# Patient Record
Sex: Female | Born: 1940 | Race: White | Hispanic: No | Marital: Single | Smoking: Never smoker
Health system: Southern US, Community
[De-identification: ages and names within clinical notes are randomized; demographics above are authoritative.]

## PROBLEM LIST (undated history)

## (undated) DIAGNOSIS — D369 Benign neoplasm, unspecified site: Secondary | ICD-10-CM

## (undated) DIAGNOSIS — I499 Cardiac arrhythmia, unspecified: Secondary | ICD-10-CM

## (undated) DIAGNOSIS — IMO0001 Reserved for inherently not codable concepts without codable children: Secondary | ICD-10-CM

## (undated) DIAGNOSIS — Z972 Presence of dental prosthetic device (complete) (partial): Secondary | ICD-10-CM

## (undated) DIAGNOSIS — I10 Essential (primary) hypertension: Secondary | ICD-10-CM

## (undated) DIAGNOSIS — N189 Chronic kidney disease, unspecified: Secondary | ICD-10-CM

## (undated) DIAGNOSIS — F419 Anxiety disorder, unspecified: Secondary | ICD-10-CM

## (undated) DIAGNOSIS — K219 Gastro-esophageal reflux disease without esophagitis: Secondary | ICD-10-CM

## (undated) DIAGNOSIS — I509 Heart failure, unspecified: Secondary | ICD-10-CM

## (undated) DIAGNOSIS — F79 Unspecified intellectual disabilities: Secondary | ICD-10-CM

## (undated) DIAGNOSIS — I4891 Unspecified atrial fibrillation: Secondary | ICD-10-CM

## (undated) DIAGNOSIS — Z973 Presence of spectacles and contact lenses: Secondary | ICD-10-CM

## (undated) DIAGNOSIS — D649 Anemia, unspecified: Secondary | ICD-10-CM

## (undated) DIAGNOSIS — J189 Pneumonia, unspecified organism: Secondary | ICD-10-CM

## (undated) DIAGNOSIS — G2581 Restless legs syndrome: Secondary | ICD-10-CM

## (undated) DIAGNOSIS — E785 Hyperlipidemia, unspecified: Secondary | ICD-10-CM

## (undated) DIAGNOSIS — M1990 Unspecified osteoarthritis, unspecified site: Secondary | ICD-10-CM

## (undated) DIAGNOSIS — I493 Ventricular premature depolarization: Secondary | ICD-10-CM

## (undated) HISTORY — DX: Hyperlipidemia, unspecified: E78.5

## (undated) HISTORY — DX: Anemia, unspecified: D64.9

## (undated) HISTORY — PX: EYE SURGERY: SHX253

## (undated) HISTORY — DX: Unspecified osteoarthritis, unspecified site: M19.90

## (undated) HISTORY — DX: Ventricular premature depolarization: I49.3

## (undated) HISTORY — PX: MULTIPLE TOOTH EXTRACTIONS: SHX2053

## (undated) HISTORY — DX: Unspecified atrial fibrillation: I48.91

## (undated) HISTORY — PX: TONSILLECTOMY: SUR1361

## (undated) HISTORY — DX: Gastro-esophageal reflux disease without esophagitis: K21.9

## (undated) HISTORY — DX: Unspecified intellectual disabilities: F79

## (undated) HISTORY — DX: Benign neoplasm, unspecified site: D36.9

## (undated) HISTORY — PX: JOINT REPLACEMENT: SHX530

---

## 2001-01-17 ENCOUNTER — Inpatient Hospital Stay (HOSPITAL_COMMUNITY)
Admission: RE | Admit: 2001-01-17 | Discharge: 2001-01-21 | Payer: Self-pay | Admitting: Physical Medicine & Rehabilitation

## 2001-07-07 ENCOUNTER — Encounter: Payer: Self-pay | Admitting: Orthopedic Surgery

## 2001-07-07 ENCOUNTER — Inpatient Hospital Stay (HOSPITAL_COMMUNITY): Admission: AD | Admit: 2001-07-07 | Discharge: 2001-07-13 | Payer: Self-pay | Admitting: Orthopedic Surgery

## 2001-07-08 ENCOUNTER — Encounter: Payer: Self-pay | Admitting: Orthopedic Surgery

## 2001-07-12 ENCOUNTER — Encounter: Payer: Self-pay | Admitting: Orthopedic Surgery

## 2002-05-31 ENCOUNTER — Ambulatory Visit (HOSPITAL_COMMUNITY): Admission: RE | Admit: 2002-05-31 | Discharge: 2002-05-31 | Payer: Self-pay | Admitting: Internal Medicine

## 2002-11-08 ENCOUNTER — Encounter: Payer: Self-pay | Admitting: Family Medicine

## 2002-11-08 ENCOUNTER — Ambulatory Visit (HOSPITAL_COMMUNITY): Admission: RE | Admit: 2002-11-08 | Discharge: 2002-11-08 | Payer: Self-pay | Admitting: Family Medicine

## 2003-01-08 ENCOUNTER — Emergency Department (HOSPITAL_COMMUNITY): Admission: EM | Admit: 2003-01-08 | Discharge: 2003-01-09 | Payer: Self-pay | Admitting: *Deleted

## 2003-01-09 ENCOUNTER — Encounter: Payer: Self-pay | Admitting: *Deleted

## 2004-05-30 ENCOUNTER — Ambulatory Visit (HOSPITAL_COMMUNITY): Admission: RE | Admit: 2004-05-30 | Discharge: 2004-05-30 | Payer: Self-pay | Admitting: Internal Medicine

## 2004-11-02 HISTORY — PX: CATARACT EXTRACTION: SUR2

## 2004-11-02 HISTORY — PX: TOTAL HIP ARTHROPLASTY: SHX124

## 2005-05-27 ENCOUNTER — Ambulatory Visit (HOSPITAL_COMMUNITY): Admission: RE | Admit: 2005-05-27 | Discharge: 2005-05-27 | Payer: Self-pay | Admitting: Family Medicine

## 2005-06-11 ENCOUNTER — Ambulatory Visit: Payer: Self-pay | Admitting: Internal Medicine

## 2006-07-28 ENCOUNTER — Ambulatory Visit: Payer: Self-pay | Admitting: Internal Medicine

## 2006-11-24 ENCOUNTER — Ambulatory Visit (HOSPITAL_COMMUNITY): Admission: RE | Admit: 2006-11-24 | Discharge: 2006-11-24 | Payer: Self-pay | Admitting: Family Medicine

## 2008-08-16 ENCOUNTER — Ambulatory Visit (HOSPITAL_COMMUNITY): Admission: RE | Admit: 2008-08-16 | Discharge: 2008-08-16 | Payer: Self-pay | Admitting: Family Medicine

## 2009-06-21 ENCOUNTER — Encounter (INDEPENDENT_AMBULATORY_CARE_PROVIDER_SITE_OTHER): Payer: Self-pay | Admitting: *Deleted

## 2009-06-25 DIAGNOSIS — H409 Unspecified glaucoma: Secondary | ICD-10-CM

## 2009-07-27 ENCOUNTER — Encounter (INDEPENDENT_AMBULATORY_CARE_PROVIDER_SITE_OTHER): Payer: Self-pay | Admitting: *Deleted

## 2009-07-27 LAB — CONVERTED CEMR LAB
ALT: 8 units/L
Alkaline Phosphatase: 64 units/L
Cholesterol: 132 mg/dL
LDL Cholesterol: 75 mg/dL

## 2009-07-30 ENCOUNTER — Encounter (INDEPENDENT_AMBULATORY_CARE_PROVIDER_SITE_OTHER): Payer: Self-pay | Admitting: *Deleted

## 2009-07-30 ENCOUNTER — Ambulatory Visit: Payer: Self-pay | Admitting: Cardiology

## 2009-07-30 DIAGNOSIS — F79 Unspecified intellectual disabilities: Secondary | ICD-10-CM

## 2009-07-30 DIAGNOSIS — I4949 Other premature depolarization: Secondary | ICD-10-CM

## 2009-07-31 ENCOUNTER — Telehealth: Payer: Self-pay | Admitting: Cardiology

## 2009-08-02 ENCOUNTER — Ambulatory Visit (HOSPITAL_COMMUNITY): Admission: RE | Admit: 2009-08-02 | Discharge: 2009-08-02 | Payer: Self-pay | Admitting: Cardiology

## 2009-08-02 ENCOUNTER — Ambulatory Visit: Payer: Self-pay | Admitting: Cardiology

## 2009-08-02 ENCOUNTER — Encounter: Payer: Self-pay | Admitting: Cardiology

## 2009-08-06 LAB — CONVERTED CEMR LAB
Basophils Relative: 1 % (ref 0–1)
Eosinophils Absolute: 0.1 10*3/uL (ref 0.0–0.7)
Eosinophils Relative: 2 % (ref 0–5)
Ferritin: 6 ng/mL — ABNORMAL LOW (ref 10–291)
Folate: 6.4 ng/mL
Iron: 16 ug/dL — ABNORMAL LOW (ref 42–145)
Lymphocytes Relative: 21 % (ref 12–46)
Lymphs Abs: 1.2 10*3/uL (ref 0.7–4.0)
MCHC: 30.8 g/dL (ref 30.0–36.0)
MCV: 74.8 fL — ABNORMAL LOW (ref 78.0–100.0)
Monocytes Absolute: 0.4 10*3/uL (ref 0.1–1.0)
Neutro Abs: 3.9 10*3/uL (ref 1.7–7.7)
Platelets: 267 10*3/uL (ref 150–400)
RBC: 4 M/uL (ref 3.87–5.11)
Saturation Ratios: 4 % — ABNORMAL LOW (ref 20–55)
TIBC: 382 ug/dL (ref 250–470)
WBC: 5.7 10*3/uL (ref 4.0–10.5)

## 2009-08-07 ENCOUNTER — Encounter (INDEPENDENT_AMBULATORY_CARE_PROVIDER_SITE_OTHER): Payer: Self-pay | Admitting: *Deleted

## 2009-08-07 LAB — CONVERTED CEMR LAB: OCCULT 2: NEGATIVE

## 2009-08-09 ENCOUNTER — Ambulatory Visit: Payer: Self-pay | Admitting: Cardiovascular Disease

## 2009-10-04 ENCOUNTER — Encounter (INDEPENDENT_AMBULATORY_CARE_PROVIDER_SITE_OTHER): Payer: Self-pay | Admitting: *Deleted

## 2009-10-04 LAB — CONVERTED CEMR LAB
CO2: 28 meq/L
Calcium: 8.8 mg/dL
Creatinine, Ser: 1.31 mg/dL

## 2009-10-07 ENCOUNTER — Ambulatory Visit (HOSPITAL_COMMUNITY): Admission: RE | Admit: 2009-10-07 | Discharge: 2009-10-07 | Payer: Self-pay | Admitting: Family Medicine

## 2009-10-07 ENCOUNTER — Encounter (INDEPENDENT_AMBULATORY_CARE_PROVIDER_SITE_OTHER): Payer: Self-pay | Admitting: Family Medicine

## 2009-10-07 ENCOUNTER — Ambulatory Visit: Payer: Self-pay | Admitting: Cardiovascular Disease

## 2009-11-02 HISTORY — PX: COLONOSCOPY W/ POLYPECTOMY: SHX1380

## 2009-11-26 ENCOUNTER — Encounter (INDEPENDENT_AMBULATORY_CARE_PROVIDER_SITE_OTHER): Payer: Self-pay | Admitting: *Deleted

## 2009-11-29 ENCOUNTER — Ambulatory Visit (HOSPITAL_COMMUNITY): Admission: RE | Admit: 2009-11-29 | Discharge: 2009-11-29 | Payer: Self-pay | Admitting: Cardiology

## 2009-11-29 ENCOUNTER — Ambulatory Visit: Payer: Self-pay | Admitting: Cardiology

## 2010-01-03 ENCOUNTER — Ambulatory Visit: Payer: Self-pay | Admitting: Cardiology

## 2010-01-03 ENCOUNTER — Encounter (INDEPENDENT_AMBULATORY_CARE_PROVIDER_SITE_OTHER): Payer: Self-pay | Admitting: *Deleted

## 2010-01-06 ENCOUNTER — Encounter: Payer: Self-pay | Admitting: Cardiology

## 2010-02-14 ENCOUNTER — Encounter: Payer: Self-pay | Admitting: Cardiology

## 2010-02-14 ENCOUNTER — Encounter (INDEPENDENT_AMBULATORY_CARE_PROVIDER_SITE_OTHER): Payer: Self-pay | Admitting: *Deleted

## 2010-02-14 LAB — CONVERTED CEMR LAB
ALT: 12 units/L
AST: 17 units/L
Albumin: 4.4 g/dL
BUN: 15 mg/dL (ref 6–23)
Basophils Absolute: 0 10*3/uL
Basophils Relative: 1 %
Basophils Relative: 1 % (ref 0–1)
CO2: 22 meq/L (ref 19–32)
Chloride: 102 meq/L (ref 96–112)
Creatinine, Ser: 1.47 mg/dL — ABNORMAL HIGH (ref 0.40–1.20)
Eosinophils Absolute: 0.2 10*3/uL
Eosinophils Absolute: 0.2 10*3/uL (ref 0.0–0.7)
Eosinophils Relative: 3 %
Eosinophils Relative: 3 % (ref 0–5)
HCT: 35.3 % — ABNORMAL LOW (ref 36.0–46.0)
Hemoglobin: 11 g/dL — ABNORMAL LOW (ref 12.0–15.0)
Lymphs Abs: 1.3 10*3/uL (ref 0.7–4.0)
MCHC: 31.2 g/dL (ref 30.0–36.0)
MCV: 77.6 fL
MCV: 77.6 fL — ABNORMAL LOW (ref 78.0–100.0)
Monocytes Absolute: 0.6 10*3/uL
Monocytes Absolute: 0.6 10*3/uL (ref 0.1–1.0)
Monocytes Relative: 10 %
Monocytes Relative: 10 % (ref 3–12)
Neutrophils Relative %: 66 % (ref 43–77)
Potassium: 4.5 meq/L
RBC: 4.55 M/uL
RBC: 4.55 M/uL (ref 3.87–5.11)
RDW: 28.3 %
RDW: 28.3 % — ABNORMAL HIGH (ref 11.5–15.5)
Sodium: 139 meq/L (ref 135–145)
Total Protein: 7.3 g/dL
Total Protein: 7.3 g/dL (ref 6.0–8.3)
WBC: 6.2 10*3/uL

## 2010-07-17 ENCOUNTER — Encounter (INDEPENDENT_AMBULATORY_CARE_PROVIDER_SITE_OTHER): Payer: Self-pay | Admitting: *Deleted

## 2010-07-21 ENCOUNTER — Ambulatory Visit: Payer: Self-pay | Admitting: Cardiology

## 2010-07-21 DIAGNOSIS — D509 Iron deficiency anemia, unspecified: Secondary | ICD-10-CM

## 2010-08-19 ENCOUNTER — Ambulatory Visit: Payer: Self-pay | Admitting: Internal Medicine

## 2010-11-30 LAB — CONVERTED CEMR LAB
AST: 10 units/L (ref 0–37)
BUN: 16 mg/dL (ref 6–23)
Basophils Absolute: 0 10*3/uL (ref 0.0–0.1)
Calcium: 9 mg/dL (ref 8.4–10.5)
Chloride: 98 meq/L (ref 96–112)
Creatinine, Ser: 1.35 mg/dL — ABNORMAL HIGH (ref 0.40–1.20)
Eosinophils Absolute: 0.1 10*3/uL (ref 0.0–0.7)
Eosinophils Relative: 2 % (ref 0–5)
Eosinophils Relative: 3 % (ref 0–5)
HCT: 28.2 % — ABNORMAL LOW (ref 36.0–46.0)
HCT: 32.7 % — ABNORMAL LOW (ref 36.0–46.0)
Hemoglobin: 7.8 g/dL — ABNORMAL LOW (ref 12.0–15.0)
Lymphocytes Relative: 16 % (ref 12–46)
Lymphs Abs: 0.9 10*3/uL (ref 0.7–4.0)
Lymphs Abs: 1.2 10*3/uL (ref 0.7–4.0)
MCV: 77.5 fL — ABNORMAL LOW (ref 78.0–100.0)
Monocytes Relative: 8 % (ref 3–12)
Monocytes Relative: 8 % (ref 3–12)
Neutro Abs: 3.9 10*3/uL (ref 1.7–7.7)
Platelets: 303 10*3/uL (ref 150–400)
Platelets: 303 10*3/uL (ref 150–400)
RDW: 18.1 % — ABNORMAL HIGH (ref 11.5–15.5)
RDW: 18.3 % — ABNORMAL HIGH (ref 11.5–15.5)
Total Bilirubin: 0.7 mg/dL (ref 0.3–1.2)
Total Protein: 7.2 g/dL (ref 6.0–8.3)
WBC: 7.4 10*3/uL (ref 4.0–10.5)

## 2010-12-02 NOTE — Letter (Signed)
Summary: [facility], general practice at [facility]. [address], [place] [address]   Phone: [phone]  Fax: [phone]     January 03, 2010 MRN: [id number]   [address] [address] [place], [place]  [address]      YOUR LAB WORK IS DUE   ________________APRIL 15, 2011_________________________  Please go to [facility], located across the street from [facility] on the second floor.  Hours are Monday - Friday 7am until 7:30pm         Saturday 8am until 12noon    __  DO NOT EAT OR DRINK AFTER MIDNIGHT EVENING PRIOR TO LABWORK  _X_ YOUR LABWORK IS NOT FASTING --YOU MAY EAT PRIOR TO LABWORK

## 2010-12-02 NOTE — Assessment & Plan Note (Signed)
Summary: f71m   Visit Type:  Follow-up Referring Provider:  GI-Dr. Laural Golden Primary Provider:  Jodi Mourning Mcinnis   History of Present Illness: Return visit for this for a pleasant 70 year old woman with newly diagnosed atrial fibrillation.  Since her last visit, she has not done particularly well.  She notes fullness in her ear is that resolves after she consumes water.  She has decreased exercise tolerance and exertional dyspnea but no chest discomfort.  She has not had GI symptoms, but is followed by Dr.Rehman for severe gastroesophageal reflux disease.  She last had a colonoscopy approximately 4--5 years ago.  Recent laboratory studies showed a moderately severe anemia with iron, iron-binding and ferritin consistent with iron deficiency.  Stool for Hemoccult was negative on 3 occasions.  She has no history of clinical bleeding.  She notes no melena.  She has had chronic nausea with some emesis but no hematemesis.  Pedal edema is improved with the use of diuretics.  There is no orthopnea nor PND.  She has lost 15 pounds with caloric and salt restriction.   Current Medications (verified): 1)  Nexium 40 Mg Cpdr (Esomeprazole Magnesium) .... Take 1 Tab Two Times A Day 2)  Furosemide 20 Mg Tabs (Furosemide) .... Take 2 Two Times A Day 3)  Allopurinol 100 Mg Tabs (Allopurinol) .... Take 1 Tab Daily 4)  Warfarin Sodium 5 Mg Tabs (Warfarin Sodium) .... Take As Directed By [facility] 5)  Xalatan 0.005 % Soln (Latanoprost) .Marland Kitchen.. 1 Drop Each Eye At Bedtime 6)  Dorzolamide Hcl 2 % Soln (Dorzolamide Hcl) .Marland Kitchen.. 1 Drop Each Eye Two Times A Day 7)  Metoprolol Succinate 200 Mg Xr24h-Tab (Metoprolol Succinate) .... Take One Tablet By Mouth in The Morning and One Half Tablet By Mouth in The Evening 8)  K-Lor 20 Meq Pack (Potassium Chloride) .... Take 1 Tab Daily 9)  Promethazine Hcl 25 Mg/ml Soln (Promethazine Hcl) .... Take As Needed 10)  Tramadol Hcl 50 Mg Tabs (Tramadol Hcl) .... Take As Needed For  Pain 11)  Ferrous Sulfate 324 Mg Tabs (Ferrous Sulfate) .... Take 1 Tablet By Mouth Two Times A Day With Meals 12)  Lisinopril 20 Mg Tabs (Lisinopril) .... Take One Tablet By Mouth Daily  Allergies (verified): No Known Drug Allergies  Past History:  PMH, FH, and Social History reviewed and updated.  Family History: Father: deceased due to brain aneurysm Mother:died as a result of coronary artery disease Siblings: 2 with heart disease, one of them deceased; one with scoliosis; 2 brothers are alive and well. Additional positive family history in grandparents for neoplastic disease, chronic kidney disease, cirrhosis and myocardial infarction.  Review of Systems       The patient complains of weight loss, decreased hearing, dyspnea on exertion, and peripheral edema.  The patient denies anorexia, fever, vision loss, hoarseness, chest pain, syncope, prolonged cough, headaches, hemoptysis, abdominal pain, melena, hematochezia, and severe indigestion/heartburn.    Vital Signs:  Patient profile:   70 year old female Weight:      192 pounds Pulse rate:   130 / minute BP sitting:   124 / 73  (right arm)  Vitals Entered By: Doretha Sou, CNA (November 29, 2009 12:48 PM)  Physical Exam  General:   General-Well-developed; no acute distress: speech is easier to understand at this visit HEENT-[place]/AT; PERRL; EOM intact; conjunctiva and lids nl: right eye extorted Neck-No JVD; no carotid bruits; minimal if any HJR Lungs-No tachypnea, clear without rales, rhonchi or wheezes; decreased breath sounds  at the bases CV-normal PMI; normal S1 and S2: rapid and irregular rhythm;  Abdomen-BS normal; soft and non-tender without masses or organomegaly: MS-No deformities, cyanosis or clubbing: Neurologic-Nl cranial nerves; symmetric strength and tone: Skin- Warm, no sig. lesions: Extremities-Nl distal pulses;1/2 edema    Impression & Recommendations:  Problem # 1:  ANEMIA (ICD-285.9) She has a fairly  pronounced anemia with iron studies strongly suggestive of iron deficiency.  Dr. Laural Golden is her gastroenterologist.  She will likely require repeat upper and lower endoscopy.  I have added iron to her medical regime and will refer her back to him for further evaluation.  Problem # 2:  ATRIAL FIBRILLATION (ICD-427.31) Heart rate remains suboptimally controlled.  Metoprolol tartrate will be changed to metoprolol succinate at a total dose of 300 mg per day.  We will continue to monitor heart rate and blood pressure.  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  The followup study showed improvement with only mildly reduced ejection fraction.  This is consistent with a rate-related cardiomyopathy that has improved with better control of heart rate.  Of course, there could be an underlying heart muscle problem as well, either ischemic or nonischemic.  For now, we will continue to treat her medically, but she may ultimately require stress testing or cardiac catheterization.  An ACE inhibitor will be added to her medical regime.  Due to a substantial dose of diuretic, a chemistry profile will be checked.     A BNP level and chest x-ray will also be obtained  I will reassess this nice woman in one month.  Other Orders: T-CBC w/Diff [id number]) T-Comprehensive Metabolic Panel ([id number]) T-BNP  (B Natriuretic Peptide) [id number]) T-Chest x-ray, 2 views (71020) Misc. Referral (Misc. Ref) Gastroenterology Referral (GI)   Patient Instructions: 1)  Your physician recommends that you schedule a follow-up appointment in: 1 MONTH 2)  Your physician recommends that you return for lab work in: TODAY 3)  Your physician has recommended you make the following change in your medication:  IRON 1 TABLET BY MOUTH TWICE A DAY, CHANGE METOPROLOL TO METOPROL SUCCINATE 200MG  ONE TABLET BY MOUTH IN MORNING AND ONE HALF IN THE EVENING,  LISINOPRIL 20MG  DAILY 4)  You have been referred to DR Bath AND IRON DEFICIENCY ANEMIA Prescriptions: LISINOPRIL 20 MG TABS (LISINOPRIL) Take one tablet by mouth daily  #30 x 6   Entered by:   Tye Savoy RN   Authorized by:   Yehuda Savannah, MD, [facility]   Signed by:   Tye Savoy RN on 11/29/2009   Method used:   Electronically to        [facility] (retail)       [place], [place]  [address]       Ph: [phone]       Fax: [phone]   RxID:   [id number] METOPROLOL SUCCINATE 200 MG XR24H-TAB (METOPROLOL SUCCINATE) take one tablet by mouth in the morning and one half tablet by mouth in the evening  #45 x 6   Entered by:   Tye Savoy RN   Authorized by:   Yehuda Savannah, MD, [facility]   Signed by:   Tye Savoy RN on 11/29/2009   Method used:   Electronically to        [facility] (retail)  [place], [place]  [address]       Ph: [phone]       Fax: [phone]   RxID:   [id number] FERROUS SULFATE 324 MG TABS (FERROUS SULFATE) Take 1 tablet by mouth two times a day with meals  #60 x 6   Entered by:   Tye Savoy RN   Authorized by:   Yehuda Savannah, MD, [facility]   Signed by:   Tye Savoy RN on 11/29/2009   Method used:   Electronically to        [facility] (retail)       [address]       [place], [place]  [address]       Ph: [phone]       Fax: [phone]   RxID:   [id number]

## 2010-12-02 NOTE — Assessment & Plan Note (Signed)
Summary: 1 MTH F/U PER CHECKOUT ON 11/29/09/TG   Visit Type:  Follow-up Referring Provider:  GI-Dr. Laural Golden Primary Provider:  Jodi Mourning Mcinnis   History of Present Illness: Return visit is planned for this very nice woman with atrial fibrillation and iron deficiency anemia.  Since her last visit, she developed a bronchitis and an associated GI illness with nausea and diarrhea.  She has recovered from those symptoms and now feels better than she has for some time.  A repeat CBC by Dr. Emilee Hero apparently showed a substantial increase in hemoglobin to values above 9, whereas the prior value in my office was 7.8.  Ms. Chiaramonte currently says that she feels better than she has for quite some time.  She has been evaluated in the office by Dr. Laural Golden, who plans to do colonoscopy in the near future.  She will temporarily interrupt anticoagulation and supplemental iron for this procedure.  Patient continues to restrict calories in a voluntary attempt to lose weight, which has been astoundingly successful.  She is now down more than 20 pounds from her peak.  She denies anorexia or any GI symptoms at present.  EKG  Procedure date:  01/03/2010  Findings:      Rhythm Strip  Atrial fibrillation with controlled ventricular response Heart rate of 90 bpm   Current Medications (verified): 1)  Nexium 40 Mg Cpdr (Esomeprazole Magnesium) .... Take 1 Tab Two Times A Day 2)  Furosemide 20 Mg Tabs (Furosemide) .... Take 2 Two Times A Day 3)  Allopurinol 100 Mg Tabs (Allopurinol) .... Take 1 Tab Daily 4)  Warfarin Sodium 5 Mg Tabs (Warfarin Sodium) .... Take As Directed By [facility] 5)  Xalatan 0.005 % Soln (Latanoprost) .Marland Kitchen.. 1 Drop Each Eye At Bedtime 6)  Dorzolamide Hcl 2 % Soln (Dorzolamide Hcl) .Marland Kitchen.. 1 Drop Each Eye Two Times A Day 7)  Metoprolol Succinate 200 Mg Xr24h-Tab (Metoprolol Succinate) .... Take One Tablet By Mouth in The Morning and One Half Tablet By Mouth in The Evening 8)  K-Lor 20 Meq  Pack (Potassium Chloride) .... Take 1 Tab Daily 9)  Promethazine Hcl 25 Mg/ml Soln (Promethazine Hcl) .... Take As Needed 10)  Tramadol Hcl 50 Mg Tabs (Tramadol Hcl) .... Take As Needed For Pain 11)  Lisinopril 20 Mg Tabs (Lisinopril) .... Take One Tablet By Mouth Daily 12)  Ayr 0.65 % Soln (Saline) .... Use As Needed  Allergies (verified): No Known Drug Allergies  Past History:  PMH, FH, and Social History reviewed and updated.  Review of Systems       The patient complains of weight loss and decreased hearing.  The patient denies anorexia, hoarseness, chest pain, syncope, dyspnea on exertion, peripheral edema, prolonged cough, headaches, hemoptysis, abdominal pain, melena, and hematochezia.    Vital Signs:  Patient profile:   70 year old female Weight:      178 pounds Pulse rate:   73 / minute BP sitting:   112 / 72  (right arm)  Vitals Entered By: Doretha Sou, CNA (January 03, 2010 1:07 PM)  Physical Exam  General:   Mildly overweight well-developed; no acute distress: speech is easier to understand at this visit; the voice seems mildly hoarse, but daughters indicates that this is her usual phonation HEENT-[place]/AT; PERRL; EOM intact; conjunctiva and lids nl: right eye extorted Neck-No JVD; no carotid bruits; no thyromegaly Lungs-No tachypnea, clear without rales, rhonchi or wheezes; decreased breath sounds at the bases; mild kyphosis  CV-normal PMI; normal S1  and S2: irregular rhythm;  Abdomen-BS normal; soft and non-tender without masses or organomegaly: MS-No deformities, cyanosis or clubbing: Neurologic-Nl cranial nerves; symmetric strength and tone: Skin- Warm, no sig. lesions: Extremities-Nl distal pulses;1/2 edema    Impression & Recommendations:  Problem # 1:  ANEMIA, IRON DEFICIENCY (ICD-280.9) It appears that she has had a superb response to oral iron.  We will reassess a CBC in 6 weeks.  Dr. Laural Golden is investigating for GI blood loss.  Problem # 2:  ATRIAL  FIBRILLATION (ICD-427.31) Her rate control is improved.  Patient is asymptomatic from a cardiopulmonary standpoint.  We will continue current therapy including anticoagulation with monitoring on INR by her primary care physician.  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  Medications are appropriate for her asymptomatic cardiomyopathy and will be continued.  I will plan to see this woman again in 6 months.  Will monitor her laboratory in the interim.  Other Orders: Future Orders: T-Comprehensive Metabolic Panel ([id number]) ... 02/14/2010 T-CBC w/Diff [id number]) ... 02/14/2010  Patient Instructions: 1)  Your physician recommends that you schedule a follow-up appointment in: 6 months 2)  Your physician recommends that you return for lab work in: 6 weeks

## 2010-12-02 NOTE — Miscellaneous (Signed)
Summary: CBCD,CMP,02/14/2010  Clinical Lists Changes  Observations: Added new observation of CALCIUM: 9.1 mg/dL (02/14/2010 15:18) Added new observation of ALBUMIN: 4.4 g/dL (02/14/2010 15:18) Added new observation of PROTEIN, TOT: 7.3 g/dL (02/14/2010 15:18) Added new observation of SGPT (ALT): 12 units/L (02/14/2010 15:18) Added new observation of SGOT (AST): 17 units/L (02/14/2010 15:18) Added new observation of ALK PHOS: 107 units/L (02/14/2010 15:18) Added new observation of CREATININE: 1.47 mg/dL (02/14/2010 15:18) Added new observation of BUN: 15 mg/dL (02/14/2010 15:18) Added new observation of BG RANDOM: 52 mg/dL (02/14/2010 15:18) Added new observation of CO2 PLSM/SER: 22 meq/L (02/14/2010 15:18) Added new observation of CL SERUM: 102 meq/L (02/14/2010 15:18) Added new observation of K SERUM: 4.5 meq/L (02/14/2010 15:18) Added new observation of NA: 139 meq/L (02/14/2010 15:18) Added new observation of ABSOLUTE BAS: 0.0 K/uL (02/14/2010 15:18) Added new observation of BASOPHIL %: 1 % (02/14/2010 15:18) Added new observation of EOS ABSLT: 0.2 K/uL (02/14/2010 15:18) Added new observation of % EOS AUTO: 3 % (02/14/2010 15:18) Added new observation of ABSOLUTE MON: 0.6 K/uL (02/14/2010 15:18) Added new observation of MONOCYTE %: 10 % (02/14/2010 15:18) Added new observation of ABS LYMPHOCY: 1.3 K/uL (02/14/2010 15:18) Added new observation of LYMPHS %: 21 % (02/14/2010 15:18) Added new observation of PLATELETK/UL: 246 K/uL (02/14/2010 15:18) Added new observation of RDW: 28.3 % (02/14/2010 15:18) Added new observation of MCHC RBC: 31.2 g/dL (02/14/2010 15:18) Added new observation of MCV: 77.6 fL (02/14/2010 15:18) Added new observation of HCT: 35.3 % (02/14/2010 15:18) Added new observation of HGB: 11.0 g/dL (02/14/2010 15:18) Added new observation of RBC M/UL: 4.55 M/uL (02/14/2010 15:18) Added new observation of WBC COUNT: 6.2 10*3/microliter (02/14/2010 15:18)

## 2010-12-02 NOTE — Assessment & Plan Note (Signed)
Summary: 6 mth f/u per checkout on 01/03/10/tg   Visit Type:  Follow-up Referring [name]:  GI-Dr. Laural Orr Primary [name]:  [name]   History of Present Illness: [name] returns to the office as scheduled for continued assessment and treatment of atrial fibrillation and followup of iron deficiency anemia.  Since her last visit, she was evaluated by Dr. Laural Orr with upper and lower endoscopy, which revealed multiple polyps that were resected.  Hemoglobin has returned to near normal with oral iron therapy, and the patient feels great.  She denies dyspnea, chest pain, orthopnea, PND, lightheadedness or syncope.  She has had no abdominal discomfort, nausea or hematochezia.  Current Medications (verified): 1)  Nexium 40 Mg Cpdr (Esomeprazole Magnesium) .... Take 1 Tab Two Times A Day 2)  Furosemide 20 Mg Tabs (Furosemide) .... Take 2 Two Times A Day 3)  Allopurinol 100 Mg Tabs (Allopurinol) .... Take 1 Tab Daily 4)  Warfarin Sodium 5 Mg Tabs (Warfarin Sodium) .... Take As Directed By [facility] 5)  Xalatan 0.005 % Soln (Latanoprost) .Marland Kitchen.. 1 Drop Each Eye At Bedtime 6)  Dorzolamide Hcl 2 % Soln (Dorzolamide Hcl) .Marland Kitchen.. 1 Drop Each Eye Two Times A Day 7)  Metoprolol Succinate 200 Mg Xr24h-Tab (Metoprolol Succinate) .... Take One Tablet By Mouth in The Morning and One Half Tablet By Mouth in The Evening 8)  K-Lor 20 Meq Pack (Potassium Chloride) .... Take 1 Tab Daily 9)  Promethazine Hcl 25 Mg/ml Soln (Promethazine Hcl) .... Take As Needed 10)  Tramadol Hcl 50 Mg Tabs (Tramadol Hcl) .... Take As Needed For Pain 11)  Lisinopril 20 Mg Tabs (Lisinopril) .... Take One Tablet By Mouth Daily 12)  Ayr 0.65 % Soln (Saline) .... Use As Needed 13)  Ferrous Gluconate 324 (38 Fe) Mg Tabs (Ferrous Gluconate) .... Take 1 Tab Two Times A Day  Allergies (verified): No Known Drug Allergies  Past History:  PMH, FH, and Social History reviewed and updated.  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  Vital Signs:  Patient profile:   70 year old female Weight:      210 pounds BMI:     32.05 Pulse rate:   95 / minute BP sitting:   122 / 78  (right arm)  Vitals Entered By: Doretha Sou, CNA (July 21, 2010 2:36 PM)  Physical Exam  General:  Moderately overweight well-developed; no acute distress; alert and appropriately communicative Heart rate is 84 bpm taken apically HEENT: right eye extorted Neck-No JVD; no carotid bruits; no thyromegaly Lungs-No tachypnea, clear without rales, rhonchi or wheezes; decreased breath sounds at the bases; mild kyphosis; poor respiratory effort CV-distant S1 and S2: irregular rhythm; Abdomen-BS normal; soft and non-tender without masses or organomegaly: MS-No deformities, cyanosis or clubbing: Neurologic-Nl cranial nerves; symmetric strength and tone: Skin- Warm, no sig. lesions: Extremities-Nl distal pulses;1/2+ edema; superficial varicosities    Impression & Recommendations:  Problem # 1:  ANEMIA, IRON DEFICIENCY (ICD-280.9) Anemia is markedly improved with hemoglobin up to 11 and hematocrit to 35.3.  MCV remains slightly low.  Dr. Laural Orr recommends indefinite iron replacement therapy.  Hemoccult testing of stools will be repeated.  Problem # 2:  ATRIAL FIBRILLATION (ICD-427.31) Heart rate is controlled and anticoagulation well tolerated.  Current medications will be continued.  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  In any case, she is doing well with medical therapy of impaired LV systolic function, whether mild or moderate.  Problem # 4:  DYSLIPIDEMIA (ICD-272.4) Lipid profile was excellent in 07/2009 as noted below, despite her not taking lipid-lowering medication.  In the absence of known vascular disease, no further assessment or treatment is warranted.  CHOL: 132 (07/27/2009)   LDL: 75 (07/27/2009)   HDL: 43 (07/27/2009)   TG: 69 (07/27/2009)  I will plan to reassess this nice woman in the office in 8 months.  Echocardiogram  Procedure date:  10/07/2009  Findings:      Left ventricle-global hypokinesis; LV systolic function difficult to judge due to irregular rhythm; septum and anterolateral hypokinesis most impressive; EF of approximately 45%.  Mild LV enlargement; severe LVH.  Mild left atrial enlargement. Mild right ventricular dilatation Trivial paracardial effusion   Patient Instructions: 1)  Your physician recommends that you schedule a follow-up appointment in: 8 MONTHS 2)  Your physician has asked that you test your stool for blood. It is necessary to test 3 different stool specimens for accuracy. You will be given 3 hemoccult cards for specimen collection. For each stool specimen, place a small portion of stool sample (from 2 different areas of the stool) into the 2 squares on the card. Close card. Repeat with 2 more stool specimens. Bring the cards back to the office for testing. 3)  Your physician encouraged you to lose weight for better health.

## 2010-12-02 NOTE — Miscellaneous (Signed)
Summary: LABS PT,INR,BMP, 10/04/2009  Clinical Lists Changes  Observations: Added new observation of CALCIUM: 8.8 mg/dL (10/04/2009 9:08) Added new observation of CREATININE: 1.31 mg/dL (10/04/2009 9:08) Added new observation of BUN: 13 mg/dL (10/04/2009 9:08) Added new observation of BG RANDOM: 98 mg/dL (10/04/2009 9:08) Added new observation of CO2 PLSM/SER: 28 meq/L (10/04/2009 9:08) Added new observation of CL SERUM: 99 meq/L (10/04/2009 9:08) Added new observation of K SERUM: 3.8 meq/L (10/04/2009 9:08) Added new observation of NA: 136 meq/L (10/04/2009 9:08) Added new observation of INR: 2.11  (10/04/2009 9:08) Added new observation of PT PATIENT: 23.5 s (10/04/2009 9:08)

## 2011-03-20 NOTE — Op Note (Signed)
NAME:  [name], [name]                           ACCOUNT NO.:  [id number]   MEDICAL RECORD NO.:  [id number]                   PATIENT TYPE:  AMB   LOCATION:  DAY                                  FACILITY:  [facility]   PHYSICIAN:  Hildred Laser, M.D.                 DATE OF BIRTH:  October 17, 1941   DATE OF PROCEDURE:  05/30/2004  DATE OF DISCHARGE:                                 OPERATIVE REPORT   PROCEDURE:  Esophagogastroduodenoscopy.   INDICATIONS:  Alleyne is a 70 year old Caucasian female with chronic GERD,  complicated by Barrett's esophagus who is here for surveillance colonoscopy.  She is on anti-reflux measures and double dose PPI and doing fairly well.  She denies dysphagia.  Her last EGD biopsy was in July 2003.   Procedure risks were reviewed with the patient and her sister and informed  consent was obtained from her sister, Mrs. [name].   PREMEDICATION:  Cetacaine spray for pharyngeal topical anesthesia, Demerol  50 mg IV, Versed 6 mg IV.   FINDINGS:  The procedures were performed in the endoscopy suite.  The  patient's vital signs and O2 saturation were monitored during the procedure  and remained stable.   The patient was placed in the left lateral recumbent position and Olympus  videoscope was placed through the oropharynx without any difficulty into the  esophagus.   Esophagus:  Mucosa of the esophagus was normal.  Proximal margin of  Barrett's esophagus is at 33 cm from the incisors which was same as on  previous exam.  The Barrett's segment was very difficult to see because of  tortuosity of this segment.  There was no ulceration or mass.  The GE  junction was estimated to be at 37 cm.  Hiatal hernia was at 41-42 cm.  Moderate size hernia with dependent segment on the left side.  There was  some focal erythema.  No ring or stricture was noted.   Stomach:  It was empty and distended very well on insufflation.  Folds of  the proximal stomach were normal.   Examination of the mucosa and body,  antrum and pyloric channel as well as  angularis was normal.  Hernia was  easily seen on retroflexed view.   Duodenum:  Examination of the bulb and second part of the duodenum was  normal.  revealed normal mucosa.  Scope was passed to the second part of the  duodenum.  Mucosa and folds were normal.   The endoscope was pulled back in the esophagus and multiple biopsies were  taken from Barrett's esophagus.  The length was estimated to be 4 cm  although it was difficult to be precise because of tortuosity of this  segment.  On previous exam, it was felt to be 5 cm long.  As noted above the  proximal margin, however, was right at 33 cm.   The endoscope was withdrawn.  The patient tolerated the procedure well.   FINAL DIAGNOSIS:  1. A 4 cm length Barrett's esophagus without obvious mass or ulceration.  2. Moderate size sliding hiatal hernia.  3. Proximal margin of Barrett's was at 33 cm from the incisors.  Hiatus was     at 41-42 and GE junction was estimated to be 37.  4. Moderate size sliding hiatal hernia.  5. Normal exam of the stomach and first and second partum of the duodenum.   RECOMMENDATIONS:  She will continue anti-reflux measures and Nexium at 40 mg  b.i.d.  New prescription given for a month with 11 refills.  I will be  contacting the patient's sister with biopsy results.      ___________________________________________                                            Hildred Laser, M.D.   NR/MEDQ  D:  05/30/2004  T:  05/30/2004  Job:  [id number]   cc:   Estill Bamberg. Karie Kirks, M.D.  [address] [place], [place] [address]  Fax: [phone]

## 2011-03-20 NOTE — Discharge Summary (Signed)
[facility]  Patient:    [name], [name]                     MRN: [id number] Adm. Date:  [id number] Disc. Date: [id number] Attending:  Alger Simons T Dictator:   Junius Argyle, PA CC:         Marjean Donna, M.D.  Dr. Aline Brochure   Discharge Summary  DISCHARGE DIAGNOSES: 1. Status post right hip bipolar hemiarthroplasty. 2. Postoperative anemia. 3. Hypokalemia, supplemented for. 4. History of gastroesophageal reflux disease. 5. Glaucoma.  HISTORY OF PRESENT ILLNESS:  The patient is a 70 year old female with a history of GERD and glaucoma, who fell on March 11, sustaining a right femoral neck fracture, evaluated by Dr. Aline Brochure and Dr. Everette Rank at [facility] and underwent right hip bipolar, March 12 by Dr. Aline Brochure.  Postop is weightbearing as tolerated and on Lovenox with DVT prophylaxis.  She is able to follow simple commands with constant cueing for all activity.  Currently is min assist for transfers, min assist ambulating 40 feet, requires cueing for total hip precautions.  PAST MEDICAL HISTORY:  Significant for GERD, hital hernia, glaucoma, and MR.  ALLERGIES:  No known drug allergies.  SOCIAL HISTORY:  The patient lives with mother and was independent prior to admission.  She does not use any alcohol or tobacco.  Was active and family is very supportive.  HOSPITAL COURSE:  The patient was admitted to rehabilitation on January 17, 2001, for inpatient therapies to consist of PT/OT daily.  Past admission, a ______  then duplex were done showing no evidence of DVT.  She has been maintained on subcutaneous Lovenox for DVT prophylaxis.  The laboratory data on admission showed patient to be mildly hyperkalemic, good potassium at 3.4.  She was supplemented x 2 days.  Patient was noted to be anemic with H&H of 9.2, 27.7.  A recheck 48 hours later shows stability with hemoglobin 9.4, hematocrit 28.4.  The patients hip incision has  been healing well without any signs or symptoms of infection, no drainage, no erythema noted.  Staples remain intact. ________ therapy.  She is able to recall 3/3 total hip precautions.  She is currently modified independent for transfers, requires supervision for ambulating 20-60 feet.  She is at supervision for upper body care, requires min assist for lower body care without assistive device. Twenty-four hour supervision is recommended past discharge and family is able to provide this.  On January 21, 2001, the patient is discharged to home.  DISCHARGE MEDICATIONS: 1. Trinsicon 1 p.o. b.i.d. 2. Xalatan 0.005% one GTT OU q.h.s. 3. Timoptic 0.5% ophthalmic solution one GTT b.i.d. 4. Nexium 40 mg per day. 5. Domperidone 10 mg t.i.d. 6. Darvocet-N 100 1-2 p.o. q.4-6h. p.r.n. pain. 7. Coated aspirin 325 mg p.o. per day.  ACTIVITY:  Weightbearing as tolerated, use walker, follow total hip precautions.  DIET:  Regular.  WOUND CARE:  Keep area clean and dry.  SPECIAL INSTRUCTIONS:  No alcohol, no smoking, no driving.  Patient to receive continued home health PT by [facility].  FOLLOWUP:  The patient is to follow up with Dr. Aline Brochure in the next 3-5 days for staple removal.  Follow up with Dr. Naaman Plummer as needed. DD:  01/20/01 TD:  01/21/01 Job: [id number]

## 2011-03-20 NOTE — Op Note (Signed)
NAME:  [name], [name]                           ACCOUNT NO.:  [id number]   MEDICAL RECORD NO.:  [id number]                   PATIENT TYPE:  AMB   LOCATION:  DAY                                  FACILITY:  [facility]   PHYSICIAN:  Hildred Laser, M.D.                 DATE OF BIRTH:  06-Jun-1941   DATE OF PROCEDURE:  05/31/2002  DATE OF DISCHARGE:  05/31/2002                                 OPERATIVE REPORT   PROCEDURE:  Esophagogastroduodenoscopy with esophageal biopsy.   INDICATION:  The patient is a 70 year old Caucasian female with complicated  GERD.  She has 5 cm long Barrett's segment.  She underwent surveillance  esophagogastroduodenoscopy in March 2003,at [place].  Biopsies were felt to have  dysplasia.  Second opinion was sought from [facility], and it was felt because  of underlying inflammation it was difficult to be sure.  Since then, she has  been maintained on Nexium 40 mg b.i.d. and she is also on domperidone.  She  is undergoing surveillance exam.   While she does not have dysphagia, she states she has frequent heartburn.  Her sister tells me she drinks Pepsi all the time.  Since her hip surgery,  she has not been able to walk and has gained over 20 lbs.  Procedure and  risks were reviewed with patient's sister.  Informed consent was obtained  from her sister who was in the room with the patient during the procedure in  order to calm her down.  Please note, the patient also has mild mental  retardation.   PREOPERATIVE MEDICATIONS:  Cetacaine spray for pharyngeal topical  anesthesia, Demerol 50 mg IV, Versed 5 mg IV in divided dose.   INSTRUMENT:  Olympus video system.   SURGEON:  Hildred Laser, M.D.   FINDINGS:  Procedure performed in endoscopy suite.  The patient's vital  signs and O2 sat were monitored during the procedure and remained stable.  The patient was placed in left lateral position, and the scope was passed  through oropharynx without any difficulty into  esophagus.   Mucosa of the proximal and middle segment was normal.  Proximal margin of  Barrett's segment was noted at 33 cm.  There was incomplete ring formation  at this level.  This was wide open.  This was taken for the record and part  of her data base.  The distal margin of Barrett's segment was felt to be at  38 cm below which was a moderate to large sliding hiatal hernia.  The hernia  length was only 5 cm.  However, it had fairly large dependent segment on the  left side.  Diaphragmatic hiatus was at 43 cm.  The Barrett's mucosa did not  reveal any ulceration or a mass.  Multiple biopsies are taken at the end of  the procedure.  Biopsies were also taken from a slightly depressed area at 6  o'clock, which was the junction of squamous and Barrett's mucosa.   Stomach was empty and distended very well with insufflation.  Folds of the  proximal stomach were normal.  Examination of mucosa at body, antrum,  pyloric channel, as well as angularis of the fundus, was normal.   Duodenal exam showed bulb and second part of the duodenum were also normal.   As indicated above, multiple biopsies are taken from Barrett's mucosa.  Endoscope was withdrawn.   The patient tolerated the procedure well.   FINAL DIAGNOSES:  1. A 5 cm long tubular Barrett's segment.  No endoscopic evidence of     ulceration or mass.  Biopsies taken from two different sites as noted     above.  2. Moderate to large sliding hiatal hernia, which would explain difficulty     in controlling her symptoms.  3. Normal examination of stomach, first and second part of duodenum.   RECOMMENDATIONS:  Antireflux measures reinforced.  For now, she will  continue Nexium at 40 mg b.i.d. and domperidone 10 mg for each meal.  I will  contact sister with biopsy results and further recommendations.                                               Hildred Laser, M.D.    NR/MEDQ  D:  05/31/2002  T:  06/05/2002  Job:  [id number]   cc:    Hildred Laser, M.D.   Leslie Andrea

## 2011-03-23 DIAGNOSIS — D369 Benign neoplasm, unspecified site: Secondary | ICD-10-CM | POA: Insufficient documentation

## 2011-03-23 DIAGNOSIS — M1990 Unspecified osteoarthritis, unspecified site: Secondary | ICD-10-CM | POA: Insufficient documentation

## 2011-03-23 DIAGNOSIS — M109 Gout, unspecified: Secondary | ICD-10-CM | POA: Insufficient documentation

## 2011-03-23 DIAGNOSIS — K219 Gastro-esophageal reflux disease without esophagitis: Secondary | ICD-10-CM | POA: Insufficient documentation

## 2011-03-24 ENCOUNTER — Encounter: Payer: Self-pay | Admitting: Cardiology

## 2011-03-24 ENCOUNTER — Ambulatory Visit (INDEPENDENT_AMBULATORY_CARE_PROVIDER_SITE_OTHER): Payer: Medicare Other | Admitting: Cardiology

## 2011-03-24 DIAGNOSIS — E785 Hyperlipidemia, unspecified: Secondary | ICD-10-CM | POA: Insufficient documentation

## 2011-03-24 DIAGNOSIS — I4891 Unspecified atrial fibrillation: Secondary | ICD-10-CM

## 2011-03-24 NOTE — Assessment & Plan Note (Signed)
Atrial fibrillation persists with good control of heart rate.  Anticoagulation has been variable of late without any change in her medical therapy or diet.  I discussed the option of treatment with dabigatran with the patient's family.  For now, they elect to continue warfarin with dosing managed by Dr. Everette Rank.  She has had no apparent congestive heart failure despite mildly impaired left ventricular systolic function when last assessed.

## 2011-03-24 NOTE — Progress Notes (Signed)
HPI : Older woman returning as scheduled with the assistance  family members for continued assessment and treatment of chronic atrial fibrillation and iron deficiency anemia.  Since her last visit, she has done quite well.  Family reports disrupted sleep schedule and daytime somnolence.  Modest snoring has also been noted.  With iron therapy and GI care from Dr. Laural Golden anemia virtually resolved by the most recent assessment available to me in late 2011.  Patient currently denies chest discomfort, dyspnea, syncope, falls, abdominal pain or change in bowel habit.  She walks with a walker and lifestyle is quite sedentary.  Current Outpatient Prescriptions on File Prior to Visit  Medication Sig Dispense Refill  . allopurinol (ZYLOPRIM) 100 MG tablet Take 100 mg by mouth daily.        . dorzolamide (TRUSOPT) 2 % ophthalmic solution Place 1 drop into both eyes 2 (two) times daily.        Marland Kitchen esomeprazole (NEXIUM) 40 MG capsule Take 40 mg by mouth daily before breakfast.        . ferrous gluconate (FERGON) 324 MG tablet Take 324 mg by mouth daily with breakfast.        . furosemide (LASIX) 20 MG tablet Take 20 mg by mouth 2 (two) times daily.        Marland Kitchen latanoprost (XALATAN) 0.005 % ophthalmic solution Place 1 drop into both eyes at bedtime.        Marland Kitchen lisinopril (PRINIVIL,ZESTRIL) 20 MG tablet Take 20 mg by mouth daily.        . metoprolol (TOPROL-XL) 200 MG 24 hr tablet Take 200 mg by mouth 2 (two) times daily. 1 tab am 1/2 tab pm       . potassium chloride SA (K-DUR,KLOR-CON) 20 MEQ tablet Take 20 mEq by mouth daily.        . promethazine (PHENERGAN) 25 MG tablet Take 25 mg by mouth every 6 (six) hours as needed.        . sodium chloride (OCEAN) 0.65 % nasal spray 1 spray by Nasal route as needed.        . traMADol (ULTRAM) 50 MG tablet Take 50 mg by mouth every 6 (six) hours as needed.        . warfarin (COUMADIN) 5 MG tablet Take 5 mg by mouth daily.           No Known Allergies    Past medical history,  social history, and family history reviewed and updated.  ROS: See history of present illness.  PHYSICAL EXAM: BP 113/75  Pulse 144  Ht 5\' 9"  (1.753 m)  Wt 208 lb (94.348 kg)  BMI 30.72 kg/m2  SpO2 92%  General-Well developed; no acute distress; somnolent Body habitus-moderately overweight Neck-No JVD; no carotid bruits Lungs-clear lung fields; resonant to percussion Cardiovascular-irregular rhythm;normal PMI; distant S1 and S2 Abdomen-normal bowel sounds; soft and non-tender without masses or organomegaly Musculoskeletal-No deformities, no cyanosis or clubbing Neurologic-Normal cranial nerves; symmetric strength and tone Skin-Warm, no significant lesions Extremities-distal pulses intact; trace edema  ASSESSMENT AND PLAN:

## 2011-03-24 NOTE — Patient Instructions (Signed)
Your physician recommends that you schedule a follow-up appointment in: 9 months Stool cards- follow instructions in packet

## 2011-03-24 NOTE — Assessment & Plan Note (Signed)
Lipid profile a few months ago was excellent in the absence of lipid-lowering therapy; no further attention to this issue appears warranted.

## 2011-05-06 ENCOUNTER — Other Ambulatory Visit: Payer: Self-pay | Admitting: *Deleted

## 2011-05-06 MED ORDER — LISINOPRIL 20 MG PO TABS: 20.0000 mg | ORAL_TABLET | Freq: Every day | ORAL | Status: DC

## 2011-06-05 ENCOUNTER — Other Ambulatory Visit: Payer: Self-pay | Admitting: *Deleted

## 2011-06-05 MED ORDER — METOPROLOL SUCCINATE ER 200 MG PO TB24: ORAL_TABLET | ORAL | Status: DC

## 2011-11-23 ENCOUNTER — Other Ambulatory Visit: Payer: Self-pay

## 2011-11-23 ENCOUNTER — Inpatient Hospital Stay (HOSPITAL_COMMUNITY)
Admission: EM | Admit: 2011-11-23 | Discharge: 2011-11-27 | DRG: 682 | Disposition: A | Discharge status: Home / Self Care | Payer: Medicare Other | Attending: Family Medicine | Admitting: Family Medicine

## 2011-11-23 ENCOUNTER — Emergency Department (HOSPITAL_COMMUNITY): Payer: Medicare Other

## 2011-11-23 ENCOUNTER — Encounter (HOSPITAL_COMMUNITY): Payer: Self-pay | Admitting: *Deleted

## 2011-11-23 DIAGNOSIS — N179 Acute kidney failure, unspecified: Principal | ICD-10-CM | POA: Diagnosis present

## 2011-11-23 DIAGNOSIS — K219 Gastro-esophageal reflux disease without esophagitis: Secondary | ICD-10-CM | POA: Insufficient documentation

## 2011-11-23 DIAGNOSIS — I509 Heart failure, unspecified: Secondary | ICD-10-CM | POA: Diagnosis present

## 2011-11-23 DIAGNOSIS — R6250 Unspecified lack of expected normal physiological development in childhood: Secondary | ICD-10-CM | POA: Diagnosis present

## 2011-11-23 DIAGNOSIS — I4891 Unspecified atrial fibrillation: Secondary | ICD-10-CM | POA: Diagnosis present

## 2011-11-23 DIAGNOSIS — N189 Chronic kidney disease, unspecified: Secondary | ICD-10-CM | POA: Diagnosis present

## 2011-11-23 DIAGNOSIS — I129 Hypertensive chronic kidney disease with stage 1 through stage 4 chronic kidney disease, or unspecified chronic kidney disease: Secondary | ICD-10-CM | POA: Diagnosis present

## 2011-11-23 DIAGNOSIS — F79 Unspecified intellectual disabilities: Secondary | ICD-10-CM | POA: Insufficient documentation

## 2011-11-23 DIAGNOSIS — I5023 Acute on chronic systolic (congestive) heart failure: Secondary | ICD-10-CM | POA: Diagnosis present

## 2011-11-23 DIAGNOSIS — I5033 Acute on chronic diastolic (congestive) heart failure: Secondary | ICD-10-CM

## 2011-11-23 DIAGNOSIS — N289 Disorder of kidney and ureter, unspecified: Secondary | ICD-10-CM

## 2011-11-23 DIAGNOSIS — E875 Hyperkalemia: Secondary | ICD-10-CM

## 2011-11-23 DIAGNOSIS — R079 Chest pain, unspecified: Secondary | ICD-10-CM

## 2011-11-23 DIAGNOSIS — Z7901 Long term (current) use of anticoagulants: Secondary | ICD-10-CM

## 2011-11-23 HISTORY — DX: Essential (primary) hypertension: I10

## 2011-11-23 LAB — DIFFERENTIAL
Basophils Absolute: 0 10*3/uL (ref 0.0–0.1)
Basophils Relative: 0 % (ref 0–1)
Eosinophils Relative: 4 % (ref 0–5)
Monocytes Absolute: 0.4 10*3/uL (ref 0.1–1.0)
Neutro Abs: 5.2 10*3/uL (ref 1.7–7.7)

## 2011-11-23 LAB — COMPREHENSIVE METABOLIC PANEL
AST: 14 U/L (ref 0–37)
Albumin: 4.1 g/dL (ref 3.5–5.2)
Calcium: 9.7 mg/dL (ref 8.4–10.5)
Creatinine, Ser: 2.13 mg/dL — ABNORMAL HIGH (ref 0.50–1.10)

## 2011-11-23 LAB — CBC
Hemoglobin: 11.7 g/dL — ABNORMAL LOW (ref 12.0–15.0)
MCHC: 33.3 g/dL (ref 30.0–36.0)
MCV: 96.7 fL (ref 78.0–100.0)
RBC: 3.63 MIL/uL — ABNORMAL LOW (ref 3.87–5.11)
RDW: 15.7 % — ABNORMAL HIGH (ref 11.5–15.5)
WBC: 6.7 10*3/uL (ref 4.0–10.5)

## 2011-11-23 LAB — TROPONIN I: Troponin I: <0.3 ng/mL (ref <0.30)

## 2011-11-23 LAB — PROTIME-INR: Prothrombin Time: 26.7 seconds — ABNORMAL HIGH (ref 11.6–15.2)

## 2011-11-23 MED ORDER — SODIUM CHLORIDE 0.9 % IV BOLUS (SEPSIS)
500.0000 mL | Freq: Once | INTRAVENOUS | Status: AC
  Administered 2011-11-23: 500 mL via INTRAVENOUS

## 2011-11-23 MED ORDER — METOPROLOL TARTRATE 50 MG PO TABS
150.0000 mg | ORAL_TABLET | Freq: Two times a day (BID) | ORAL | Status: DC
  Administered 2011-11-24 – 2011-11-25 (×4): 150 mg via ORAL
  Filled 2011-11-23 (×2): qty 1
  Filled 2011-11-23 (×3): qty 3

## 2011-11-23 MED ORDER — METOPROLOL TARTRATE 25 MG PO TABS
25.0000 mg | ORAL_TABLET | Freq: Two times a day (BID) | ORAL | Status: DC
  Administered 2011-11-24 – 2011-11-25 (×2): 25 mg via ORAL
  Filled 2011-11-23 (×2): qty 1

## 2011-11-23 MED ORDER — LISINOPRIL 10 MG PO TABS
20.0000 mg | ORAL_TABLET | Freq: Every day | ORAL | Status: DC
  Administered 2011-11-24 – 2011-11-26 (×3): 20 mg via ORAL
  Filled 2011-11-23 (×2): qty 2
  Filled 2011-11-23 (×2): qty 1
  Filled 2011-11-23: qty 2

## 2011-11-23 MED ORDER — FUROSEMIDE 80 MG PO TABS
80.0000 mg | ORAL_TABLET | Freq: Two times a day (BID) | ORAL | Status: DC
  Administered 2011-11-24 – 2011-11-25 (×3): 80 mg via ORAL
  Filled 2011-11-23 (×3): qty 1

## 2011-11-23 MED ORDER — WARFARIN SODIUM 2 MG PO TABS
3.0000 mg | ORAL_TABLET | Freq: Once | ORAL | Status: DC
  Filled 2011-11-23: qty 1

## 2011-11-23 MED ORDER — POLYSACCHARIDE IRON 150 MG PO CAPS
150.0000 mg | ORAL_CAPSULE | Freq: Every day | ORAL | Status: DC
  Administered 2011-11-24 – 2011-11-26 (×3): 150 mg via ORAL
  Filled 2011-11-23 (×6): qty 1

## 2011-11-23 MED ORDER — PANTOPRAZOLE SODIUM 40 MG PO TBEC
40.0000 mg | DELAYED_RELEASE_TABLET | Freq: Every day | ORAL | Status: DC
  Administered 2011-11-23 – 2011-11-26 (×4): 40 mg via ORAL
  Filled 2011-11-23 (×4): qty 1

## 2011-11-23 MED ORDER — SODIUM CHLORIDE 0.9 % IV SOLN
INTRAVENOUS | Status: DC
  Administered 2011-11-23 – 2011-11-25 (×3): via INTRAVENOUS

## 2011-11-23 MED ORDER — ONDANSETRON HCL 4 MG/2ML IJ SOLN
4.0000 mg | Freq: Once | INTRAMUSCULAR | Status: AC
  Administered 2011-11-23: 4 mg via INTRAVENOUS
  Filled 2011-11-23: qty 2

## 2011-11-23 MED ORDER — TRAMADOL HCL 50 MG PO TABS: 50.0000 mg | ORAL_TABLET | Freq: Four times a day (QID) | ORAL | Status: DC | PRN

## 2011-11-23 MED ORDER — SODIUM CHLORIDE 0.9 % IV SOLN
INTRAVENOUS | Status: DC
  Administered 2011-11-24 – 2011-11-26 (×2): via INTRAVENOUS

## 2011-11-23 MED ORDER — ALLOPURINOL 300 MG PO TABS
300.0000 mg | ORAL_TABLET | Freq: Every day | ORAL | Status: DC
  Administered 2011-11-24 – 2011-11-26 (×3): 300 mg via ORAL
  Filled 2011-11-23 (×5): qty 1

## 2011-11-23 NOTE — ED Provider Notes (Signed)
History  This chart was scribed for [employer]. Alvino Chapel, MD by Jenne Campus. This patient was seen in room APA17/APA17 and the patient's care was started at 7:42PM.  CSN: [id number]  Arrival date & time 11/23/11  1859   First MD Initiated Contact with Patient 11/23/11 1931      Chief Complaint  Patient presents with  . Chest Pain    Pt is a Level 5 Caveat- altered mental status The history is provided by a relative. No language interpreter was used.    [name] is a 71 y.o. female with a h/o mental retardation and A. Fib who presents to the Emergency Department complaining of 2 hours of gradual onset chest pain described as a squeezing and tight feeling. Per family, pt was light-headed and nauseated earlier today. They measured her bp at 96/66. Family states that the chest pain with associated vomiting described as dry heaving began one hour after the pt's bp was taken. Family rechecked pt's bp at 128/103 30 minutes PTA. They did not give her any medications PTA. Pt states that she is not currently having chest pain or nausea, but family states that the pt will lie to go home. Per family, pt is not a smoker or alcohol user.    Past Medical History  Diagnosis Date  . Atrial fibrillation     EF of 45%  . Premature ventricular contractions   . Hyperlipidemia     Recent lipid profile is excellent without lipid-lowering therapy  . Degenerative joint disease     status post right total hip arthroplasty  . Mental retardation   . Glaucoma   . GERD (gastroesophageal reflux disease)     -Barrett's esophagus  . Gout   . Adenomatous polyps     -resected via colonoscopy in 2011  . Anemia   . Glaucoma     Past Surgical History  Procedure Date  . Total hip arthroplasty 2006    Right  . Cataract extraction 2006    left eye  . Colonoscopy w/ polypectomy 2011    History reviewed. No pertinent family history.  History  Substance Use Topics  . Smoking status: Never Smoker     . Smokeless tobacco: Never Used  . Alcohol Use: No    OB History    Grav Para Term Preterm Abortions TAB SAB Ect Mult Living                  Review of Systems  Unable to perform ROS: Other  Constitutional: Negative for fever.  HENT: Negative for congestion, sore throat and neck pain.   Respiratory: Negative for cough.   Cardiovascular: Positive for chest pain.  Gastrointestinal: Positive for nausea and vomiting. Negative for diarrhea.  Skin: Negative for rash.    Allergies  Review of patient's allergies indicates no known allergies.  Home Medications   Current Outpatient Rx  Name Route Sig Dispense Refill  . ALLOPURINOL 100 MG PO TABS Oral Take 100 mg by mouth daily.      . DORZOLAMIDE HCL 2 % OP SOLN Both Eyes Place 1 drop into both eyes 2 (two) times daily.      Marland Kitchen ESOMEPRAZOLE MAGNESIUM 40 MG PO CPDR Oral Take 40 mg by mouth daily before breakfast.      . FERROUS GLUCONATE 324 (38 FE) MG PO TABS Oral Take 324 mg by mouth daily with breakfast.      . FUROSEMIDE 20 MG PO TABS Oral Take 40 mg  by mouth 2 (two) times daily.     Marland Kitchen LATANOPROST 0.005 % OP SOLN Both Eyes Place 1 drop into both eyes at bedtime.      Marland Kitchen LISINOPRIL 20 MG PO TABS Oral Take 1 tablet (20 mg total) by mouth daily. 30 tablet 6  . METOPROLOL SUCCINATE ER 200 MG PO TB24  1 tab am 1/2 tab pm 45 tablet 6  . POTASSIUM CHLORIDE CRYS ER 20 MEQ PO TBCR Oral Take 20 mEq by mouth daily.     Marland Kitchen SALINE NASAL SPRAY 0.65 % NA SOLN Nasal Place 1 spray into the nose as needed. For nasal congestion    . TRAMADOL HCL 50 MG PO TABS Oral Take 50 mg by mouth every 4 (four) hours as needed. For pain    . WARFARIN SODIUM 3 MG PO TABS Oral Take 3 mg by mouth as directed. Take one tablet (3mg ) by mouth daily for 2 days, then take Coumadin 4mg  (on tablet), daily for 1 day, etc..(2,1,2,1)    . WARFARIN SODIUM 4 MG PO TABS Oral Take 4 mg by mouth as directed. Take Coumadin 3 mg (1 tablet) for 2 days, then take Coumadin 4 mg (1 tablet)  for 1 day, (2,1,2, 1)      Triage Vitals: BP 124/91  Pulse 129  Temp(Src) 97.5 F (36.4 C) (Oral)  Resp 20  Ht 5\' 8"  (1.727 m)  Wt 211 lb (95.709 kg)  BMI 32.08 kg/m2  SpO2 100%  Physical Exam  Nursing note and vitals reviewed. Constitutional: She is oriented to person, place, and time. She appears well-developed and well-nourished.  HENT:  Head: Normocephalic and atraumatic.  Eyes: Conjunctivae and EOM are normal.  Neck: Normal range of motion. Neck supple.  Cardiovascular: Regular rhythm.        Tachycardic  Pulmonary/Chest: Effort normal and breath sounds normal. No respiratory distress.  Abdominal: Soft. There is no tenderness.  Musculoskeletal: Normal range of motion. She exhibits edema (trace perpherial edema of bilateral legs) and tenderness (mild bilateral flank pain).  Neurological: She is alert and oriented to person, place, and time. No cranial nerve deficit.  Skin: Skin is warm and dry. No rash noted.  Psychiatric: She has a normal mood and affect. Her behavior is normal.    ED Course  Procedures (including critical care time)  DIAGNOSTIC STUDIES: Oxygen Saturation is 100% on room air, normal by my interpretation.    COORDINATION OF CARE: 7:50PM-Discussed treatment plan with family members at pt's bedside and family members agreed. 8:42PM-Discussed lab results with family and family acknowledged results. Discussed admission to the hospital and family agreed.  Labs Reviewed  CBC - Abnormal; Notable for the following:    RBC 3.63 (*)    Hemoglobin 11.7 (*)    HCT 35.1 (*)    RDW 15.7 (*)    All other components within normal limits  DIFFERENTIAL - Abnormal; Notable for the following:    Neutrophils Relative 78 (*)    All other components within normal limits  COMPREHENSIVE METABOLIC PANEL - Abnormal; Notable for the following:    Sodium 134 (*)    Potassium 5.8 (*)    Glucose, Bld 111 (*)    BUN 37 (*)    Creatinine, Ser 2.13 (*)    GFR calc non Af  Amer 23 (*)    GFR calc Af Amer 26 (*)    All other components within normal limits  PROTIME-INR - Abnormal; Notable for the following:  Prothrombin Time 26.7 (*)    INR 2.42 (*)    All other components within normal limits  LIPASE, BLOOD  TROPONIN I  MAGNESIUM  BASIC METABOLIC PANEL  PROTIME-INR  TSH  POCT B-TYPE NATRIURETIC PEPTIDE(BNP)   Dg Chest 2 View  11/23/2011  *RADIOLOGY REPORT*  Clinical Data: Chest pain.  CHEST - 2 VIEW  Comparison: 11/29/2009  Findings: Chronic cardiomegaly with slight pulmonary vascular prominence and slight haziness at both lung bases consistent with mild pulmonary edema.  Huge hiatal hernia, much larger than on the prior study.  IMPRESSION:  1.  Chronic cardiomegaly.  New mild vascular prominence. 2.  Probable mild bilateral pulmonary edema. 3.  Enlarged huge hiatal hernia.  Original Report Authenticated By: Larey Seat, M.D.     1. Renal insufficiency   2. Hyperkalemia   3. Atrial fibrillation   4. CHF (congestive heart failure)     Date: 11/23/2011  Rate: 107  Rhythm: atrial fibrillation  QRS Axis: right  Intervals: normal  ST/T Wave abnormalities: nonspecific ST/T changes  Conduction Disutrbances:afib  Narrative Interpretation:   Old EKG Reviewed: none available     MDM  Cough and SOB. Worsening renal insufficiency and mild hyperkalemia. Admitted    I personally performed the services described in this documentation, which was scribed in my presence. The recorded information has been reviewed and considered.       Jasper Riling. Alvino Chapel, MD 11/23/11 [phone]

## 2011-11-23 NOTE — H&P (Signed)
[name], [name] NO.:  [id number]  MEDICAL RECORD NO.:  [id number]  LOCATION:  [id number]                         FACILITY:  [facility]  PHYSICIAN:  Unk Lightning, MDDATE OF BIRTH:  12-04-1940  DATE OF ADMISSION:  11/23/2011 DATE OF DISCHARGE:  LH                             HISTORY & PHYSICAL   The patient is a 71 year old white female, patient of Dr. Everette Rank who has mental cognitive dysfunction since childhood.  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  She has chronic atrial fibrillation, anticoagulated on Coumadin, and the family relates that because the patient is a poor historian that she had an episode of nausea and retching tonight, and they took a blood pressure, it was 90 systolic, and they took a blood pressure 10 minutes later, it was 123456 systolic, and then the patient stated she had some crumpling chest discomfort and they brought her to the ER by family members for EMT. Cardiac enzymes were negative.  She was found to have borderline minimal CHF per chest x-ray, and was hemodynamically stable.  The problem is she has had worsening renal impairment with a creatinine of 2.13, previous one being 1.4, potassium of 5.8.  She is admitted to give aggressive IV fluid repletion.  Monitor serum potassium.  There are no EKG changes consistent with hyperkalemia, and monitor renal function as well as CHF.  PAST MEDICAL HISTORY:  Significant for gout, congestive heart failure, hypertension, hyperlipidemia, GERD, iron deficiency, chronic atrial fibrillation, and anticoagulation.  PAST SURGICAL HISTORY:  Remarkable for right and left hip prosthesis and appendectomy.  She has no known allergies.  CURRENT MEDICINES: 1. Allopurinol 100 mg p.o. daily. 2. Lasix 40 mg 2 tablets p.o. b.i.d. 3. Lisinopril 20 mg p.o. daily. 4. Metoprolol 150 mg p.o. b.i.d. 5. Protonix 40 mg p.o. daily. 6. Niferex 150 mg p.o. daily.  She does  not smoke, never smoked.  She is disabled mentally, and lives at home all of her life.  PHYSICAL EXAMINATION:  VITAL SIGNS:  Blood pressure is 124/91, temperature 97.5, respiratory rate is 20, and O2 sat is 100% on room air. EYES:  PERRLA.  Extraocular movements intact.  Sclerae clear. Conjunctivae pink. NECK:  No JVD.  No carotid bruits.  No thyromegaly.  No thyroid bruits. LUNGS:  Diminished breath sounds at the bases.  No rales, wheeze, or rhonchi appreciable. HEART:  Irregularly irregular.  No S3 gallop auscultated.  No heaves, thrills or rubs. ABDOMEN:  Soft, nontender.  Bowel sounds normoactive.  No guarding, rebound, or hepatosplenomegaly. EXTREMITIES:  Trace to 1+ pedal edema bilaterally.  Peripheral pulses 1+ and intact bilaterally.  Cranial nerves II through XII grossly intact. The patient moves all 4 extremities.  Plantars are downgoing.  IMPRESSION: 1. Chronic atrial fibrillation. 2. Anticoagulation with INR 2.42. 3. Worsening renal failure.  Creatinine 2.13, previous 1.4. 4. Hyperkalemia, potassium 5.8 without EKG changes. 5. Hypertension. 6. Iron deficiency history.  The plan right now is to continue all current medicines, give 0.9 normal saline 100 mL an hour.  Monitor BMET daily x3, monitor serum potassium. We will not give potassium depleting agents at present, and continue Coumadin  as per pharmacy consult.     Unk Lightning, MD     RMD/MEDQ  D:  11/23/2011  T:  11/23/2011  Job:  [id number]

## 2011-11-23 NOTE — ED Notes (Signed)
Onset 5 pm.

## 2011-11-23 NOTE — Progress Notes (Signed)
ANTICOAGULATION CONSULT NOTE - Initial Consult  Pharmacy Consult for Coumadin Indication: atrial fibrillation  No Known Allergies  Patient Measurements: Height: 5\' 8"  (172.7 cm) Weight: 211 lb (95.709 kg) IBW/kg (Calculated) : 63.9    Vital Signs: Temp: 97.5 F (36.4 C) (01/21 1902) Temp src: Oral (01/21 1902) BP: 124/91 mmHg (01/21 1902) Pulse Rate: 129  (01/21 1902)  Labs:  [name] 11/23/11 1954  HGB 11.7*  HCT 35.1*  PLT 228  APTT --  LABPROT 26.7*  INR 2.42*  HEPARINUNFRC --  CREATININE 2.13*  CKTOTAL --  CKMB --  TROPONINI <0.30   Estimated Creatinine Clearance: 30.1 ml/min (by C-G formula based on Cr of 2.13).  Medical History: Past Medical History  Diagnosis Date  . Atrial fibrillation     EF of 45%  . Premature ventricular contractions   . Hyperlipidemia     Recent lipid profile is excellent without lipid-lowering therapy  . Degenerative joint disease     status post right total hip arthroplasty  . Mental retardation   . Glaucoma   . GERD (gastroesophageal reflux disease)     -Barrett's esophagus  . Gout   . Adenomatous polyps     -resected via colonoscopy in 2011  . Anemia   . Glaucoma     Medications:  Scheduled:    . allopurinol  300 mg Oral Daily  . furosemide  80 mg Oral BID  . lisinopril  20 mg Oral Daily  . metoprolol tartrate  150 mg Oral BID  . metoprolol tartrate  25 mg Oral BID  . ondansetron  4 mg Intravenous Once  . pantoprazole  40 mg Oral Q1200  . polysaccharide iron  150 mg Oral Daily  . sodium chloride  500 mL Intravenous Once  . warfarin  3 mg Oral Once    Assessment: Ok for protocol INR 2.42 tonight  Goal of Therapy:  INR 2-3   Plan Coumadin 3 mg x1 dose tonight INR daily Monitor CBC   [name], [name] 11/23/2011,9:34 PM

## 2011-11-23 NOTE — H&P (Signed)
[id number] 

## 2011-11-24 ENCOUNTER — Encounter (HOSPITAL_COMMUNITY): Payer: Self-pay | Admitting: *Deleted

## 2011-11-24 LAB — BASIC METABOLIC PANEL
GFR calc Af Amer: 30 mL/min — ABNORMAL LOW (ref >90)
GFR calc non Af Amer: 26 mL/min — ABNORMAL LOW (ref >90)
Potassium: 4.7 mEq/L (ref 3.5–5.1)
Sodium: 137 mEq/L (ref 135–145)

## 2011-11-24 LAB — PROTIME-INR
INR: 2.78 — ABNORMAL HIGH (ref 0.00–1.49)
Prothrombin Time: 29.8 seconds — ABNORMAL HIGH (ref 11.6–15.2)

## 2011-11-24 MED ORDER — WARFARIN SODIUM 1 MG PO TABS
3.0000 mg | ORAL_TABLET | Freq: Once | ORAL | Status: AC
  Administered 2011-11-24: 3 mg via ORAL
  Filled 2011-11-24: qty 2

## 2011-11-24 NOTE — Progress Notes (Signed)
Pt chart reviewed. Family member present. Regular diet with 0% po noted however, pt has eaten take out bacon and eggs for breakfast and  [employer] Chicken tenders and french fries with can cola for lunch. Family reports that she is a very picky eater but appetite and po intake stable. Her wt 218.11# has significantly increased 7# overnight. Wt  03/24/11 was 208# per Cardiology note. No nutrition intervention rec at this time.    Dietitian [phone]

## 2011-11-24 NOTE — Progress Notes (Signed)
CARE MANAGEMENT NOTE 11/24/2011  Patient:  [name], [name]   Account Number:  [id number]  Date Initiated:  11/24/2011  Documentation initiated by:  Claretha Cooper  Subjective/Objective Assessment:   Pt admitted with chest pain. PTA pt lived at home with family. Davonna Belling provides 56 hrs a month assistance.     Action/Plan:   Pt plans to DC home with no additional HH needs.   Anticipated DC Date:  11/27/2011   Anticipated DC Plan:  Opp  CM consult      Choice offered to / List presented to:             Status of service:  In process, will continue to follow Medicare Important Message given?   (If response is "NO", the following Medicare IM given date fields will be blank) Date Medicare IM given:   Date Additional Medicare IM given:    Discharge Disposition:    Per UR Regulation:    Comments:  11/24/11 [place]

## 2011-11-24 NOTE — Progress Notes (Signed)
[name], [name]                 ACCOUNT NO.:  [id number]  MEDICAL RECORD NO.:  [id number]  LOCATION:                                 FACILITY:  PHYSICIAN:  Ozzie Knobel G. Everette Rank, MD   DATE OF BIRTH:  1941/07/08  DATE OF PROCEDURE:  11/24/2011 DATE OF DISCHARGE:                                PROGRESS NOTE   SUBJECTIVE:  This patient was admitted to the hospital following episode of nausea and retching and lower blood pressure in the 90 range.  She has known chronic atrial fibrillation, is on anticoagulation.  She was thought to have worsening renal failure.  Creatinine on admission was 2.13, however, with hydration it is now 1.91.  She did have hyperkalemia with potassium 5.8 without EKG change.  A repeat potassium was 4.7 and the patient was started on Lasix.  OBJECTIVE:  VITAL SIGNS:  Blood pressure 124/86, respirations 18, pulse 78, temperature 97.4. HEART:  Irregular rhythm. LUNGS:  Diminished breath sounds. ABDOMEN:  No palpable organs or masses. EXTREMITIES:  Trace of edema in extremities.  ASSESSMENT:  The patient does have chronic atrial fibrillation and is on anticoagulation and has a history of hypertension, iron deficiency anemia, elevated creatinine.  Chest x-ray showed mild bilateral pulmonary edema.  PLAN:  To continue monitor the patient.  We will continue Lasix.  We will obtain Cardiology consult.     Quatavious Rossa G. Everette Rank, MD     AGM/MEDQ  D:  11/24/2011  T:  11/24/2011  Job:  [id number]

## 2011-11-24 NOTE — Progress Notes (Addendum)
ANTICOAGULATION CONSULT NOTE -  Pharmacy Consult for Coumadin Indication: atrial fibrillation  No Known Allergies  Patient Measurements: Height: 5\' 8"  (172.7 cm) Weight: 218 lb 11.1 oz (99.2 kg) IBW/kg (Calculated) : 63.9    Vital Signs: Temp: 97.4 F (36.3 C) (01/22 0503) Temp src: Oral (01/22 0503) BP: 124/86 mmHg (01/22 0503) Pulse Rate: 78  (01/22 0503)  Labs:  Basename 11/24/11 0517 11/23/11 1954  HGB -- 11.7*  HCT -- 35.1*  PLT -- 228  APTT -- --  LABPROT 29.8* 26.7*  INR 2.78* 2.42*  HEPARINUNFRC -- --  CREATININE 1.91* 2.13*  CKTOTAL -- --  CKMB -- --  TROPONINI -- <0.30   Estimated Creatinine Clearance: 34.2 ml/min (by C-G formula based on Cr of 1.91).  Medical History: Past Medical History  Diagnosis Date  . Atrial fibrillation     EF of 45%  . Premature ventricular contractions   . Hyperlipidemia     Recent lipid profile is excellent without lipid-lowering therapy  . Degenerative joint disease     status post right total hip arthroplasty  . Mental retardation   . Glaucoma   . GERD (gastroesophageal reflux disease)     -Barrett's esophagus  . Gout   . Adenomatous polyps     -resected via colonoscopy in 2011  . Anemia   . Glaucoma   . Hypertension     Medications:  Scheduled:     . allopurinol  300 mg Oral Daily  . furosemide  80 mg Oral BID  . lisinopril  20 mg Oral Daily  . metoprolol tartrate  150 mg Oral BID  . metoprolol tartrate  25 mg Oral BID  . ondansetron  4 mg Intravenous Once  . pantoprazole  40 mg Oral Q1200  . polysaccharide iron  150 mg Oral Daily  . sodium chloride  500 mL Intravenous Once  . warfarin  3 mg Oral Once    Assessment:  Therapeutic INR. AFib- continuation of home therapy. Home dose is a three day cycle of 3mg , 3mg , 4 mg.   Goal of Therapy:   INR 2-3   Plan Coumadin 3 mg x1 dose tonight INR daily Monitor CBC   Jetta Lout J 11/24/2011,8:22 AM

## 2011-11-25 DIAGNOSIS — I319 Disease of pericardium, unspecified: Secondary | ICD-10-CM

## 2011-11-25 DIAGNOSIS — I5033 Acute on chronic diastolic (congestive) heart failure: Secondary | ICD-10-CM

## 2011-11-25 DIAGNOSIS — I5023 Acute on chronic systolic (congestive) heart failure: Secondary | ICD-10-CM

## 2011-11-25 DIAGNOSIS — N289 Disorder of kidney and ureter, unspecified: Secondary | ICD-10-CM

## 2011-11-25 DIAGNOSIS — R079 Chest pain, unspecified: Secondary | ICD-10-CM

## 2011-11-25 DIAGNOSIS — I4891 Unspecified atrial fibrillation: Secondary | ICD-10-CM

## 2011-11-25 LAB — BASIC METABOLIC PANEL
BUN: 30 mg/dL — ABNORMAL HIGH (ref 6–23)
GFR calc non Af Amer: 23 mL/min — ABNORMAL LOW (ref >90)
Glucose, Bld: 91 mg/dL (ref 70–99)
Potassium: 4.3 mEq/L (ref 3.5–5.1)

## 2011-11-25 MED ORDER — FUROSEMIDE 10 MG/ML IJ SOLN
40.0000 mg | Freq: Two times a day (BID) | INTRAMUSCULAR | Status: DC
  Administered 2011-11-25 – 2011-11-26 (×2): 40 mg via INTRAVENOUS
  Filled 2011-11-25 (×2): qty 4

## 2011-11-25 MED ORDER — WARFARIN SODIUM 2 MG PO TABS
4.0000 mg | ORAL_TABLET | Freq: Once | ORAL | Status: AC
  Administered 2011-11-25: 4 mg via ORAL
  Filled 2011-11-25: qty 2

## 2011-11-25 MED ORDER — METOPROLOL SUCCINATE ER 50 MG PO TB24
200.0000 mg | ORAL_TABLET | ORAL | Status: DC
  Filled 2011-11-25: qty 4

## 2011-11-25 MED ORDER — METOPROLOL SUCCINATE ER 50 MG PO TB24
100.0000 mg | ORAL_TABLET | Freq: Every evening | ORAL | Status: DC
  Administered 2011-11-25 – 2011-11-26 (×2): 100 mg via ORAL
  Filled 2011-11-25 (×2): qty 2

## 2011-11-25 NOTE — Progress Notes (Signed)
*  PRELIMINARY RESULTS* Echocardiogram 2D Echocardiogram has been performed.  Tera Partridge 11/25/2011, 3:16 PM

## 2011-11-25 NOTE — Progress Notes (Signed)
ANTICOAGULATION CONSULT NOTE -  Pharmacy Consult for Coumadin Indication: atrial fibrillation  No Known Allergies  Patient Measurements: Height: 5\' 8"  (172.7 cm) Weight: 218 lb 11.1 oz (99.2 kg) IBW/kg (Calculated) : 63.9   Vital Signs: Temp: 97 F (36.1 C) (01/23 0500) Temp src: Oral (01/23 0500) BP: 92/55 mmHg (01/23 0500) Pulse Rate: 80  (01/23 0500)  Labs:  Basename 11/25/11 0444 11/24/11 0517 11/23/11 1954  HGB -- -- 11.7*  HCT -- -- 35.1*  PLT -- -- 228  APTT -- -- --  LABPROT 25.7* 29.8* 26.7*  INR 2.30* 2.78* 2.42*  HEPARINUNFRC -- -- --  CREATININE 2.06* 1.91* 2.13*  CKTOTAL -- -- --  CKMB -- -- --  TROPONINI -- -- <0.30   Estimated Creatinine Clearance: 31.7 ml/min (by C-G formula based on Cr of 2.06).  Medical History: Past Medical History  Diagnosis Date  . Atrial fibrillation     EF of 45%  . Premature ventricular contractions   . Hyperlipidemia     Recent lipid profile is excellent without lipid-lowering therapy  . Degenerative joint disease     status post right total hip arthroplasty  . Mental retardation   . Glaucoma   . GERD (gastroesophageal reflux disease)     -Barrett's esophagus  . Gout   . Adenomatous polyps     -resected via colonoscopy in 2011  . Anemia   . Glaucoma   . Hypertension    Medications:  Scheduled:     . allopurinol  300 mg Oral Daily  . furosemide  80 mg Oral BID  . lisinopril  20 mg Oral Daily  . metoprolol tartrate  150 mg Oral BID  . metoprolol tartrate  25 mg Oral BID  . pantoprazole  40 mg Oral Q1200  . polysaccharide iron  150 mg Oral Daily  . warfarin  3 mg Oral ONCE-1800  . warfarin  4 mg Oral ONCE-1800  . DISCONTD: warfarin  3 mg Oral Once   Assessment:  Therapeutic INR. AFib- continuation of home therapy. Home dose is a three day cycle of 3mg , 3mg , 4 mg.   Goal of Therapy:   INR 2-3   Plan Coumadin 4mg  today INR daily Monitor CBC   Hart Robinsons A 11/25/2011,8:06 AM

## 2011-11-25 NOTE — Consult Note (Signed)
CARDIOLOGY CONSULT NOTE  Patient ID: [name] MRN: [id number] DOB/AGE: August 19, 1941 71 y.o.  Admit date: 11/23/2011 Referring Physician: Everette Rank Primary PhysicianMCINNIS,ANGUS Darnell Level, MD, MD Primary Cardiologist: Lattie Haw Reason for Consultation: CP, Pulmonary Edema Active Problems:  Atrial fibrillation  Systolic dysfunction  MENTAL RETARDATION  GERD (gastroesophageal reflux disease)  HPI: [name] is a 71 year old patient of Dr. Lattie Haw. We are following for ongoing assessment treatment of atrial fibrillation. She has a history of systolic dysfunction with most recent echocardiogram in 2010 revealing an EF of 45%. She is followed by Dr. Emilee Hero in the Coumadin clinic for his office for INR checks. She was last seen in the office by Dr. Lattie Haw in May 2012 and was doing well.Wt at that time was 208 lbs.  There was a discussion with the family at that time about changing the Coumadin to dabiigatran. However, family wished to continue Coumadin. She has a history of mental retardation, GERD, CHF, hypertension, hyperlipidemia, iron deficient anemia and polio as a child.      She is new to the emergency room after patient complaints of severe chest discomfort, with nausea and vomiting. Initially, per her family. Her blood pressure was hypotensive with systolic blood pressure in the 90s. On arrival patient's blood pressure was 11/25/1989 with heart rate 129 beats per minute. She was found to be hyperkalemic with a potassium of 5.8, creatinine of 2.13. She was mildly anemic with hemoglobin 11.7 with hematocrit of 35.1. It was noted that her INR was elevated to 2.42. Review chest x-ray revealed chronic cardiomegaly with mild vascular prominence. Probable mild bilateral pulmonary edema with a huge hiatal hernia.. It was noted that her proBNP was elevated at 2714 with a negative cardiac enzyme initially. She has subsequently been placed on by mouth Lasix 80 mg twice a day (home dose 40 mg BID), along with  continuation of metoprolol, Coumadin, and lisinopril. According to weights recorded. She has gained 7 pounds since admission, along with positive intake and output.   Of note, she was seen in Dr. Emilee Hero. His office the first week of January for routine checkup. The patient apparently had become very short of breath walking from her home to family members car having to stop couple times in order to catch her breath. She did have some lower extremity edema at that time. This was reported to Dr. Emilee Hero. No changes in her medication or additional testing was completed at that time, with the exception of adding an additional dose of Lasix with increased edema.. She apparently had resolution of this on its own. She currently denies any shortness of breath, dizziness, or lower extremity edema. Review of systems complete and found to be negative unless listed above   Past Medical History  Diagnosis Date  . Atrial fibrillation     EF of 45%  . Premature ventricular contractions   . Hyperlipidemia     Recent lipid profile is excellent without lipid-lowering therapy  . Degenerative joint disease     status post right total hip arthroplasty  . Mental retardation   . Glaucoma   . GERD (gastroesophageal reflux disease)     -Barrett's esophagus  . Gout   . Adenomatous polyps     -resected via colonoscopy in 2011  . Anemia   . Glaucoma   . Hypertension     History reviewed. No pertinent family history.  History   Social History  . Marital Status: Single    Spouse Name: N/A    Number of  Children: N/A  . Years of Education: N/A   Occupational History  . disabled    Social History Main Topics  . Smoking status: Never Smoker   . Smokeless tobacco: Never Used  . Alcohol Use: No  . Drug Use: No  . Sexually Active: Not on file   Other Topics Concern  . Not on file   Social History Narrative  . No narrative on file    Past Surgical History  Procedure Date  . Total hip arthroplasty 2006     Right  . Cataract extraction 2006    left eye  . Colonoscopy w/ polypectomy 2011     Prescriptions prior to admission  Medication Sig Dispense Refill  . allopurinol (ZYLOPRIM) 100 MG tablet Take 100 mg by mouth daily.        . dorzolamide (TRUSOPT) 2 % ophthalmic solution Place 1 drop into both eyes 2 (two) times daily.        Marland Kitchen esomeprazole (NEXIUM) 40 MG capsule Take 40 mg by mouth daily before breakfast.        . ferrous gluconate (FERGON) 324 MG tablet Take 324 mg by mouth daily with breakfast.        . furosemide (LASIX) 20 MG tablet Take 40 mg by mouth 2 (two) times daily.       Marland Kitchen latanoprost (XALATAN) 0.005 % ophthalmic solution Place 1 drop into both eyes at bedtime.        Marland Kitchen lisinopril (PRINIVIL,ZESTRIL) 20 MG tablet Take 1 tablet (20 mg total) by mouth daily.  30 tablet  6  . metoprolol (TOPROL-XL) 200 MG 24 hr tablet 1 tab am 1/2 tab pm  45 tablet  6  . potassium chloride SA (K-DUR,KLOR-CON) 20 MEQ tablet Take 20 mEq by mouth daily.       . sodium chloride (OCEAN) 0.65 % nasal spray Place 1 spray into the nose as needed. For nasal congestion      . traMADol (ULTRAM) 50 MG tablet Take 50 mg by mouth every 4 (four) hours as needed. For pain      . warfarin (COUMADIN) 3 MG tablet Take 3 mg by mouth as directed. Take one tablet (3mg ) by mouth daily for 2 days, then take Coumadin 4mg  (on tablet), daily for 1 day, etc..(2,1,2,1)      . warfarin (COUMADIN) 4 MG tablet Take 4 mg by mouth as directed. Take Coumadin 3 mg (1 tablet) for 2 days, then take Coumadin 4 mg (1 tablet) for 1 day, (2,1,2, 1)        Physical Exam: Blood pressure 92/55, pulse 80, temperature 97 F (36.1 C), temperature source Oral, resp. rate 18, height 5\' 8"  (1.727 m), weight 218 lb 11.1 oz (99.2 kg), SpO2 95.00%.  General: Well developed, well nourished, in no acute distress Head: Eyes PERRLA, No xanthomas.   Normal cephalic and atramatic  Lungs: Diminished breath sounds at the bases. No wheezes or rhonchi.  No cough. Heart: HRIR S1 S2, 1/6 systolic murmur.  Pulses are 2+ & equal.            No carotid bruit. No JVD.  No abdominal bruits. No femoral bruits. Abdomen: Bowel sounds are positive, abdomen soft and non-tender without masses or                  Hernia's noted. Msk:  Back normal, normal gait. Normal strength and tone for age. Extremities: No clubbing, cyanosis or edema.  DP +1 Neuro: Alert and  oriented X 3. Psych:  Good affect, responds appropriately   Labs:   Lab Results  Component Value Date   WBC 6.7 11/23/2011   HGB 11.7* 11/23/2011   HCT 35.1* 11/23/2011   MCV 96.7 11/23/2011   PLT 228 11/23/2011     Lab 11/25/11 0444 11/23/11 1954  NA 140 --  K 4.3 --  CL 103 --  CO2 29 --  BUN 30* --  CREATININE 2.06* --  CALCIUM 9.2 --  PROT -- 7.8  BILITOT -- 0.3  ALKPHOS -- 101  ALT -- 13  AST -- 14  GLUCOSE 91 --   Lab Results  Component Value Date   CKTOTAL 32 06/21/2009   TROPONINI <0.30 11/23/2011    Lab Results  Component Value Date   CHOL 132 07/27/2009   Lab Results  Component Value Date   HDL 43 07/27/2009   Lab Results  Component Value Date   LDLCALC 75 07/27/2009   Lab Results  Component Value Date   TRIG 69 07/27/2009   No results found for this basename: CHOLHDL    ECHO: 10/2009 1. Left ventricle: global hypokinesis EF diff to judge due to irregular rhythm but septum and anterolateral walls seem more hypokinetic The cavity size was mildly dilated. Wall thickness was increased in a pattern of severe LVH. Systolic function was mildly reduced. The estimated ejection fraction was in the range of 45% to 50%. 2. Left atrium: The atrium was mildly dilated. 3. Right ventricle: The cavity size was mildly dilated. 4. Right atrium: The atrium was mildly dilated. 5. Atrial septum: No defect or patent foramen ovale was identified. 6. Pericardium, extracardiac: A trivial pericardial effusion was identified posterior to the heart.     Radiology: Dg Chest 2  View  11/23/2011  *RADIOLOGY REPORT*  Clinical Data: Chest pain.  CHEST - 2 VIEW  Comparison: 11/29/2009  IMPRESSION:  1.  Chronic cardiomegaly.  New mild vascular prominence. 2.  Probable mild bilateral pulmonary edema. 3.  Enlarged huge hiatal hernia.  Original Report Authenticated By: Larey Seat, M.D.   EKG: Atrial fibrillation with inferior T-wave abnormalities, unchanged since EKG completed in January of 2011.  ASSESSMENT AND PLAN:   1. Pulmonary edema: She does have diminished breath sounds bibasilar but clear. No wheezes or rhonchi. I am not seeing evidence of fluid overload in the lower extremities. Initial BNP was elevated at 2714 with increased dose of Lasix by mouth from 40 mg twice a day to 80 mg twice a day on admission. Unfortunately, she has gained weight and continues to eat salty food to her family brings in from home, to include sausage biscuits and salted cheese snacks. Elevated creatinine may have been related to low output state with decreased LV systolic function. We will repeat echo for reevaluation of this. In the, interim, we will change Lasix to IV 40 mg twice a day, nutrition consult will be requested for low salt diet. I have discussed this with her family to stop bringing an outside food, that is heavy with salt. Her family states that they prepare her medications daily and also assist with grocery shopping.  2.Atrial Fibrillation: Heart rate initially was uncontrolled on admission. She was given an extra dose of by mouth metoprolol in the ER and has been resumed on her home medication dosing . 150 mg twice a day. She is also on ACE inhibitor and was found to be hyperkalemic on admission. However, this has resolved. Would not take her off  ACE inhibitor at this time. Coumadin management is being completed by pharmacy, and will need to be continued as outpatient. She denies any bleeding, melena, or hemoptysis.  3. Anemia: Review of hemoglobin and hematocrit for the last  year reveals stable. H&H. Lowest I saw was 1 year ago with a hemoglobin of 7.8. Currently hemoglobin is 11.7, with hematocrit of 35.1. Continue iron replacement.  4. Hiatal hernia: Chest x-ray revealed enlarged huge hiatal hernia. This may be the etiology. of her chest discomfort, which she described as something "balled up in the middle" of her chest. This may need to be addressed as an outpatient with surgical intervention. Should this become necessary. PPI is now on board per PCP.   Signed: Phill Myron. Purcell Nails NP [facility] 11/25/2011, 9:27 AM Co-Sign MD   Attending note:  Patient seen and examined. Reviewed database as recorded by [name]. [name] is a 71 year old woman with history of mental retardation, chronic atrial fibrillation, mild left ventricular dysfunction based on previous echocardiogram, hyperlipidemia, hypertension. She is admitted to the hospital with recent complaints including increased lower extremity edema, dyspnea on exertion, and recent episode of chest pain. No progressive palpitations. Did have some nausea and emesis with her chest pain. She has otherwise been stable in terms of heart rate response in atrial fibrillation by telemetry, although initially heart rate was rapid, and her systolic blood pressure was in the 90s. She does have evidence of renal insufficiency, cardiac enzymes have been normal, although BNP 2714. She was admitted for further evaluation including increase in diuretic regimen.  On examination she is comfortable, afebrile, most recent blood pressure 92/55, heart rate in the 80s in atrial fibrillation by telemetry. No obvious elevated JVP, lungs with diminished breath sounds particularly at the bases, cardiac with irregularly irregular rhythm and indistinct PMI, trace ankle edema.  Additional laboratory data shows potassium 4.3, BUN 30, creatinine 2.0, hemoglobin 11.7, platelets 228, INR 2.3. ECG reviewed showing atrial fibrillation with  diffuse nonspecific ST-T changes and rightward axis.  Etiology of chest pain not clear. She was noted to have a very large hiatal hernia by chest imaging, possibly the culprit with known reflux. Cardiac enzymes argue against an acute coronary syndrome, and otherwise atrial fibrillation has been relatively stable. Agree with diuresis at this time, and followup echocardiogram to reassess LV function. Otherwise continue beta blocker for heart rate control. Of note, she has been placed on short acting formulation and should be on Toprol-XL. We will follow with you.  Satira Sark, M.D., F.A.C.C.

## 2011-11-25 NOTE — Progress Notes (Signed)
CARE MANAGEMENT NOTE 11/25/2011  Patient:  [name], [name]   Account Number:  [id number]  Date Initiated:  11/24/2011  Documentation initiated by:  Claretha Cooper  Subjective/Objective Assessment:   Pt admitted with chest pain. PTA pt lived at home with family. [employer] Belling provides 56 hrs a month assistance.     Action/Plan:   Pt plans to DC home with no additional HH needs.   Anticipated DC Date:  11/27/2011   Anticipated DC Plan:  [place]  CM consult      Choice offered to / List presented to:             Status of service:  In process, will continue to follow Medicare Important Message given?   (If response is "NO", the following Medicare IM given date fields will be blank) Date Medicare IM given:   Date Additional Medicare IM given:    Discharge Disposition:    Per UR Regulation:    Comments:  11/25/11 [place] tomorrow. Family states they used [facility] in the past but do not anticipate HH needs. At family's request, CM called [facility] Belling to notify them of her admission and pending DC.  11/24/11 [place] BSN CM

## 2011-11-25 NOTE — Progress Notes (Signed)
NAMEJOVINA, MOMPREMIER                 ACCOUNT NO.:  [id number]  MEDICAL RECORD NO.:  [id number]  LOCATION:  [id number]                          FACILITY:  [facility]  PHYSICIAN:  Joclynn Lumb G. Everette Rank, MD   DATE OF BIRTH:  03/06/1941  DATE OF PROCEDURE: DATE OF DISCHARGE:                                PROGRESS NOTE   This patient is feeling some better.  She has known atrial fibrillation, is on anticoagulation.  She is thought to have worsening renal failure. She does have a creatinine of 2.06 with BUN of 30.  Also has elevated BNP 2714 with INR 2.30 and negative fluid balance of 638.  OBJECTIVE:  VITAL SIGNS:  Blood pressure 92/55, respirations 18, pulse 80, temp 97. HEART:  Irregular rhythm. LUNGS:  Diminished breath sounds. ABDOMEN:  No palpable organs or masses. EXTREMITIES:  Mild edema.  ASSESSMENT:  The patient does have chronic atrial fibrillation, is on anticoagulation for this.  Does have a history of hypertension, iron deficiency anemia, elevated creatinine.  Chest x-ray showed mild bilateral pulmonary edema.  Does have elevated BNP at 2714.  Plan to continue to diurese.  We will repeat labs, obtain cardiology consult.     Kasey Ewings G. Everette Rank, MD     AGM/MEDQ  D:  11/25/2011  T:  11/25/2011  Job:  [id number]

## 2011-11-26 ENCOUNTER — Inpatient Hospital Stay (HOSPITAL_COMMUNITY): Payer: Medicare Other

## 2011-11-26 LAB — PROTIME-INR: INR: 2.2 — ABNORMAL HIGH (ref 0.00–1.49)

## 2011-11-26 LAB — BASIC METABOLIC PANEL
CO2: 30 mEq/L (ref 19–32)
Glucose, Bld: 113 mg/dL — ABNORMAL HIGH (ref 70–99)
Potassium: 3.6 mEq/L (ref 3.5–5.1)
Sodium: 139 mEq/L (ref 135–145)

## 2011-11-26 LAB — CBC
Platelets: 202 10*3/uL (ref 150–400)
RBC: 3.31 MIL/uL — ABNORMAL LOW (ref 3.87–5.11)
WBC: 5.2 10*3/uL (ref 4.0–10.5)

## 2011-11-26 LAB — SURGICAL PCR SCREEN
MRSA, PCR: NEGATIVE
Staphylococcus aureus: NEGATIVE

## 2011-11-26 LAB — IRON AND TIBC
Iron: 52 ug/dL (ref 42–135)
Saturation Ratios: 18 % — ABNORMAL LOW (ref 20–55)
TIBC: 293 ug/dL (ref 250–470)
UIBC: 241 ug/dL (ref 125–400)

## 2011-11-26 MED ORDER — FUROSEMIDE 40 MG PO TABS
40.0000 mg | ORAL_TABLET | Freq: Every day | ORAL | Status: DC
  Administered 2011-11-26: 40 mg via ORAL
  Filled 2011-11-26: qty 1

## 2011-11-26 MED ORDER — WARFARIN SODIUM 2 MG PO TABS
4.0000 mg | ORAL_TABLET | Freq: Once | ORAL | Status: AC
  Administered 2011-11-26: 4 mg via ORAL
  Filled 2011-11-26: qty 2

## 2011-11-26 NOTE — Progress Notes (Addendum)
SUBJECTIVE:Wants to go home. Breathing better.  She denies chest discomfort  Active Problems:  Atrial fibrillation  Systolic dysfunction  MENTAL RETARDATION  GERD (gastroesophageal reflux disease)  Acute on chronic systolic heart failure  Chest pain Acute on chronic renal dysfunction   LABS: Basic Metabolic Panel:  Basename 11/26/11 0533 11/25/11 0444 11/23/11 2125  NA 139 140 --  K 3.6 4.3 --  CL 101 103 --  CO2 30 29 --  GLUCOSE 113* 91 --  BUN 25* 30* --  CREATININE 1.83* 2.06* --  CALCIUM 9.4 9.2 --  MG -- -- 2.3  PHOS -- -- --   Liver Function Tests:  [facility] 11/23/11 1954  AST 14  ALT 13  ALKPHOS 101  BILITOT 0.3  PROT 7.8  ALBUMIN 4.1    Basename 11/23/11 1954  LIPASE 56  AMYLASE --   CBC:  Basename 11/23/11 1954  WBC 6.7  NEUTROABS 5.2  HGB 11.7*  HCT 35.1*  MCV 96.7  PLT 228   Cardiac Enzymes:  Basename 11/23/11 1954  CKTOTAL --  CKMB --  CKMBINDEX --  TROPONINI <0.30   Thyroid Function Tests:  Basename 11/25/11 1010  TSH 3.852  T4TOTAL --  T3FREE --  THYROIDAB --   Echo: 11/25/2011 Left ventricle: Mild LVH with EF in the range of 45% to 50%. Diffuse hypokinesis.  Moderate left atrial enlargement RADIOLOGY:Chest X-Ray Findings: Chronic cardiomegaly with slight pulmonary vascular prominence and slight haziness at both lung bases consistent with mild pulmonary edema. Huge hiatal hernia, much larger than on the prior study.  IMPRESSION: 1. Chronic cardiomegaly. New mild vascular prominence. 2. Probable mild bilateral pulmonary edema. 3. Enlarged huge hiatal hernia.  PHYSICAL EXAM BP 95/61  Pulse 60  Temp(Src) 98.1 F (36.7 C) (Oral)  Resp 20  Ht 5\' 8"  (1.727 m)  Wt 211 lb (95.709 kg)  BMI 32.08 kg/m2  SpO2 100% General: Well developed, well nourished, in no acute distress Head: Eyes PERRLA, No xanthomas.   Normal cephalic and atramatic  Lungs: Clear bilaterally to auscultation and percussion; decreased breath sounds  at the bases; poor inspiratory effort Heart: HRRR distant S1 & S2, No MRG .  Pulses are 2+ & equal.            No carotid bruit. No JVD.  No abdominal bruits. No femoral bruits. Abdomen: Bowel sounds are positive, abdomen soft and non-tender without masses or                  Hernia's noted. Msk:  Back normal, normal gait. Normal strength and tone for age. Extremities: No clubbing, cyanosis, pedal edema or pre-sacral edema.  DP +1 Neuro: Alert and oriented X 3. Psych:  Good affect, responds appropriately  TELEMETRY: Reviewed telemetry pt in: Atrial fib rates in the 60-70's.  ASSESSMENT AND PLAN: 1. Pulmonary edema: . Breathing status is improved. There is no peripheral edema. We will repeat chest x-ray to evaluate current lung condition. Unfortunately, BNP has risen from 2714 on admission to 2949 this a.m. Mrs. not indicative of overall status, however. Creatinine is stable and improving. Borderline hypokalemia. Will replete. We will change to oral Lasix, 40 mg daily, after this a.m. IV dose. I have again counseled her and her family concerning a low-sodium diet. Nutrition to see today. Will DC IV fluids as they are still on board.  2. Atrial fibrillation: Heart rate is well-controlled currently, without rapid rhythm during hospitalization. She continues on Coumadin per pharmacy direction with a stable and  therapeutic INR.  3. Chest pain: Do not believe this to be cardiac in etiology. Chest x-ray revealed a large hiatal hernia, which may have been the cause of her discomfort as she described it as a "ball tightening" in the middle of her chest. So far, she has not had any further complaints at this. This can be addressed as an outpatient. should this be a continuing issue.  4. Acute on chronic kidney disease:  Creatinine has decreased and is closer to her baseline value of 1.47 measured in 01/2010. Hopefully come renal function will continue to improve with current therapy.  Phill Myron. Purcell Nails  NP [facility] 11/26/2011, 9:21 AM   Cardiology Attending Patient interviewed and examined. Discussed with Jory Sims, NP.  Above note annotated and modified based upon my findings.  Family has some question as to what she was taking her furosemide as described, which was at a dose of 40 mg twice a day. Nonetheless, weight was not significantly above baseline when she was admitted, BNP level is not striking, and she has improved without much of a net diuresis.  She appears compensated at the present time and likely can be discharged in the morning.  Jacqulyn Ducking, MD 11/26/2011, 4:49 PM

## 2011-11-26 NOTE — Progress Notes (Signed)
Did not administer 0700 metoprolol due to low BP of 95/61. Will notify next shift RN. Marry Guan

## 2011-11-26 NOTE — Plan of Care (Signed)
Problem: Food- and Nutrition-Related Knowledge Deficit (NB-1.1) Goal: Nutrition education Formal process to instruct or train a patient/client in a skill or to impart knowledge to help patients/clients voluntarily manage or modify food choices and eating behavior to maintain or improve health.  Outcome: Adequate for Discharge Pt and caregivers present. Pt consuming diet high in sodium and fat prior to and during this admission. Rationale for diet provided. Label reading demonstrated in addition to importance of portion control and identifying foods high and low in sodium. Handouts: Low Sodium Nutrition Therapy provided as well. Pt doesn't like Ms Deliah Boston therefore we discussed alternatives. Understanding demonstrated by identifying 2 high sodium foods. Rec pt and caregivers follow-up with Outpatient Nutritionist for reinforcement of principles.

## 2011-11-26 NOTE — Progress Notes (Signed)
ANTICOAGULATION CONSULT NOTE -  Pharmacy Consult for Coumadin Indication: atrial fibrillation  No Known Allergies  Patient Measurements: Height: 5\' 8"  (172.7 cm) Weight: 218 lb 11.1 oz (99.2 kg) IBW/kg (Calculated) : 63.9   Vital Signs: Temp: 98.1 F (36.7 C) (01/24 0644) Temp src: Oral (01/24 0644) BP: 95/61 mmHg (01/24 0644) Pulse Rate: 60  (01/24 0644)  Labs:  Basename 11/26/11 0533 11/25/11 0444 11/24/11 0517 11/23/11 1954  HGB -- -- -- 11.7*  HCT -- -- -- 35.1*  PLT -- -- -- 228  APTT -- -- -- --  LABPROT 24.8* 25.7* 29.8* --  INR 2.20* 2.30* 2.78* --  HEPARINUNFRC -- -- -- --  CREATININE 1.83* 2.06* 1.91* --  CKTOTAL -- -- -- --  CKMB -- -- -- --  TROPONINI -- -- -- <0.30   Estimated Creatinine Clearance: 35.7 ml/min (by C-G formula based on Cr of 1.83).  Medical History: Past Medical History  Diagnosis Date  . Atrial fibrillation     EF of 45%  . Premature ventricular contractions   . Hyperlipidemia     Recent lipid profile is excellent without lipid-lowering therapy  . Degenerative joint disease     status post right total hip arthroplasty  . Mental retardation   . Glaucoma   . GERD (gastroesophageal reflux disease)     -Barrett's esophagus  . Gout   . Adenomatous polyps     -resected via colonoscopy in 2011  . Anemia   . Glaucoma   . Hypertension    Medications:  Scheduled:     . allopurinol  300 mg Oral Daily  . furosemide  40 mg Intravenous Q12H  . lisinopril  20 mg Oral Daily  . metoprolol succinate  100 mg Oral QPM  . metoprolol succinate  200 mg Oral Q0700  . pantoprazole  40 mg Oral Q1200  . polysaccharide iron  150 mg Oral Daily  . warfarin  4 mg Oral ONCE-1800  . warfarin  4 mg Oral ONCE-1800  . DISCONTD: furosemide  80 mg Oral BID  . DISCONTD: metoprolol tartrate  150 mg Oral BID  . DISCONTD: metoprolol tartrate  25 mg Oral BID   Assessment:  Therapeutic INR but trending down  AFib- continuation of home therapy. Home dose  is a three day cycle of 3mg , 3mg , 4 mg.   Goal of Therapy:   INR 2-3   Plan Coumadin 4mg  today INR daily Monitor CBC   Bhavana Kady A 11/26/2011,7:53 AM

## 2011-11-26 NOTE — Progress Notes (Signed)
[name], [name]                 ACCOUNT NO.:  [id number]  MEDICAL RECORD NO.:  [id number]  LOCATION:  [id number]                          FACILITY:  [facility]  PHYSICIAN:  Ayane Delancey G. Everette Rank, MD   DATE OF BIRTH:  1940/12/07  DATE OF PROCEDURE: DATE OF DISCHARGE:                                PROGRESS NOTE   This patient is feeling some better apparently, desiring to go home. She does have chronic atrial fibrillation and is on anticoagulation with a history of hypertension, iron-deficiency anemia, with elevated creatinine.  She did have elevated BNP and bilateral pulmonary edema. She was seen in consultation by Cardiology.  She has a known hiatal hernia, has left ventricular dysfunction based on previous echocardiogram.  They agree with diuresis and followup echocardiogram to assess left ventricular function and continue beta-blocker for rate control.  OBJECTIVE:  VITAL SIGNS:  Blood pressure 106/69, respirations 20, pulse 73, temp 97.4. LUNGS:  Clear to P and A. HEART:  Irregular rhythm. ABDOMEN:  No palpable organs or masses.  ASSESSMENT:  The patient does have atrial fibrillation with controlled rate.  She is on metoprolol twice a day and ACE inhibitor.  Does have a hiatal hernia.  Did have mild pulmonary edema.  Continues to diurese and is feeling better.  PLAN:  To continue current regimen.     Carollynn Pennywell G. Everette Rank, MD     AGM/MEDQ  D:  11/26/2011  T:  11/26/2011  Job:  [id number]

## 2011-11-27 MED ORDER — METOPROLOL SUCCINATE ER 200 MG PO TB24: 200.0000 mg | ORAL_TABLET | ORAL | Status: DC

## 2011-11-27 MED ORDER — POLYSACCHARIDE IRON 150 MG PO CAPS: 150.0000 mg | ORAL_CAPSULE | Freq: Every day | ORAL | Status: DC

## 2011-11-27 MED ORDER — PANTOPRAZOLE SODIUM 40 MG PO TBEC: 40.0000 mg | DELAYED_RELEASE_TABLET | Freq: Every day | ORAL | Status: DC

## 2011-11-27 NOTE — Progress Notes (Signed)
CARE MANAGEMENT NOTE 11/27/2011  Patient:  [name], [name]   Account Number:  [id number]  Date Initiated:  11/24/2011  Documentation initiated by:  [name]  Subjective/Objective Assessment:   Pt admitted with chest pain. PTA pt lived at home with family. [name] provides 56 hrs a month assistance.     Action/Plan:   Pt plans to DC home with no additional HH needs.   Anticipated DC Date:  11/27/2011   Anticipated DC Plan:  [place]  CM consult      Choice offered to / List presented to:             Status of service:  Completed, signed off Medicare Important Message given?   (If response is "NO", the following Medicare IM given date fields will be blank) Date Medicare IM given:   Date Additional Medicare IM given:    Discharge Disposition:    Per UR Regulation:    Comments:  11/27/11 [place] BSN CM Pt DC'd to home without [place] needs identified. Spoke with family about needs, again nothing identified  11/25/11 1000 Laura [place] Dellia Nims RN BSN CM Anticipate DC tomorrow. Family states they used [facility] in the past but do not anticipate HH needs. At family's request, CM called [name] to notify them of her admission and pending DC.  11/24/11 [place] BSN CM

## 2011-11-27 NOTE — Progress Notes (Signed)
Patient received discharge papers along with follow up appointments and prescriptions. Patients' daughter verbalized understanding of all instructions. Patient was escorted by staff via wheelchair to vehicle. Patient discharged to home in stable condition.

## 2011-11-27 NOTE — Discharge Summary (Signed)
[name], [name]                 ACCOUNT NO.:  [id number]  MEDICAL RECORD NO.:  [id number]  LOCATION:  [id number]                          FACILITY:  [facility]  PHYSICIAN:  Shia Delaine G. Everette Rank, MD   DATE OF BIRTH:  1941/06/21  DATE OF ADMISSION:  11/23/2011 DATE OF DISCHARGE:  01/25/2013LH                              DISCHARGE SUMMARY   DIAGNOSES: 1. Atrial fibrillation. 2. Congestive heart failure, systolic, acute on chronic. 3. Pulmonary edema. 4. Azotemia. 5. Hyperkalemia. 6. Hypertension. 7. Hyperlipidemia.  CONDITION:  Stable and improved at the time of discharge.  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  She had an episode of nausea and retching during the night.  Her pressure was down to systolic of 90.  This was a brief episode.  The patient did have some chest discomfort and was brought to emergency room by EMT.  Cardiac enzymes were negative.  She was found to have borderline minimal congestive heart failure per x-ray, but was hemodynamically stable, otherwise.  The patient does have worsening renal impairment with creatinine 2.13.  EXAMINATION ON ADMISSION:  VITAL SIGNS:  Blood pressure 124/91, temp 97.5, respiratory rate 20, O2 saturation 100% on room air. HEENT:  Eyes PERRLA TM negative.  Oropharynx benign. NECK:  Supple.  No JVD or thyroid abnormalities. LUNGS:  Diminished breath sounds bilaterally. HEART:  Irregular rhythm. ABDOMEN:  No palpable organs or masses. EXTREMITIES:  Trace edema 1+ bilaterally.  LABORATORY DATA:  CBC on admission, WBC 6700 with hemoglobin 11.7, hematocrit 35.1.  A BNP initially was 2714, subsequently 2949. Chemistries on admission:  Sodium 134, potassium 5.8, chloride 100, BUN 27, creatinine 2.13, calcium 9.7.  GFR 23.  Subsequent chemistries more normal with the latest chemistry:  Sodium 139, potassium 3.6, chloride 101, CO2 30, BUN 25, creatinine 1.83.  GFR  27.  X-rays:  Chest x-ray on admission showed chronic cardiomegaly, probable mild bilateral pulmonary edema and large hiatal hernia.  Subsequent chest x-ray showed slightly improved pulmonary edema, moderate-sized hiatal hernia.  HOSPITAL COURSE:  The patient on admission was started on IV fluids, as placed on nasal O2.  She was continued on the following medications: Zyloprim 300 mg daily, Prinivil 20 mg daily, metoprolol 200 mg daily and 100 mg at bedtime and Protonix 40 mg daily, Niferex capsules 150 mg daily p.r.n., Ultram tablets 50 mg.  The patient was placed on a low- sodium diet.  Her condition gradually improved during her hospital stay. The elevated creatinine initially, which was 2.06, returned to 1.83. The patient started diuresing.  She did have elevated BNP initially of 2714 and subsequently 2949.  She was seen in consultation by Cardiology. Their feeling was that she had controlled atrial fibrillation and mild pulmonary edema.  She began to diurese and symptomatically improved.  It was noted that she did have left ventricular dysfunction based on previous echocardiogram.  Cardiology agreed with diuresis, and followup echocardiogram to assess left ventricular dysfunction.  The patient showed continued improvement with less edema.  It was felt she could be discharged after 4 days hospitalization, being discharged on the following medications: 1. Zyloprim 100 mg daily. 2.  Nexium 40 mg daily. 3. Ferrous gluconate 324 mg daily. 4. Lasix 40 mg twice a day. 5. Prinivil (Zestril) 20 mg daily. 6. Metoprolol 200 mg 1 in a.m. and half tablet each p.m. 7. Protonix 40 mg daily. 8. Potassium chloride 20 mEq daily. 9. Warfarin 3 mg daily.  The patient's condition stable at the time of discharge.     [name], MD     AGM/MEDQ  D:  11/27/2011  T:  11/27/2011  Job:  [id number]

## 2012-01-06 ENCOUNTER — Other Ambulatory Visit: Payer: Self-pay | Admitting: *Deleted

## 2012-01-06 MED ORDER — LISINOPRIL 20 MG PO TABS: 20.0000 mg | ORAL_TABLET | Freq: Every day | ORAL | Status: DC

## 2012-02-02 ENCOUNTER — Other Ambulatory Visit: Payer: Self-pay | Admitting: Cardiology

## 2012-03-04 ENCOUNTER — Other Ambulatory Visit: Payer: Self-pay | Admitting: *Deleted

## 2012-06-03 ENCOUNTER — Other Ambulatory Visit: Payer: Self-pay | Admitting: Cardiology

## 2012-10-04 ENCOUNTER — Other Ambulatory Visit: Payer: Self-pay | Admitting: Cardiology

## 2012-10-04 MED ORDER — LISINOPRIL 20 MG PO TABS: 20.0000 mg | ORAL_TABLET | Freq: Every day | ORAL | Status: DC

## 2012-11-04 ENCOUNTER — Other Ambulatory Visit: Payer: Self-pay | Admitting: *Deleted

## 2012-11-04 MED ORDER — LISINOPRIL 20 MG PO TABS: 20.0000 mg | ORAL_TABLET | Freq: Every day | ORAL | Status: DC

## 2012-11-04 NOTE — Telephone Encounter (Signed)
Patient is past due for follow up with Dr Lattie Haw.  1 refill given and patient called with an appointment for 11/30/2012.

## 2012-11-30 ENCOUNTER — Ambulatory Visit: Payer: Medicare Other | Admitting: Cardiology

## 2012-12-20 ENCOUNTER — Ambulatory Visit (HOSPITAL_COMMUNITY)
Admission: RE | Admit: 2012-12-20 | Discharge: 2012-12-20 | Disposition: A | Discharge status: Home / Self Care | Payer: Medicare Other | Source: Ambulatory Visit | Attending: Cardiology | Admitting: Cardiology

## 2012-12-20 ENCOUNTER — Encounter: Payer: Self-pay | Admitting: Cardiology

## 2012-12-20 ENCOUNTER — Ambulatory Visit (INDEPENDENT_AMBULATORY_CARE_PROVIDER_SITE_OTHER): Payer: Medicaid Other | Admitting: Cardiology

## 2012-12-20 VITALS — BP 108/70 | HR 95 | Ht 68.0 in | Wt 210.0 lb

## 2012-12-20 DIAGNOSIS — F79 Unspecified intellectual disabilities: Secondary | ICD-10-CM

## 2012-12-20 DIAGNOSIS — I5023 Acute on chronic systolic (congestive) heart failure: Secondary | ICD-10-CM

## 2012-12-20 DIAGNOSIS — Z9189 Other specified personal risk factors, not elsewhere classified: Secondary | ICD-10-CM

## 2012-12-20 DIAGNOSIS — R0602 Shortness of breath: Secondary | ICD-10-CM | POA: Insufficient documentation

## 2012-12-20 DIAGNOSIS — I509 Heart failure, unspecified: Secondary | ICD-10-CM

## 2012-12-20 DIAGNOSIS — Z9289 Personal history of other medical treatment: Secondary | ICD-10-CM | POA: Insufficient documentation

## 2012-12-20 DIAGNOSIS — Z7901 Long term (current) use of anticoagulants: Secondary | ICD-10-CM

## 2012-12-20 DIAGNOSIS — D649 Anemia, unspecified: Secondary | ICD-10-CM | POA: Insufficient documentation

## 2012-12-20 DIAGNOSIS — H409 Unspecified glaucoma: Secondary | ICD-10-CM

## 2012-12-20 DIAGNOSIS — I10 Essential (primary) hypertension: Secondary | ICD-10-CM

## 2012-12-20 DIAGNOSIS — I4891 Unspecified atrial fibrillation: Secondary | ICD-10-CM

## 2012-12-20 NOTE — Progress Notes (Signed)
Patient ID: [name], female   DOB: Feb 10, 1941, 72 y.o.   MRN: [id number]  HPI: Schedule return visit for nice woman with a history of diastolic congestive heart failure and moderately severe chronic kidney disease.  Patient has a lifelong history of mental retardation, but is responsive to questions and contributes some information. Bulk of the history is provided by family. She has done well since hospitalization one year ago with no dyspnea nor other evidence for cardiac decompensation. She has developed no new medical problems.  Current Outpatient Prescriptions  Medication Sig Dispense Refill  . allopurinol (ZYLOPRIM) 100 MG tablet Take 100 mg by mouth daily.        . dorzolamide (TRUSOPT) 2 % ophthalmic solution Place 1 drop into both eyes 2 (two) times daily.        Marland Kitchen esomeprazole (NEXIUM) 40 MG capsule Take 40 mg by mouth daily before breakfast.        . ferrous gluconate (FERGON) 324 MG tablet TAKE ONE TABLET BY MOUTH TWICE DAILY WITH MEALS  60 tablet  12  . furosemide (LASIX) 20 MG tablet Take 40 mg by mouth 2 (two) times daily.       Marland Kitchen latanoprost (XALATAN) 0.005 % ophthalmic solution Place 1 drop into both eyes at bedtime.        Marland Kitchen lisinopril (PRINIVIL,ZESTRIL) 20 MG tablet Take 1 tablet (20 mg total) by mouth daily.  30 tablet  1  . loratadine (CLARITIN) 10 MG tablet Take 10 mg by mouth as needed for allergies.      . metoprolol (TOPROL-XL) 200 MG 24 hr tablet 1 tab am 1/2 tab pm  45 tablet  6  . potassium chloride SA (K-DUR,KLOR-CON) 20 MEQ tablet Take 20 mEq by mouth daily.       . sodium chloride (OCEAN) 0.65 % nasal spray Place 1 spray into the nose as needed. For nasal congestion      . traMADol (ULTRAM) 50 MG tablet Take 50 mg by mouth every 4 (four) hours as needed. For pain      . warfarin (COUMADIN) 3 MG tablet Take 3 mg by mouth as directed. Take one tablet (3mg ) by mouth daily for 2 days, then take Coumadin 4mg  (on tablet), daily for 1 day, etc..(2,1,2,1)      . warfarin  (COUMADIN) 4 MG tablet Take 4 mg by mouth as directed. Take Coumadin 3 mg (1 tablet) for 2 days, then take Coumadin 4 mg (1 tablet) for 1 day, (2,1,2, 1)       No current facility-administered medications for this visit.   No Known Allergies   Past medical history, social history, and family history reviewed and updated.  ROS: denies weight gain, orthopnea, PND, palpitations, lightheadedness or syncope. She walks with a walker and cover short distances without dyspnea or claudication.  PHYSICAL EXAM: BP 108/70  Pulse 95  Ht 5\' 8"  (1.727 m)  Wt 95.255 kg (210 lb)  BMI 31.94 kg/m2;  Body mass index is 31.94 kg/(m^2). General-Well developed; no acute distress Body habitus-mildly to moderately overweight Neck-No JVD; no carotid bruits Lungs-clear lung fields with markedly decreased breath sounds at the bases; resonant to percussion; mild kyphosis Cardiovascular-normal PMI; normal S1 and S2 Abdomen-normal bowel sounds; soft and non-tender without masses or organomegaly Musculoskeletal-No deformities, no cyanosis or clubbing Neurologic-Normal cranial nerves; symmetric strength and tone Skin-Warm, no significant lesions Extremities-distal pulses 1+ with minimal chronic stasis changes in the skin; Doppler detects normal sounding bi-/triphasic arterial signals; trace  edema  EKG:  Atrial fibrillation with a controlled ventricular rate of 95 bpm; low-voltage; right axis deviation; nonspecific T wave abnormality.   Jacqulyn Ducking, MD 12/20/2012  4:47 PM  ASSESSMENT AND PLAN

## 2012-12-20 NOTE — Progress Notes (Deleted)
Name: [name]    DOB: [birth year]  Age: 72 y.o.  MR#: [id number]       PCP:  Lanette Hampshire, MD      Insurance: Payor: MEDICARE  Plan: MEDICARE PART A AND B  Product Type: *No Product type*    CC:   BOTTLES LAST OV 03-24-11 RR, WAS ADVISED TO F/U IN 9 MONTHS STOOL CARDS NOT DONE ECHO DONE 11-25-11 PER ED VISIT 11-23-11 [facility] FOR RENAL INSUF.  VS Filed Vitals:   12/20/12 1355  BP: 108/70  Pulse: 95  Height: 5\' 8"  (1.727 m)  Weight: 210 lb (95.255 kg)    Weights Current Weight  12/20/12 210 lb (95.255 kg)  11/26/11 211 lb (95.709 kg)  03/24/11 208 lb (94.348 kg)    Blood Pressure  BP Readings from Last 3 Encounters:  12/20/12 108/70  11/27/11 98/64  03/24/11 113/75     Admit date:  (Not on file) Last encounter with RMR:  11/30/2012   Allergy Review of patient's allergies indicates no known allergies.  Current Outpatient Prescriptions  Medication Sig Dispense Refill  . allopurinol (ZYLOPRIM) 100 MG tablet Take 100 mg by mouth daily.        . dorzolamide (TRUSOPT) 2 % ophthalmic solution Place 1 drop into both eyes 2 (two) times daily.        Marland Kitchen esomeprazole (NEXIUM) 40 MG capsule Take 40 mg by mouth daily before breakfast.        . ferrous gluconate (FERGON) 324 MG tablet TAKE ONE TABLET BY MOUTH TWICE DAILY WITH MEALS  60 tablet  12  . furosemide (LASIX) 20 MG tablet Take 40 mg by mouth 2 (two) times daily.       Marland Kitchen latanoprost (XALATAN) 0.005 % ophthalmic solution Place 1 drop into both eyes at bedtime.        Marland Kitchen lisinopril (PRINIVIL,ZESTRIL) 20 MG tablet Take 1 tablet (20 mg total) by mouth daily.  30 tablet  1  . loratadine (CLARITIN) 10 MG tablet Take 10 mg by mouth as needed for allergies.      . metoprolol (TOPROL-XL) 200 MG 24 hr tablet 1 tab am 1/2 tab pm  45 tablet  6  . potassium chloride SA (K-DUR,KLOR-CON) 20 MEQ tablet Take 20 mEq by mouth daily.       . sodium chloride (OCEAN) 0.65 % nasal spray Place 1 spray into the nose as needed. For nasal congestion       . traMADol (ULTRAM) 50 MG tablet Take 50 mg by mouth every 4 (four) hours as needed. For pain      . warfarin (COUMADIN) 3 MG tablet Take 3 mg by mouth as directed. Take one tablet (3mg ) by mouth daily for 2 days, then take Coumadin 4mg  (on tablet), daily for 1 day, etc..(2,1,2,1)      . warfarin (COUMADIN) 4 MG tablet Take 4 mg by mouth as directed. Take Coumadin 3 mg (1 tablet) for 2 days, then take Coumadin 4 mg (1 tablet) for 1 day, (2,1,2, 1)       No current facility-administered medications for this visit.    Discontinued Meds:    Medications Discontinued During This Encounter  Medication Reason  . metoprolol (TOPROL-XL) 200 MG 24 hr tablet Error  . metoprolol succinate (TOPROL-XL) 200 MG 24 hr tablet Error  . polysaccharide iron (NIFEREX) 150 MG CAPS capsule Error  . pantoprazole (PROTONIX) 40 MG tablet Error    Patient Active Problem List  Diagnosis  .  MENTAL RETARDATION  . GLAUCOMA  . Degenerative joint disease  . GERD (gastroesophageal reflux disease)  . Gout  . Adenomatous polyps  . Atrial fibrillation  . Systolic dysfunction  . Acute on chronic systolic heart failure  . Chest pain    LABS @BMET3 @  @CMPRESULT3 @ @CBC3 @  Lipid Panel     Component Value Date/Time   CHOL 132 07/27/2009   TRIG 69 07/27/2009   HDL 43 07/27/2009   LDLCALC 75 07/27/2009    ABG No results found for this basename: phart, pco2, pco2art, po2, po2art, hco3, tco2, acidbasedef, o2sat     BNP (last 3 results) No results found for this basename: PROBNP,  in the last 8760 hours Cardiac Panel (last 3 results) No results found for this basename: CKTOTAL, CKMB, TROPONINI, RELINDX,  in the last 72 hours  Iron/TIBC/Ferritin    Component Value Date/Time   IRON 52 11/26/2011 0533   TIBC 293 11/26/2011 0533   FERRITIN 49 11/26/2011 0533     EKG Orders placed in visit on 12/20/12  . EKG 12-LEAD     Prior Assessment and Plan Problem List as of 12/20/2012     [id number]   Systolic  dysfunction   MENTAL RETARDATION   GLAUCOMA   Degenerative joint disease   GERD (gastroesophageal reflux disease)   Gout   Adenomatous polyps   Atrial fibrillation   Last Assessment & Plan   03/24/2011 Office Visit Written 03/24/2011 12:27 PM by Yehuda Savannah, MD     Atrial fibrillation persists with good control of heart rate.  Anticoagulation has been variable of late without any change in her medical therapy or diet.  I discussed the option of treatment with dabigatran with the patient's family.  For now, they elect to continue warfarin with dosing managed by Dr. Everette Rank.  She has had no apparent congestive heart failure despite mildly impaired left ventricular systolic function when last assessed.    Acute on chronic systolic heart failure   Chest pain       Imaging: No results found.

## 2012-12-20 NOTE — Assessment & Plan Note (Signed)
Chronic kidney disease has been fairly stable over the past few years. Electrolytes and renal function will be reassessed.

## 2012-12-20 NOTE — Patient Instructions (Addendum)
Your physician recommends that you schedule a follow-up appointment in: 6 months  Your physician recommends that you return for lab work in: [place] ([place]) BNP  A chest x-ray takes a picture of the organs and structures inside the chest, including the heart, lungs, and blood vessels. This test can show several things, including, whether the heart is enlarges; whether fluid is building up in the lungs; and whether pacemaker / defibrillator leads are still in place.  Your physician recommends that you weigh, daily, at the same time every day, and in the same amount of clothing. Please record your daily weights on the handout provided and bring it to your next appointment. CALL OFFICE WITH 4LB WEIGHT GAIN, DYSPNEA (SHORTNESS OF BREATH) OR INCREASE IN EDEMA   YOUR PHYSICIAN RECOMMENDS THAT YOU COMPLETE HEMOCCULT CARDS GIVEN TODAY AND RETURN TO OUR OFFICE WHEN COMPLETED

## 2012-12-20 NOTE — Assessment & Plan Note (Addendum)
No recurrence of decompensation of CHF since hospital admission one year ago. Status will be reassessed with a chest x-ray and BNP level.  Current therapy will be maintained.

## 2012-12-20 NOTE — Assessment & Plan Note (Signed)
Patient is experiencing no apparent symptoms related to her arrhythmia. We will continue strategy of long-term anticoagulation and control of ventricular rate.

## 2012-12-20 NOTE — Assessment & Plan Note (Signed)
No apparent complications related to anticoagulation as managed by Dr. Everette Rank.

## 2012-12-20 NOTE — Assessment & Plan Note (Addendum)
Although testing has not been diagnostic for iron deficiency, and no source for occult blood loss has been identified, anemia has apparently responded to iron replacement therapy in the past. CBC will be rechecked.

## 2012-12-21 ENCOUNTER — Encounter: Payer: Self-pay | Admitting: Cardiology

## 2012-12-21 LAB — BRAIN NATRIURETIC PEPTIDE: Brain Natriuretic Peptide: 90.6 pg/mL (ref 0.0–100.0)

## 2012-12-23 ENCOUNTER — Encounter: Payer: Self-pay | Admitting: *Deleted

## 2012-12-28 ENCOUNTER — Encounter: Payer: Self-pay | Admitting: Cardiology

## 2012-12-30 ENCOUNTER — Other Ambulatory Visit: Payer: Self-pay | Admitting: Cardiology

## 2013-02-03 ENCOUNTER — Other Ambulatory Visit: Payer: Self-pay | Admitting: Cardiology

## 2013-02-07 ENCOUNTER — Other Ambulatory Visit: Payer: Self-pay | Admitting: *Deleted

## 2013-02-07 MED ORDER — METOPROLOL SUCCINATE ER 200 MG PO TB24: 200.0000 mg | ORAL_TABLET | Freq: Every day | ORAL | Status: DC

## 2013-03-03 ENCOUNTER — Other Ambulatory Visit (INDEPENDENT_AMBULATORY_CARE_PROVIDER_SITE_OTHER): Payer: Self-pay | Admitting: Internal Medicine

## 2013-06-27 ENCOUNTER — Encounter: Payer: Self-pay | Admitting: Cardiovascular Disease

## 2013-06-27 ENCOUNTER — Ambulatory Visit (INDEPENDENT_AMBULATORY_CARE_PROVIDER_SITE_OTHER): Payer: Medicare Other | Admitting: Cardiovascular Disease

## 2013-06-27 VITALS — BP 96/64 | HR 87 | Ht 68.5 in | Wt 209.5 lb

## 2013-06-27 DIAGNOSIS — F79 Unspecified intellectual disabilities: Secondary | ICD-10-CM

## 2013-06-27 DIAGNOSIS — I5022 Chronic systolic (congestive) heart failure: Secondary | ICD-10-CM

## 2013-06-27 DIAGNOSIS — I10 Essential (primary) hypertension: Secondary | ICD-10-CM

## 2013-06-27 DIAGNOSIS — I4891 Unspecified atrial fibrillation: Secondary | ICD-10-CM

## 2013-06-27 NOTE — Patient Instructions (Addendum)
Your physician recommends that you schedule a follow-up appointment in: [place] physician recommends that you continue on your current medications as directed. Please refer to the Current Medication list given to you today.

## 2013-06-27 NOTE — Progress Notes (Signed)
Patient ID: OPIE LIAO, female   DOB: January 28, 1941, 72 y.o.   MRN: [id number]    SUBJECTIVE: [name] is a very nice woman with a history of systolic congestive heart failure, atrial fibrillation, large hiatal hernia, anemia, and moderately severe chronic kidney disease. She has a lifelong history of mental retardation, but is responsive to questions and contributes some information. Bulk of the history is provided by her sister. [name] lives in a trailer in her sister's backyard.  She gets dyspnea with walking but this has been stable and hasn't progressed. She occasionally has "hot spells" where she'll vomit and then feel better, and this happens maybe twice a month. Her sister hasn't noticed that the patient has been swollen in her legs or abdomen. She denies dizziness and headaches.  She's not had problems with anticoagulation/bleeding, and this monitored by Dr. Emilee Hero.  BP 96/64  Pulse 87    PHYSICAL EXAM General: NAD Neck: No JVD, no thyromegaly or thyroid nodule.  Lungs: Clear to auscultation bilaterally with normal respiratory effort. CV: Nondisplaced PMI.  Heart irregular rhythm, but rate controlled, normal S1/S2, no S3/S4, no murmur.  No peripheral edema.  No carotid bruit.  Normal pedal pulses.  Abdomen: Soft, nontender, no hepatosplenomegaly, no distention.  Neurologic: Alert and oriented x 3.  Psych: Normal affect. Extremities: No clubbing or cyanosis.     LABS: Basic Metabolic Panel: No results found for this basename: NA, K, CL, CO2, GLUCOSE, BUN, CREATININE, CALCIUM, MG, PHOS,  in the last 72 hours Liver Function Tests: No results found for this basename: AST, ALT, ALKPHOS, BILITOT, PROT, ALBUMIN,  in the last 72 hours No results found for this basename: LIPASE, AMYLASE,  in the last 72 hours CBC: No results found for this basename: WBC, NEUTROABS, HGB, HCT, MCV, PLT,  in the last 72 hours Cardiac Enzymes: No results found for this basename: CKTOTAL, CKMB,  CKMBINDEX, TROPONINI,  in the last 72 hours BNP: No components found with this basename: POCBNP,  D-Dimer: No results found for this basename: DDIMER,  in the last 72 hours Hemoglobin A1C: No results found for this basename: HGBA1C,  in the last 72 hours Fasting Lipid Panel: No results found for this basename: CHOL, HDL, LDLCALC, TRIG, CHOLHDL, LDLDIRECT,  in the last 72 hours Thyroid Function Tests: No results found for this basename: TSH, T4TOTAL, FREET3, T3FREE, THYROIDAB,  in the last 72 hours Anemia Panel: No results found for this basename: VITAMINB12, FOLATE, FERRITIN, TIBC, IRON, RETICCTPCT,  in the last 72 hours  Echo (January 2013): - Left ventricle: The cavity size was normal. Wall thickness was increased in a pattern of mild LVH. Systolic function was mildly reduced. The estimated ejection fraction was in the range of 45% to 50%. Diffuse hypokinesis. The study is not technically sufficient to allow evaluation of LV diastolic function - atrial fibrillation present. - Mitral valve: Mild regurgitation. - Left atrium: The atrium was moderately dilated. - Right atrium: The atrium was mildly dilated. - Tricuspid valve: Mild regurgitation. - Pericardium, extracardiac: Prominent epicardial fat pad versus small circumferential effusion with organization.    ASSESSMENT AND PLAN: 1. Atrial fibrillation: rate is controlled on current therapy. No changes in Metoprolol. Warfarin managed by Dr. Emilee Hero. 2. Chronic systolic heart failure: euvolemic and compensated. No changes in medications at this time, which include Metoprolol, Lisinopril, and Lasix.   Kate Sable, M.D., F.A.C.C.

## 2013-08-03 ENCOUNTER — Other Ambulatory Visit: Payer: Self-pay | Admitting: Cardiology

## 2013-08-03 ENCOUNTER — Other Ambulatory Visit (INDEPENDENT_AMBULATORY_CARE_PROVIDER_SITE_OTHER): Payer: Self-pay | Admitting: Internal Medicine

## 2013-11-01 ENCOUNTER — Telehealth: Payer: Self-pay | Admitting: Cardiology

## 2013-11-01 NOTE — Telephone Encounter (Signed)
Received fax refill request  Rx # [id number] Medication:  Ferrous Gluc 324 mg  Qty 60 Sig:  Take one tablet by mouth twice daily with meals Physician:  Domenic Polite

## 2013-11-07 DIAGNOSIS — I4891 Unspecified atrial fibrillation: Secondary | ICD-10-CM | POA: Diagnosis not present

## 2013-11-27 DIAGNOSIS — H524 Presbyopia: Secondary | ICD-10-CM | POA: Diagnosis not present

## 2013-11-27 DIAGNOSIS — H4011X0 Primary open-angle glaucoma, stage unspecified: Secondary | ICD-10-CM | POA: Diagnosis not present

## 2013-11-27 DIAGNOSIS — H47239 Glaucomatous optic atrophy, unspecified eye: Secondary | ICD-10-CM | POA: Diagnosis not present

## 2013-11-27 DIAGNOSIS — H02139 Senile ectropion of unspecified eye, unspecified eyelid: Secondary | ICD-10-CM | POA: Diagnosis not present

## 2013-11-27 DIAGNOSIS — H26499 Other secondary cataract, unspecified eye: Secondary | ICD-10-CM | POA: Diagnosis not present

## 2013-11-27 DIAGNOSIS — H409 Unspecified glaucoma: Secondary | ICD-10-CM | POA: Diagnosis not present

## 2013-11-30 ENCOUNTER — Other Ambulatory Visit: Payer: Self-pay | Admitting: Cardiology

## 2013-11-30 MED ORDER — FERROUS GLUCONATE 324 (38 FE) MG PO TABS: ORAL_TABLET | ORAL | Status: DC

## 2013-12-01 ENCOUNTER — Other Ambulatory Visit: Payer: Self-pay

## 2013-12-01 ENCOUNTER — Telehealth: Payer: Self-pay | Admitting: Cardiology

## 2013-12-01 MED ORDER — METOPROLOL SUCCINATE ER 200 MG PO TB24: 200.0000 mg | ORAL_TABLET | Freq: Every day | ORAL | Status: DC

## 2013-12-01 NOTE — Telephone Encounter (Signed)
Toprol-XL 200 mg daily should be sufficient.

## 2013-12-01 NOTE — Progress Notes (Signed)
Niece notified of metoprolol dose 200 mg daily

## 2013-12-01 NOTE — Telephone Encounter (Signed)
Received fax refill request  Rx #  Medication:  Metoprolol 200 mg ER Qty Take one tablet by mouth every morning and 1/2 tablet every evening Sig:  45 Physician:  Lattie Haw

## 2013-12-01 NOTE — Telephone Encounter (Signed)
rx refilled.

## 2013-12-01 NOTE — Telephone Encounter (Signed)
Please clarify which Metoprolol dose pt should take   thanks

## 2013-12-01 NOTE — Addendum Note (Signed)
Addended by: Barbarann Ehlers A on: 12/01/2013 01:16 PM   Modules accepted: Orders

## 2013-12-05 DIAGNOSIS — I4891 Unspecified atrial fibrillation: Secondary | ICD-10-CM | POA: Diagnosis not present

## 2014-01-02 DIAGNOSIS — I4891 Unspecified atrial fibrillation: Secondary | ICD-10-CM | POA: Diagnosis not present

## 2014-01-08 ENCOUNTER — Telehealth (INDEPENDENT_AMBULATORY_CARE_PROVIDER_SITE_OTHER): Payer: Self-pay | Admitting: *Deleted

## 2014-01-08 NOTE — Telephone Encounter (Signed)
PA completed over the phone for Nexium BID. [place]/ Medicare. A letter of approval is being sent. This has been approved through 121/31/15. [facility]/ [place] made aware.

## 2014-02-06 DIAGNOSIS — I4891 Unspecified atrial fibrillation: Secondary | ICD-10-CM | POA: Diagnosis not present

## 2014-02-06 DIAGNOSIS — Z7901 Long term (current) use of anticoagulants: Secondary | ICD-10-CM | POA: Diagnosis not present

## 2014-02-06 DIAGNOSIS — I509 Heart failure, unspecified: Secondary | ICD-10-CM | POA: Diagnosis not present

## 2014-02-27 DIAGNOSIS — I4891 Unspecified atrial fibrillation: Secondary | ICD-10-CM | POA: Diagnosis not present

## 2014-03-03 DIAGNOSIS — H4720 Unspecified optic atrophy: Secondary | ICD-10-CM | POA: Diagnosis not present

## 2014-03-03 DIAGNOSIS — H409 Unspecified glaucoma: Secondary | ICD-10-CM | POA: Diagnosis not present

## 2014-03-03 DIAGNOSIS — H04129 Dry eye syndrome of unspecified lacrimal gland: Secondary | ICD-10-CM | POA: Diagnosis not present

## 2014-03-03 DIAGNOSIS — H4011X0 Primary open-angle glaucoma, stage unspecified: Secondary | ICD-10-CM | POA: Diagnosis not present

## 2014-03-13 DIAGNOSIS — Z79899 Other long term (current) drug therapy: Secondary | ICD-10-CM | POA: Diagnosis not present

## 2014-03-13 DIAGNOSIS — Z7901 Long term (current) use of anticoagulants: Secondary | ICD-10-CM | POA: Diagnosis not present

## 2014-03-13 DIAGNOSIS — I4891 Unspecified atrial fibrillation: Secondary | ICD-10-CM | POA: Diagnosis not present

## 2014-03-13 DIAGNOSIS — D649 Anemia, unspecified: Secondary | ICD-10-CM | POA: Diagnosis not present

## 2014-03-13 DIAGNOSIS — N19 Unspecified kidney failure: Secondary | ICD-10-CM | POA: Diagnosis not present

## 2014-03-27 ENCOUNTER — Other Ambulatory Visit: Payer: Self-pay | Admitting: Cardiovascular Disease

## 2014-03-27 ENCOUNTER — Telehealth: Payer: Self-pay | Admitting: *Deleted

## 2014-03-27 ENCOUNTER — Other Ambulatory Visit (INDEPENDENT_AMBULATORY_CARE_PROVIDER_SITE_OTHER): Payer: Self-pay | Admitting: Internal Medicine

## 2014-03-27 MED ORDER — METOPROLOL SUCCINATE ER 200 MG PO TB24: 200.0000 mg | ORAL_TABLET | Freq: Every day | ORAL | Status: DC

## 2014-03-27 NOTE — Telephone Encounter (Signed)
METOPROLOL 200MG  ER

## 2014-03-27 NOTE — Telephone Encounter (Signed)
Medication sent via escribe.  

## 2014-03-28 ENCOUNTER — Other Ambulatory Visit: Payer: Self-pay | Admitting: Cardiovascular Disease

## 2014-03-30 DIAGNOSIS — I4891 Unspecified atrial fibrillation: Secondary | ICD-10-CM | POA: Diagnosis not present

## 2014-03-30 DIAGNOSIS — K2970 Gastritis, unspecified, without bleeding: Secondary | ICD-10-CM | POA: Diagnosis not present

## 2014-03-30 DIAGNOSIS — K2990 Gastroduodenitis, unspecified, without bleeding: Secondary | ICD-10-CM | POA: Diagnosis not present

## 2014-04-24 DIAGNOSIS — I4891 Unspecified atrial fibrillation: Secondary | ICD-10-CM | POA: Diagnosis not present

## 2014-05-22 DIAGNOSIS — I4891 Unspecified atrial fibrillation: Secondary | ICD-10-CM | POA: Diagnosis not present

## 2014-06-02 DIAGNOSIS — H409 Unspecified glaucoma: Secondary | ICD-10-CM | POA: Diagnosis not present

## 2014-06-02 DIAGNOSIS — H04129 Dry eye syndrome of unspecified lacrimal gland: Secondary | ICD-10-CM | POA: Diagnosis not present

## 2014-06-02 DIAGNOSIS — Z961 Presence of intraocular lens: Secondary | ICD-10-CM | POA: Diagnosis not present

## 2014-06-02 DIAGNOSIS — H4011X0 Primary open-angle glaucoma, stage unspecified: Secondary | ICD-10-CM | POA: Diagnosis not present

## 2014-06-19 DIAGNOSIS — I4891 Unspecified atrial fibrillation: Secondary | ICD-10-CM | POA: Diagnosis not present

## 2014-07-17 DIAGNOSIS — I4891 Unspecified atrial fibrillation: Secondary | ICD-10-CM | POA: Diagnosis not present

## 2014-07-21 DIAGNOSIS — H4011X0 Primary open-angle glaucoma, stage unspecified: Secondary | ICD-10-CM | POA: Diagnosis not present

## 2014-07-21 DIAGNOSIS — H409 Unspecified glaucoma: Secondary | ICD-10-CM | POA: Diagnosis not present

## 2014-07-23 ENCOUNTER — Other Ambulatory Visit: Payer: Self-pay | Admitting: Cardiovascular Disease

## 2014-08-14 DIAGNOSIS — Z23 Encounter for immunization: Secondary | ICD-10-CM | POA: Diagnosis not present

## 2014-08-14 DIAGNOSIS — I4891 Unspecified atrial fibrillation: Secondary | ICD-10-CM | POA: Diagnosis not present

## 2014-08-20 ENCOUNTER — Other Ambulatory Visit: Payer: Self-pay | Admitting: Cardiovascular Disease

## 2014-09-12 DIAGNOSIS — I482 Chronic atrial fibrillation: Secondary | ICD-10-CM | POA: Diagnosis not present

## 2014-09-19 ENCOUNTER — Other Ambulatory Visit (INDEPENDENT_AMBULATORY_CARE_PROVIDER_SITE_OTHER): Payer: Self-pay | Admitting: Internal Medicine

## 2014-09-19 ENCOUNTER — Other Ambulatory Visit: Payer: Self-pay | Admitting: Cardiovascular Disease

## 2014-10-09 DIAGNOSIS — I482 Chronic atrial fibrillation: Secondary | ICD-10-CM | POA: Diagnosis not present

## 2014-10-13 DIAGNOSIS — Z961 Presence of intraocular lens: Secondary | ICD-10-CM | POA: Diagnosis not present

## 2014-10-13 DIAGNOSIS — H409 Unspecified glaucoma: Secondary | ICD-10-CM | POA: Diagnosis not present

## 2014-10-13 DIAGNOSIS — H1859 Other hereditary corneal dystrophies: Secondary | ICD-10-CM | POA: Diagnosis not present

## 2014-10-13 DIAGNOSIS — H4011X3 Primary open-angle glaucoma, severe stage: Secondary | ICD-10-CM | POA: Diagnosis not present

## 2014-10-20 ENCOUNTER — Other Ambulatory Visit: Payer: Self-pay | Admitting: Cardiovascular Disease

## 2014-11-06 ENCOUNTER — Ambulatory Visit (INDEPENDENT_AMBULATORY_CARE_PROVIDER_SITE_OTHER): Payer: Medicare Other | Admitting: Cardiovascular Disease

## 2014-11-06 ENCOUNTER — Encounter: Payer: Self-pay | Admitting: Cardiovascular Disease

## 2014-11-06 VITALS — BP 106/50 | HR 73 | Ht 68.0 in | Wt 213.0 lb

## 2014-11-06 DIAGNOSIS — R42 Dizziness and giddiness: Secondary | ICD-10-CM | POA: Diagnosis not present

## 2014-11-06 DIAGNOSIS — I4891 Unspecified atrial fibrillation: Secondary | ICD-10-CM

## 2014-11-06 DIAGNOSIS — I5022 Chronic systolic (congestive) heart failure: Secondary | ICD-10-CM

## 2014-11-06 MED ORDER — LISINOPRIL 10 MG PO TABS: 10.0000 mg | ORAL_TABLET | Freq: Every day | ORAL | Status: DC

## 2014-11-06 NOTE — Patient Instructions (Signed)
Your physician wants you to follow-up in: 1 year with Dr.Koneswaran You will receive a reminder letter in the mail two months in advance. If you don't receive a letter, please call our office to schedule the follow-up appointment.     DECREASE Lisinopril to 10 mg daily      Thank you for choosing [facility] !

## 2014-11-06 NOTE — Progress Notes (Signed)
Patient ID: [name], female   DOB: [birth year], 74 y.o.   MRN: [id number]      SUBJECTIVE: [name] is a very nice woman with a history of chronic systolic congestive heart failure, atrial fibrillation, large hiatal hernia, anemia, and moderately severe chronic kidney disease. She has a lifelong history of mental retardation, but is responsive to questions and contributes some information. Bulk of the history is provided by her niece. [name] lives in a trailer in her sister's backyard.  The patient denies chest pain, leg swelling, palpitations, and shortness of breath. Her niece says she has done quite well over the past year, with the exception of dizzy spells which occur once or twice per month. Her BP is low during these times.  BP today 106/50, on lisinopril 20 mg daily. ECG today demonstrates atrial fibrillation with a controlled ventricular response.   Review of Systems: As per "subjective", otherwise negative.  No Known Allergies  Current Outpatient Prescriptions  Medication Sig Dispense Refill  . allopurinol (ZYLOPRIM) 100 MG tablet Take 100 mg by mouth daily.      . Brinzolamide-Brimonidine (SIMBRINZA) 1-0.2 % SUSP Apply 1 drop to eye 2 (two) times daily.    Marland Kitchen esomeprazole (NEXIUM) 40 MG capsule TAKE 1 CAPSULE BY MOUTH TWICE DAILY 60 capsule 5  . ferrous gluconate (FERGON) 324 MG tablet TAKE 1 TABLET BY MOUTH TWICE DAILY WITH MEALS 60 tablet 0  . furosemide (LASIX) 20 MG tablet Take 40 mg by mouth 2 (two) times daily.     Marland Kitchen latanoprost (XALATAN) 0.005 % ophthalmic solution Place 1 drop into both eyes at bedtime.      Marland Kitchen lisinopril (PRINIVIL,ZESTRIL) 20 MG tablet TAKE 1 TABLET BY MOUTH EVERY DAY - NEEDS APPOINTMENT FOR FURTHER REFILLS 30 tablet 1  . loratadine (CLARITIN) 10 MG tablet Take 10 mg by mouth as needed for allergies.    . metoprolol (TOPROL XL) 200 MG 24 hr tablet Take 1 tablet (200 mg total) by mouth daily. 90 tablet 3  . potassium chloride SA  (K-DUR,KLOR-CON) 20 MEQ tablet Take 20 mEq by mouth daily.     . traMADol (ULTRAM) 50 MG tablet Take 50 mg by mouth every 4 (four) hours as needed. For pain    . warfarin (COUMADIN) 2 MG tablet Take 2 mg by mouth daily. AS DIRECTED    . warfarin (COUMADIN) 3 MG tablet Take 3 mg by mouth as directed. Take one tablet (3mg ) by mouth daily for 2 days, then take Coumadin 4mg  (on tablet), daily for 1 day, etc..(2,1,2,1)     No current facility-administered medications for this visit.    Past Medical History  Diagnosis Date  . Atrial fibrillation     EF of 45%  . Premature ventricular contractions   . Hyperlipidemia     Recent lipid profile is excellent without lipid-lowering therapy  . Degenerative joint disease     status post right total hip arthroplasty  . Mental retardation   . Glaucoma   . GERD (gastroesophageal reflux disease)     -Barrett's esophagus  . Gout   . Adenomatous polyps     -resected via colonoscopy in 2011  . Anemia   . Glaucoma   . Hypertension     Past Surgical History  Procedure Laterality Date  . Total hip arthroplasty  2006    Right  . Cataract extraction  2006    left eye  . Colonoscopy w/ polypectomy  2011    History  Social History  . Marital Status: Single    Spouse Name: N/A    Number of Children: N/A  . Years of Education: N/A   Occupational History  . disabled    Social History Main Topics  . Smoking status: Never Smoker   . Smokeless tobacco: Never Used  . Alcohol Use: No  . Drug Use: No  . Sexual Activity: Not on file   Other Topics Concern  . Not on file   Social History Narrative     Filed Vitals:   11/06/14 1600  BP: 106/50  Pulse: 73  Height: 5\' 8"  (1.727 m)  Weight: 213 lb (96.616 kg)  SpO2: 95%    PHYSICAL EXAM General: NAD HEENT: Normal. Neck: No JVD, no thyromegaly. Lungs: Clear to auscultation bilaterally with normal respiratory effort. CV: Nondisplaced PMI.  Irregular rhythm, normal S1/S2, no S3, no  murmur. Trivial pretibial and periankle edema.  No carotid bruit.  Normal pedal pulses. Stasis dermatitis b/l.  Abdomen: Soft, nontender, no hepatosplenomegaly, no distention.  Neurologic: Alert and oriented x 3.  Psych: Normal affect. Skin: Stasis dermatitis b/l lower extremities. Musculoskeletal: No gross deformities. Extremities: No clubbing or cyanosis.   ECG: Most recent ECG reviewed.   ASSESSMENT AND PLAN: 1. Atrial fibrillation: Rate is controlled on current therapy. No changes to metoprolol dose. Warfarin managed by Dr. Emilee Hero. 2. Chronic systolic heart failure: Euvolemic and compensated. Given episodes of dizziness, will reduce lisinopril to 10 mg daily. 3. Dizzy spells: Possibly due to low normal BP. Will reduce lisinopril to 10 mg daily.  Dispo: f/u 1 year.   Kate Sable, M.D., F.A.C.C.

## 2014-11-07 DIAGNOSIS — I482 Chronic atrial fibrillation: Secondary | ICD-10-CM | POA: Diagnosis not present

## 2014-12-04 DIAGNOSIS — I482 Chronic atrial fibrillation: Secondary | ICD-10-CM | POA: Diagnosis not present

## 2014-12-26 ENCOUNTER — Telehealth (INDEPENDENT_AMBULATORY_CARE_PROVIDER_SITE_OTHER): Payer: Self-pay | Admitting: *Deleted

## 2014-12-26 NOTE — Telephone Encounter (Signed)
PA was completed for the Nexium 40 BID with [employer]. Called [phone] - Confirmation # [id number]. Dates of approval are January 2016 - December 2106. [facility]/Katherine made aware.

## 2015-01-01 DIAGNOSIS — I482 Chronic atrial fibrillation: Secondary | ICD-10-CM | POA: Diagnosis not present

## 2015-02-12 DIAGNOSIS — I482 Chronic atrial fibrillation: Secondary | ICD-10-CM | POA: Diagnosis not present

## 2015-03-12 DIAGNOSIS — I482 Chronic atrial fibrillation: Secondary | ICD-10-CM | POA: Diagnosis not present

## 2015-03-21 ENCOUNTER — Other Ambulatory Visit (INDEPENDENT_AMBULATORY_CARE_PROVIDER_SITE_OTHER): Payer: Self-pay | Admitting: Internal Medicine

## 2015-03-22 ENCOUNTER — Telehealth (INDEPENDENT_AMBULATORY_CARE_PROVIDER_SITE_OTHER): Payer: Self-pay | Admitting: Internal Medicine

## 2015-03-22 MED ORDER — ESOMEPRAZOLE MAGNESIUM 40 MG PO CPDR: DELAYED_RELEASE_CAPSULE | ORAL | Status: DC

## 2015-03-22 NOTE — Telephone Encounter (Signed)
Rx for Nexium filled

## 2015-04-09 DIAGNOSIS — I482 Chronic atrial fibrillation: Secondary | ICD-10-CM | POA: Diagnosis not present

## 2015-04-17 ENCOUNTER — Other Ambulatory Visit: Payer: Self-pay | Admitting: Cardiovascular Disease

## 2015-05-07 DIAGNOSIS — I482 Chronic atrial fibrillation: Secondary | ICD-10-CM | POA: Diagnosis not present

## 2015-06-04 DIAGNOSIS — I482 Chronic atrial fibrillation: Secondary | ICD-10-CM | POA: Diagnosis not present

## 2015-07-02 DIAGNOSIS — I482 Chronic atrial fibrillation: Secondary | ICD-10-CM | POA: Diagnosis not present

## 2015-07-15 ENCOUNTER — Other Ambulatory Visit (HOSPITAL_COMMUNITY): Payer: Self-pay | Admitting: Family Medicine

## 2015-07-15 ENCOUNTER — Ambulatory Visit (HOSPITAL_COMMUNITY)
Admission: RE | Admit: 2015-07-15 | Discharge: 2015-07-15 | Disposition: A | Discharge status: Home / Self Care | Payer: Medicare Other | Source: Ambulatory Visit | Attending: Family Medicine | Admitting: Family Medicine

## 2015-07-15 DIAGNOSIS — M25561 Pain in right knee: Secondary | ICD-10-CM

## 2015-07-30 ENCOUNTER — Other Ambulatory Visit (HOSPITAL_COMMUNITY): Payer: Self-pay | Admitting: Family Medicine

## 2015-07-30 DIAGNOSIS — I482 Chronic atrial fibrillation: Secondary | ICD-10-CM | POA: Diagnosis not present

## 2015-07-30 DIAGNOSIS — M25561 Pain in right knee: Secondary | ICD-10-CM

## 2015-08-12 ENCOUNTER — Ambulatory Visit (HOSPITAL_COMMUNITY)
Admission: RE | Admit: 2015-08-12 | Discharge: 2015-08-12 | Disposition: A | Discharge status: Home / Self Care | Payer: Medicare Other | Source: Ambulatory Visit | Attending: Family Medicine | Admitting: Family Medicine

## 2015-08-12 ENCOUNTER — Other Ambulatory Visit (HOSPITAL_COMMUNITY): Payer: Self-pay | Admitting: Family Medicine

## 2015-08-12 ENCOUNTER — Ambulatory Visit (HOSPITAL_COMMUNITY): Admission: RE | Admit: 2015-08-12 | Payer: Medicare Other | Source: Ambulatory Visit

## 2015-08-12 ENCOUNTER — Ambulatory Visit (HOSPITAL_COMMUNITY): Payer: Medicare Other

## 2015-08-12 DIAGNOSIS — M79604 Pain in right leg: Secondary | ICD-10-CM | POA: Diagnosis not present

## 2015-08-12 DIAGNOSIS — F4489 Other dissociative and conversion disorders: Secondary | ICD-10-CM | POA: Diagnosis not present

## 2015-08-12 DIAGNOSIS — R52 Pain, unspecified: Secondary | ICD-10-CM

## 2015-08-12 DIAGNOSIS — M25561 Pain in right knee: Secondary | ICD-10-CM

## 2015-08-12 DIAGNOSIS — Z743 Need for continuous supervision: Secondary | ICD-10-CM | POA: Diagnosis not present

## 2015-08-19 ENCOUNTER — Ambulatory Visit (INDEPENDENT_AMBULATORY_CARE_PROVIDER_SITE_OTHER): Payer: Medicare Other | Admitting: Orthopedic Surgery

## 2015-08-19 ENCOUNTER — Telehealth: Payer: Self-pay | Admitting: *Deleted

## 2015-08-19 VITALS — BP 103/70 | Ht 68.0 in | Wt 213.0 lb

## 2015-08-19 DIAGNOSIS — S8001XA Contusion of right knee, initial encounter: Secondary | ICD-10-CM

## 2015-08-19 NOTE — Addendum Note (Signed)
Addended by: Baldomero Lamy B on: 08/19/2015 04:38 PM   Modules accepted: Orders

## 2015-08-19 NOTE — Progress Notes (Signed)
Patient ID: [name], female   DOB: 1941-08-05, 74 y.o.   MRN: [id number]  Chief Complaint  Patient presents with  . Knee Pain    Right knee pain, no injury. Referred by Dr. Everette Rank.    HPI [name] is a 74 y.o. female. Bumped her right knee against the dashboard getting into a car about 4-6 weeks ago and then stop walking. This developed progressively over time. Complains of right knee pain and lack of extension. Complains of instability when walking. Complains of lateral proximal tibial pain. Mild severity dull ache.  Review of Systems Review of Systems   Pertinent review of systems we do not note or she does not no fever chills to suggest infection and there is no numbness or tingling  Past Medical History  Diagnosis Date  . Atrial fibrillation     EF of 45%  . Premature ventricular contractions   . Hyperlipidemia     Recent lipid profile is excellent without lipid-lowering therapy  . Degenerative joint disease     status post right total hip arthroplasty  . Mental retardation   . Glaucoma   . GERD (gastroesophageal reflux disease)     -Barrett's esophagus  . Gout   . Adenomatous polyps     -resected via colonoscopy in 2011  . Anemia   . Glaucoma   . Hypertension     Past Surgical History  Procedure Laterality Date  . Total hip arthroplasty  2006    Right  . Cataract extraction  2006    left eye  . Colonoscopy w/ polypectomy  2011    No family history on file.  Social History Social History  Substance Use Topics  . Smoking status: Never Smoker   . Smokeless tobacco: Never Used  . Alcohol Use: No    No Known Allergies  Current Outpatient Prescriptions  Medication Sig Dispense Refill  . allopurinol (ZYLOPRIM) 100 MG tablet Take 100 mg by mouth daily.      . Brinzolamide-Brimonidine (SIMBRINZA) 1-0.2 % SUSP Apply 1 drop to eye 2 (two) times daily.    Marland Kitchen esomeprazole (NEXIUM) 40 MG capsule TAKE ONE CAPSULE BY MOUTH TWO TIMES DAILY 60 capsule 5  .  ferrous gluconate (FERGON) 324 MG tablet TAKE 1 TABLET BY MOUTH TWICE DAILY WITH MEALS 60 tablet 0  . furosemide (LASIX) 20 MG tablet Take 40 mg by mouth 2 (two) times daily.     Marland Kitchen latanoprost (XALATAN) 0.005 % ophthalmic solution Place 1 drop into both eyes at bedtime.      Marland Kitchen lisinopril (PRINIVIL,ZESTRIL) 10 MG tablet Take 1 tablet (10 mg total) by mouth daily. 90 tablet 3  . loratadine (CLARITIN) 10 MG tablet Take 10 mg by mouth as needed for allergies.    . metoprolol (TOPROL-XL) 200 MG 24 hr tablet TAKE 1 TABLET BY MOUTH DAILY 90 tablet 3  . potassium chloride SA (K-DUR,KLOR-CON) 20 MEQ tablet Take 20 mEq by mouth daily.     . traMADol (ULTRAM) 50 MG tablet Take 50 mg by mouth every 4 (four) hours as needed. For pain    . warfarin (COUMADIN) 2 MG tablet Take 2 mg by mouth daily. AS DIRECTED    . warfarin (COUMADIN) 3 MG tablet Take 3 mg by mouth as directed. Take one tablet (3mg ) by mouth daily for 2 days, then take Coumadin 4mg  (on tablet), daily for 1 day, etc..(2,1,2,1)     No current facility-administered medications for this visit.  Physical Exam Physical Exam Blood pressure 103/70, height 5\' 8"  (1.727 m), weight 213 lb (96.616 kg). Appearance, there are no abnormalities in terms of appearance the patient was well-developed and well-nourished. The grooming and hygiene were normal.  Mental status orientation, there was normal alertness and orientation Mood pleasant Ambulatory status abnormal requires maximum assist and walker refuses to bear weight  Examination of the right knee Inspection proximal lateral tibial plateau tenderness no effusion Range of motion knee bends to 115 extends to 25 with passive motion active motion extension only to 40 Tests for stability were normal Motor strength  hip flexion strength was normal. I would not assess knee extension power at this point because of pain in inability to cooperate with verbal cues to extend the knee no extensor lag  is present however. Once I extend the knee she can hold it. Skin warm dry and intact without laceration or ulceration or erythema Neurologic examination normal sensation Vascular examination normal pulses with warm extremity and normal capillary refill  The opposite knee reveals no swelling or effusion, full range of motion, normal muscle tone, anterior and posterior drawer test stable neurovascular exam intact  Data Reviewed MRI was attempted unsuccessful patient to fidgety  X-rays show osteopenia valgus arthritis  Assessment  Injury mechanism that we know of is mild so I think she has a knee contusion and then developed stiffness after lack of movement secondary to pain   Plan  Economy hinged knee brace  Weight-bear as tolerated  On physical therapy for strengthening and gait training  Return 4 weeks

## 2015-08-19 NOTE — Telephone Encounter (Signed)
REFERRAL FAXED TO [facility]

## 2015-08-19 NOTE — Patient Instructions (Signed)
Home PT

## 2015-08-22 DIAGNOSIS — Z96641 Presence of right artificial hip joint: Secondary | ICD-10-CM | POA: Diagnosis not present

## 2015-08-22 DIAGNOSIS — I10 Essential (primary) hypertension: Secondary | ICD-10-CM | POA: Diagnosis not present

## 2015-08-22 DIAGNOSIS — Z7901 Long term (current) use of anticoagulants: Secondary | ICD-10-CM | POA: Diagnosis not present

## 2015-08-22 DIAGNOSIS — I4891 Unspecified atrial fibrillation: Secondary | ICD-10-CM | POA: Diagnosis not present

## 2015-08-22 DIAGNOSIS — R2689 Other abnormalities of gait and mobility: Secondary | ICD-10-CM | POA: Diagnosis not present

## 2015-08-22 DIAGNOSIS — F79 Unspecified intellectual disabilities: Secondary | ICD-10-CM | POA: Diagnosis not present

## 2015-08-22 DIAGNOSIS — Z993 Dependence on wheelchair: Secondary | ICD-10-CM | POA: Diagnosis not present

## 2015-08-22 DIAGNOSIS — S8001XD Contusion of right knee, subsequent encounter: Secondary | ICD-10-CM | POA: Diagnosis not present

## 2015-08-26 DIAGNOSIS — S8001XD Contusion of right knee, subsequent encounter: Secondary | ICD-10-CM | POA: Diagnosis not present

## 2015-08-26 DIAGNOSIS — R2689 Other abnormalities of gait and mobility: Secondary | ICD-10-CM | POA: Diagnosis not present

## 2015-08-26 DIAGNOSIS — I10 Essential (primary) hypertension: Secondary | ICD-10-CM | POA: Diagnosis not present

## 2015-08-26 DIAGNOSIS — I4891 Unspecified atrial fibrillation: Secondary | ICD-10-CM | POA: Diagnosis not present

## 2015-08-26 DIAGNOSIS — Z96641 Presence of right artificial hip joint: Secondary | ICD-10-CM | POA: Diagnosis not present

## 2015-08-26 DIAGNOSIS — F79 Unspecified intellectual disabilities: Secondary | ICD-10-CM | POA: Diagnosis not present

## 2015-08-28 DIAGNOSIS — R2689 Other abnormalities of gait and mobility: Secondary | ICD-10-CM | POA: Diagnosis not present

## 2015-08-28 DIAGNOSIS — F79 Unspecified intellectual disabilities: Secondary | ICD-10-CM | POA: Diagnosis not present

## 2015-08-28 DIAGNOSIS — Z96641 Presence of right artificial hip joint: Secondary | ICD-10-CM | POA: Diagnosis not present

## 2015-08-28 DIAGNOSIS — I10 Essential (primary) hypertension: Secondary | ICD-10-CM | POA: Diagnosis not present

## 2015-08-28 DIAGNOSIS — S8001XD Contusion of right knee, subsequent encounter: Secondary | ICD-10-CM | POA: Diagnosis not present

## 2015-08-28 DIAGNOSIS — I4891 Unspecified atrial fibrillation: Secondary | ICD-10-CM | POA: Diagnosis not present

## 2015-08-29 DIAGNOSIS — I482 Chronic atrial fibrillation: Secondary | ICD-10-CM | POA: Diagnosis not present

## 2015-09-02 DIAGNOSIS — F79 Unspecified intellectual disabilities: Secondary | ICD-10-CM | POA: Diagnosis not present

## 2015-09-02 DIAGNOSIS — S8001XD Contusion of right knee, subsequent encounter: Secondary | ICD-10-CM | POA: Diagnosis not present

## 2015-09-02 DIAGNOSIS — I4891 Unspecified atrial fibrillation: Secondary | ICD-10-CM | POA: Diagnosis not present

## 2015-09-02 DIAGNOSIS — I10 Essential (primary) hypertension: Secondary | ICD-10-CM | POA: Diagnosis not present

## 2015-09-02 DIAGNOSIS — R2689 Other abnormalities of gait and mobility: Secondary | ICD-10-CM | POA: Diagnosis not present

## 2015-09-02 DIAGNOSIS — Z96641 Presence of right artificial hip joint: Secondary | ICD-10-CM | POA: Diagnosis not present

## 2015-09-03 DIAGNOSIS — H401113 Primary open-angle glaucoma, right eye, severe stage: Secondary | ICD-10-CM | POA: Diagnosis not present

## 2015-09-03 DIAGNOSIS — H401123 Primary open-angle glaucoma, left eye, severe stage: Secondary | ICD-10-CM | POA: Diagnosis not present

## 2015-09-04 DIAGNOSIS — S8001XD Contusion of right knee, subsequent encounter: Secondary | ICD-10-CM | POA: Diagnosis not present

## 2015-09-04 DIAGNOSIS — I10 Essential (primary) hypertension: Secondary | ICD-10-CM | POA: Diagnosis not present

## 2015-09-04 DIAGNOSIS — R2689 Other abnormalities of gait and mobility: Secondary | ICD-10-CM | POA: Diagnosis not present

## 2015-09-04 DIAGNOSIS — Z96641 Presence of right artificial hip joint: Secondary | ICD-10-CM | POA: Diagnosis not present

## 2015-09-04 DIAGNOSIS — F79 Unspecified intellectual disabilities: Secondary | ICD-10-CM | POA: Diagnosis not present

## 2015-09-04 DIAGNOSIS — I4891 Unspecified atrial fibrillation: Secondary | ICD-10-CM | POA: Diagnosis not present

## 2015-09-05 DIAGNOSIS — Z23 Encounter for immunization: Secondary | ICD-10-CM | POA: Diagnosis not present

## 2015-09-05 DIAGNOSIS — I482 Chronic atrial fibrillation: Secondary | ICD-10-CM | POA: Diagnosis not present

## 2015-09-09 DIAGNOSIS — F79 Unspecified intellectual disabilities: Secondary | ICD-10-CM | POA: Diagnosis not present

## 2015-09-09 DIAGNOSIS — R2689 Other abnormalities of gait and mobility: Secondary | ICD-10-CM | POA: Diagnosis not present

## 2015-09-09 DIAGNOSIS — S8001XD Contusion of right knee, subsequent encounter: Secondary | ICD-10-CM | POA: Diagnosis not present

## 2015-09-09 DIAGNOSIS — Z96641 Presence of right artificial hip joint: Secondary | ICD-10-CM | POA: Diagnosis not present

## 2015-09-09 DIAGNOSIS — I4891 Unspecified atrial fibrillation: Secondary | ICD-10-CM | POA: Diagnosis not present

## 2015-09-09 DIAGNOSIS — I10 Essential (primary) hypertension: Secondary | ICD-10-CM | POA: Diagnosis not present

## 2015-09-11 DIAGNOSIS — I4891 Unspecified atrial fibrillation: Secondary | ICD-10-CM | POA: Diagnosis not present

## 2015-09-11 DIAGNOSIS — I10 Essential (primary) hypertension: Secondary | ICD-10-CM | POA: Diagnosis not present

## 2015-09-11 DIAGNOSIS — F79 Unspecified intellectual disabilities: Secondary | ICD-10-CM | POA: Diagnosis not present

## 2015-09-11 DIAGNOSIS — Z96641 Presence of right artificial hip joint: Secondary | ICD-10-CM | POA: Diagnosis not present

## 2015-09-11 DIAGNOSIS — S8001XD Contusion of right knee, subsequent encounter: Secondary | ICD-10-CM | POA: Diagnosis not present

## 2015-09-11 DIAGNOSIS — R2689 Other abnormalities of gait and mobility: Secondary | ICD-10-CM | POA: Diagnosis not present

## 2015-09-16 ENCOUNTER — Ambulatory Visit: Payer: Medicare Other | Admitting: Orthopedic Surgery

## 2015-09-16 DIAGNOSIS — Z96641 Presence of right artificial hip joint: Secondary | ICD-10-CM | POA: Diagnosis not present

## 2015-09-16 DIAGNOSIS — I4891 Unspecified atrial fibrillation: Secondary | ICD-10-CM | POA: Diagnosis not present

## 2015-09-16 DIAGNOSIS — S8001XD Contusion of right knee, subsequent encounter: Secondary | ICD-10-CM | POA: Diagnosis not present

## 2015-09-16 DIAGNOSIS — R2689 Other abnormalities of gait and mobility: Secondary | ICD-10-CM | POA: Diagnosis not present

## 2015-09-16 DIAGNOSIS — I10 Essential (primary) hypertension: Secondary | ICD-10-CM | POA: Diagnosis not present

## 2015-09-16 DIAGNOSIS — F79 Unspecified intellectual disabilities: Secondary | ICD-10-CM | POA: Diagnosis not present

## 2015-09-20 DIAGNOSIS — S8001XD Contusion of right knee, subsequent encounter: Secondary | ICD-10-CM | POA: Diagnosis not present

## 2015-09-20 DIAGNOSIS — I4891 Unspecified atrial fibrillation: Secondary | ICD-10-CM | POA: Diagnosis not present

## 2015-09-20 DIAGNOSIS — Z96641 Presence of right artificial hip joint: Secondary | ICD-10-CM | POA: Diagnosis not present

## 2015-09-20 DIAGNOSIS — F79 Unspecified intellectual disabilities: Secondary | ICD-10-CM | POA: Diagnosis not present

## 2015-09-20 DIAGNOSIS — I10 Essential (primary) hypertension: Secondary | ICD-10-CM | POA: Diagnosis not present

## 2015-09-20 DIAGNOSIS — R2689 Other abnormalities of gait and mobility: Secondary | ICD-10-CM | POA: Diagnosis not present

## 2015-09-23 DIAGNOSIS — I4891 Unspecified atrial fibrillation: Secondary | ICD-10-CM | POA: Diagnosis not present

## 2015-09-23 DIAGNOSIS — S8001XD Contusion of right knee, subsequent encounter: Secondary | ICD-10-CM | POA: Diagnosis not present

## 2015-09-23 DIAGNOSIS — I10 Essential (primary) hypertension: Secondary | ICD-10-CM | POA: Diagnosis not present

## 2015-09-23 DIAGNOSIS — Z96641 Presence of right artificial hip joint: Secondary | ICD-10-CM | POA: Diagnosis not present

## 2015-09-23 DIAGNOSIS — F79 Unspecified intellectual disabilities: Secondary | ICD-10-CM | POA: Diagnosis not present

## 2015-09-23 DIAGNOSIS — R2689 Other abnormalities of gait and mobility: Secondary | ICD-10-CM | POA: Diagnosis not present

## 2015-09-24 ENCOUNTER — Ambulatory Visit: Payer: Medicare Other | Admitting: Orthopedic Surgery

## 2015-09-25 DIAGNOSIS — Z96641 Presence of right artificial hip joint: Secondary | ICD-10-CM | POA: Diagnosis not present

## 2015-09-25 DIAGNOSIS — I4891 Unspecified atrial fibrillation: Secondary | ICD-10-CM | POA: Diagnosis not present

## 2015-09-25 DIAGNOSIS — S8001XD Contusion of right knee, subsequent encounter: Secondary | ICD-10-CM | POA: Diagnosis not present

## 2015-09-25 DIAGNOSIS — R2689 Other abnormalities of gait and mobility: Secondary | ICD-10-CM | POA: Diagnosis not present

## 2015-09-25 DIAGNOSIS — F79 Unspecified intellectual disabilities: Secondary | ICD-10-CM | POA: Diagnosis not present

## 2015-09-25 DIAGNOSIS — I10 Essential (primary) hypertension: Secondary | ICD-10-CM | POA: Diagnosis not present

## 2015-10-03 ENCOUNTER — Ambulatory Visit (INDEPENDENT_AMBULATORY_CARE_PROVIDER_SITE_OTHER): Payer: Medicare Other | Admitting: Orthopedic Surgery

## 2015-10-03 ENCOUNTER — Ambulatory Visit: Payer: Medicare Other | Admitting: Orthopedic Surgery

## 2015-10-03 VITALS — BP 101/64 | Ht 68.0 in | Wt 213.0 lb

## 2015-10-03 DIAGNOSIS — S8001XD Contusion of right knee, subsequent encounter: Secondary | ICD-10-CM | POA: Diagnosis not present

## 2015-10-03 DIAGNOSIS — I482 Chronic atrial fibrillation: Secondary | ICD-10-CM | POA: Diagnosis not present

## 2015-10-03 NOTE — Patient Instructions (Signed)
We will re order home health therapy

## 2015-10-03 NOTE — Progress Notes (Signed)
Follow up   Chief Complaint  Patient presents with  . Follow-up    4 week follow up right knee contusion, DOI 08/12/15   [name] is a 74 y.o. female. Bumped her right knee against the dashboard getting into a car about 4-6 weeks ago and then stop walking. This developed progressively over time. Complains of right knee pain and lack of extension. Complains of instability when walking. Complains of lateral proximal tibial pain. Mild severity dull ache.  We treated her with economy hinged wrap on knee brace and 4 weeks of therapy she did well and then fell again comes in complaining of lateral knee pain tenderness and inability to extend the knee  Review of systems no fever or chills no neurologic symptoms  Exam shows no joint effusion but tenderness in the lateral compartment range of motion stops at 50 flexes to 95 no atrophy knee feels stable neurovascular exam is intact BP 101/64 mmHg  Ht 5\' 8"  (1.727 m)  Wt 213 lb (96.616 kg)  BMI 32.39 kg/m2  She can have MRI because she won't sit still she would have to go to [place] to get sedated and she will do that so we will try home physical therapy again with [facility] follow-up 4 weeks continue bracing weight-bear as tolerated

## 2015-10-07 ENCOUNTER — Telehealth: Payer: Self-pay | Admitting: Orthopedic Surgery

## 2015-10-07 NOTE — Telephone Encounter (Signed)
Patient's niece(caregiver and designated contact) asking to now go ahead with ordering MRI, as per last note, in [place] and with sedation.  [phone]

## 2015-10-08 DIAGNOSIS — Z7901 Long term (current) use of anticoagulants: Secondary | ICD-10-CM | POA: Diagnosis not present

## 2015-10-08 DIAGNOSIS — I4891 Unspecified atrial fibrillation: Secondary | ICD-10-CM | POA: Diagnosis not present

## 2015-10-08 DIAGNOSIS — M6281 Muscle weakness (generalized): Secondary | ICD-10-CM | POA: Diagnosis not present

## 2015-10-08 DIAGNOSIS — Z96641 Presence of right artificial hip joint: Secondary | ICD-10-CM | POA: Diagnosis not present

## 2015-10-08 DIAGNOSIS — R2689 Other abnormalities of gait and mobility: Secondary | ICD-10-CM | POA: Diagnosis not present

## 2015-10-08 DIAGNOSIS — S8001XD Contusion of right knee, subsequent encounter: Secondary | ICD-10-CM | POA: Diagnosis not present

## 2015-10-08 DIAGNOSIS — I10 Essential (primary) hypertension: Secondary | ICD-10-CM | POA: Diagnosis not present

## 2015-10-08 DIAGNOSIS — F79 Unspecified intellectual disabilities: Secondary | ICD-10-CM | POA: Diagnosis not present

## 2015-10-08 NOTE — Telephone Encounter (Signed)
This has to be done in [place] with her doctor providing advice on sedation (Dr Everette Rank)  So I cant order it

## 2015-10-08 NOTE — Telephone Encounter (Signed)
Do you want to proceed with this?

## 2015-10-10 DIAGNOSIS — F79 Unspecified intellectual disabilities: Secondary | ICD-10-CM | POA: Diagnosis not present

## 2015-10-10 DIAGNOSIS — I4891 Unspecified atrial fibrillation: Secondary | ICD-10-CM | POA: Diagnosis not present

## 2015-10-10 DIAGNOSIS — S8001XD Contusion of right knee, subsequent encounter: Secondary | ICD-10-CM | POA: Diagnosis not present

## 2015-10-10 DIAGNOSIS — R2689 Other abnormalities of gait and mobility: Secondary | ICD-10-CM | POA: Diagnosis not present

## 2015-10-10 DIAGNOSIS — M6281 Muscle weakness (generalized): Secondary | ICD-10-CM | POA: Diagnosis not present

## 2015-10-10 DIAGNOSIS — I10 Essential (primary) hypertension: Secondary | ICD-10-CM | POA: Diagnosis not present

## 2015-10-10 NOTE — Telephone Encounter (Signed)
Please call [name] about patient's MRI, [name] is requesting a call back by 1:00. Please advise

## 2015-10-10 NOTE — Telephone Encounter (Signed)
Niece aware 

## 2015-10-11 DIAGNOSIS — I4891 Unspecified atrial fibrillation: Secondary | ICD-10-CM | POA: Diagnosis not present

## 2015-10-11 DIAGNOSIS — F79 Unspecified intellectual disabilities: Secondary | ICD-10-CM | POA: Diagnosis not present

## 2015-10-11 DIAGNOSIS — M6281 Muscle weakness (generalized): Secondary | ICD-10-CM | POA: Diagnosis not present

## 2015-10-11 DIAGNOSIS — R2689 Other abnormalities of gait and mobility: Secondary | ICD-10-CM | POA: Diagnosis not present

## 2015-10-11 DIAGNOSIS — I10 Essential (primary) hypertension: Secondary | ICD-10-CM | POA: Diagnosis not present

## 2015-10-11 DIAGNOSIS — S8001XD Contusion of right knee, subsequent encounter: Secondary | ICD-10-CM | POA: Diagnosis not present

## 2015-10-15 DIAGNOSIS — F79 Unspecified intellectual disabilities: Secondary | ICD-10-CM | POA: Diagnosis not present

## 2015-10-15 DIAGNOSIS — R2689 Other abnormalities of gait and mobility: Secondary | ICD-10-CM | POA: Diagnosis not present

## 2015-10-15 DIAGNOSIS — M6281 Muscle weakness (generalized): Secondary | ICD-10-CM | POA: Diagnosis not present

## 2015-10-15 DIAGNOSIS — S8001XD Contusion of right knee, subsequent encounter: Secondary | ICD-10-CM | POA: Diagnosis not present

## 2015-10-15 DIAGNOSIS — I10 Essential (primary) hypertension: Secondary | ICD-10-CM | POA: Diagnosis not present

## 2015-10-15 DIAGNOSIS — I4891 Unspecified atrial fibrillation: Secondary | ICD-10-CM | POA: Diagnosis not present

## 2015-10-16 DIAGNOSIS — S8001XD Contusion of right knee, subsequent encounter: Secondary | ICD-10-CM | POA: Diagnosis not present

## 2015-10-16 DIAGNOSIS — I10 Essential (primary) hypertension: Secondary | ICD-10-CM | POA: Diagnosis not present

## 2015-10-16 DIAGNOSIS — R2689 Other abnormalities of gait and mobility: Secondary | ICD-10-CM | POA: Diagnosis not present

## 2015-10-16 DIAGNOSIS — M6281 Muscle weakness (generalized): Secondary | ICD-10-CM | POA: Diagnosis not present

## 2015-10-16 DIAGNOSIS — I4891 Unspecified atrial fibrillation: Secondary | ICD-10-CM | POA: Diagnosis not present

## 2015-10-16 DIAGNOSIS — F79 Unspecified intellectual disabilities: Secondary | ICD-10-CM | POA: Diagnosis not present

## 2015-10-17 NOTE — Telephone Encounter (Signed)
Patient's niece [name], designated contact, has called back to relay that they have contacted PCP office, Dr Everette Rank, and was advised that Dr Everette Rank will not be returning to his practice.  Patients will be further advised regarding transferring their care.  Patient and niece "do not know what to do now about the MRI" (per Dr Ruthe Mannan previous advice that he cannot provide advice on sedation for MRI).  Patient's next scheduled appointment is 10/31/15.  Wait until this appointment to further discuss, or any recommendations or other option at this time?  Patient's Home Ph# [phone]

## 2015-10-17 NOTE — Telephone Encounter (Signed)
Routing to Dr. Harrison to advise 

## 2015-10-18 DIAGNOSIS — F79 Unspecified intellectual disabilities: Secondary | ICD-10-CM | POA: Diagnosis not present

## 2015-10-18 DIAGNOSIS — I4891 Unspecified atrial fibrillation: Secondary | ICD-10-CM | POA: Diagnosis not present

## 2015-10-18 DIAGNOSIS — I10 Essential (primary) hypertension: Secondary | ICD-10-CM | POA: Diagnosis not present

## 2015-10-18 DIAGNOSIS — S8001XD Contusion of right knee, subsequent encounter: Secondary | ICD-10-CM | POA: Diagnosis not present

## 2015-10-18 DIAGNOSIS — R2689 Other abnormalities of gait and mobility: Secondary | ICD-10-CM | POA: Diagnosis not present

## 2015-10-18 DIAGNOSIS — M6281 Muscle weakness (generalized): Secondary | ICD-10-CM | POA: Diagnosis not present

## 2015-10-21 NOTE — Telephone Encounter (Signed)
Call [facility] for assistance in ordering the sedation

## 2015-10-22 DIAGNOSIS — M6281 Muscle weakness (generalized): Secondary | ICD-10-CM | POA: Diagnosis not present

## 2015-10-22 DIAGNOSIS — F79 Unspecified intellectual disabilities: Secondary | ICD-10-CM | POA: Diagnosis not present

## 2015-10-22 DIAGNOSIS — R2689 Other abnormalities of gait and mobility: Secondary | ICD-10-CM | POA: Diagnosis not present

## 2015-10-22 DIAGNOSIS — I10 Essential (primary) hypertension: Secondary | ICD-10-CM | POA: Diagnosis not present

## 2015-10-22 DIAGNOSIS — I4891 Unspecified atrial fibrillation: Secondary | ICD-10-CM | POA: Diagnosis not present

## 2015-10-22 DIAGNOSIS — S8001XD Contusion of right knee, subsequent encounter: Secondary | ICD-10-CM | POA: Diagnosis not present

## 2015-10-23 ENCOUNTER — Other Ambulatory Visit: Payer: Self-pay | Admitting: *Deleted

## 2015-10-23 DIAGNOSIS — S83206D Unspecified tear of unspecified meniscus, current injury, right knee, subsequent encounter: Secondary | ICD-10-CM

## 2015-10-23 NOTE — Telephone Encounter (Signed)
MRI ordered, Renee to precert and schedule

## 2015-10-23 NOTE — Telephone Encounter (Signed)
Yes Let me know if i need to do anything

## 2015-10-23 NOTE — Telephone Encounter (Signed)
 offers general anesthesia for MRI, may i order MRI for there and they handle that there?

## 2015-10-24 DIAGNOSIS — S8001XD Contusion of right knee, subsequent encounter: Secondary | ICD-10-CM | POA: Diagnosis not present

## 2015-10-24 DIAGNOSIS — R2689 Other abnormalities of gait and mobility: Secondary | ICD-10-CM | POA: Diagnosis not present

## 2015-10-24 DIAGNOSIS — I10 Essential (primary) hypertension: Secondary | ICD-10-CM | POA: Diagnosis not present

## 2015-10-24 DIAGNOSIS — F79 Unspecified intellectual disabilities: Secondary | ICD-10-CM | POA: Diagnosis not present

## 2015-10-24 DIAGNOSIS — M6281 Muscle weakness (generalized): Secondary | ICD-10-CM | POA: Diagnosis not present

## 2015-10-24 DIAGNOSIS — I4891 Unspecified atrial fibrillation: Secondary | ICD-10-CM | POA: Diagnosis not present

## 2015-10-29 DIAGNOSIS — Z5181 Encounter for therapeutic drug level monitoring: Secondary | ICD-10-CM | POA: Diagnosis not present

## 2015-10-29 DIAGNOSIS — F79 Unspecified intellectual disabilities: Secondary | ICD-10-CM | POA: Diagnosis not present

## 2015-10-29 DIAGNOSIS — R2689 Other abnormalities of gait and mobility: Secondary | ICD-10-CM | POA: Diagnosis not present

## 2015-10-29 DIAGNOSIS — I10 Essential (primary) hypertension: Secondary | ICD-10-CM | POA: Diagnosis not present

## 2015-10-29 DIAGNOSIS — F71 Moderate intellectual disabilities: Secondary | ICD-10-CM | POA: Diagnosis not present

## 2015-10-29 DIAGNOSIS — S8001XD Contusion of right knee, subsequent encounter: Secondary | ICD-10-CM | POA: Diagnosis not present

## 2015-10-29 DIAGNOSIS — E559 Vitamin D deficiency, unspecified: Secondary | ICD-10-CM | POA: Diagnosis not present

## 2015-10-29 DIAGNOSIS — M6281 Muscle weakness (generalized): Secondary | ICD-10-CM | POA: Diagnosis not present

## 2015-10-29 DIAGNOSIS — N182 Chronic kidney disease, stage 2 (mild): Secondary | ICD-10-CM | POA: Diagnosis not present

## 2015-10-29 DIAGNOSIS — I4891 Unspecified atrial fibrillation: Secondary | ICD-10-CM | POA: Diagnosis not present

## 2015-10-29 DIAGNOSIS — E785 Hyperlipidemia, unspecified: Secondary | ICD-10-CM | POA: Diagnosis not present

## 2015-10-30 DIAGNOSIS — I10 Essential (primary) hypertension: Secondary | ICD-10-CM | POA: Diagnosis not present

## 2015-10-30 DIAGNOSIS — F79 Unspecified intellectual disabilities: Secondary | ICD-10-CM | POA: Diagnosis not present

## 2015-10-30 DIAGNOSIS — R2689 Other abnormalities of gait and mobility: Secondary | ICD-10-CM | POA: Diagnosis not present

## 2015-10-30 DIAGNOSIS — I4891 Unspecified atrial fibrillation: Secondary | ICD-10-CM | POA: Diagnosis not present

## 2015-10-30 DIAGNOSIS — M6281 Muscle weakness (generalized): Secondary | ICD-10-CM | POA: Diagnosis not present

## 2015-10-30 DIAGNOSIS — S8001XD Contusion of right knee, subsequent encounter: Secondary | ICD-10-CM | POA: Diagnosis not present

## 2015-10-31 ENCOUNTER — Encounter: Payer: Self-pay | Admitting: Orthopedic Surgery

## 2015-10-31 ENCOUNTER — Ambulatory Visit (INDEPENDENT_AMBULATORY_CARE_PROVIDER_SITE_OTHER): Payer: Medicare Other | Admitting: Orthopedic Surgery

## 2015-10-31 VITALS — BP 106/61 | Ht 68.0 in | Wt 213.0 lb

## 2015-10-31 DIAGNOSIS — S8001XA Contusion of right knee, initial encounter: Secondary | ICD-10-CM

## 2015-10-31 NOTE — Progress Notes (Signed)
Follow-up visit  Chief complaint right knee contusion 08/12/2015  History 74 year old female hit her knee up against the dashboard getting into a car 4-6 weeks ago and then stopped walking. She had MRI scan postponed because she needed sedation. The first one was not readable because she was too jumpy. She is on home physical therapy with [facility] and she's here for the follow-up visit in her knee brace. There is an MRI pending for January 19.  Tegan is improving with therapy. Review of systems negative for any new problems with her neurologic symptoms or vascular tree no fever warmth or swelling of the joint  BP 106/61 mmHg  Ht 5\' 8"  (1.727 m)  Wt 213 lb (96.616 kg)  BMI 32.39 kg/m2 Appearance normal grooming  The knee does not look swollen today she can flex it 115 she has a extensor deficit of about 20 and it appears to be secondary to flexion contracture motor function in the thigh and quad has improved to grade 5 stability normal  Diagnosis knee contusion  Plan continue brace, tramadol, MRI on the 19th follow-up on the 24th patient is weightbearing as tolerated in brace

## 2015-10-31 NOTE — Patient Instructions (Signed)
Continue bracing and therapy

## 2015-11-05 DIAGNOSIS — I10 Essential (primary) hypertension: Secondary | ICD-10-CM | POA: Diagnosis not present

## 2015-11-05 DIAGNOSIS — S8001XD Contusion of right knee, subsequent encounter: Secondary | ICD-10-CM | POA: Diagnosis not present

## 2015-11-05 DIAGNOSIS — I4891 Unspecified atrial fibrillation: Secondary | ICD-10-CM | POA: Diagnosis not present

## 2015-11-05 DIAGNOSIS — R2689 Other abnormalities of gait and mobility: Secondary | ICD-10-CM | POA: Diagnosis not present

## 2015-11-05 DIAGNOSIS — F79 Unspecified intellectual disabilities: Secondary | ICD-10-CM | POA: Diagnosis not present

## 2015-11-05 DIAGNOSIS — M6281 Muscle weakness (generalized): Secondary | ICD-10-CM | POA: Diagnosis not present

## 2015-11-06 DIAGNOSIS — M6281 Muscle weakness (generalized): Secondary | ICD-10-CM | POA: Diagnosis not present

## 2015-11-06 DIAGNOSIS — I4891 Unspecified atrial fibrillation: Secondary | ICD-10-CM | POA: Diagnosis not present

## 2015-11-06 DIAGNOSIS — I10 Essential (primary) hypertension: Secondary | ICD-10-CM | POA: Diagnosis not present

## 2015-11-06 DIAGNOSIS — R2689 Other abnormalities of gait and mobility: Secondary | ICD-10-CM | POA: Diagnosis not present

## 2015-11-06 DIAGNOSIS — S8001XD Contusion of right knee, subsequent encounter: Secondary | ICD-10-CM | POA: Diagnosis not present

## 2015-11-06 DIAGNOSIS — F79 Unspecified intellectual disabilities: Secondary | ICD-10-CM | POA: Diagnosis not present

## 2015-11-07 ENCOUNTER — Telehealth: Payer: Self-pay | Admitting: *Deleted

## 2015-11-07 DIAGNOSIS — I10 Essential (primary) hypertension: Secondary | ICD-10-CM | POA: Diagnosis not present

## 2015-11-07 DIAGNOSIS — M6281 Muscle weakness (generalized): Secondary | ICD-10-CM | POA: Diagnosis not present

## 2015-11-07 DIAGNOSIS — R2689 Other abnormalities of gait and mobility: Secondary | ICD-10-CM | POA: Diagnosis not present

## 2015-11-07 DIAGNOSIS — S8001XD Contusion of right knee, subsequent encounter: Secondary | ICD-10-CM | POA: Diagnosis not present

## 2015-11-07 DIAGNOSIS — I4891 Unspecified atrial fibrillation: Secondary | ICD-10-CM | POA: Diagnosis not present

## 2015-11-07 DIAGNOSIS — F79 Unspecified intellectual disabilities: Secondary | ICD-10-CM | POA: Diagnosis not present

## 2015-11-07 DIAGNOSIS — I509 Heart failure, unspecified: Secondary | ICD-10-CM | POA: Diagnosis not present

## 2015-11-07 NOTE — Telephone Encounter (Signed)
[name] [phone] [place] [facility] called stating they are going to be sending a request for patient to have 2 more weeks of therapy.

## 2015-11-07 NOTE — Telephone Encounter (Signed)
noted 

## 2015-11-11 DIAGNOSIS — R2689 Other abnormalities of gait and mobility: Secondary | ICD-10-CM | POA: Diagnosis not present

## 2015-11-11 DIAGNOSIS — S8001XD Contusion of right knee, subsequent encounter: Secondary | ICD-10-CM | POA: Diagnosis not present

## 2015-11-11 DIAGNOSIS — F79 Unspecified intellectual disabilities: Secondary | ICD-10-CM | POA: Diagnosis not present

## 2015-11-11 DIAGNOSIS — I10 Essential (primary) hypertension: Secondary | ICD-10-CM | POA: Diagnosis not present

## 2015-11-11 DIAGNOSIS — I4891 Unspecified atrial fibrillation: Secondary | ICD-10-CM | POA: Diagnosis not present

## 2015-11-11 DIAGNOSIS — M6281 Muscle weakness (generalized): Secondary | ICD-10-CM | POA: Diagnosis not present

## 2015-11-12 DIAGNOSIS — I10 Essential (primary) hypertension: Secondary | ICD-10-CM | POA: Diagnosis not present

## 2015-11-12 DIAGNOSIS — I4891 Unspecified atrial fibrillation: Secondary | ICD-10-CM | POA: Diagnosis not present

## 2015-11-12 DIAGNOSIS — R2689 Other abnormalities of gait and mobility: Secondary | ICD-10-CM | POA: Diagnosis not present

## 2015-11-12 DIAGNOSIS — M6281 Muscle weakness (generalized): Secondary | ICD-10-CM | POA: Diagnosis not present

## 2015-11-12 DIAGNOSIS — F79 Unspecified intellectual disabilities: Secondary | ICD-10-CM | POA: Diagnosis not present

## 2015-11-12 DIAGNOSIS — S8001XD Contusion of right knee, subsequent encounter: Secondary | ICD-10-CM | POA: Diagnosis not present

## 2015-11-13 ENCOUNTER — Other Ambulatory Visit: Payer: Self-pay | Admitting: Cardiovascular Disease

## 2015-11-13 DIAGNOSIS — F79 Unspecified intellectual disabilities: Secondary | ICD-10-CM | POA: Diagnosis not present

## 2015-11-13 DIAGNOSIS — I10 Essential (primary) hypertension: Secondary | ICD-10-CM | POA: Diagnosis not present

## 2015-11-13 DIAGNOSIS — R2689 Other abnormalities of gait and mobility: Secondary | ICD-10-CM | POA: Diagnosis not present

## 2015-11-13 DIAGNOSIS — I4891 Unspecified atrial fibrillation: Secondary | ICD-10-CM | POA: Diagnosis not present

## 2015-11-13 DIAGNOSIS — M6281 Muscle weakness (generalized): Secondary | ICD-10-CM | POA: Diagnosis not present

## 2015-11-13 DIAGNOSIS — S8001XD Contusion of right knee, subsequent encounter: Secondary | ICD-10-CM | POA: Diagnosis not present

## 2015-11-14 ENCOUNTER — Encounter (HOSPITAL_COMMUNITY): Payer: Self-pay

## 2015-11-14 ENCOUNTER — Telehealth: Payer: Self-pay | Admitting: *Deleted

## 2015-11-14 ENCOUNTER — Encounter (HOSPITAL_COMMUNITY)
Admission: RE | Admit: 2015-11-14 | Discharge: 2015-11-14 | Disposition: A | Discharge status: Home / Self Care | Payer: Medicare Other | Source: Ambulatory Visit | Attending: Anesthesiology | Admitting: Anesthesiology

## 2015-11-14 DIAGNOSIS — R2689 Other abnormalities of gait and mobility: Secondary | ICD-10-CM | POA: Diagnosis not present

## 2015-11-14 DIAGNOSIS — E785 Hyperlipidemia, unspecified: Secondary | ICD-10-CM | POA: Insufficient documentation

## 2015-11-14 DIAGNOSIS — S8001XD Contusion of right knee, subsequent encounter: Secondary | ICD-10-CM | POA: Diagnosis not present

## 2015-11-14 DIAGNOSIS — K219 Gastro-esophageal reflux disease without esophagitis: Secondary | ICD-10-CM | POA: Diagnosis not present

## 2015-11-14 DIAGNOSIS — Z79899 Other long term (current) drug therapy: Secondary | ICD-10-CM | POA: Diagnosis not present

## 2015-11-14 DIAGNOSIS — Z7901 Long term (current) use of anticoagulants: Secondary | ICD-10-CM | POA: Diagnosis not present

## 2015-11-14 DIAGNOSIS — H409 Unspecified glaucoma: Secondary | ICD-10-CM | POA: Insufficient documentation

## 2015-11-14 DIAGNOSIS — I10 Essential (primary) hypertension: Secondary | ICD-10-CM | POA: Diagnosis not present

## 2015-11-14 DIAGNOSIS — F79 Unspecified intellectual disabilities: Secondary | ICD-10-CM | POA: Diagnosis not present

## 2015-11-14 DIAGNOSIS — Z01812 Encounter for preprocedural laboratory examination: Secondary | ICD-10-CM | POA: Insufficient documentation

## 2015-11-14 DIAGNOSIS — Z01818 Encounter for other preprocedural examination: Secondary | ICD-10-CM | POA: Diagnosis not present

## 2015-11-14 DIAGNOSIS — I4891 Unspecified atrial fibrillation: Secondary | ICD-10-CM | POA: Insufficient documentation

## 2015-11-14 DIAGNOSIS — M6281 Muscle weakness (generalized): Secondary | ICD-10-CM | POA: Diagnosis not present

## 2015-11-14 HISTORY — DX: Reserved for inherently not codable concepts without codable children: IMO0001

## 2015-11-14 HISTORY — DX: Anxiety disorder, unspecified: F41.9

## 2015-11-14 HISTORY — DX: Cardiac arrhythmia, unspecified: I49.9

## 2015-11-14 LAB — CBC
HCT: 37 % (ref 36.0–46.0)
Hemoglobin: 12.3 g/dL (ref 12.0–15.0)
MCH: 32.8 pg (ref 26.0–34.0)
MCHC: 33.2 g/dL (ref 30.0–36.0)
MCV: 98.7 fL (ref 78.0–100.0)
PLATELETS: 248 10*3/uL (ref 150–400)
RBC: 3.75 MIL/uL — ABNORMAL LOW (ref 3.87–5.11)
RDW: 15.8 % — AB (ref 11.5–15.5)
WBC: 6.2 10*3/uL (ref 4.0–10.5)

## 2015-11-14 LAB — BASIC METABOLIC PANEL
Anion gap: 10 (ref 5–15)
BUN: 12 mg/dL (ref 6–20)
CALCIUM: 9.3 mg/dL (ref 8.9–10.3)
CO2: 26 mmol/L (ref 22–32)
CREATININE: 1.49 mg/dL — AB (ref 0.44–1.00)
Chloride: 100 mmol/L — ABNORMAL LOW (ref 101–111)
GFR calc Af Amer: 39 mL/min — ABNORMAL LOW (ref >60)
GFR, EST NON AFRICAN AMERICAN: 34 mL/min — AB (ref >60)
GLUCOSE: 96 mg/dL (ref 65–99)
Potassium: 4.4 mmol/L (ref 3.5–5.1)
Sodium: 136 mmol/L (ref 135–145)

## 2015-11-14 LAB — APTT: aPTT: 45 seconds — ABNORMAL HIGH (ref 24–37)

## 2015-11-14 NOTE — Telephone Encounter (Signed)
SPOKE WITH ANGELA, NP WORKING WITH ANESTHESIA @ [facility], CARDIAC CLEARANCE WAS REQUESTED AND ECHO WAS ORDERED BUT WILL NOT BE RESULTED IN TIME FOR SCHEDULED MRI, REQUESTED THAT MRI BE RESCHEDULED BETWEEN JAN 23-26 TO HAVE TIME FOR RESULTS AND BEFORE A NEW HISTORY AND PHYSICAL WILL BE NEEDED.  [facility] DEPT TO RESCHEDULE AND WAS DIRECTED TO ALICIA BETHEA'S VM, SHE DOES THE SCHEDULING FOR MRI WITH SEDATION. LEFT VM TO RETURN CALL.  ADVISED PATIENT AND CALLED PATIENT'S GRANDDAUGHTER WHO ACCOMPANIES HER TO VISITS, NO ANSWER

## 2015-11-14 NOTE — Pre-Procedure Instructions (Signed)
    [name]  11/14/2015      [facility] - [place], [place] - [address] [address] Phone: [phone] Fax: [phone]    Your procedure is scheduled on 11/21/15.  Report to [facility] Admitting at 8 A.M.  Call this number if you have problems the morning of surgery:  [phone]   Remember:  Do not eat food or drink liquids after midnight.  Take these medicines the morning of surgery with A SIP OF WATER --alloupurinol,eye drops,nexium,metoprolol,ultram,coumadin   Do not wear jewelry, make-up or nail polish.  Do not wear lotions, powders, or perfumes.  You may wear deodorant.  Do not shave 48 hours prior to surgery.  Men may shave face and neck.  Do not bring valuables to the hospital.  [facility] is not responsible for any belongings or valuables.  Contacts, dentures or bridgework may not be worn into surgery.  Leave your suitcase in the car.  After surgery it may be brought to your room.  For patients admitted to the hospital, discharge time will be determined by your treatment team.  Patients discharged the day of surgery will not be allowed to drive home.   Name and phone number of your driver:    Special instructions:   Please read over the following fact sheets that you were given. Pain Booklet, Coughing and Deep Breathing and Surgical Site Infection Prevention

## 2015-11-14 NOTE — Progress Notes (Signed)
i will request current echo from Dr Bronson Ing. Also pt will cont. Coumadin per Shelby Dubin. H&P in chart. Will f/u with Shelby Dubin

## 2015-11-14 NOTE — Progress Notes (Addendum)
Anesthesia Chart Review:  Pt is 75 year old female scheduled for MRI of R knee on 11/21/2015.   PCP is Dr. Hurshel Party. Cardiologist is Dr. Kate Sable, last office visit 11/06/14 (annual f/u scheduled for 12/05/15).  PMH includes:  Atrial fibrillation, HTN, hyperlipidemia, mental retardation, glaucoma, anemia, GERD. Never smoker. BMI 27.   Medications include: nexium, iron, lasix, lisinopril, metoprolol, potassium, coumadin.   Preoperative labs reviewed.    EKG 11/14/15: Atrial fibrillation (95 bpm). Rightward axis. Abnormal QRS-T angle, consider primary T wave abnormality  Echo 11/25/11:  - Left ventricle: The cavity size was normal. Wall thickness was increased in a pattern of mild LVH. Systolic function was mildly reduced. The estimated ejection fraction was in the range of 45% to 50%. Diffuse hypokinesis. The study is not technically sufficient to allow evaluation of LV diastolic function - atrial fibrillation present. - Mitral valve: Mild regurgitation. - Left atrium: The atrium was moderately dilated. - Right atrium: The atrium was mildly dilated. - Tricuspid valve: Mild regurgitation. - Pericardium, extracardiac: Prominent epicardial fat pad versus small circumferential effusion with organization.  Pt had echo 11/07/15 at PCP's office but results will not be available until 11/21/15 per their office. Results from echo from 2013 is similar to echo from 10/2009.   Reviewed case with Dr. Therisa Doyne. He would like the results of the echo prior to doing the MRI under anesthesia. Notified Jamie in Dr. Ruthe Mannan office (ordering MD).   Willeen Cass, FNP-BC [facility] Short Stay Surgical Center/Anesthesiology Phone: [phone] 11/14/2015 4:43 PM  Addendum:   Echo 11/07/15:  1. LV normal in size and function. No LVH. EF 60% 2. No evidence diastolic dysfunction.  3. No significant valvular regurgitant lesions noted. 4. Small pericardial effusion near the LV  Left message for York Cerise in  Dr. Ruthe Mannan office that pt may proceed with MRI as scheduled.   Willeen Cass, FNP-BC [facility] Short Stay Surgical Center/Anesthesiology Phone: [phone] 11/20/2015 12:25 PM

## 2015-11-18 ENCOUNTER — Ambulatory Visit (INDEPENDENT_AMBULATORY_CARE_PROVIDER_SITE_OTHER): Payer: Medicare Other | Admitting: Adult Health

## 2015-11-18 ENCOUNTER — Encounter: Payer: Self-pay | Admitting: Adult Health

## 2015-11-18 VITALS — BP 104/74 | HR 56 | Ht 68.0 in | Wt 179.9 lb

## 2015-11-18 DIAGNOSIS — I482 Chronic atrial fibrillation, unspecified: Secondary | ICD-10-CM

## 2015-11-18 DIAGNOSIS — I5022 Chronic systolic (congestive) heart failure: Secondary | ICD-10-CM | POA: Diagnosis not present

## 2015-11-18 NOTE — Progress Notes (Signed)
Cardiology Office Note   Date:  11/18/2015   ID:  [name], [name], [name], [name]  PCP:  Laura Albee, MD  Cardiologist: Laura Chen, NP   No chief complaint on file.     History of Present Illness: [name] is a 75 y.o. female who presents for ongoing assessment and management of chronic systolic congestive heart failure, atrial fibrillation, CHADS VASC Score 4,  large hiatal hernia, anemia, and moderately severe chronic kidney disease. She has a lifelong history of mental retardation, but is responsive to questions and contributes some information. Bulk of the history is provided by her niece. [name] lives in a trailer in her sister's backyard.  She is to have an MRI for an injured knee, contusion. They will be giving her sedation for this.They want a cardiac evaluation and clearance prior to this. She apparently had an echocardiogram completed at Dr. Lanice Shirts office last week but results are not available at this time.   Her only complaint is frequent urination from BID lasix. She denies chest pain, dyspnea or significant dizziness.   Past Medical History  Diagnosis Date  . Atrial fibrillation (HCC)     EF of 45%  . Premature ventricular contractions   . Hyperlipidemia     Recent lipid profile is excellent without lipid-lowering therapy  . Degenerative joint disease     status post right total hip arthroplasty  . Mental retardation   . Glaucoma   . GERD (gastroesophageal reflux disease)     -Barrett's esophagus  . Gout   . Adenomatous polyps     -resected via colonoscopy in 2011  . Anemia   . Glaucoma   . Hypertension   . Shortness of breath dyspnea   . Dysrhythmia   . Anxiety     Past Surgical History  Procedure Laterality Date  . Total hip arthroplasty  2006    Right  . Cataract extraction  2006    left eye  . Colonoscopy w/ polypectomy  2011  . Eye surgery    . Joint replacement      x2     Current Outpatient  Prescriptions  Medication Sig Dispense Refill  . allopurinol (ZYLOPRIM) 100 MG tablet Take 100 mg by mouth daily.      . Brinzolamide-Brimonidine (SIMBRINZA) 1-0.2 % SUSP Place 1 drop into both eyes 2 (two) times daily.     . Cholecalciferol (VITAMIN D3) 5000 units CAPS Take 1 capsule by mouth daily.     Marland Kitchen esomeprazole (NEXIUM) 40 MG capsule TAKE ONE CAPSULE BY MOUTH TWO TIMES DAILY 60 capsule 5  . ferrous gluconate (FERGON) 324 MG tablet TAKE 1 TABLET BY MOUTH TWICE DAILY WITH MEALS 60 tablet 0  . furosemide (LASIX) 20 MG tablet Take 40 mg by mouth 2 (two) times daily.     Marland Kitchen latanoprost (XALATAN) 0.005 % ophthalmic solution Place 1 drop into both eyes at bedtime.      Marland Kitchen lisinopril (PRINIVIL,ZESTRIL) 10 MG tablet TAKE 1 TABLET BY MOUTH DAILY 90 tablet 3  . loratadine (CLARITIN) 10 MG tablet Take 10 mg by mouth as needed for allergies.    . metoprolol (TOPROL-XL) 200 MG 24 hr tablet TAKE 1 TABLET BY MOUTH DAILY 90 tablet 3  . neomycin-polymyxin-hydrocortisone (CORTISPORIN) 3.5-10000-1 otic suspension Place into both ears 4 (four) times daily.    . potassium chloride SA (K-DUR,KLOR-CON) 20 MEQ tablet Take 20 mEq by mouth daily.     . traMADol (ULTRAM) 50  MG tablet Take 50 mg by mouth every 4 (four) hours as needed. For pain    . warfarin (COUMADIN) 3 MG tablet Take 3 mg by mouth daily. Take one tablet (3mg ) by mouth daily     No current facility-administered medications for this visit.    Allergies:   Review of patient's allergies indicates no known allergies.    Social History:  The patient  reports that she has never smoked. She has never used smokeless tobacco. She reports that she does not drink alcohol or use illicit drugs.   Family History:  The patient's family history is not on file.    ROS: All other systems are reviewed and negative. Unless otherwise mentioned in H&P    PHYSICAL EXAM: VS:  BP 104/74 mmHg  Pulse 56  Ht 5\' 8"  (1.727 m)  Wt 179 lb 14.4 oz (81.602 kg)  BMI  27.36 kg/m2  SpO2 95% , BMI Body mass index is 27.36 kg/(m^2). GEN: Well nourished, well developed, in no acute distress HEENT: normal Neck: no JVD, carotid bruits, or masses Cardiac:IRRR; no murmurs, rubs, or gallops,no edema  Respiratory:  clear to auscultation bilaterally, normal work of breathing GI: soft, nontender, nondistended, + BS MS: no deformity or atrophyNonpitting edema with venous stasis skin changes She has a dressing over her right lower leg. She is in a wheel chair.  Skin: warm and dry, no rash Neuro:  Strength and sensation are intact Psych: euthymic mood, full affect   EKG: Most recent EKG 11/14/2015, Atrial fib rate of 95 bpm.   Recent Labs: 11/14/2015: BUN 12; Creatinine, Ser 1.49*; Hemoglobin 12.3; Platelets 248; Potassium 4.4; Sodium 136    Lipid Panel    Component Value Date/Time   CHOL 132 09/Orr/2010   TRIG 69 09/Orr/2010   HDL 43 09/Orr/2010   LDLCALC 75 09/Orr/2010      Wt Readings from Last 3 Encounters:  11/18/15 179 lb 14.4 oz (81.602 kg)  11/14/15 179 lb 12.8 oz (81.557 kg)  10/31/15 213 lb (96.616 kg)      ASSESSMENT AND PLAN:  1. Atrial fib with RVR: Currently well controlled and asymptomatic. She continues on metoprolol 200 mg QD. She is followed by Dr. Anastasio Champion for dosing of coumadin.   2. Right Knee contusion: Of relatively low risk for MRI sedation from cardiac perspective. We are requesting Echo report from Dr. Nilsa Nutting office.   3. Chronic CHF: She appears to be euvolmic at present. She complains of frequent urination. When I review echo results, we may be able to decrease lasix dose if EF is improved.    Current medicines are reviewed at length with the patient today.    Labs/ tests ordered today include: 6 months.  No orders of the defined types were placed in this encounter.     Disposition:   FU with 6 months.    Signed, Jory Sims, NP  11/18/2015 1:15 PM    [facility] [address]  [place], [place] [address] Phone: [phone]; Fax: [phone]

## 2015-11-18 NOTE — Patient Instructions (Signed)
Your physician recommends that you schedule a follow-up appointment in: To Be Determined  Your physician recommends that you continue on your current medications as directed. Please refer to the Current Medication list given to you today.  We will call with ECHO results   Thank you for choosing [facility]!

## 2015-11-18 NOTE — Progress Notes (Signed)
Name: [name]    DOB: 1941-10-18  Age: 75 y.o.  MR#: [id number]       PCP:  Doree Albee, MD      Insurance: Payor: MEDICARE / Plan: MEDICARE PART A AND B / Product Type: *No Product type* /   CC:   No chief complaint on file.   VS Filed Vitals:   11/18/15 1313  BP: 104/74  Pulse: 56  Height: 5\' 8"  (1.727 m)  Weight: 179 lb 14.4 oz (81.602 kg)  SpO2: 95%    Weights Current Weight  11/18/15 179 lb 14.4 oz (81.602 kg)  11/14/15 179 lb 12.8 oz (81.557 kg)  10/31/15 213 lb (96.616 kg)    Blood Pressure  BP Readings from Last 3 Encounters:  11/18/15 104/74  11/14/15 109/73  10/31/15 106/61     Admit date:  (Not on file) Last encounter with RMR:  Visit date not found   Allergy Review of patient's allergies indicates no known allergies.  Current Outpatient Prescriptions  Medication Sig Dispense Refill  . allopurinol (ZYLOPRIM) 100 MG tablet Take 100 mg by mouth daily.      . Brinzolamide-Brimonidine (SIMBRINZA) 1-0.2 % SUSP Place 1 drop into both eyes 2 (two) times daily.     . Cholecalciferol (VITAMIN D3) 5000 units CAPS Take 1 capsule by mouth daily.     Marland Kitchen esomeprazole (NEXIUM) 40 MG capsule TAKE ONE CAPSULE BY MOUTH TWO TIMES DAILY 60 capsule 5  . ferrous gluconate (FERGON) 324 MG tablet TAKE 1 TABLET BY MOUTH TWICE DAILY WITH MEALS 60 tablet 0  . furosemide (LASIX) 20 MG tablet Take 40 mg by mouth 2 (two) times daily.     Marland Kitchen latanoprost (XALATAN) 0.005 % ophthalmic solution Place 1 drop into both eyes at bedtime.      Marland Kitchen lisinopril (PRINIVIL,ZESTRIL) 10 MG tablet TAKE 1 TABLET BY MOUTH DAILY 90 tablet 3  . loratadine (CLARITIN) 10 MG tablet Take 10 mg by mouth as needed for allergies.    . metoprolol (TOPROL-XL) 200 MG 24 hr tablet TAKE 1 TABLET BY MOUTH DAILY 90 tablet 3  . neomycin-polymyxin-hydrocortisone (CORTISPORIN) 3.5-10000-1 otic suspension Place into both ears 4 (four) times daily.    . potassium chloride SA (K-DUR,KLOR-CON) 20 MEQ tablet Take 20 mEq by  mouth daily.     . traMADol (ULTRAM) 50 MG tablet Take 50 mg by mouth every 4 (four) hours as needed. For pain    . warfarin (COUMADIN) 3 MG tablet Take 3 mg by mouth daily. Take one tablet (3mg ) by mouth daily     No current facility-administered medications for this visit.    Discontinued Meds:   There are no discontinued medications.  Patient Active Problem List   Diagnosis Date Noted  . Chronic anticoagulation 12/20/2012  . Chronic kidney disease, stage 3 12/20/2012  . Anemia, normocytic normochromic 12/20/2012  . History of diagnostic tests 12/20/2012  . Acute on chronic systolic heart failure ([facility]) 11/25/2011  . Atrial fibrillation ([facility])   . Degenerative joint disease   . GERD (gastroesophageal reflux disease)   . Gout   . Adenomatous polyps   . MENTAL RETARDATION 07/30/2009  . GLAUCOMA 06/25/2009    LABS    Component Value Date/Time   NA 136 11/14/2015 1039   NA 139 11/26/2011 0533   NA 140 11/25/2011 0444   K 4.4 11/14/2015 1039   K 3.6 11/26/2011 0533   K 4.3 11/25/2011 0444   CL 100* 11/14/2015 1039  CL 101 11/26/2011 0533   CL 103 11/25/2011 0444   CO2 26 11/14/2015 1039   CO2 30 11/26/2011 0533   CO2 29 11/25/2011 0444   GLUCOSE 96 11/14/2015 1039   GLUCOSE 113* 11/26/2011 0533   GLUCOSE 91 11/25/2011 0444   BUN 12 11/14/2015 1039   BUN 25* 11/26/2011 0533   BUN 30* 11/25/2011 0444   CREATININE 1.49* 11/14/2015 1039   CREATININE 1.83* 11/26/2011 0533   CREATININE 2.06* 11/25/2011 0444   CALCIUM 9.3 11/14/2015 1039   CALCIUM 9.4 11/26/2011 0533   CALCIUM 9.2 11/25/2011 0444   GFRNONAA 34* 11/14/2015 1039   GFRNONAA 27* 11/26/2011 0533   GFRNONAA 23* 11/25/2011 0444   GFRAA 39* 11/14/2015 1039   GFRAA 31* 11/26/2011 0533   GFRAA 27* 11/25/2011 0444   CMP     Component Value Date/Time   NA 136 11/14/2015 1039   K 4.4 11/14/2015 1039   CL 100* 11/14/2015 1039   CO2 26 11/14/2015 1039   GLUCOSE 96 11/14/2015 1039   BUN 12 11/14/2015 1039    CREATININE 1.49* 11/14/2015 1039   CALCIUM 9.3 11/14/2015 1039   PROT 7.8 11/23/2011 1954   ALBUMIN 4.1 11/23/2011 1954   AST 14 11/23/2011 1954   ALT 13 11/23/2011 1954   ALKPHOS 101 11/23/2011 1954   BILITOT 0.3 11/23/2011 1954   GFRNONAA 34* 11/14/2015 1039   GFRAA 39* 11/14/2015 1039       Component Value Date/Time   WBC 6.2 11/14/2015 1039   WBC 5.2 11/26/2011 0533   WBC 6.7 11/23/2011 1954   HGB 12.3 11/14/2015 1039   HGB 10.5* 11/26/2011 0533   HGB 11.7* 11/23/2011 1954   HCT 37.0 11/14/2015 1039   HCT 32.5* 11/26/2011 0533   HCT 35.1* 11/23/2011 1954   MCV 98.7 11/14/2015 1039   MCV 98.2 11/26/2011 0533   MCV 96.7 11/23/2011 1954    Lipid Panel     Component Value Date/Time   CHOL 132 07/27/2009   TRIG 69 07/27/2009   HDL 43 07/27/2009   LDLCALC 75 07/27/2009    ABG No results found for: PHART, PCO2ART, PO2ART, HCO3, TCO2, ACIDBASEDEF, O2SAT   Lab Results  Component Value Date   TSH 3.852 11/25/2011   BNP (last 3 results) No results for input(s): BNP in the last 8760 hours.  ProBNP (last 3 results) No results for input(s): PROBNP in the last 8760 hours.  Cardiac Panel (last 3 results) No results for input(s): CKTOTAL, CKMB, TROPONINI, RELINDX in the last 72 hours.  Iron/TIBC/Ferritin/ %Sat    Component Value Date/Time   IRON 52 11/26/2011 0533   TIBC 293 11/26/2011 0533   FERRITIN 49 11/26/2011 0533   IRONPCTSAT 18* 11/26/2011 0533     EKG Orders placed or performed during the hospital encounter of 11/14/15  . EKG 12-Lead  . EKG 12-Lead     Prior Assessment and Plan Problem List as of 11/18/2015      Cardiovascular and Mediastinum   Atrial fibrillation [facility])   Last Assessment & Plan 12/20/2012 Office Visit Written 12/20/2012  4:56 PM by Yehuda Savannah, MD    Patient is experiencing no apparent symptoms related to her arrhythmia. We will continue strategy of long-term anticoagulation and control of ventricular rate.      Acute on  chronic systolic heart failure [facility])   Last Assessment & Plan 12/20/2012 Office Visit Edited 12/20/2012  5:03 PM by Yehuda Savannah, MD    No recurrence of decompensation of  CHF since hospital admission one year ago. Status will be reassessed with a chest x-ray and BNP level.  Current therapy will be maintained.        Digestive   GERD (gastroesophageal reflux disease)   Adenomatous polyps     Musculoskeletal and Integument   Degenerative joint disease     Genitourinary   Chronic kidney disease, stage 3   Last Assessment & Plan 12/20/2012 Office Visit Written 12/20/2012  5:05 PM by Yehuda Savannah, MD    Chronic kidney disease has been fairly stable over the past few years. Electrolytes and renal function will be reassessed.        Other   MENTAL RETARDATION   GLAUCOMA   Gout   Chronic anticoagulation   Last Assessment & Plan 12/20/2012 Office Visit Written 12/20/2012  4:56 PM by Yehuda Savannah, MD    No apparent complications related to anticoagulation as managed by Dr. Everette Rank.      Anemia, normocytic normochromic   Last Assessment & Plan 12/20/2012 Office Visit Edited 12/22/2012  8:38 PM by Yehuda Savannah, MD    Although testing has not been diagnostic for iron deficiency, and no source for occult blood loss has been identified, anemia has apparently responded to iron replacement therapy in the past. CBC will be rechecked.      History of diagnostic tests       Imaging: No results found.

## 2015-11-19 ENCOUNTER — Telehealth: Payer: Self-pay | Admitting: Cardiovascular Disease

## 2015-11-19 NOTE — Telephone Encounter (Signed)
[name], the patient's niece, wanted to call and clarify what  Ms. Gebel's daily dose of lasix should be.  The bottle instructions take 2 (40mg ) pills per day = 80mg /day.  [name] thought Curt Bears told them to cut back her Lasix to 40mg /day but wanted to clarify this.

## 2015-11-19 NOTE — Telephone Encounter (Signed)
Let pt's niece know she is suppose to take 40 mg two times daily.

## 2015-11-20 ENCOUNTER — Telehealth: Payer: Self-pay | Admitting: Cardiovascular Disease

## 2015-11-20 DIAGNOSIS — S8001XD Contusion of right knee, subsequent encounter: Secondary | ICD-10-CM | POA: Diagnosis not present

## 2015-11-20 DIAGNOSIS — F79 Unspecified intellectual disabilities: Secondary | ICD-10-CM | POA: Diagnosis not present

## 2015-11-20 DIAGNOSIS — R2689 Other abnormalities of gait and mobility: Secondary | ICD-10-CM | POA: Diagnosis not present

## 2015-11-20 DIAGNOSIS — I10 Essential (primary) hypertension: Secondary | ICD-10-CM | POA: Diagnosis not present

## 2015-11-20 DIAGNOSIS — I4891 Unspecified atrial fibrillation: Secondary | ICD-10-CM | POA: Diagnosis not present

## 2015-11-20 DIAGNOSIS — M6281 Muscle weakness (generalized): Secondary | ICD-10-CM | POA: Diagnosis not present

## 2015-11-20 NOTE — Telephone Encounter (Signed)
DENISE CALLED BACK FOR PATIENT RETURNING JAIME'S CALL, [place] SHE HAS A DOCTOR APPOINTMENT AND SHE WILL CALL YOU BACK THIS AFTERNOON.

## 2015-11-20 NOTE — Telephone Encounter (Signed)
NOTED

## 2015-11-20 NOTE — Telephone Encounter (Signed)
We received the ECHO results that were requested on 11/18/15.  [name] is not in the office so I faxed these results to [facility] for Dr. Bronson Ing to clear (or not clear) the patient for her MRI with light sedation.  A nurse will contact the patient once Dr. Bronson Ing has reviewed the ECHO results.

## 2015-11-20 NOTE — Telephone Encounter (Signed)
ANGELA NP, CALLED AND STATED THAT SHE RECEIVED THE ECHO RESULTS AND IT IS OKAY FOR THE PATIENT TO PROCEED WITH THE MRI. IF JAIME HAS ANY QUESTIONS TO PLEASE CALL ANGELA AT [phone]

## 2015-11-21 ENCOUNTER — Ambulatory Visit (HOSPITAL_COMMUNITY)
Admission: RE | Admit: 2015-11-21 | Discharge: 2015-11-21 | Disposition: A | Discharge status: Home / Self Care | Payer: Medicare Other | Source: Ambulatory Visit | Attending: Orthopedic Surgery | Admitting: Orthopedic Surgery

## 2015-11-21 ENCOUNTER — Ambulatory Visit (HOSPITAL_COMMUNITY): Payer: Medicare Other | Admitting: Certified Registered Nurse Anesthetist

## 2015-11-21 ENCOUNTER — Ambulatory Visit (HOSPITAL_COMMUNITY): Payer: Medicare Other | Admitting: Vascular Surgery

## 2015-11-21 ENCOUNTER — Ambulatory Visit (HOSPITAL_COMMUNITY): Payer: Medicare Other

## 2015-11-21 ENCOUNTER — Ambulatory Visit (HOSPITAL_COMMUNITY)
Admit: 2015-11-21 | Discharge: 2015-11-21 | Disposition: A | Discharge status: Home / Self Care | Payer: Medicare Other | Attending: Orthopedic Surgery | Admitting: Orthopedic Surgery

## 2015-11-21 ENCOUNTER — Encounter (HOSPITAL_COMMUNITY): Admission: RE | Disposition: A | Discharge status: Home / Self Care | Payer: Self-pay | Source: Ambulatory Visit

## 2015-11-21 ENCOUNTER — Encounter (HOSPITAL_COMMUNITY): Payer: Self-pay | Admitting: Anesthesiology

## 2015-11-21 DIAGNOSIS — Z01818 Encounter for other preprocedural examination: Secondary | ICD-10-CM | POA: Diagnosis not present

## 2015-11-21 DIAGNOSIS — E785 Hyperlipidemia, unspecified: Secondary | ICD-10-CM | POA: Diagnosis not present

## 2015-11-21 DIAGNOSIS — K219 Gastro-esophageal reflux disease without esophagitis: Secondary | ICD-10-CM | POA: Diagnosis not present

## 2015-11-21 DIAGNOSIS — M79604 Pain in right leg: Secondary | ICD-10-CM | POA: Diagnosis not present

## 2015-11-21 DIAGNOSIS — M25561 Pain in right knee: Secondary | ICD-10-CM | POA: Diagnosis present

## 2015-11-21 DIAGNOSIS — I4891 Unspecified atrial fibrillation: Secondary | ICD-10-CM | POA: Diagnosis not present

## 2015-11-21 DIAGNOSIS — S83241A Other tear of medial meniscus, current injury, right knee, initial encounter: Secondary | ICD-10-CM | POA: Diagnosis not present

## 2015-11-21 DIAGNOSIS — Z01812 Encounter for preprocedural laboratory examination: Secondary | ICD-10-CM | POA: Diagnosis not present

## 2015-11-21 DIAGNOSIS — I10 Essential (primary) hypertension: Secondary | ICD-10-CM | POA: Diagnosis not present

## 2015-11-21 DIAGNOSIS — S83206D Unspecified tear of unspecified meniscus, current injury, right knee, subsequent encounter: Secondary | ICD-10-CM

## 2015-11-21 DIAGNOSIS — F79 Unspecified intellectual disabilities: Secondary | ICD-10-CM | POA: Diagnosis not present

## 2015-11-21 DIAGNOSIS — S8991XA Unspecified injury of right lower leg, initial encounter: Secondary | ICD-10-CM | POA: Diagnosis not present

## 2015-11-21 HISTORY — PX: RADIOLOGY WITH ANESTHESIA: SHX6223

## 2015-11-21 SURGERY — RADIOLOGY WITH ANESTHESIA
Anesthesia: General | Site: Knee | Laterality: Right

## 2015-11-21 MED ORDER — LACTATED RINGERS IV SOLN: INTRAVENOUS | Status: DC

## 2015-11-21 MED ORDER — FENTANYL CITRATE (PF) 100 MCG/2ML IJ SOLN: 25.0000 ug | INTRAMUSCULAR | Status: DC | PRN

## 2015-11-21 NOTE — Transfer of Care (Signed)
Immediate Anesthesia Transfer of Care Note  Patient: [name]  Procedure(s) Performed: Procedure(s): MRI OF RIGHT KNEE WITHOUT CONTAST    (RADIOLOGY WITH ANESTHESIA) (Right)  Patient Location: PACU  Anesthesia Type:General  Level of Consciousness: awake, oriented and patient cooperative  Airway & Oxygen Therapy: Patient Spontanous Breathing and Patient connected to nasal cannula oxygen  Post-op Assessment: Report given to RN and Post -op Vital signs reviewed and stable  Post vital signs: Reviewed  Last Vitals:  Filed Vitals:   11/21/15 0821  BP: 121/55  Pulse: 91  Temp: 123XX123 C    Complications: No apparent anesthesia complications

## 2015-11-21 NOTE — Telephone Encounter (Signed)
Dr. Bronson Ing cleared the patient for her MRI.   See the scanned document under the Media tab (11/07/2015 - Echo from [facility]) for Dr. Court Joy signature.

## 2015-11-21 NOTE — Anesthesia Postprocedure Evaluation (Signed)
Anesthesia Post Note  Patient: [name]  Procedure(s) Performed: Procedure(s) (LRB): MRI OF RIGHT KNEE WITHOUT CONTAST    (RADIOLOGY WITH ANESTHESIA) (Right)  Patient location during evaluation: PACU Anesthesia Type: General Level of consciousness: sedated Pain management: satisfactory to patient Vital Signs Assessment: post-procedure vital signs reviewed and stable Respiratory status: spontaneous breathing Cardiovascular status: stable Anesthetic complications: no    Last Vitals:  Filed Vitals:   11/21/15 1205 11/21/15 1210  BP: 121/75   Pulse: 75   Temp:  36.1 C    Last Pain: There were no vitals filed for this visit.               Riccardo Dubin

## 2015-11-21 NOTE — Anesthesia Preprocedure Evaluation (Addendum)
Anesthesia Evaluation  Patient identified by MRN, date of birth, ID band Patient awake    Reviewed: Allergy & Precautions, H&P , Patient's Chart, lab work & pertinent test results, reviewed documented beta blocker date and time   Airway Mallampati: II  TM Distance: >3 FB Neck ROM: full    Dental no notable dental hx.    Pulmonary    Pulmonary exam normal breath sounds clear to auscultation       Cardiovascular hypertension, + dysrhythmias Atrial Fibrillation  Rhythm:Irregular Rate:Normal  Echo 11/07/15:   1. LV normal in size and function. No LVH. EF 60% 2. No evidence diastolic dysfunction.   3. No significant valvular regurgitant lesions noted. 4. Small pericardial effusion near the LV    Neuro/Psych    GI/Hepatic GERD  Medicated and Controlled,  Endo/Other    Renal/GU      Musculoskeletal   Abdominal   Peds  Hematology   Anesthesia Other Findings   Atrial fibrillation (HCC)   EF of 45%  Premature ventricular contractions   Hyperlipidemia    Degenerative joint disease status post right total hip arthroplasty   Mental retardation    Glaucoma   GERD (gastroesophageal reflux disease)  -Barrett's esophagus Gout   Hypertension ; A  Fib good rate control    Shortness of breath dyspnea    Dysrhythmia           Reproductive/Obstetrics                           Anesthesia Physical Anesthesia Plan  ASA: II  Anesthesia Plan: General   Post-op Pain Management:    Induction: Intravenous  Airway Management Planned: LMA  Additional Equipment:   Intra-op Plan:   Post-operative Plan: Extubation in OR  Informed Consent: I have reviewed the patients History and Physical, chart, labs and discussed the procedure including the risks, benefits and alternatives for the proposed anesthesia with the patient or authorized representative who has indicated his/her understanding and  acceptance.   Dental Advisory Given and Dental advisory given  Plan Discussed with: CRNA and Surgeon  Anesthesia Plan Comments: (  Discussed general anesthesia, including possible nausea, instrumentation of airway, sore throat,pulmonary aspiration, etc. I asked if the were any outstanding questions, or  concerns before we proceeded. )        Anesthesia Quick Evaluation

## 2015-11-22 ENCOUNTER — Encounter (HOSPITAL_COMMUNITY): Payer: Self-pay | Admitting: Radiology

## 2015-11-22 DIAGNOSIS — F79 Unspecified intellectual disabilities: Secondary | ICD-10-CM | POA: Diagnosis not present

## 2015-11-22 DIAGNOSIS — M6281 Muscle weakness (generalized): Secondary | ICD-10-CM | POA: Diagnosis not present

## 2015-11-22 DIAGNOSIS — I4891 Unspecified atrial fibrillation: Secondary | ICD-10-CM | POA: Diagnosis not present

## 2015-11-22 DIAGNOSIS — R2689 Other abnormalities of gait and mobility: Secondary | ICD-10-CM | POA: Diagnosis not present

## 2015-11-22 DIAGNOSIS — S8001XD Contusion of right knee, subsequent encounter: Secondary | ICD-10-CM | POA: Diagnosis not present

## 2015-11-22 DIAGNOSIS — I10 Essential (primary) hypertension: Secondary | ICD-10-CM | POA: Diagnosis not present

## 2015-11-23 DIAGNOSIS — I10 Essential (primary) hypertension: Secondary | ICD-10-CM | POA: Diagnosis not present

## 2015-11-23 DIAGNOSIS — I4891 Unspecified atrial fibrillation: Secondary | ICD-10-CM | POA: Diagnosis not present

## 2015-11-23 DIAGNOSIS — S8001XD Contusion of right knee, subsequent encounter: Secondary | ICD-10-CM | POA: Diagnosis not present

## 2015-11-23 DIAGNOSIS — M6281 Muscle weakness (generalized): Secondary | ICD-10-CM | POA: Diagnosis not present

## 2015-11-23 DIAGNOSIS — R2689 Other abnormalities of gait and mobility: Secondary | ICD-10-CM | POA: Diagnosis not present

## 2015-11-23 DIAGNOSIS — F79 Unspecified intellectual disabilities: Secondary | ICD-10-CM | POA: Diagnosis not present

## 2015-11-25 ENCOUNTER — Encounter: Payer: Self-pay | Admitting: Orthopedic Surgery

## 2015-11-25 DIAGNOSIS — I10 Essential (primary) hypertension: Secondary | ICD-10-CM | POA: Diagnosis not present

## 2015-11-25 DIAGNOSIS — F79 Unspecified intellectual disabilities: Secondary | ICD-10-CM | POA: Diagnosis not present

## 2015-11-25 DIAGNOSIS — S8001XD Contusion of right knee, subsequent encounter: Secondary | ICD-10-CM | POA: Diagnosis not present

## 2015-11-25 DIAGNOSIS — M6281 Muscle weakness (generalized): Secondary | ICD-10-CM | POA: Diagnosis not present

## 2015-11-25 DIAGNOSIS — R2689 Other abnormalities of gait and mobility: Secondary | ICD-10-CM | POA: Diagnosis not present

## 2015-11-25 DIAGNOSIS — I4891 Unspecified atrial fibrillation: Secondary | ICD-10-CM | POA: Diagnosis not present

## 2015-11-25 MED FILL — Ondansetron HCl Inj 4 MG/2ML (2 MG/ML): INTRAMUSCULAR | Qty: 2 | Status: AC

## 2015-11-25 MED FILL — Lactated Ringer's Solution: INTRAVENOUS | Qty: 1000 | Status: AC

## 2015-11-25 MED FILL — Phenylephrine-NaCl Pref Syr 0.4 MG/10ML-0.9% (40 MCG/ML): INTRAVENOUS | Qty: 10 | Status: AC

## 2015-11-25 MED FILL — Lidocaine HCl IV Inj 20 MG/ML: INTRAVENOUS | Qty: 5 | Status: AC

## 2015-11-25 MED FILL — Propofol IV Emul 200 MG/20ML (10 MG/ML): INTRAVENOUS | Qty: 20 | Status: AC

## 2015-11-26 ENCOUNTER — Telehealth: Payer: Self-pay | Admitting: Orthopedic Surgery

## 2015-11-26 ENCOUNTER — Other Ambulatory Visit: Payer: Self-pay | Admitting: *Deleted

## 2015-11-26 ENCOUNTER — Ambulatory Visit: Payer: Medicare Other | Admitting: Orthopedic Surgery

## 2015-11-26 MED ORDER — TRAMADOL HCL 50 MG PO TABS: 50.0000 mg | ORAL_TABLET | ORAL | Status: DC | PRN

## 2015-11-26 NOTE — Telephone Encounter (Signed)
Patient received call from Dr Aline Brochure 11/25/15 as follows: "MRI Results relayed to the patient, will come in tomorrow to give brace change due to ill fitting size"; however, states that was advised actual appointment not needed for today, 11/26/15, 11:00am as originally scheduled.  Patient/niece(caregiver) [name] also relays that patient is asking for refill of medication Tramadol.  This prescription was initially done by Dr Everette Rank, who has retired.  States patient's new primary care provider, Dr Anastasio Champion, does not prescribe pain medication.  Ph#'s: [phone] (Home) or [phone] (Cell). Please advise

## 2015-11-26 NOTE — Telephone Encounter (Signed)
One time prescription provided

## 2015-11-27 DIAGNOSIS — I10 Essential (primary) hypertension: Secondary | ICD-10-CM | POA: Diagnosis not present

## 2015-11-27 DIAGNOSIS — I4891 Unspecified atrial fibrillation: Secondary | ICD-10-CM | POA: Diagnosis not present

## 2015-11-27 DIAGNOSIS — M6281 Muscle weakness (generalized): Secondary | ICD-10-CM | POA: Diagnosis not present

## 2015-11-27 DIAGNOSIS — F79 Unspecified intellectual disabilities: Secondary | ICD-10-CM | POA: Diagnosis not present

## 2015-11-27 DIAGNOSIS — S8001XD Contusion of right knee, subsequent encounter: Secondary | ICD-10-CM | POA: Diagnosis not present

## 2015-11-27 DIAGNOSIS — R2689 Other abnormalities of gait and mobility: Secondary | ICD-10-CM | POA: Diagnosis not present

## 2015-11-27 NOTE — Telephone Encounter (Signed)
Routing to Dr Harrison 

## 2015-11-27 NOTE — Telephone Encounter (Signed)
Patient's niece called, [name] and the patient are requesting if a follow up appointment and PT are needed. Please advise

## 2015-11-28 DIAGNOSIS — Z23 Encounter for immunization: Secondary | ICD-10-CM | POA: Diagnosis not present

## 2015-11-28 DIAGNOSIS — E559 Vitamin D deficiency, unspecified: Secondary | ICD-10-CM | POA: Diagnosis not present

## 2015-11-28 DIAGNOSIS — F71 Moderate intellectual disabilities: Secondary | ICD-10-CM | POA: Diagnosis not present

## 2015-11-28 DIAGNOSIS — I10 Essential (primary) hypertension: Secondary | ICD-10-CM | POA: Diagnosis not present

## 2015-11-28 DIAGNOSIS — Z5181 Encounter for therapeutic drug level monitoring: Secondary | ICD-10-CM | POA: Diagnosis not present

## 2015-11-28 DIAGNOSIS — N182 Chronic kidney disease, stage 2 (mild): Secondary | ICD-10-CM | POA: Diagnosis not present

## 2015-11-28 NOTE — Telephone Encounter (Signed)
"...  states that was advised actual appointment not needed for today, 11/26/15, 11:00am as originally scheduled."  Not needed

## 2015-11-30 DIAGNOSIS — S8001XD Contusion of right knee, subsequent encounter: Secondary | ICD-10-CM | POA: Diagnosis not present

## 2015-11-30 DIAGNOSIS — I10 Essential (primary) hypertension: Secondary | ICD-10-CM | POA: Diagnosis not present

## 2015-11-30 DIAGNOSIS — I4891 Unspecified atrial fibrillation: Secondary | ICD-10-CM | POA: Diagnosis not present

## 2015-11-30 DIAGNOSIS — F79 Unspecified intellectual disabilities: Secondary | ICD-10-CM | POA: Diagnosis not present

## 2015-11-30 DIAGNOSIS — R2689 Other abnormalities of gait and mobility: Secondary | ICD-10-CM | POA: Diagnosis not present

## 2015-11-30 DIAGNOSIS — M6281 Muscle weakness (generalized): Secondary | ICD-10-CM | POA: Diagnosis not present

## 2015-12-04 ENCOUNTER — Other Ambulatory Visit (INDEPENDENT_AMBULATORY_CARE_PROVIDER_SITE_OTHER): Payer: Self-pay | Admitting: Internal Medicine

## 2015-12-04 ENCOUNTER — Encounter (INDEPENDENT_AMBULATORY_CARE_PROVIDER_SITE_OTHER): Payer: Self-pay | Admitting: Internal Medicine

## 2015-12-04 ENCOUNTER — Ambulatory Visit (INDEPENDENT_AMBULATORY_CARE_PROVIDER_SITE_OTHER): Payer: Medicare Other | Admitting: Internal Medicine

## 2015-12-04 VITALS — BP 102/64 | HR 60 | Temp 97.4°F | Ht 68.0 in | Wt 179.9 lb

## 2015-12-04 DIAGNOSIS — D509 Iron deficiency anemia, unspecified: Secondary | ICD-10-CM

## 2015-12-04 DIAGNOSIS — K2270 Barrett's esophagus without dysplasia: Secondary | ICD-10-CM | POA: Diagnosis not present

## 2015-12-04 DIAGNOSIS — Z86010 Personal history of colonic polyps: Secondary | ICD-10-CM | POA: Diagnosis not present

## 2015-12-04 NOTE — Telephone Encounter (Signed)
No follow up needed

## 2015-12-04 NOTE — Patient Instructions (Signed)
OV in 1 year.  

## 2015-12-04 NOTE — Progress Notes (Signed)
Subjective:    Patient ID: [name], female    DOB: 04/25/1941, 75 y.o.   MRN: [id number]  [phone]  HPI Here today for f/u. She has not been seen for about 5 yrs at our office. Hx of Barrett's esophagus. Her last colonoscopy was in 2011. Two small polyps. Small cecal ablated via cold biopsy. Biopsy: Hyperplastic polyp She is doing okay.  She denies any dysphagia. She takes her pills one at a time. She has fx rt knee. She hit her knee on the side of a van. Splint in place.  Her weight was stated by granddaughter. Patient is unable to sleep. She denies acid reflux which is controlled with the Nexium.  She occasionally has breakthru.  She usually has a BM  Hx anemia. CBC normal.      Hx of atrial fib and maintained on Coumadin.  CBC    Component Value Date/Time   WBC 6.2 11/14/2015 1039   RBC 3.75* 11/14/2015 1039   HGB 12.3 11/14/2015 1039   HCT 37.0 11/14/2015 1039   PLT 248 11/14/2015 1039   MCV 98.7 11/14/2015 1039   MCH 32.8 11/14/2015 1039   MCHC 33.2 11/14/2015 1039   RDW 15.8* 11/14/2015 1039   LYMPHSABS 0.9 11/23/2011 1954   MONOABS 0.4 11/23/2011 1954   EOSABS 0.3 11/23/2011 1954   BASOSABS 0.0 11/23/2011 1954      07/16/2008 EGD: 4 cm Barrett's esophagus without ulceration or mass. The proximal margin was at 31 cm. The GE junction was at 35 cm and the hiatus at 41cm. Moderate to large sliding hiatal hernia.  Biopsy: No dysplasia or neoplasm identified. Incomplete intestinal metaplasia. Barrett's esophagus.  05/30/2004:  Esophagogastroduodenoscopy.    INDICATIONS:  [name] is a 75 year old Caucasian female with chronic GERD,  complicated by Barrett's esophagus who is here for surveillance colonoscopy.  She is on anti-reflux measures and double dose PPI and doing fairly well.  She denies dysphagia.  Her last EGD biopsy was in July 2003.  FINAL DIAGNOSIS:  1. A 4 cm length Barrett's esophagus without obvious mass or ulceration.  2. Moderate size sliding  hiatal hernia.  3. Proximal margin of Barrett's was at 33 cm from the incisors.  Hiatus was     at 41-42 and GE junction was estimated to be 37.  4. Moderate size sliding hiatal hernia.  5. Normal exam of the stomach and first and second partum of the duodenum  Review of Systems Past Medical History  Diagnosis Date  . Atrial fibrillation (HCC)     EF of 45%  . Premature ventricular contractions   . Hyperlipidemia     Recent lipid profile is excellent without lipid-lowering therapy  . Degenerative joint disease     status post right total hip arthroplasty  . Mental retardation   . Glaucoma   . GERD (gastroesophageal reflux disease)     -Barrett's esophagus  . Gout   . Adenomatous polyps     -resected via colonoscopy in 2011  . Anemia   . Glaucoma   . Hypertension   . Shortness of breath dyspnea   . Dysrhythmia   . Anxiety     MR    Past Surgical History  Procedure Laterality Date  . Total hip arthroplasty  2006    Right  . Cataract extraction  2006    left eye  . Colonoscopy w/ polypectomy  2011  . Eye surgery    . Joint replacement  x2  . Radiology with anesthesia Right 11/21/2015    Procedure: MRI OF RIGHT KNEE WITHOUT CONTAST    (RADIOLOGY WITH ANESTHESIA);  Surgeon: Medication Radiologist, MD;  Location: [facility];  Service: Radiology;  Laterality: Right;    No Known Allergies  Current Outpatient Prescriptions on File Prior to Visit  Medication Sig Dispense Refill  . allopurinol (ZYLOPRIM) 100 MG tablet Take 100 mg by mouth daily.      . Brinzolamide-Brimonidine (SIMBRINZA) 1-0.2 % SUSP Place 1 drop into both eyes 2 (two) times daily.     . Cholecalciferol (VITAMIN D3) 5000 units CAPS Take 1 capsule by mouth daily.     Marland Kitchen esomeprazole (NEXIUM) 40 MG capsule TAKE ONE CAPSULE BY MOUTH TWO TIMES DAILY 60 capsule 5  . ferrous gluconate (FERGON) 324 MG tablet TAKE 1 TABLET BY MOUTH TWICE DAILY WITH MEALS 60 tablet 0  . furosemide (LASIX) 20 MG tablet Take 40 mg by  mouth 2 (two) times daily.     Marland Kitchen latanoprost (XALATAN) 0.005 % ophthalmic solution Place 1 drop into both eyes at bedtime.      Marland Kitchen lisinopril (PRINIVIL,ZESTRIL) 10 MG tablet TAKE 1 TABLET BY MOUTH DAILY 90 tablet 3  . loratadine (CLARITIN) 10 MG tablet Take 10 mg by mouth as needed for allergies.    . metoprolol (TOPROL-XL) 200 MG 24 hr tablet TAKE 1 TABLET BY MOUTH DAILY 90 tablet 3  . neomycin-polymyxin-hydrocortisone (CORTISPORIN) 3.5-10000-1 otic suspension Place into both ears 4 (four) times daily.    . traMADol (ULTRAM) 50 MG tablet Take 1 tablet (50 mg total) by mouth every 4 (four) hours as needed. For pain 90 tablet 0  . warfarin (COUMADIN) 3 MG tablet Take 3 mg by mouth daily. Take one tablet (3mg ) by mouth daily    . potassium chloride SA (K-DUR,KLOR-CON) 20 MEQ tablet Take 20 mEq by mouth daily. Reported on 12/04/2015     No current facility-administered medications on file prior to visit.        Objective:   Physical Exam Blood pressure 102/64, pulse 60, temperature 97.4 F (36.3 C), height 5\' 8"  (1.727 m), weight 179 lb 14.4 oz (81.602 kg). Alert and oriented. Skin warm and dry. Oral mucosa is moist.   . Sclera anicteric, conjunctivae is pink. Thyroid not enlarged. No cervical lymphadenopathy. Lungs clear. Heart regular rate and rhythm.  Abdomen is soft. Bowel sounds are positive. No hepatomegaly. No abdominal masses felt. No tenderness.  No edema to lower extremities.   Patient examined from w/c. (Patient has a fx rt knee).         Assessment & Plan:  Barrett's esophagus. Needs surveillance. Will get colonoscopy biopsy report to see what type of polyp she has. Made need surveillance colonoscopy.  Biopysy showed Hyperplastic polyp. She need surveillance for Barrett's esophagus.

## 2015-12-05 ENCOUNTER — Telehealth (INDEPENDENT_AMBULATORY_CARE_PROVIDER_SITE_OTHER): Payer: Self-pay | Admitting: *Deleted

## 2015-12-05 ENCOUNTER — Ambulatory Visit (INDEPENDENT_AMBULATORY_CARE_PROVIDER_SITE_OTHER): Payer: Medicare Other | Admitting: Cardiovascular Disease

## 2015-12-05 ENCOUNTER — Encounter: Payer: Medicare Other | Admitting: Cardiovascular Disease

## 2015-12-05 ENCOUNTER — Encounter (INDEPENDENT_AMBULATORY_CARE_PROVIDER_SITE_OTHER): Payer: Self-pay | Admitting: *Deleted

## 2015-12-05 ENCOUNTER — Encounter: Payer: Self-pay | Admitting: Cardiovascular Disease

## 2015-12-05 DIAGNOSIS — I482 Chronic atrial fibrillation, unspecified: Secondary | ICD-10-CM

## 2015-12-05 DIAGNOSIS — I10 Essential (primary) hypertension: Secondary | ICD-10-CM

## 2015-12-05 DIAGNOSIS — I5032 Chronic diastolic (congestive) heart failure: Secondary | ICD-10-CM | POA: Diagnosis not present

## 2015-12-05 MED ORDER — FUROSEMIDE 40 MG PO TABS: ORAL_TABLET | ORAL | Status: DC

## 2015-12-05 NOTE — Progress Notes (Signed)
Patient ID: [name], female   DOB: May 05, 1941, 75 y.o.   MRN: [id number]      SUBJECTIVE: [name] is a very nice woman with a history of chronic diastolic congestive heart failure, atrial fibrillation, large hiatal hernia, anemia, and moderately severe chronic kidney disease. She has a lifelong history of mental retardation, but is responsive to questions and contributes some information. Bulk of the history is provided by her niece. [name] lives in a trailer in her sister's backyard.  The patient denies chest pain, leg swelling, palpitations, and shortness of breath.  Echocardiogram performed at an outside facility on 11/07/15 reportedly demonstrated normal left ventricular size and function a small pericardial effusion. There were no significant valvular abnormalities reported.   Review of Systems: As per "subjective", otherwise negative.  No Known Allergies  Current Outpatient Prescriptions  Medication Sig Dispense Refill  . allopurinol (ZYLOPRIM) 100 MG tablet Take 100 mg by mouth daily.      . Brinzolamide-Brimonidine (SIMBRINZA) 1-0.2 % SUSP Place 1 drop into both eyes 2 (two) times daily.     . Cholecalciferol (VITAMIN D3) 5000 units CAPS Take 1 capsule by mouth daily.     Marland Kitchen esomeprazole (NEXIUM) 40 MG capsule TAKE ONE CAPSULE BY MOUTH TWO TIMES DAILY 60 capsule 5  . ferrous gluconate (FERGON) 324 MG tablet TAKE 1 TABLET BY MOUTH TWICE DAILY WITH MEALS 60 tablet 0  . furosemide (LASIX) 20 MG tablet Take 40 mg by mouth 2 (two) times daily.     Marland Kitchen latanoprost (XALATAN) 0.005 % ophthalmic solution Place 1 drop into both eyes at bedtime.      Marland Kitchen lisinopril (PRINIVIL,ZESTRIL) 10 MG tablet TAKE 1 TABLET BY MOUTH DAILY 90 tablet 3  . loratadine (CLARITIN) 10 MG tablet Take 10 mg by mouth as needed for allergies.    . metoprolol (TOPROL-XL) 200 MG 24 hr tablet TAKE 1 TABLET BY MOUTH DAILY 90 tablet 3  . neomycin-polymyxin-hydrocortisone (CORTISPORIN) 3.5-10000-1 otic suspension  Place into both ears 4 (four) times daily.    . traMADol (ULTRAM) 50 MG tablet Take 1 tablet (50 mg total) by mouth every 4 (four) hours as needed. For pain 90 tablet 0  . warfarin (COUMADIN) 3 MG tablet Take 3 mg by mouth daily. Take one tablet (3mg ) by mouth daily     No current facility-administered medications for this visit.    Past Medical History  Diagnosis Date  . Atrial fibrillation (HCC)     EF of 45%  . Premature ventricular contractions   . Hyperlipidemia     Recent lipid profile is excellent without lipid-lowering therapy  . Degenerative joint disease     status post right total hip arthroplasty  . Mental retardation   . Glaucoma   . GERD (gastroesophageal reflux disease)     -Barrett's esophagus  . Gout   . Adenomatous polyps     -resected via colonoscopy in 2011  . Anemia   . Glaucoma   . Hypertension   . Shortness of breath dyspnea   . Dysrhythmia   . Anxiety     MR    Past Surgical History  Procedure Laterality Date  . Total hip arthroplasty  2006    Right  . Cataract extraction  2006    left eye  . Colonoscopy w/ polypectomy  2011  . Eye surgery    . Joint replacement      x2  . Radiology with anesthesia Right 11/21/2015    Procedure:  MRI OF RIGHT KNEE WITHOUT CONTAST    (RADIOLOGY WITH ANESTHESIA);  Surgeon: Medication Radiologist, MD;  Location: [facility];  Service: Radiology;  Laterality: Right;    Social History   Social History  . Marital Status: Single    Spouse Name: N/A  . Number of Children: N/A  . Years of Education: N/A   Occupational History  . disabled    Social History Main Topics  . Smoking status: Never Smoker   . Smokeless tobacco: Never Used  . Alcohol Use: No  . Drug Use: No  . Sexual Activity: Not on file   Other Topics Concern  . Not on file   Social History Narrative     Filed Vitals:   12/05/15 1033  BP: 124/76  Pulse: 86  Height: 5\' 8"  (1.727 m)  Weight: 190 lb (86.183 kg)  SpO2: 93%    PHYSICAL  EXAM General: NAD HEENT: Normal. Neck: No JVD, no thyromegaly. Lungs: Clear to auscultation bilaterally with normal respiratory effort. CV: Nondisplaced PMI. Irregular rhythm, normal S1/S2, no S3, no murmur. Trivial pretibial and periankle edema. Stasis dermatitis b/l.  Abdomen: Soft, nontender, obese.  Neurologic: Alert and oriented.  Psych: Normal affect. Skin: Stasis dermatitis b/l lower extremities. Musculoskeletal: No gross deformities. Extremities: No clubbing or cyanosis.   ECG: Most recent ECG reviewed.      ASSESSMENT AND PLAN: 1. Atrial fibrillation: Rate is controlled on current therapy. No changes to metoprolol dose. Warfarin managed by Dr. Anastasio Champion.  2. Chronic diastolic heart failure: Euvolemic and compensated. Will reduce Lasix to 40 mg daily.  3. Essential HTN: Controlled on lisinopril 10 mg. No changes.  Dispo: f/u 1 year.   Kate Sable, M.D., F.A.C.C.

## 2015-12-05 NOTE — Patient Instructions (Signed)
Your physician wants you to follow-up in: 1 year with Dr Virgina Jock will receive a reminder letter in the mail two months in advance. If you don't receive a letter, please call our office to schedule the follow-up appointment.    Take lasix 40 mg only as needed for leg swelling    If you need a refill on your cardiac medications before your next appointment, please call your pharmacy.     Thank you for choosing [facility] !

## 2015-12-05 NOTE — Progress Notes (Signed)
Patient ID: [name], female   DOB: [birth year], 75 y.o.   MRN: [id number]      SUBJECTIVE: The patient presents for follow-up of chronic diastolic heart failure, atrial fibrillation, and hypertension. An echocardiogram performed at an outside facility on 11/07/15 reportedly demonstrated normal left ventricular function and a small pericardial effusion.  She has a lifelong history of mental retardation, but is responsive to questions and contributes some information. Bulk of the history is provided by her niece [name]). [name] lives in a trailer in her sister's backyard.  The patient denies chest pain, leg swelling, palpitations, and shortness of breath. Her niece mentions that she has been urinating frequently and has lost a lot of weight. She was recently diagnosed with a knee fracture.    Review of Systems: As per "subjective", otherwise negative.  No Known Allergies  Current Outpatient Prescriptions  Medication Sig Dispense Refill  . allopurinol (ZYLOPRIM) 100 MG tablet Take 100 mg by mouth daily.      . Brinzolamide-Brimonidine (SIMBRINZA) 1-0.2 % SUSP Place 1 drop into both eyes 2 (two) times daily.     . Cholecalciferol (VITAMIN D3) 5000 units CAPS Take 1 capsule by mouth daily.     Marland Kitchen esomeprazole (NEXIUM) 40 MG capsule TAKE ONE CAPSULE BY MOUTH TWO TIMES DAILY 60 capsule 5  . ferrous gluconate (FERGON) 324 MG tablet TAKE 1 TABLET BY MOUTH TWICE DAILY WITH MEALS 60 tablet 0  . furosemide (LASIX) 20 MG tablet Take 40 mg by mouth 2 (two) times daily.     Marland Kitchen latanoprost (XALATAN) 0.005 % ophthalmic solution Place 1 drop into both eyes at bedtime.      Marland Kitchen lisinopril (PRINIVIL,ZESTRIL) 10 MG tablet TAKE 1 TABLET BY MOUTH DAILY 90 tablet 3  . loratadine (CLARITIN) 10 MG tablet Take 10 mg by mouth as needed for allergies.    . metoprolol (TOPROL-XL) 200 MG 24 hr tablet TAKE 1 TABLET BY MOUTH DAILY 90 tablet 3  . neomycin-polymyxin-hydrocortisone (CORTISPORIN) 3.5-10000-1 otic  suspension Place into both ears 4 (four) times daily.    . traMADol (ULTRAM) 50 MG tablet Take 1 tablet (50 mg total) by mouth every 4 (four) hours as needed. For pain 90 tablet 0  . warfarin (COUMADIN) 3 MG tablet Take 3 mg by mouth daily. Take one tablet (3mg ) by mouth daily     No current facility-administered medications for this visit.    Past Medical History  Diagnosis Date  . Atrial fibrillation (HCC)     EF of 45%  . Premature ventricular contractions   . Hyperlipidemia     Recent lipid profile is excellent without lipid-lowering therapy  . Degenerative joint disease     status post right total hip arthroplasty  . Mental retardation   . Glaucoma   . GERD (gastroesophageal reflux disease)     -Barrett's esophagus  . Gout   . Adenomatous polyps     -resected via colonoscopy in 2011  . Anemia   . Glaucoma   . Hypertension   . Shortness of breath dyspnea   . Dysrhythmia   . Anxiety     MR    Past Surgical History  Procedure Laterality Date  . Total hip arthroplasty  2006    Right  . Cataract extraction  2006    left eye  . Colonoscopy w/ polypectomy  2011  . Eye surgery    . Joint replacement      x2  . Radiology with anesthesia Right  11/21/2015    Procedure: MRI OF RIGHT KNEE WITHOUT CONTAST    (RADIOLOGY WITH ANESTHESIA);  Surgeon: Medication Radiologist, MD;  Location: [facility];  Service: Radiology;  Laterality: Right;    Social History   Social History  . Marital Status: Single    Spouse Name: N/A  . Number of Children: N/A  . Years of Education: N/A   Occupational History  . disabled    Social History Main Topics  . Smoking status: Never Smoker   . Smokeless tobacco: Never Used  . Alcohol Use: No  . Drug Use: No  . Sexual Activity: Not on file   Other Topics Concern  . Not on file   Social History Narrative    Vitals: BP: 124/76 HR: 86 Wt: 190 lbs Sats: 93%  PHYSICAL EXAM General: NAD HEENT: Normal. Neck: No JVD, no  thyromegaly. Lungs: Clear to auscultation bilaterally with normal respiratory effort. CV: Nondisplaced PMI.  Normal HR, irregular rhythm, normal S1/S2, no S3, no murmur. No pretibial or periankle edema.    Abdomen: Soft, nontender, no distention.  Neurologic: Alert.  Skin: Normal.  ECG: Most recent ECG reviewed.      ASSESSMENT AND PLAN: 1. Atrial fibrillation: Rate is controlled on current therapy. No changes to metoprolol dose. Warfarin managed by Dr. Anastasio Champion.  2. Chronic diastolic heart failure: Euvolemic and compensated. Will stop scheduled Lasix and change to 40 mg prn leg swelling.  3. Essential HTN: Controlled. No changes.  Dispo: f/u 1 year.   Kate Sable, M.D., F.A.C.C.

## 2015-12-05 NOTE — Telephone Encounter (Signed)
Per Dr Anastasio Champion, it is ok for [name] to stop Coumadin 5 days prior to EGD

## 2015-12-11 ENCOUNTER — Telehealth: Payer: Self-pay | Admitting: Cardiovascular Disease

## 2015-12-11 MED ORDER — LISINOPRIL 20 MG PO TABS: 20.0000 mg | ORAL_TABLET | Freq: Every day | ORAL | Status: DC

## 2015-12-11 NOTE — Addendum Note (Signed)
Addended by: Levonne Hubert on: 12/11/2015 05:25 PM   Modules accepted: Orders

## 2015-12-11 NOTE — Telephone Encounter (Signed)
Spoke with niece who states that pt's BP is 155/101,  149/102. Pt denies CP, SOB, and increase swelling at this time. Niece states that pt was unable to sleep well last night and has been napping all day today. Encouraged niece to have pt seen. Will forward to Dr. Bronson Ing.

## 2015-12-11 NOTE — Telephone Encounter (Signed)
Pt's BP is high and she has been having dizzy spells all day

## 2015-12-11 NOTE — Telephone Encounter (Signed)
Patient notified

## 2015-12-11 NOTE — Telephone Encounter (Signed)
Increase lisinopril to 20 mg daily

## 2015-12-16 ENCOUNTER — Emergency Department (HOSPITAL_COMMUNITY)
Admission: EM | Admit: 2015-12-16 | Discharge: 2015-12-17 | Disposition: A | Discharge status: Home / Self Care | Payer: Medicare Other | Attending: Emergency Medicine | Admitting: Emergency Medicine

## 2015-12-16 ENCOUNTER — Encounter (HOSPITAL_COMMUNITY): Payer: Self-pay | Admitting: *Deleted

## 2015-12-16 ENCOUNTER — Telehealth: Payer: Self-pay | Admitting: Cardiovascular Disease

## 2015-12-16 DIAGNOSIS — F79 Unspecified intellectual disabilities: Secondary | ICD-10-CM | POA: Diagnosis not present

## 2015-12-16 DIAGNOSIS — I10 Essential (primary) hypertension: Secondary | ICD-10-CM | POA: Insufficient documentation

## 2015-12-16 LAB — BASIC METABOLIC PANEL
ANION GAP: 9 (ref 5–15)
BUN: 8 mg/dL (ref 6–20)
CALCIUM: 9.5 mg/dL (ref 8.9–10.3)
CHLORIDE: 103 mmol/L (ref 101–111)
CO2: 27 mmol/L (ref 22–32)
Creatinine, Ser: 1.3 mg/dL — ABNORMAL HIGH (ref 0.44–1.00)
GFR calc non Af Amer: 40 mL/min — ABNORMAL LOW (ref >60)
GFR, EST AFRICAN AMERICAN: 46 mL/min — AB (ref >60)
GLUCOSE: 96 mg/dL (ref 65–99)
POTASSIUM: 4.3 mmol/L (ref 3.5–5.1)
Sodium: 139 mmol/L (ref 135–145)

## 2015-12-16 LAB — CBC
HEMATOCRIT: 37 % (ref 36.0–46.0)
HEMOGLOBIN: 12.2 g/dL (ref 12.0–15.0)
MCH: 32.3 pg (ref 26.0–34.0)
MCHC: 33 g/dL (ref 30.0–36.0)
MCV: 97.9 fL (ref 78.0–100.0)
Platelets: 241 10*3/uL (ref 150–400)
RBC: 3.78 MIL/uL — AB (ref 3.87–5.11)
RDW: 16 % — ABNORMAL HIGH (ref 11.5–15.5)
WBC: 6.4 10*3/uL (ref 4.0–10.5)

## 2015-12-16 LAB — TROPONIN I

## 2015-12-16 NOTE — Telephone Encounter (Signed)
Patient's granddaughter states that pt is still having "hot spells" and BP is running "high" @ 141/93. / tg

## 2015-12-16 NOTE — Telephone Encounter (Signed)
See 12/11/15 phone note  Will forward to Dr Bronson Ing for advice

## 2015-12-16 NOTE — ED Notes (Signed)
Pt was sent here by PCP for HTN that has persisted over the past 4 days. Before coming to hospital pt had episode of dizziness that got better when she got here. Pt denies any weakness in one side or the other. Caregiver is at bedside.

## 2015-12-16 NOTE — Telephone Encounter (Signed)
Lisinopril just increased and BP of 141/93 should not be causing those symptoms. Would not make lisinopril adjustment at this time. Would also seek PCP care.

## 2015-12-16 NOTE — ED Notes (Signed)
Pt requesting to leave at this time. Pt BP and vs retaken. Pt verbalizes "I can die at home just as easy as I can die here." Pt made aware her VS were stable and that she would soon be going to a room. Pt requesting to leave.

## 2015-12-17 NOTE — Telephone Encounter (Signed)
Spoke with niece who with contact PCP for an appt.

## 2015-12-23 DIAGNOSIS — Z5181 Encounter for therapeutic drug level monitoring: Secondary | ICD-10-CM | POA: Diagnosis not present

## 2015-12-23 DIAGNOSIS — E559 Vitamin D deficiency, unspecified: Secondary | ICD-10-CM | POA: Diagnosis not present

## 2015-12-23 DIAGNOSIS — R42 Dizziness and giddiness: Secondary | ICD-10-CM | POA: Diagnosis not present

## 2015-12-23 DIAGNOSIS — N182 Chronic kidney disease, stage 2 (mild): Secondary | ICD-10-CM | POA: Diagnosis not present

## 2015-12-26 ENCOUNTER — Emergency Department (HOSPITAL_COMMUNITY): Payer: Medicare Other

## 2015-12-26 ENCOUNTER — Observation Stay (HOSPITAL_COMMUNITY)
Admission: EM | Admit: 2015-12-26 | Discharge: 2015-12-27 | Disposition: A | Discharge status: Home / Self Care | Payer: Medicare Other | Attending: Internal Medicine | Admitting: Internal Medicine

## 2015-12-26 ENCOUNTER — Encounter (HOSPITAL_COMMUNITY): Payer: Self-pay | Admitting: Emergency Medicine

## 2015-12-26 DIAGNOSIS — E785 Hyperlipidemia, unspecified: Secondary | ICD-10-CM | POA: Diagnosis not present

## 2015-12-26 DIAGNOSIS — Z86018 Personal history of other benign neoplasm: Secondary | ICD-10-CM | POA: Insufficient documentation

## 2015-12-26 DIAGNOSIS — N183 Chronic kidney disease, stage 3 unspecified: Secondary | ICD-10-CM | POA: Diagnosis present

## 2015-12-26 DIAGNOSIS — H409 Unspecified glaucoma: Secondary | ICD-10-CM | POA: Insufficient documentation

## 2015-12-26 DIAGNOSIS — I499 Cardiac arrhythmia, unspecified: Secondary | ICD-10-CM | POA: Diagnosis not present

## 2015-12-26 DIAGNOSIS — R0600 Dyspnea, unspecified: Secondary | ICD-10-CM | POA: Diagnosis not present

## 2015-12-26 DIAGNOSIS — R404 Transient alteration of awareness: Secondary | ICD-10-CM | POA: Diagnosis not present

## 2015-12-26 DIAGNOSIS — F419 Anxiety disorder, unspecified: Secondary | ICD-10-CM | POA: Insufficient documentation

## 2015-12-26 DIAGNOSIS — Z7901 Long term (current) use of anticoagulants: Secondary | ICD-10-CM | POA: Insufficient documentation

## 2015-12-26 DIAGNOSIS — D649 Anemia, unspecified: Secondary | ICD-10-CM | POA: Diagnosis not present

## 2015-12-26 DIAGNOSIS — I10 Essential (primary) hypertension: Secondary | ICD-10-CM | POA: Insufficient documentation

## 2015-12-26 DIAGNOSIS — I4891 Unspecified atrial fibrillation: Secondary | ICD-10-CM | POA: Diagnosis not present

## 2015-12-26 DIAGNOSIS — R55 Syncope and collapse: Principal | ICD-10-CM | POA: Insufficient documentation

## 2015-12-26 DIAGNOSIS — K219 Gastro-esophageal reflux disease without esophagitis: Secondary | ICD-10-CM | POA: Diagnosis not present

## 2015-12-26 DIAGNOSIS — R0789 Other chest pain: Secondary | ICD-10-CM | POA: Diagnosis not present

## 2015-12-26 DIAGNOSIS — F79 Unspecified intellectual disabilities: Secondary | ICD-10-CM

## 2015-12-26 DIAGNOSIS — R05 Cough: Secondary | ICD-10-CM | POA: Diagnosis not present

## 2015-12-26 DIAGNOSIS — Z79899 Other long term (current) drug therapy: Secondary | ICD-10-CM | POA: Diagnosis not present

## 2015-12-26 DIAGNOSIS — I493 Ventricular premature depolarization: Secondary | ICD-10-CM | POA: Insufficient documentation

## 2015-12-26 DIAGNOSIS — I482 Chronic atrial fibrillation: Secondary | ICD-10-CM | POA: Diagnosis not present

## 2015-12-26 DIAGNOSIS — M1990 Unspecified osteoarthritis, unspecified site: Secondary | ICD-10-CM | POA: Insufficient documentation

## 2015-12-26 LAB — CBC WITH DIFFERENTIAL/PLATELET
BASOS ABS: 0 10*3/uL (ref 0.0–0.1)
BASOS PCT: 0 %
EOS PCT: 3 %
Eosinophils Absolute: 0.2 10*3/uL (ref 0.0–0.7)
HEMATOCRIT: 35.3 % — AB (ref 36.0–46.0)
HEMOGLOBIN: 11.5 g/dL — AB (ref 12.0–15.0)
LYMPHS PCT: 15 %
Lymphs Abs: 1 10*3/uL (ref 0.7–4.0)
MCH: 31.7 pg (ref 26.0–34.0)
MCHC: 32.6 g/dL (ref 30.0–36.0)
MCV: 97.2 fL (ref 78.0–100.0)
Monocytes Absolute: 0.5 10*3/uL (ref 0.1–1.0)
Monocytes Relative: 7 %
Neutro Abs: 4.9 10*3/uL (ref 1.7–7.7)
Neutrophils Relative %: 75 %
Platelets: 248 10*3/uL (ref 150–400)
RBC: 3.63 MIL/uL — AB (ref 3.87–5.11)
RDW: 16.2 % — AB (ref 11.5–15.5)
WBC: 6.6 10*3/uL (ref 4.0–10.5)

## 2015-12-26 LAB — PROTIME-INR
INR: 3.12 — AB (ref 0.00–1.49)
PROTHROMBIN TIME: 31.5 s — AB (ref 11.6–15.2)

## 2015-12-26 LAB — INFLUENZA PANEL BY PCR (TYPE A & B)
H1N1 flu by pcr: NOT DETECTED
Influenza A By PCR: NEGATIVE
Influenza B By PCR: NEGATIVE

## 2015-12-26 LAB — TROPONIN I: Troponin I: <0.03 ng/mL (ref <0.031)

## 2015-12-26 LAB — BASIC METABOLIC PANEL
ANION GAP: 8 (ref 5–15)
BUN: 10 mg/dL (ref 6–20)
CALCIUM: 8.6 mg/dL — AB (ref 8.9–10.3)
CHLORIDE: 103 mmol/L (ref 101–111)
CO2: 27 mmol/L (ref 22–32)
Creatinine, Ser: 1.27 mg/dL — ABNORMAL HIGH (ref 0.44–1.00)
GFR calc non Af Amer: 41 mL/min — ABNORMAL LOW (ref >60)
GFR, EST AFRICAN AMERICAN: 47 mL/min — AB (ref >60)
Glucose, Bld: 91 mg/dL (ref 65–99)
POTASSIUM: 3.6 mmol/L (ref 3.5–5.1)
Sodium: 138 mmol/L (ref 135–145)

## 2015-12-26 LAB — TSH: TSH: 1.525 u[IU]/mL (ref 0.350–4.500)

## 2015-12-26 LAB — C DIFFICILE QUICK SCREEN W PCR REFLEX
C DIFFICILE (CDIFF) INTERP: NEGATIVE
C DIFFICLE (CDIFF) ANTIGEN: NEGATIVE
C Diff toxin: NEGATIVE

## 2015-12-26 MED ORDER — PANTOPRAZOLE SODIUM 40 MG PO TBEC
80.0000 mg | DELAYED_RELEASE_TABLET | Freq: Every day | ORAL | Status: DC
  Administered 2015-12-27: 80 mg via ORAL
  Filled 2015-12-26: qty 2

## 2015-12-26 MED ORDER — SODIUM CHLORIDE 0.9% FLUSH
3.0000 mL | Freq: Two times a day (BID) | INTRAVENOUS | Status: DC
  Administered 2015-12-26 – 2015-12-27 (×2): 3 mL via INTRAVENOUS

## 2015-12-26 MED ORDER — SODIUM CHLORIDE 0.9% FLUSH: 3.0000 mL | INTRAVENOUS | Status: DC | PRN

## 2015-12-26 MED ORDER — ACETAMINOPHEN 325 MG PO TABS: 650.0000 mg | ORAL_TABLET | Freq: Four times a day (QID) | ORAL | Status: DC | PRN

## 2015-12-26 MED ORDER — ONDANSETRON HCL 4 MG PO TABS: 4.0000 mg | ORAL_TABLET | Freq: Four times a day (QID) | ORAL | Status: DC | PRN

## 2015-12-26 MED ORDER — ONDANSETRON HCL 4 MG/2ML IJ SOLN: 4.0000 mg | Freq: Four times a day (QID) | INTRAMUSCULAR | Status: DC | PRN

## 2015-12-26 MED ORDER — WARFARIN - PHARMACIST DOSING INPATIENT
Status: DC
Start: 2015-12-27 — End: 2015-12-27

## 2015-12-26 MED ORDER — LATANOPROST 0.005 % OP SOLN
1.0000 [drp] | Freq: Every day | OPHTHALMIC | Status: DC
  Administered 2015-12-26: 1 [drp] via OPHTHALMIC
  Filled 2015-12-26: qty 2.5

## 2015-12-26 MED ORDER — SENNOSIDES-DOCUSATE SODIUM 8.6-50 MG PO TABS: 1.0000 | ORAL_TABLET | Freq: Every evening | ORAL | Status: DC | PRN

## 2015-12-26 MED ORDER — ALLOPURINOL 100 MG PO TABS
100.0000 mg | ORAL_TABLET | Freq: Every day | ORAL | Status: DC
  Administered 2015-12-27: 100 mg via ORAL
  Filled 2015-12-26: qty 1

## 2015-12-26 MED ORDER — LORATADINE 10 MG PO TABS: 10.0000 mg | ORAL_TABLET | Freq: Every day | ORAL | Status: DC | PRN

## 2015-12-26 MED ORDER — METOPROLOL SUCCINATE ER 50 MG PO TB24
200.0000 mg | ORAL_TABLET | Freq: Every day | ORAL | Status: DC
  Administered 2015-12-27: 200 mg via ORAL
  Filled 2015-12-26: qty 4

## 2015-12-26 MED ORDER — SODIUM CHLORIDE 0.9 % IV SOLN: 250.0000 mL | INTRAVENOUS | Status: DC | PRN

## 2015-12-26 MED ORDER — FERROUS GLUCONATE 324 (38 FE) MG PO TABS
324.0000 mg | ORAL_TABLET | Freq: Two times a day (BID) | ORAL | Status: DC
  Administered 2015-12-27: 324 mg via ORAL
  Filled 2015-12-26 (×7): qty 1

## 2015-12-26 MED ORDER — ACETAMINOPHEN 650 MG RE SUPP: 650.0000 mg | Freq: Four times a day (QID) | RECTAL | Status: DC | PRN

## 2015-12-26 MED ORDER — TRAMADOL HCL 50 MG PO TABS: 50.0000 mg | ORAL_TABLET | Freq: Four times a day (QID) | ORAL | Status: DC | PRN

## 2015-12-26 MED ORDER — LISINOPRIL 10 MG PO TABS
20.0000 mg | ORAL_TABLET | Freq: Two times a day (BID) | ORAL | Status: DC
  Administered 2015-12-26 – 2015-12-27 (×2): 20 mg via ORAL
  Filled 2015-12-26 (×2): qty 2

## 2015-12-26 MED ORDER — SODIUM CHLORIDE 0.9% FLUSH
3.0000 mL | Freq: Two times a day (BID) | INTRAVENOUS | Status: DC
  Administered 2015-12-26: 3 mL via INTRAVENOUS

## 2015-12-26 NOTE — H&P (Signed)
[facility]          History and Physical    PCP:   Doree Albee, MD   EDP: Orlie Dakin, MD  Chief Complaint:  Syncope  HPI: Patient is a 75 year old woman with multiple medical comorbidities including atrial fibrillation maintained on chronic anticoagulation with Coumadin, hypertension, mental retardation, hyperlipidemia among other things who presents to the hospital today with a syncopal event. History is given by patient's nurse and caregiver [name]. She tells me that patient was lying on the couch and they were talking when all of a sudden she simply became unresponsive. Her eyes closed her head went to the side. This episode lasted about 20 seconds. There was no twitching movement of her extremities, no biting of the tongue, no loss of bowel or bladder continence. After 20 seconds she open her eyes and was back to her normal although she does not have any recollection of this event. EMS was called and they brought her into the hospital for evaluation. In the ED she was found to be in atrial fibrillation at a controlled rate, lab work is essentially unremarkable. No evidence of ongoing infection, she is afebrile, no leukocytosis. We have been asked to admit her for observation and further management.  Allergies:  No Known Allergies    Past Medical History  Diagnosis Date  . Atrial fibrillation (HCC)     EF of 45%  . Premature ventricular contractions   . Hyperlipidemia     Recent lipid profile is excellent without lipid-lowering therapy  . Degenerative joint disease     status post right total hip arthroplasty  . Mental retardation   . Glaucoma   . GERD (gastroesophageal reflux disease)     -Barrett's esophagus  . Gout   . Adenomatous polyps     -resected via colonoscopy in 2011  . Anemia   . Glaucoma   . Hypertension   . Shortness of breath dyspnea   . Dysrhythmia   . Anxiety     MR    Past Surgical History  Procedure Laterality Date   . Total hip arthroplasty  2006    Right  . Cataract extraction  2006    left eye  . Colonoscopy w/ polypectomy  2011  . Eye surgery    . Joint replacement      x2  . Radiology with anesthesia Right 11/21/2015    Procedure: MRI OF RIGHT KNEE WITHOUT CONTAST    (RADIOLOGY WITH ANESTHESIA);  Surgeon: Medication Radiologist, MD;  Location: [place];  Service: Radiology;  Laterality: Right;    Prior to Admission medications   Medication Sig Start Date End Date Taking? Authorizing Provider  allopurinol (ZYLOPRIM) 100 MG tablet Take 100 mg by mouth daily.     Yes Historical Provider, MD  Brinzolamide-Brimonidine [facility]) 1-0.2 % SUSP Place 1 drop into both eyes 2 (two) times daily.    Yes Historical Provider, MD  Cholecalciferol (VITAMIN D3) 5000 units CAPS Take 1 capsule by mouth daily.    Yes Historical Provider, MD  esomeprazole (NEXIUM) 40 MG capsule TAKE ONE CAPSULE BY MOUTH TWO TIMES DAILY 03/22/15  Yes Butch Penny, NP  ferrous gluconate (FERGON) 324 MG tablet TAKE 1 TABLET BY MOUTH TWICE DAILY WITH MEALS 08/20/14  Yes Herminio Commons, MD  furosemide (LASIX) 40 MG tablet Take daily as needed for leg swelling Patient taking differently: Take 20 mg by mouth  daily as needed for fluid. Take daily as needed for leg swelling 12/05/15  Yes Herminio Commons, MD  latanoprost (XALATAN) 0.005 % ophthalmic solution Place 1 drop into both eyes at bedtime.     Yes Historical Provider, MD  lisinopril (PRINIVIL,ZESTRIL) 20 MG tablet Take 1 tablet (20 mg total) by mouth daily. Patient taking differently: Take 20 mg by mouth 2 (two) times daily.  12/11/15  Yes Herminio Commons, MD  loratadine (CLARITIN) 10 MG tablet Take 10 mg by mouth as needed for allergies.   Yes Historical Provider, MD  meclizine (ANTIVERT) 25 MG tablet Take 0.5 tablets by mouth 2 (two) times daily as needed. 12/23/15  Yes Historical Provider, MD  metoprolol (TOPROL-XL) 200 MG 24 hr tablet TAKE 1 TABLET BY MOUTH DAILY 04/17/15  Yes  Herminio Commons, MD  neomycin-polymyxin-hydrocortisone (CORTISPORIN) 3.5-10000-1 otic suspension Place 2 drops into both ears 4 (four) times daily.    Yes Historical Provider, MD  traMADol (ULTRAM) 50 MG tablet Take 1 tablet (50 mg total) by mouth every 4 (four) hours as needed. For pain 11/26/15  Yes Carole Civil, MD  warfarin (COUMADIN) 3 MG tablet Take 3 mg by mouth daily. Take one tablet (47m) by mouth daily   Yes Historical Provider, MD    Social History:  reports that she has never smoked. She has never used smokeless tobacco. She reports that she does not drink alcohol or use illicit drugs.   Only significant for hypertension in multiple family members  Review of Systems:  She denies fever, chills, chest discomfort, abdominal pain.  Physical Exam: Blood pressure 124/94, pulse 83, temperature 97.4 F (36.3 C), temperature source Oral, resp. rate 20, SpO2 99 %. General: Alert, awake, can tell me her name HEENT: Normocephalic, atraumatic, pupils equal round and reactive to light, extraocular movements intact, moist mucous membranes Neck: Supple, no JVD, no lymphadenopathy, no bruits, no goiter Cardiovascular: Irregular rhythm, regular rate, no murmurs rubs or gallops Lungs: Clear to auscultation bilaterally  Abdomen: Soft, nontender, nondistended, positive bowel sounds Extremities: 1+ pitting edema bilaterally, positive pulses Neurologic: Moves all 4 spontaneously  Labs on Admission:  Results for orders placed or performed during the hospital encounter of 12/26/15 (from the past 48 hour(s))  CBC with Differential/Platelet     Status: Abnormal   Collection Time: 12/26/15  1:46 PM  Result Value Ref Range   WBC 6.6 4.0 - 10.5 K/uL   RBC 3.63 (L) 3.87 - 5.11 MIL/uL   Hemoglobin 11.5 (L) 12.0 - 15.0 g/dL   HCT 35.3 (L) 36.0 - 46.0 %   MCV 97.2 78.0 - 100.0 fL   MCH 31.7 26.0 - 34.0 pg   MCHC 32.6 30.0 - 36.0 g/dL   RDW 16.2 (H) 11.5 - 15.5 %   Platelets 248 150 - 400  K/uL   Neutrophils Relative % 75 %   Neutro Abs 4.9 1.7 - 7.7 K/uL   Lymphocytes Relative 15 %   Lymphs Abs 1.0 0.7 - 4.0 K/uL   Monocytes Relative 7 %   Monocytes Absolute 0.5 0.1 - 1.0 K/uL   Eosinophils Relative 3 %   Eosinophils Absolute 0.2 0.0 - 0.7 K/uL   Basophils Relative 0 %   Basophils Absolute 0.0 0.0 - 0.1 K/uL  Basic metabolic panel     Status: Abnormal   Collection Time: 12/26/15  1:46 PM  Result Value Ref Range   Sodium 138 135 - 145 mmol/L   Potassium 3.6 3.5 - 5.1  mmol/L   Chloride 103 101 - 111 mmol/L   CO2 27 22 - 32 mmol/L   Glucose, Bld 91 65 - 99 mg/dL   BUN 10 6 - 20 mg/dL   Creatinine, Ser 1.27 (H) 0.44 - 1.00 mg/dL   Calcium 8.6 (L) 8.9 - 10.3 mg/dL   GFR calc non Af Amer 41 (L) >60 mL/min   GFR calc Af Amer 47 (L) >60 mL/min    Comment: (NOTE) The eGFR has been calculated using the CKD EPI equation. This calculation has not been validated in all clinical situations. eGFR's persistently <60 mL/min signify possible Chronic Kidney Disease.    Anion gap 8 5 - 15  Troponin I     Status: None   Collection Time: 12/26/15  1:46 PM  Result Value Ref Range   Troponin I <0.03 <0.031 ng/mL    Comment:        NO INDICATION OF MYOCARDIAL INJURY.   Protime-INR     Status: Abnormal   Collection Time: 12/26/15  1:46 PM  Result Value Ref Range   Prothrombin Time 31.5 (H) 11.6 - 15.2 seconds   INR 3.12 (H) 0.00 - 1.49    Radiological Exams on Admission: Dg Chest Port 1 View  12/26/2015  CLINICAL DATA:  Near syncope, cough EXAM: PORTABLE CHEST 1 VIEW COMPARISON:  12/20/2012 FINDINGS: Cardiomegaly again noted. Large hiatal hernia again noted. The hazy bilateral basilar atelectasis or infiltrate. No pulmonary edema. IMPRESSION: Cardiomegaly. Large hiatal hernia. Hazy bilateral basilar atelectasis or infiltrate. No convincing pulmonary edema. Electronically Signed   By: Lahoma Crocker M.D.   On: 12/26/2015 13:55    Assessment/Plan Principal Problem:    Syncope Active Problems:   MENTAL RETARDATION   GERD (gastroesophageal reflux disease)   Atrial fibrillation (HCC)   Chronic anticoagulation   Chronic kidney disease, stage 3    Syncopal event -Etiology remains unclear at this time although given her history this most likely represents some type of arrhythmia. -We'll admit as observation to a telemetry unit, we'll cycle troponins. Do not believe CT scan of the head is needed as patient was lying on couch when event happened and did not fall or hit her head. She does not have any focal abnormalities that make her suspicious for CVA. -We'll check orthostatic vital signs. -We'll check 2-D echo to assess not only systolic and diastolic function but also to assess for any valvular abnormalities, particularly aortic stenosis, which could account for sudden syncope.  Atrial fibrillation -Rate controlled, currently supratherapeutic. -We will ask pharmacy to assist with Coumadin dosing.  Chronic kidney disease stage III -Creatinine is at baseline  GERD -Continue PPI daily.  DVT prophylaxis -On full dose anticoagulation  CODE STATUS -Full code   Time Spent on Admission: 75 minutes  HERNANDEZ ACOSTA,ESTELA [facility] Pager: [phone] 12/26/2015, 6:19 PM

## 2015-12-26 NOTE — Progress Notes (Signed)
ANTICOAGULATION CONSULT NOTE - Initial Consult  Pharmacy Consult for Warfarin Indication: atrial fibrillation  No Known Allergies  Patient Measurements:    Vital Signs: Temp: 97.4 F (36.3 C) (02/23 1249) Temp Source: Oral (02/23 1249) BP: 124/94 mmHg (02/23 1800) Pulse Rate: 83 (02/23 1800)  Labs:  Recent Labs  12/26/15 1346  HGB 11.5*  HCT 35.3*  PLT 248  LABPROT 31.5*  INR 3.12*  CREATININE 1.27*  TROPONINI <0.03    Estimated Creatinine Clearance: 45.3 mL/min (by C-G formula based on Cr of 1.27).   Medical History: Past Medical History  Diagnosis Date  . Atrial fibrillation (HCC)     EF of 45%  . Premature ventricular contractions   . Hyperlipidemia     Recent lipid profile is excellent without lipid-lowering therapy  . Degenerative joint disease     status post right total hip arthroplasty  . Mental retardation   . Glaucoma   . GERD (gastroesophageal reflux disease)     -Barrett's esophagus  . Gout   . Adenomatous polyps     -resected via colonoscopy in 2011  . Anemia   . Glaucoma   . Hypertension   . Shortness of breath dyspnea   . Dysrhythmia   . Anxiety     MR    Medications:  Prescriptions prior to admission  Medication Sig Dispense Refill Last Dose  . allopurinol (ZYLOPRIM) 100 MG tablet Take 100 mg by mouth daily.     12/25/2015 at Unknown time  . Brinzolamide-Brimonidine (SIMBRINZA) 1-0.2 % SUSP Place 1 drop into both eyes 2 (two) times daily.    12/26/2015 at Unknown time  . Cholecalciferol (VITAMIN D3) 5000 units CAPS Take 1 capsule by mouth daily.    12/26/2015 at Unknown time  . esomeprazole (NEXIUM) 40 MG capsule TAKE ONE CAPSULE BY MOUTH TWO TIMES DAILY 60 capsule 5 12/26/2015 at Unknown time  . ferrous gluconate (FERGON) 324 MG tablet TAKE 1 TABLET BY MOUTH TWICE DAILY WITH MEALS 60 tablet 0 12/26/2015 at Unknown time  . furosemide (LASIX) 40 MG tablet Take daily as needed for leg swelling (Patient taking differently: Take 20 mg by  mouth daily as needed for fluid. Take daily as needed for leg swelling) 90 tablet 3 12/26/2015 at Unknown time  . latanoprost (XALATAN) 0.005 % ophthalmic solution Place 1 drop into both eyes at bedtime.     12/25/2015 at Unknown time  . lisinopril (PRINIVIL,ZESTRIL) 20 MG tablet Take 1 tablet (20 mg total) by mouth daily. (Patient taking differently: Take 20 mg by mouth 2 (two) times daily. ) 30 tablet 6 12/26/2015 at Unknown time  . loratadine (CLARITIN) 10 MG tablet Take 10 mg by mouth as needed for allergies.   unknown  . meclizine (ANTIVERT) 25 MG tablet Take 0.5 tablets by mouth 2 (two) times daily as needed.  0 12/26/2015 at Unknown time  . metoprolol (TOPROL-XL) 200 MG 24 hr tablet TAKE 1 TABLET BY MOUTH DAILY 90 tablet 3 12/26/2015 at 0800  . neomycin-polymyxin-hydrocortisone (CORTISPORIN) 3.5-10000-1 otic suspension Place 2 drops into both ears 4 (four) times daily.    12/25/2015 at Unknown time  . traMADol (ULTRAM) 50 MG tablet Take 1 tablet (50 mg total) by mouth every 4 (four) hours as needed. For pain 90 tablet 0 unknown  . warfarin (COUMADIN) 3 MG tablet Take 3 mg by mouth daily. Take one tablet (3mg ) by mouth daily   12/26/2015 at 0800    Assessment: Okay for Protocol, INR above goal.  Home dose already received today per med list.  Goal of Therapy:  INR 2-3   Plan:  No additional warfarin today. Daily PT/INR. Monitor for signs and symptoms of bleeding.   Pricilla Larsson 12/26/2015,6:55 PM

## 2015-12-26 NOTE — ED Notes (Signed)
Patient with near syncopal episode today at home. CBG 163. Alert/oriented at baseline. Denies pain at present.

## 2015-12-26 NOTE — ED Provider Notes (Signed)
CSN: [id number]     Arrival date & time 12/26/15  1241 History   First MD Initiated Contact with Patient 12/26/15 1307     Chief Complaint  Patient presents with  . Near Syncope     (Consider location/radiation/quality/duration/timing/severity/associated sxs/prior Treatment) HPI Level5 caveat patient mentally retarded. History is obtained from patient's niece who accompanies her. Patient's niece reports that approximately 11 AM today patient reported a vague discomfort in her anterior chest. She merely subsequently suffered a syncopal event lasting 5 or 6 seconds, resolved spontaneously. She is presently asymptomatic. No treatment prior to coming here. Patient denies any shortness of breath. No pain presently. Past Medical History  Diagnosis Date  . Atrial fibrillation (HCC)     EF of 45%  . Premature ventricular contractions   . Hyperlipidemia     Recent lipid profile is excellent without lipid-lowering therapy  . Degenerative joint disease     status post right total hip arthroplasty  . Mental retardation   . Glaucoma   . GERD (gastroesophageal reflux disease)     -Barrett's esophagus  . Gout   . Adenomatous polyps     -resected via colonoscopy in 2011  . Anemia   . Glaucoma   . Hypertension   . Shortness of breath dyspnea   . Dysrhythmia   . Anxiety     MR   Past Surgical History  Procedure Laterality Date  . Total hip arthroplasty  2006    Right  . Cataract extraction  2006    left eye  . Colonoscopy w/ polypectomy  2011  . Eye surgery    . Joint replacement      x2  . Radiology with anesthesia Right 11/21/2015    Procedure: MRI OF RIGHT KNEE WITHOUT CONTAST    (RADIOLOGY WITH ANESTHESIA);  Surgeon: Medication Radiologist, MD;  Location: [place];  Service: Radiology;  Laterality: Right;   No family history on file. Social History  Substance Use Topics  . Smoking status: Never Smoker   . Smokeless tobacco: Never Used  . Alcohol Use: No   OB History    No data  available    Ubable to perform complete ROS, pt mentally retarded Review of Systems  Unable to perform ROS: Other  Cardiovascular: Positive for chest pain.      Allergies  Review of patient's allergies indicates no known allergies.  Home Medications   Prior to Admission medications   Medication Sig Start Date End Date Taking? Authorizing Provider  allopurinol (ZYLOPRIM) 100 MG tablet Take 100 mg by mouth daily.      Historical Provider, MD  Brinzolamide-Brimonidine [facility]) 1-0.2 % SUSP Place 1 drop into both eyes 2 (two) times daily.     Historical Provider, MD  Cholecalciferol (VITAMIN D3) 5000 units CAPS Take 1 capsule by mouth daily.     Historical Provider, MD  esomeprazole (NEXIUM) 40 MG capsule TAKE ONE CAPSULE BY MOUTH TWO TIMES DAILY 03/22/15   Butch Penny, NP  ferrous gluconate (FERGON) 324 MG tablet TAKE 1 TABLET BY MOUTH TWICE DAILY WITH MEALS 08/20/14   Herminio Commons, MD  furosemide (LASIX) 40 MG tablet Take daily as needed for leg swelling 12/05/15   Herminio Commons, MD  latanoprost (XALATAN) 0.005 % ophthalmic solution Place 1 drop into both eyes at bedtime.      Historical Provider, MD  lisinopril (PRINIVIL,ZESTRIL) 20 MG tablet Take 1 tablet (20 mg total) by mouth daily. 12/11/15   Herminio Commons,  MD  loratadine (CLARITIN) 10 MG tablet Take 10 mg by mouth as needed for allergies.    Historical Provider, MD  metoprolol (TOPROL-XL) 200 MG 24 hr tablet TAKE 1 TABLET BY MOUTH DAILY 04/17/15   Herminio Commons, MD  neomycin-polymyxin-hydrocortisone (CORTISPORIN) 3.5-10000-1 otic suspension Place into both ears 4 (four) times daily.    Historical Provider, MD  traMADol (ULTRAM) 50 MG tablet Take 1 tablet (50 mg total) by mouth every 4 (four) hours as needed. For pain 11/26/15   Carole Civil, MD  warfarin (COUMADIN) 3 MG tablet Take 3 mg by mouth daily. Take one tablet (3mg ) by mouth daily    Historical Provider, MD   BP 151/82 mmHg  Pulse 94   Temp(Src) 97.4 F (36.3 C) (Oral)  Resp 17  SpO2 100% Physical Exam  Constitutional: She appears well-developed and well-nourished. No distress.  HENT:  Head: Normocephalic and atraumatic.  Eyes: Conjunctivae are normal. Pupils are equal, round, and reactive to light.  Neck: Neck supple. No tracheal deviation present. No thyromegaly present.  Cardiovascular: Normal rate.   No murmur heard. Irregularly irregular  Pulmonary/Chest: Effort normal and breath sounds normal.  Abdominal: Soft. Bowel sounds are normal. She exhibits no distension. There is no tenderness.  Musculoskeletal: Normal range of motion. She exhibits no edema or tenderness.  Neurological: She is alert. Coordination normal.  Skin: Skin is warm and dry. No rash noted.  Psychiatric: She has a normal mood and affect.  Nursing note and vitals reviewed.   ED Course  Procedures (including critical care time) Labs Review Labs Reviewed - No data to display  Imaging Review No results found. I have personally reviewed and evaluated these images and lab results as part of my medical decision-making.   EKG Interpretation   Date/Time:  Thursday December 26 2015 12:53:30 EST Ventricular Rate:  92 PR Interval:    QRS Duration: 100 QT Interval:  368 QTC Calculation: 455 R Axis:   122 Text Interpretation:  Atrial fibrillation Right axis deviation Low  voltage, precordial leads Abnormal lateral Q waves Borderline T  abnormalities, inferior leads No significant change since last tracing  Confirmed by Winfred Leeds  MD, Leman Martinek [phone]) on 12/26/2015 1:32:17 PM     Chest x-ray viewed by me Results for orders placed or performed during the hospital encounter of 12/26/15  CBC with Differential/Platelet  Result Value Ref Range   WBC 6.6 4.0 - 10.5 K/uL   RBC 3.63 (L) 3.87 - 5.11 MIL/uL   Hemoglobin 11.5 (L) 12.0 - 15.0 g/dL   HCT 35.3 (L) 36.0 - 46.0 %   MCV 97.2 78.0 - 100.0 fL   MCH 31.7 26.0 - 34.0 pg   MCHC 32.6 30.0 - 36.0  g/dL   RDW 16.2 (H) 11.5 - 15.5 %   Platelets 248 150 - 400 K/uL   Neutrophils Relative % 75 %   Neutro Abs 4.9 1.7 - 7.7 K/uL   Lymphocytes Relative 15 %   Lymphs Abs 1.0 0.7 - 4.0 K/uL   Monocytes Relative 7 %   Monocytes Absolute 0.5 0.1 - 1.0 K/uL   Eosinophils Relative 3 %   Eosinophils Absolute 0.2 0.0 - 0.7 K/uL   Basophils Relative 0 %   Basophils Absolute 0.0 0.0 - 0.1 K/uL  Basic metabolic panel  Result Value Ref Range   Sodium 138 135 - 145 mmol/L   Potassium 3.6 3.5 - 5.1 mmol/L   Chloride 103 101 - 111 mmol/L   CO2 27  22 - 32 mmol/L   Glucose, Bld 91 65 - 99 mg/dL   BUN 10 6 - 20 mg/dL   Creatinine, Ser 1.27 (H) 0.44 - 1.00 mg/dL   Calcium 8.6 (L) 8.9 - 10.3 mg/dL   GFR calc non Af Amer 41 (L) >60 mL/min   GFR calc Af Amer 47 (L) >60 mL/min   Anion gap 8 5 - 15  Troponin I  Result Value Ref Range   Troponin I <0.03 <0.031 ng/mL  Protime-INR  Result Value Ref Range   Prothrombin Time 31.5 (H) 11.6 - 15.2 seconds   INR 3.12 (H) 0.00 - 1.49   Dg Chest Port 1 View  12/26/2015  CLINICAL DATA:  Near syncope, cough EXAM: PORTABLE CHEST 1 VIEW COMPARISON:  12/20/2012 FINDINGS: Cardiomegaly again noted. Large hiatal hernia again noted. The hazy bilateral basilar atelectasis or infiltrate. No pulmonary edema. IMPRESSION: Cardiomegaly. Large hiatal hernia. Hazy bilateral basilar atelectasis or infiltrate. No convincing pulmonary edema. Electronically Signed   By: Lahoma Crocker M.D.   On: 12/26/2015 13:55    MDM  Concern for dysrhythmia as cause of syncope. Final diagnoses:  None   Pt with mild anemia,esbntially unchanged from 10 days ago Dr Jerilee Hoh  Consulted . willl see pt in hospital Plan  23 hour observation to telemtry Dx #1 syncope #2 anemia    Orlie Dakin, MD 12/26/15 [phone]

## 2015-12-26 NOTE — ED Notes (Signed)
Patient with diarrhea. Per Caregiver, new onset today. 3 loose stools in ED. Will collect sample for c-diff protocol when specimen available.

## 2015-12-27 ENCOUNTER — Observation Stay (HOSPITAL_COMMUNITY): Payer: Medicare Other

## 2015-12-27 DIAGNOSIS — R55 Syncope and collapse: Secondary | ICD-10-CM | POA: Diagnosis not present

## 2015-12-27 LAB — BASIC METABOLIC PANEL
ANION GAP: 9 (ref 5–15)
BUN: 8 mg/dL (ref 6–20)
CALCIUM: 8.8 mg/dL — AB (ref 8.9–10.3)
CO2: 28 mmol/L (ref 22–32)
CREATININE: 1.21 mg/dL — AB (ref 0.44–1.00)
Chloride: 104 mmol/L (ref 101–111)
GFR calc non Af Amer: 43 mL/min — ABNORMAL LOW (ref >60)
GFR, EST AFRICAN AMERICAN: 50 mL/min — AB (ref >60)
GLUCOSE: 103 mg/dL — AB (ref 65–99)
Potassium: 3.7 mmol/L (ref 3.5–5.1)
Sodium: 141 mmol/L (ref 135–145)

## 2015-12-27 LAB — PROTIME-INR
INR: 2.91 — ABNORMAL HIGH (ref 0.00–1.49)
PROTHROMBIN TIME: 29.9 s — AB (ref 11.6–15.2)

## 2015-12-27 LAB — CBC
HCT: 34.4 % — ABNORMAL LOW (ref 36.0–46.0)
Hemoglobin: 11.3 g/dL — ABNORMAL LOW (ref 12.0–15.0)
MCH: 31.9 pg (ref 26.0–34.0)
MCHC: 32.8 g/dL (ref 30.0–36.0)
MCV: 97.2 fL (ref 78.0–100.0)
PLATELETS: 248 10*3/uL (ref 150–400)
RBC: 3.54 MIL/uL — AB (ref 3.87–5.11)
RDW: 16.2 % — ABNORMAL HIGH (ref 11.5–15.5)
WBC: 6.5 10*3/uL (ref 4.0–10.5)

## 2015-12-27 LAB — TROPONIN I

## 2015-12-27 MED ORDER — WARFARIN SODIUM 2 MG PO TABS: 2.0000 mg | ORAL_TABLET | Freq: Once | ORAL | Status: DC

## 2015-12-27 NOTE — Progress Notes (Signed)
ANTICOAGULATION CONSULT NOTE - follow up  Pharmacy Consult for Warfarin Indication: atrial fibrillation  No Known Allergies  Patient Measurements:    Vital Signs: Temp: 97.5 F (36.4 C) (02/24 0448) Temp Source: Oral (02/24 0448) BP: 128/84 mmHg (02/24 0448) Pulse Rate: 76 (02/24 0448)  Labs:  Recent Labs  12/26/15 1346 12/26/15 1926 12/27/15 0033 12/27/15 0636  HGB 11.5*  --   --  11.3*  HCT 35.3*  --   --  34.4*  PLT 248  --   --  248  LABPROT 31.5*  --   --  29.9*  INR 3.12*  --   --  2.91*  CREATININE 1.27*  --   --  1.21*  TROPONINI <0.03 <0.03 <0.03 <0.03   Medical History: Past Medical History  Diagnosis Date  . Atrial fibrillation (HCC)     EF of 45%  . Premature ventricular contractions   . Hyperlipidemia     Recent lipid profile is excellent without lipid-lowering therapy  . Degenerative joint disease     status post right total hip arthroplasty  . Mental retardation   . Glaucoma   . GERD (gastroesophageal reflux disease)     -Barrett's esophagus  . Gout   . Adenomatous polyps     -resected via colonoscopy in 2011  . Anemia   . Glaucoma   . Hypertension   . Shortness of breath dyspnea   . Dysrhythmia   . Anxiety     MR   Medications:  Prescriptions prior to admission  Medication Sig Dispense Refill Last Dose  . allopurinol (ZYLOPRIM) 100 MG tablet Take 100 mg by mouth daily.     12/25/2015 at Unknown time  . Brinzolamide-Brimonidine (SIMBRINZA) 1-0.2 % SUSP Place 1 drop into both eyes 2 (two) times daily.    12/26/2015 at Unknown time  . Cholecalciferol (VITAMIN D3) 5000 units CAPS Take 1 capsule by mouth daily.    12/26/2015 at Unknown time  . esomeprazole (NEXIUM) 40 MG capsule TAKE ONE CAPSULE BY MOUTH TWO TIMES DAILY 60 capsule 5 12/26/2015 at Unknown time  . ferrous gluconate (FERGON) 324 MG tablet TAKE 1 TABLET BY MOUTH TWICE DAILY WITH MEALS 60 tablet 0 12/26/2015 at Unknown time  . furosemide (LASIX) 40 MG tablet Take daily as needed for  leg swelling (Patient taking differently: Take 20 mg by mouth daily as needed for fluid. Take daily as needed for leg swelling) 90 tablet 3 12/26/2015 at Unknown time  . latanoprost (XALATAN) 0.005 % ophthalmic solution Place 1 drop into both eyes at bedtime.     12/25/2015 at Unknown time  . lisinopril (PRINIVIL,ZESTRIL) 20 MG tablet Take 1 tablet (20 mg total) by mouth daily. (Patient taking differently: Take 20 mg by mouth 2 (two) times daily. ) 30 tablet 6 12/26/2015 at Unknown time  . loratadine (CLARITIN) 10 MG tablet Take 10 mg by mouth as needed for allergies.   unknown  . meclizine (ANTIVERT) 25 MG tablet Take 0.5 tablets by mouth 2 (two) times daily as needed.  0 12/26/2015 at Unknown time  . metoprolol (TOPROL-XL) 200 MG 24 hr tablet TAKE 1 TABLET BY MOUTH DAILY 90 tablet 3 12/26/2015 at 0800  . neomycin-polymyxin-hydrocortisone (CORTISPORIN) 3.5-10000-1 otic suspension Place 2 drops into both ears 4 (four) times daily.    12/25/2015 at Unknown time  . traMADol (ULTRAM) 50 MG tablet Take 1 tablet (50 mg total) by mouth every 4 (four) hours as needed. For pain 90 tablet 0 unknown  .  warfarin (COUMADIN) 3 MG tablet Take 3 mg by mouth daily. Take one tablet (3mg ) by mouth daily   12/26/2015 at 0800   Assessment: Okay for Protocol, INR above goal on admission but now therapeutic on high end of range.   Goal of Therapy:  INR 2-3   Plan:  Coumadin 2mg  today (dose reduction to discourage overshoot / INR rise) Daily PT/INR. Monitor for signs and symptoms of bleeding.   Hart Robinsons A 12/27/2015,12:35 PM

## 2015-12-27 NOTE — Discharge Summary (Signed)
Physician Discharge Summary  [name] [id number] DOB: 18-Oct-1941 DOA: 12/26/2015  PCP: Doree Albee, MD  Admit date: 12/26/2015 Discharge date: 12/27/2015  Time spent: 45 minutes  Recommendations for Outpatient Follow-up:  -We'll be discharged home today. -Advised to follow-up with primary care provider in 2 weeks. -Patient needs a 30 day event monitor for continued workup of her syncope. I did discuss with cardiology office and they will mail the monitor to her house.   Discharge Diagnoses:  Principal Problem:   Syncope Active Problems:   MENTAL RETARDATION   GERD (gastroesophageal reflux disease)   Atrial fibrillation (HCC)   Chronic anticoagulation   Chronic kidney disease, stage 3   Discharge Condition: Stable and improved  There were no vitals filed for this visit.  History of present illness:  Patient is a 75 year old woman with multiple medical comorbidities including atrial fibrillation maintained on chronic anticoagulation with Coumadin, hypertension, mental retardation, hyperlipidemia among other things who presents to the hospital today with a syncopal event. History is given by patient's nurse and caregiver [name]. She tells me that patient was lying on the couch and they were talking when all of a sudden she simply became unresponsive. Her eyes closed her head went to the side. This episode lasted about 20 seconds. There was no twitching movement of her extremities, no biting of the tongue, no loss of bowel or bladder continence. After 20 seconds she open her eyes and was back to her normal although she does not have any recollection of this event. EMS was called and they brought her into the hospital for evaluation. In the ED she was found to be in atrial fibrillation at a controlled rate, lab work is essentially unremarkable. No evidence of ongoing infection, she is afebrile, no leukocytosis. We have been asked to admit her for observation and further  management.   Hospital Course:   Syncopal event -Etiology remains unclear at time of discharge. -Echo pending at time of discharge. -Event monitor will be arranged to further evaluate for potential arrhythmias.  Rest of chronic medical conditions have been stable and home medications have not been altered.  Procedures:  None   Consultations:  None  Discharge Instructions  Discharge Instructions    Diet - low sodium heart healthy    Complete by:  As directed      Increase activity slowly    Complete by:  As directed             Medication List    STOP taking these medications        furosemide 40 MG tablet  Commonly known as:  LASIX      TAKE these medications        allopurinol 100 MG tablet  Commonly known as:  ZYLOPRIM  Take 100 mg by mouth daily.     esomeprazole 40 MG capsule  Commonly known as:  NEXIUM  TAKE ONE CAPSULE BY MOUTH TWO TIMES DAILY     ferrous gluconate 324 MG tablet  Commonly known as:  FERGON  TAKE 1 TABLET BY MOUTH TWICE DAILY WITH MEALS     lisinopril 20 MG tablet  Commonly known as:  PRINIVIL,ZESTRIL  Take 1 tablet (20 mg total) by mouth daily.     loratadine 10 MG tablet  Commonly known as:  CLARITIN  Take 10 mg by mouth as needed for allergies.     meclizine 25 MG tablet  Commonly known as:  ANTIVERT  Take 0.5 tablets  by mouth 2 (two) times daily as needed.     metoprolol 200 MG 24 hr tablet  Commonly known as:  TOPROL-XL  TAKE 1 TABLET BY MOUTH DAILY     neomycin-polymyxin-hydrocortisone 3.5-10000-1 otic suspension  Commonly known as:  CORTISPORIN  Place 2 drops into both ears 4 (four) times daily.     SIMBRINZA 1-0.2 % Susp  Generic drug:  Brinzolamide-Brimonidine  Place 1 drop into both eyes 2 (two) times daily.     traMADol 50 MG tablet  Commonly known as:  ULTRAM  Take 1 tablet (50 mg total) by mouth every 4 (four) hours as needed. For pain     Vitamin D3 5000 units Caps  Take 1 capsule by mouth daily.       warfarin 3 MG tablet  Commonly known as:  COUMADIN  Take 3 mg by mouth daily. Take one tablet (3mg ) by mouth daily     XALATAN 0.005 % ophthalmic solution  Generic drug:  latanoprost  Place 1 drop into both eyes at bedtime.       No Known Allergies     Follow-up Information    Follow up with Doree Albee, MD. Schedule an appointment as soon as possible for a visit in 2 weeks.   Specialty:  Internal Medicine   Contact information:   [place] [phone]        The results of significant diagnostics from this hospitalization (including imaging, microbiology, ancillary and laboratory) are listed below for reference.    Significant Diagnostic Studies: Dg Chest Port 1 View  12/26/2015  CLINICAL DATA:  Near syncope, cough EXAM: PORTABLE CHEST 1 VIEW COMPARISON:  12/20/2012 FINDINGS: Cardiomegaly again noted. Large hiatal hernia again noted. The hazy bilateral basilar atelectasis or infiltrate. No pulmonary edema. IMPRESSION: Cardiomegaly. Large hiatal hernia. Hazy bilateral basilar atelectasis or infiltrate. No convincing pulmonary edema. Electronically Signed   By: Lahoma Crocker M.D.   On: 12/26/2015 13:55    Microbiology: Recent Results (from the past 240 hour(s))  C difficile quick scan w PCR reflex     Status: None   Collection Time: 12/26/15  5:17 PM  Result Value Ref Range Status   C Diff antigen NEGATIVE NEGATIVE Final   C Diff toxin NEGATIVE NEGATIVE Final   C Diff interpretation Negative for toxigenic C. difficile  Final     Labs: Basic Metabolic Panel:  Recent Labs Lab 12/26/15 1346 12/27/15 0636  NA 138 141  K 3.6 3.7  CL 103 104  CO2 27 28  GLUCOSE 91 103*  BUN 10 8  CREATININE 1.27* 1.21*  CALCIUM 8.6* 8.8*   Liver Function Tests: No results for input(s): AST, ALT, ALKPHOS, BILITOT, PROT, ALBUMIN in the last 168 hours. No results for input(s): LIPASE, AMYLASE in the last 168 hours. No results for input(s): AMMONIA in  the last 168 hours. CBC:  Recent Labs Lab 12/26/15 1346 12/27/15 0636  WBC 6.6 6.5  NEUTROABS 4.9  --   HGB 11.5* 11.3*  HCT 35.3* 34.4*  MCV 97.2 97.2  PLT 248 248   Cardiac Enzymes:  Recent Labs Lab 12/26/15 1346 12/26/15 1926 12/27/15 0033 12/27/15 0636  TROPONINI <0.03 <0.03 <0.03 <0.03   BNP: BNP (last 3 results) No results for input(s): BNP in the last 8760 hours.  ProBNP (last 3 results) No results for input(s): PROBNP in the last 8760 hours.  CBG: No results for input(s): GLUCAP in the last 168 hours.  SignedLelon Frohlich  [facility] Pager: [phone] 12/27/2015, 3:00 PM

## 2015-12-27 NOTE — Progress Notes (Signed)
Patient discharged with instructions.  Verbalized understanding via teach back.  IV was removed and the site was WNL. Patient voiced no further complaints or concerns at the time of discharge.  Appointments scheduled per instructions.  Patient left the floor via w/c with staff and family in stable condition.

## 2015-12-27 NOTE — Care Management Obs Status (Signed)
[place] NOTIFICATION   Patient Details  Name: [name] MRN: [id number] Date of Birth: 02-21-1941   Medicare Observation Status Notification Given:  Yes    Alvie Heidelberg, RN 12/27/2015, 8:49 AM

## 2015-12-27 NOTE — Care Management Note (Signed)
Case Management Note  Patient Details  Name: [name] MRN: [id number] Date of Birth: 09/07/1941  Subjective/Objective:       Spoke with patient and niece [name] who is the caregiver ans also reports herself to be a nurses aid, Patient stated that she wants to go home. Niece answers questions. [name] stated that the patient lives independent next to her in a trailer that has handicapped accessible amenities. Shower chair, walker.  Denies andy needs ans stated that she is a member of CAP program.   No CM needs identified.             Action/Plan: Home with care of family, CAP program.   Expected Discharge Date:  12/28/15               Expected Discharge Plan:  Home/Self Care  In-House Referral:     Discharge planning Services  CM Consult  Post Acute Care Choice:    Choice offered to:     DME Arranged:    DME Agency:     HH Arranged:    [place] Agency:     Status of Service:  Completed, signed off  Medicare Important Message Given:    Date Medicare IM Given:    Medicare IM give by:    Date Additional Medicare IM Given:    Additional Medicare Important Message give by:     If discussed at [place] of Stay Meetings, dates discussed:    Additional Comments:  Alvie Heidelberg, RN 12/27/2015, 8:43 AM

## 2016-01-01 ENCOUNTER — Ambulatory Visit (HOSPITAL_COMMUNITY)
Admission: RE | Admit: 2016-01-01 | Discharge: 2016-01-01 | Disposition: A | Discharge status: Home / Self Care | Payer: Medicare Other | Source: Ambulatory Visit | Attending: Internal Medicine | Admitting: Internal Medicine

## 2016-01-01 ENCOUNTER — Encounter (HOSPITAL_COMMUNITY)
Admission: RE | Disposition: A | Discharge status: Home / Self Care | Payer: Self-pay | Source: Ambulatory Visit | Attending: Internal Medicine

## 2016-01-01 ENCOUNTER — Encounter (HOSPITAL_COMMUNITY): Payer: Self-pay | Admitting: *Deleted

## 2016-01-01 DIAGNOSIS — K219 Gastro-esophageal reflux disease without esophagitis: Secondary | ICD-10-CM | POA: Diagnosis not present

## 2016-01-01 DIAGNOSIS — E785 Hyperlipidemia, unspecified: Secondary | ICD-10-CM | POA: Insufficient documentation

## 2016-01-01 DIAGNOSIS — I4891 Unspecified atrial fibrillation: Secondary | ICD-10-CM | POA: Insufficient documentation

## 2016-01-01 DIAGNOSIS — K2270 Barrett's esophagus without dysplasia: Secondary | ICD-10-CM | POA: Diagnosis not present

## 2016-01-01 DIAGNOSIS — I10 Essential (primary) hypertension: Secondary | ICD-10-CM | POA: Diagnosis not present

## 2016-01-01 DIAGNOSIS — F419 Anxiety disorder, unspecified: Secondary | ICD-10-CM | POA: Insufficient documentation

## 2016-01-01 DIAGNOSIS — F79 Unspecified intellectual disabilities: Secondary | ICD-10-CM | POA: Diagnosis not present

## 2016-01-01 DIAGNOSIS — K449 Diaphragmatic hernia without obstruction or gangrene: Secondary | ICD-10-CM | POA: Diagnosis not present

## 2016-01-01 DIAGNOSIS — Z96641 Presence of right artificial hip joint: Secondary | ICD-10-CM | POA: Diagnosis not present

## 2016-01-01 DIAGNOSIS — Z96651 Presence of right artificial knee joint: Secondary | ICD-10-CM | POA: Insufficient documentation

## 2016-01-01 DIAGNOSIS — Z7901 Long term (current) use of anticoagulants: Secondary | ICD-10-CM | POA: Diagnosis not present

## 2016-01-01 DIAGNOSIS — Z79899 Other long term (current) drug therapy: Secondary | ICD-10-CM | POA: Insufficient documentation

## 2016-01-01 HISTORY — PX: ESOPHAGOGASTRODUODENOSCOPY: SHX5428

## 2016-01-01 SURGERY — EGD (ESOPHAGOGASTRODUODENOSCOPY)
Anesthesia: Moderate Sedation

## 2016-01-01 MED ORDER — MIDAZOLAM HCL 5 MG/5ML IJ SOLN
INTRAMUSCULAR | Status: AC
  Filled 2016-01-01: qty 10

## 2016-01-01 MED ORDER — SODIUM CHLORIDE 0.9 % IV SOLN
INTRAVENOUS | Status: DC
  Administered 2016-01-01: 13:00:00 via INTRAVENOUS

## 2016-01-01 MED ORDER — BUTAMBEN-TETRACAINE-BENZOCAINE 2-2-14 % EX AERO
INHALATION_SPRAY | CUTANEOUS | Status: DC | PRN
  Administered 2016-01-01: 2 via TOPICAL

## 2016-01-01 MED ORDER — STERILE WATER FOR IRRIGATION IR SOLN
Status: DC | PRN
  Administered 2016-01-01: 15:00:00

## 2016-01-01 MED ORDER — MEPERIDINE HCL 50 MG/ML IJ SOLN
INTRAMUSCULAR | Status: DC | PRN
  Administered 2016-01-01 (×2): 25 mg via INTRAVENOUS

## 2016-01-01 MED ORDER — MIDAZOLAM HCL 5 MG/5ML IJ SOLN
INTRAMUSCULAR | Status: DC | PRN
  Administered 2016-01-01: 2 mg via INTRAVENOUS
  Administered 2016-01-01: 1 mg via INTRAVENOUS
  Administered 2016-01-01: 2 mg via INTRAVENOUS

## 2016-01-01 MED ORDER — MEPERIDINE HCL 50 MG/ML IJ SOLN
INTRAMUSCULAR | Status: AC
  Filled 2016-01-01: qty 1

## 2016-01-01 NOTE — H&P (Addendum)
[name] is an 75 y.o. female.   Chief Complaint: Patient is here for EGD. HPI: should a 75 year old Caucasian female was chronic GERD complicated by long segment Barrett's esophagus who is here for surveillance EGD. Last exam was about 5 years ago. She says heartburns well controlled with therapy. She denies dysphagia nausea vomiting abdominal pain melena or rectal bleeding. Last dose of warfarin was 4 days ago.  Past Medical History  Diagnosis Date  . Atrial fibrillation (HCC)     EF of 45%  . Premature ventricular contractions   . Hyperlipidemia     Recent lipid profile is excellent without lipid-lowering therapy  . Degenerative joint disease     status post right total hip arthroplasty  . Mental retardation   . Glaucoma   . GERD (gastroesophageal reflux disease)     -Barrett's esophagus  . Gout   . Adenomatous polyps     -resected via colonoscopy in 2011  . Anemia   . Glaucoma   . Hypertension   . Shortness of breath dyspnea   . Dysrhythmia   . Anxiety     MR    Past Surgical History  Procedure Laterality Date  . Total hip arthroplasty  2006    Right  . Cataract extraction  2006    left eye  . Colonoscopy w/ polypectomy  2011  . Eye surgery    . Joint replacement      x2  . Radiology with anesthesia Right 11/21/2015    Procedure: MRI OF RIGHT KNEE WITHOUT CONTAST    (RADIOLOGY WITH ANESTHESIA);  Surgeon: Medication Radiologist, MD;  Location: [facility];  Service: Radiology;  Laterality: Right;    History reviewed. No pertinent family history. Social History:  reports that she has never smoked. She has never used smokeless tobacco. She reports that she does not drink alcohol or use illicit drugs.  Allergies: No Known Allergies  Medications Prior to Admission  Medication Sig Dispense Refill  . allopurinol (ZYLOPRIM) 100 MG tablet Take 100 mg by mouth daily.      . Brinzolamide-Brimonidine (SIMBRINZA) 1-0.2 % SUSP Place 1 drop into both eyes 2 (two) times daily.      . Cholecalciferol (VITAMIN D3) 5000 units CAPS Take 1 capsule by mouth daily.     Marland Kitchen esomeprazole (NEXIUM) 40 MG capsule TAKE ONE CAPSULE BY MOUTH TWO TIMES DAILY 60 capsule 5  . ferrous gluconate (FERGON) 324 MG tablet TAKE 1 TABLET BY MOUTH TWICE DAILY WITH MEALS 60 tablet 0  . latanoprost (XALATAN) 0.005 % ophthalmic solution Place 1 drop into both eyes at bedtime.      Marland Kitchen lisinopril (PRINIVIL,ZESTRIL) 20 MG tablet Take 1 tablet (20 mg total) by mouth daily. (Patient taking differently: Take 20 mg by mouth 2 (two) times daily. ) 30 tablet 6  . loratadine (CLARITIN) 10 MG tablet Take 10 mg by mouth as needed for allergies.    Marland Kitchen meclizine (ANTIVERT) 25 MG tablet Take 0.5 tablets by mouth 2 (two) times daily as needed.  0  . metoprolol (TOPROL-XL) 200 MG 24 hr tablet TAKE 1 TABLET BY MOUTH DAILY 90 tablet 3  . neomycin-polymyxin-hydrocortisone (CORTISPORIN) 3.5-10000-1 otic suspension Place 2 drops into both ears 4 (four) times daily.     . traMADol (ULTRAM) 50 MG tablet Take 1 tablet (50 mg total) by mouth every 4 (four) hours as needed. For pain 90 tablet 0  . warfarin (COUMADIN) 3 MG tablet Take 3 mg by mouth daily. Take  one tablet (3mg ) by mouth daily      No results found for this or any previous visit (from the past 48 hour(s)). No results found.  ROS  Blood pressure 140/87, pulse 90, temperature 97.9 F (36.6 C), temperature source Oral, resp. rate 16, height 5\' 8"  (1.727 m), weight 190 lb (86.183 kg), SpO2 100 %. Physical Exam  Constitutional: She appears well-developed and well-nourished.  HENT:  Mouth/Throat: Oropharynx is clear and moist.  Eyes: Conjunctivae are normal. No scleral icterus.  Neck: No thyromegaly present.  Cardiovascular:  Irregular rhythm normal S1 and S2. No murmur or gallop noted.  Respiratory: Effort normal and breath sounds normal.  GI: Soft. She exhibits no distension and no mass. There is no tenderness.  Musculoskeletal: She exhibits no edema.   Lymphadenopathy:    She has no cervical adenopathy.  Neurological: She is alert.  Skin: Skin is warm and dry.     Assessment/Plan Long segment Barrett's esophagus. Chronic GERD. Surveillance EGD.  Rogene Houston, MD 01/01/2016, 2:31 PM

## 2016-01-01 NOTE — Op Note (Signed)
EGD PROCEDURE REPORT  PATIENT:  [name]  MR#:  [id number] Birthdate:  04/16/1941, 75 y.o., female Endoscopist:  Dr. Rogene Houston, MD Referred By:  Dr. Doree Albee, MD  Procedure Date: 01/01/2016  Procedure:   EGD  Indications:  Patient is 74 year old Caucasian female was chronic GERD complicated by 4 cm long as esophagus was here for surveillance EGD. Last exam was 5 years ago. Heartburn is well controlled with therapy and she denies dysphagia. Warfarin is on hold. Patient has mental retardation but he has been deemed to be competent to give [place] informed consent.            Informed Consent:  The risks, benefits, alternatives & imponderables which include, but are not limited to, bleeding, infection, perforation, drug reaction and potential missed lesion have been reviewed.  The potential for biopsy, lesion removal, esophageal dilation, etc. have also been discussed.  Questions have been answered.  All parties agreeable.  Please see history & physical in medical record for more information.  Medications:  Demerol 50 mg IV Versed 5 mg IV Cetacaine spray topically for oropharyngeal anesthesia  First dose administered at 1436 Last dose administered at  1444  Description of procedure:  The endoscope was introduced through the mouth and advanced to the second portion of the duodenum without difficulty or limitations. The mucosal surfaces were surveyed very carefully during advancement of the scope and upon withdrawal.  Findings:  Esophagus:  Mucosa of the proximal and middle segment was normal. 4 cm long tubular Barrett's mucosa noted at distal esophagus without erosions or other abnormalities. Proximal margin of Barrett's was at 31 cm from the incisors. GEJ:  35 cm Hiatus:  42 cm.hiatus felt to be wide open and difficult to accurately determine distance from the incisors. Stomach:  Stomach was empty and distended very well with insufflation. Folds in the proximal stomach were  normal. Examination mucosa gastric body, antrum, pyloric channel, nurse fundus and cardia was normal. Duodenum:  Normal bulbar and post bulbar mucosa.  Therapeutic/Diagnostic Maneuvers Performed:   Four-quadrant biopsies taken from distal and proximal segment of Barrett's mucosa and submitted separately.  Complications:  none  EBL: minimal  Impression: 4 cm long tubular Barrett's without ulceration or stricture. Moderate to large sliding hiatal hernia. No evidence of peptic ulcer disease. Four quadrant biopsies taken from distal and proximal segment of Barrett's mucosa.  Recommendations:  Standard instructions given. I will be contacting patient's sister [name] with biopsy results. Office visit in one year.  Jenicka Coxe U  01/01/2016  2:56 PM  CC: Dr. Doree Albee, MD & Dr. Rayne Du ref. provider found

## 2016-01-01 NOTE — Discharge Instructions (Signed)
Resume usual medications including warfarin. Resume usual diet. Get INR checked in 7-10 days Physician will call sister with Biopsy results.  Esophagogastroduodenoscopy, Care After Refer to this sheet in the next few weeks. These instructions provide you with information about caring for yourself after your procedure. Your health care provider may also give you more specific instructions. Your treatment has been planned according to current medical practices, but problems sometimes occur. Call your health care provider if you have any problems or questions after your procedure. WHAT TO EXPECT AFTER THE PROCEDURE After your procedure, it is typical to feel:  Soreness in your throat.  Pain with swallowing.  Sick to your stomach (nauseous).  Bloated.  Dizzy.  Fatigued. HOME CARE INSTRUCTIONS  Do not eat or drink anything until the numbing medicine (local anesthetic) has worn off and your gag reflex has returned. You will know that the local anesthetic has worn off when you can swallow comfortably.  Do not drive or operate machinery until directed by your health care provider.  Take medicines only as directed by your health care provider. SEEK MEDICAL CARE IF:   You cannot stop coughing.  You are not urinating at all or less than usual. SEEK IMMEDIATE MEDICAL CARE IF:  You have difficulty swallowing.  You cannot eat or drink.  You have worsening throat or chest pain.  You have dizziness or lightheadedness or you faint.  You have nausea or vomiting.  You have chills.  You have a fever.  You have severe abdominal pain.  You have black, tarry, or bloody stools.   This information is not intended to replace advice given to you by your health care provider. Make sure you discuss any questions you have with your health care provider.   Document Released: 10/05/2012 Document Revised: 11/09/2014 Document Reviewed: 10/05/2012 Elsevier Interactive Patient Education [employer]

## 2016-01-02 ENCOUNTER — Other Ambulatory Visit: Payer: Self-pay

## 2016-01-02 ENCOUNTER — Ambulatory Visit (INDEPENDENT_AMBULATORY_CARE_PROVIDER_SITE_OTHER): Payer: Medicare Other

## 2016-01-02 DIAGNOSIS — R55 Syncope and collapse: Secondary | ICD-10-CM | POA: Diagnosis not present

## 2016-01-06 ENCOUNTER — Telehealth: Payer: Self-pay | Admitting: Cardiovascular Disease

## 2016-01-06 ENCOUNTER — Encounter (HOSPITAL_COMMUNITY): Payer: Self-pay | Admitting: Internal Medicine

## 2016-01-06 ENCOUNTER — Other Ambulatory Visit: Payer: Self-pay

## 2016-01-06 MED ORDER — LISINOPRIL 20 MG PO TABS: 20.0000 mg | ORAL_TABLET | Freq: Two times a day (BID) | ORAL | Status: DC

## 2016-01-06 NOTE — Telephone Encounter (Signed)
Pt is out of her BP meds due to dosage change, she's taking 2 a day and she's out and they will not refill till a new Rx has been sent in w/ the right dosage on it

## 2016-01-08 ENCOUNTER — Telehealth: Payer: Self-pay | Admitting: Cardiology

## 2016-01-08 DIAGNOSIS — Z961 Presence of intraocular lens: Secondary | ICD-10-CM | POA: Diagnosis not present

## 2016-01-08 DIAGNOSIS — H04129 Dry eye syndrome of unspecified lacrimal gland: Secondary | ICD-10-CM | POA: Diagnosis not present

## 2016-01-08 DIAGNOSIS — H401133 Primary open-angle glaucoma, bilateral, severe stage: Secondary | ICD-10-CM | POA: Diagnosis not present

## 2016-01-08 DIAGNOSIS — Z5181 Encounter for therapeutic drug level monitoring: Secondary | ICD-10-CM | POA: Diagnosis not present

## 2016-01-08 DIAGNOSIS — H43812 Vitreous degeneration, left eye: Secondary | ICD-10-CM | POA: Diagnosis not present

## 2016-01-08 NOTE — Telephone Encounter (Signed)
Spoke to pt son, he stated that she keeps fooling with the monitor and pushing the buttons. She doesn't really understand the monitor so that she may keep calling to ask questions. I told him it would be fine for her to push the button to record anything she feels is a symptom.

## 2016-01-08 NOTE — Telephone Encounter (Signed)
Pt is having trouble w/ her heart monitor

## 2016-01-16 DIAGNOSIS — Z7901 Long term (current) use of anticoagulants: Secondary | ICD-10-CM | POA: Diagnosis not present

## 2016-01-28 DIAGNOSIS — R42 Dizziness and giddiness: Secondary | ICD-10-CM | POA: Diagnosis not present

## 2016-01-28 DIAGNOSIS — N182 Chronic kidney disease, stage 2 (mild): Secondary | ICD-10-CM | POA: Diagnosis not present

## 2016-01-28 DIAGNOSIS — Z5181 Encounter for therapeutic drug level monitoring: Secondary | ICD-10-CM | POA: Diagnosis not present

## 2016-01-31 ENCOUNTER — Telehealth: Payer: Self-pay | Admitting: Cardiovascular Disease

## 2016-01-31 NOTE — Telephone Encounter (Signed)
Ms. Tibbles niece, [name], called stating that the patient's heart monitor would not stay on so they mailed the device back at least a week ago. She then stated that someone called them a few days ago asking them to mail the monitor back. Denise wasn't sure if that was [place] or the monitor company - whoever it was she wants to let them know that they have already mailed it back.  Also, [name] called to make sure that the data that was transmitted was enough to get a good reading since the patient couldn't stand to wear the monitor for the full 30 days.  I told the [name] that we would call her back after looking into these 2 items.   [name] asked that we call [phone] if it's before 2pm today, but if we call after 2pm please call her at [phone].

## 2016-01-31 NOTE — Telephone Encounter (Signed)
I spoke with [name],relayed to her we are awaiting reading of monitor and will call her when resulted.

## 2016-02-18 DIAGNOSIS — N182 Chronic kidney disease, stage 2 (mild): Secondary | ICD-10-CM | POA: Diagnosis not present

## 2016-02-18 DIAGNOSIS — I5032 Chronic diastolic (congestive) heart failure: Secondary | ICD-10-CM | POA: Diagnosis not present

## 2016-02-26 DIAGNOSIS — I4891 Unspecified atrial fibrillation: Secondary | ICD-10-CM | POA: Diagnosis not present

## 2016-02-26 DIAGNOSIS — Z5181 Encounter for therapeutic drug level monitoring: Secondary | ICD-10-CM | POA: Diagnosis not present

## 2016-03-12 ENCOUNTER — Other Ambulatory Visit (INDEPENDENT_AMBULATORY_CARE_PROVIDER_SITE_OTHER): Payer: Self-pay | Admitting: Internal Medicine

## 2016-03-12 DIAGNOSIS — N182 Chronic kidney disease, stage 2 (mild): Secondary | ICD-10-CM | POA: Diagnosis not present

## 2016-03-12 DIAGNOSIS — N390 Urinary tract infection, site not specified: Secondary | ICD-10-CM | POA: Diagnosis not present

## 2016-03-12 DIAGNOSIS — I5032 Chronic diastolic (congestive) heart failure: Secondary | ICD-10-CM | POA: Diagnosis not present

## 2016-03-12 DIAGNOSIS — I10 Essential (primary) hypertension: Secondary | ICD-10-CM | POA: Diagnosis not present

## 2016-03-12 DIAGNOSIS — Z7901 Long term (current) use of anticoagulants: Secondary | ICD-10-CM | POA: Diagnosis not present

## 2016-03-23 DIAGNOSIS — I5032 Chronic diastolic (congestive) heart failure: Secondary | ICD-10-CM | POA: Diagnosis not present

## 2016-03-23 DIAGNOSIS — N390 Urinary tract infection, site not specified: Secondary | ICD-10-CM | POA: Diagnosis not present

## 2016-03-23 DIAGNOSIS — H66009 Acute suppurative otitis media without spontaneous rupture of ear drum, unspecified ear: Secondary | ICD-10-CM | POA: Diagnosis not present

## 2016-03-23 DIAGNOSIS — Z7901 Long term (current) use of anticoagulants: Secondary | ICD-10-CM | POA: Diagnosis not present

## 2016-03-23 DIAGNOSIS — N182 Chronic kidney disease, stage 2 (mild): Secondary | ICD-10-CM | POA: Diagnosis not present

## 2016-04-02 DIAGNOSIS — Z96642 Presence of left artificial hip joint: Secondary | ICD-10-CM | POA: Diagnosis not present

## 2016-04-02 DIAGNOSIS — F71 Moderate intellectual disabilities: Secondary | ICD-10-CM | POA: Diagnosis not present

## 2016-04-02 DIAGNOSIS — M6281 Muscle weakness (generalized): Secondary | ICD-10-CM | POA: Diagnosis not present

## 2016-04-02 DIAGNOSIS — Z96641 Presence of right artificial hip joint: Secondary | ICD-10-CM | POA: Diagnosis not present

## 2016-04-02 DIAGNOSIS — N182 Chronic kidney disease, stage 2 (mild): Secondary | ICD-10-CM | POA: Diagnosis not present

## 2016-04-02 DIAGNOSIS — I130 Hypertensive heart and chronic kidney disease with heart failure and stage 1 through stage 4 chronic kidney disease, or unspecified chronic kidney disease: Secondary | ICD-10-CM | POA: Diagnosis not present

## 2016-04-02 DIAGNOSIS — Z7901 Long term (current) use of anticoagulants: Secondary | ICD-10-CM | POA: Diagnosis not present

## 2016-04-02 DIAGNOSIS — I4891 Unspecified atrial fibrillation: Secondary | ICD-10-CM | POA: Diagnosis not present

## 2016-04-02 DIAGNOSIS — I5032 Chronic diastolic (congestive) heart failure: Secondary | ICD-10-CM | POA: Diagnosis not present

## 2016-04-06 DIAGNOSIS — F71 Moderate intellectual disabilities: Secondary | ICD-10-CM | POA: Diagnosis not present

## 2016-04-06 DIAGNOSIS — I4891 Unspecified atrial fibrillation: Secondary | ICD-10-CM | POA: Diagnosis not present

## 2016-04-06 DIAGNOSIS — I130 Hypertensive heart and chronic kidney disease with heart failure and stage 1 through stage 4 chronic kidney disease, or unspecified chronic kidney disease: Secondary | ICD-10-CM | POA: Diagnosis not present

## 2016-04-06 DIAGNOSIS — I5032 Chronic diastolic (congestive) heart failure: Secondary | ICD-10-CM | POA: Diagnosis not present

## 2016-04-06 DIAGNOSIS — N182 Chronic kidney disease, stage 2 (mild): Secondary | ICD-10-CM | POA: Diagnosis not present

## 2016-04-06 DIAGNOSIS — M6281 Muscle weakness (generalized): Secondary | ICD-10-CM | POA: Diagnosis not present

## 2016-04-07 DIAGNOSIS — I4891 Unspecified atrial fibrillation: Secondary | ICD-10-CM | POA: Diagnosis not present

## 2016-04-07 DIAGNOSIS — F71 Moderate intellectual disabilities: Secondary | ICD-10-CM | POA: Diagnosis not present

## 2016-04-07 DIAGNOSIS — I130 Hypertensive heart and chronic kidney disease with heart failure and stage 1 through stage 4 chronic kidney disease, or unspecified chronic kidney disease: Secondary | ICD-10-CM | POA: Diagnosis not present

## 2016-04-07 DIAGNOSIS — N182 Chronic kidney disease, stage 2 (mild): Secondary | ICD-10-CM | POA: Diagnosis not present

## 2016-04-07 DIAGNOSIS — M6281 Muscle weakness (generalized): Secondary | ICD-10-CM | POA: Diagnosis not present

## 2016-04-07 DIAGNOSIS — I5032 Chronic diastolic (congestive) heart failure: Secondary | ICD-10-CM | POA: Diagnosis not present

## 2016-04-07 DIAGNOSIS — Z7901 Long term (current) use of anticoagulants: Secondary | ICD-10-CM | POA: Diagnosis not present

## 2016-04-08 DIAGNOSIS — F71 Moderate intellectual disabilities: Secondary | ICD-10-CM | POA: Diagnosis not present

## 2016-04-08 DIAGNOSIS — I4891 Unspecified atrial fibrillation: Secondary | ICD-10-CM | POA: Diagnosis not present

## 2016-04-08 DIAGNOSIS — N182 Chronic kidney disease, stage 2 (mild): Secondary | ICD-10-CM | POA: Diagnosis not present

## 2016-04-08 DIAGNOSIS — I130 Hypertensive heart and chronic kidney disease with heart failure and stage 1 through stage 4 chronic kidney disease, or unspecified chronic kidney disease: Secondary | ICD-10-CM | POA: Diagnosis not present

## 2016-04-08 DIAGNOSIS — I5032 Chronic diastolic (congestive) heart failure: Secondary | ICD-10-CM | POA: Diagnosis not present

## 2016-04-08 DIAGNOSIS — M6281 Muscle weakness (generalized): Secondary | ICD-10-CM | POA: Diagnosis not present

## 2016-04-09 DIAGNOSIS — M6281 Muscle weakness (generalized): Secondary | ICD-10-CM | POA: Diagnosis not present

## 2016-04-09 DIAGNOSIS — I5032 Chronic diastolic (congestive) heart failure: Secondary | ICD-10-CM | POA: Diagnosis not present

## 2016-04-09 DIAGNOSIS — N182 Chronic kidney disease, stage 2 (mild): Secondary | ICD-10-CM | POA: Diagnosis not present

## 2016-04-09 DIAGNOSIS — I130 Hypertensive heart and chronic kidney disease with heart failure and stage 1 through stage 4 chronic kidney disease, or unspecified chronic kidney disease: Secondary | ICD-10-CM | POA: Diagnosis not present

## 2016-04-09 DIAGNOSIS — I4891 Unspecified atrial fibrillation: Secondary | ICD-10-CM | POA: Diagnosis not present

## 2016-04-09 DIAGNOSIS — F71 Moderate intellectual disabilities: Secondary | ICD-10-CM | POA: Diagnosis not present

## 2016-04-13 ENCOUNTER — Telehealth: Payer: Self-pay | Admitting: Cardiology

## 2016-04-13 ENCOUNTER — Other Ambulatory Visit: Payer: Self-pay | Admitting: Cardiovascular Disease

## 2016-04-13 NOTE — Telephone Encounter (Signed)
Calling for monitor results

## 2016-04-13 NOTE — Telephone Encounter (Signed)
messaged Dr Harl Bowie

## 2016-04-13 NOTE — Telephone Encounter (Signed)
Laura Lenis, MD  Laura Raisin, RN           Heart monitor overall unremarkable, nothing to explain her syncopal episode she had. Does not appear she has followed up with Dr Raliegh Ip since that admission, she should see him back in 3-4 weeks    Zandra Abts MD

## 2016-04-13 NOTE — Telephone Encounter (Signed)
Spoke with care taker,results given,f/u apt made 7/28 Dr Bronson Ing

## 2016-04-14 DIAGNOSIS — F71 Moderate intellectual disabilities: Secondary | ICD-10-CM | POA: Diagnosis not present

## 2016-04-14 DIAGNOSIS — N182 Chronic kidney disease, stage 2 (mild): Secondary | ICD-10-CM | POA: Diagnosis not present

## 2016-04-14 DIAGNOSIS — I130 Hypertensive heart and chronic kidney disease with heart failure and stage 1 through stage 4 chronic kidney disease, or unspecified chronic kidney disease: Secondary | ICD-10-CM | POA: Diagnosis not present

## 2016-04-14 DIAGNOSIS — I4891 Unspecified atrial fibrillation: Secondary | ICD-10-CM | POA: Diagnosis not present

## 2016-04-14 DIAGNOSIS — I5032 Chronic diastolic (congestive) heart failure: Secondary | ICD-10-CM | POA: Diagnosis not present

## 2016-04-14 DIAGNOSIS — M6281 Muscle weakness (generalized): Secondary | ICD-10-CM | POA: Diagnosis not present

## 2016-04-16 DIAGNOSIS — I5032 Chronic diastolic (congestive) heart failure: Secondary | ICD-10-CM | POA: Diagnosis not present

## 2016-04-16 DIAGNOSIS — I4891 Unspecified atrial fibrillation: Secondary | ICD-10-CM | POA: Diagnosis not present

## 2016-04-16 DIAGNOSIS — N182 Chronic kidney disease, stage 2 (mild): Secondary | ICD-10-CM | POA: Diagnosis not present

## 2016-04-16 DIAGNOSIS — M6281 Muscle weakness (generalized): Secondary | ICD-10-CM | POA: Diagnosis not present

## 2016-04-16 DIAGNOSIS — I130 Hypertensive heart and chronic kidney disease with heart failure and stage 1 through stage 4 chronic kidney disease, or unspecified chronic kidney disease: Secondary | ICD-10-CM | POA: Diagnosis not present

## 2016-04-16 DIAGNOSIS — F71 Moderate intellectual disabilities: Secondary | ICD-10-CM | POA: Diagnosis not present

## 2016-04-21 DIAGNOSIS — I130 Hypertensive heart and chronic kidney disease with heart failure and stage 1 through stage 4 chronic kidney disease, or unspecified chronic kidney disease: Secondary | ICD-10-CM | POA: Diagnosis not present

## 2016-04-21 DIAGNOSIS — M6281 Muscle weakness (generalized): Secondary | ICD-10-CM | POA: Diagnosis not present

## 2016-04-21 DIAGNOSIS — I5032 Chronic diastolic (congestive) heart failure: Secondary | ICD-10-CM | POA: Diagnosis not present

## 2016-04-21 DIAGNOSIS — F71 Moderate intellectual disabilities: Secondary | ICD-10-CM | POA: Diagnosis not present

## 2016-04-21 DIAGNOSIS — I4891 Unspecified atrial fibrillation: Secondary | ICD-10-CM | POA: Diagnosis not present

## 2016-04-21 DIAGNOSIS — N182 Chronic kidney disease, stage 2 (mild): Secondary | ICD-10-CM | POA: Diagnosis not present

## 2016-04-22 DIAGNOSIS — I130 Hypertensive heart and chronic kidney disease with heart failure and stage 1 through stage 4 chronic kidney disease, or unspecified chronic kidney disease: Secondary | ICD-10-CM | POA: Diagnosis not present

## 2016-04-22 DIAGNOSIS — F71 Moderate intellectual disabilities: Secondary | ICD-10-CM | POA: Diagnosis not present

## 2016-04-22 DIAGNOSIS — I5032 Chronic diastolic (congestive) heart failure: Secondary | ICD-10-CM | POA: Diagnosis not present

## 2016-04-22 DIAGNOSIS — I4891 Unspecified atrial fibrillation: Secondary | ICD-10-CM | POA: Diagnosis not present

## 2016-04-22 DIAGNOSIS — M6281 Muscle weakness (generalized): Secondary | ICD-10-CM | POA: Diagnosis not present

## 2016-04-22 DIAGNOSIS — N182 Chronic kidney disease, stage 2 (mild): Secondary | ICD-10-CM | POA: Diagnosis not present

## 2016-04-23 DIAGNOSIS — I4891 Unspecified atrial fibrillation: Secondary | ICD-10-CM | POA: Diagnosis not present

## 2016-04-23 DIAGNOSIS — N182 Chronic kidney disease, stage 2 (mild): Secondary | ICD-10-CM | POA: Diagnosis not present

## 2016-04-23 DIAGNOSIS — M6281 Muscle weakness (generalized): Secondary | ICD-10-CM | POA: Diagnosis not present

## 2016-04-23 DIAGNOSIS — I130 Hypertensive heart and chronic kidney disease with heart failure and stage 1 through stage 4 chronic kidney disease, or unspecified chronic kidney disease: Secondary | ICD-10-CM | POA: Diagnosis not present

## 2016-04-23 DIAGNOSIS — F71 Moderate intellectual disabilities: Secondary | ICD-10-CM | POA: Diagnosis not present

## 2016-04-23 DIAGNOSIS — I5032 Chronic diastolic (congestive) heart failure: Secondary | ICD-10-CM | POA: Diagnosis not present

## 2016-04-24 DIAGNOSIS — I5032 Chronic diastolic (congestive) heart failure: Secondary | ICD-10-CM | POA: Diagnosis not present

## 2016-04-24 DIAGNOSIS — F71 Moderate intellectual disabilities: Secondary | ICD-10-CM | POA: Diagnosis not present

## 2016-04-24 DIAGNOSIS — I4891 Unspecified atrial fibrillation: Secondary | ICD-10-CM | POA: Diagnosis not present

## 2016-04-24 DIAGNOSIS — M6281 Muscle weakness (generalized): Secondary | ICD-10-CM | POA: Diagnosis not present

## 2016-04-24 DIAGNOSIS — N182 Chronic kidney disease, stage 2 (mild): Secondary | ICD-10-CM | POA: Diagnosis not present

## 2016-04-24 DIAGNOSIS — I130 Hypertensive heart and chronic kidney disease with heart failure and stage 1 through stage 4 chronic kidney disease, or unspecified chronic kidney disease: Secondary | ICD-10-CM | POA: Diagnosis not present

## 2016-04-27 DIAGNOSIS — I4891 Unspecified atrial fibrillation: Secondary | ICD-10-CM | POA: Diagnosis not present

## 2016-04-27 DIAGNOSIS — I130 Hypertensive heart and chronic kidney disease with heart failure and stage 1 through stage 4 chronic kidney disease, or unspecified chronic kidney disease: Secondary | ICD-10-CM | POA: Diagnosis not present

## 2016-04-27 DIAGNOSIS — F71 Moderate intellectual disabilities: Secondary | ICD-10-CM | POA: Diagnosis not present

## 2016-04-27 DIAGNOSIS — M6281 Muscle weakness (generalized): Secondary | ICD-10-CM | POA: Diagnosis not present

## 2016-04-27 DIAGNOSIS — R309 Painful micturition, unspecified: Secondary | ICD-10-CM | POA: Diagnosis not present

## 2016-04-27 DIAGNOSIS — N182 Chronic kidney disease, stage 2 (mild): Secondary | ICD-10-CM | POA: Diagnosis not present

## 2016-04-27 DIAGNOSIS — I5032 Chronic diastolic (congestive) heart failure: Secondary | ICD-10-CM | POA: Diagnosis not present

## 2016-04-28 DIAGNOSIS — N182 Chronic kidney disease, stage 2 (mild): Secondary | ICD-10-CM | POA: Diagnosis not present

## 2016-04-28 DIAGNOSIS — M6281 Muscle weakness (generalized): Secondary | ICD-10-CM | POA: Diagnosis not present

## 2016-04-28 DIAGNOSIS — I4891 Unspecified atrial fibrillation: Secondary | ICD-10-CM | POA: Diagnosis not present

## 2016-04-28 DIAGNOSIS — I130 Hypertensive heart and chronic kidney disease with heart failure and stage 1 through stage 4 chronic kidney disease, or unspecified chronic kidney disease: Secondary | ICD-10-CM | POA: Diagnosis not present

## 2016-04-28 DIAGNOSIS — I5032 Chronic diastolic (congestive) heart failure: Secondary | ICD-10-CM | POA: Diagnosis not present

## 2016-04-28 DIAGNOSIS — F71 Moderate intellectual disabilities: Secondary | ICD-10-CM | POA: Diagnosis not present

## 2016-04-30 DIAGNOSIS — I130 Hypertensive heart and chronic kidney disease with heart failure and stage 1 through stage 4 chronic kidney disease, or unspecified chronic kidney disease: Secondary | ICD-10-CM | POA: Diagnosis not present

## 2016-04-30 DIAGNOSIS — I5032 Chronic diastolic (congestive) heart failure: Secondary | ICD-10-CM | POA: Diagnosis not present

## 2016-04-30 DIAGNOSIS — N182 Chronic kidney disease, stage 2 (mild): Secondary | ICD-10-CM | POA: Diagnosis not present

## 2016-04-30 DIAGNOSIS — F71 Moderate intellectual disabilities: Secondary | ICD-10-CM | POA: Diagnosis not present

## 2016-04-30 DIAGNOSIS — M6281 Muscle weakness (generalized): Secondary | ICD-10-CM | POA: Diagnosis not present

## 2016-04-30 DIAGNOSIS — I4891 Unspecified atrial fibrillation: Secondary | ICD-10-CM | POA: Diagnosis not present

## 2016-05-06 DIAGNOSIS — N182 Chronic kidney disease, stage 2 (mild): Secondary | ICD-10-CM | POA: Diagnosis not present

## 2016-05-06 DIAGNOSIS — I4891 Unspecified atrial fibrillation: Secondary | ICD-10-CM | POA: Diagnosis not present

## 2016-05-06 DIAGNOSIS — I130 Hypertensive heart and chronic kidney disease with heart failure and stage 1 through stage 4 chronic kidney disease, or unspecified chronic kidney disease: Secondary | ICD-10-CM | POA: Diagnosis not present

## 2016-05-06 DIAGNOSIS — M6281 Muscle weakness (generalized): Secondary | ICD-10-CM | POA: Diagnosis not present

## 2016-05-06 DIAGNOSIS — F71 Moderate intellectual disabilities: Secondary | ICD-10-CM | POA: Diagnosis not present

## 2016-05-06 DIAGNOSIS — I5032 Chronic diastolic (congestive) heart failure: Secondary | ICD-10-CM | POA: Diagnosis not present

## 2016-05-07 DIAGNOSIS — F71 Moderate intellectual disabilities: Secondary | ICD-10-CM | POA: Diagnosis not present

## 2016-05-07 DIAGNOSIS — I130 Hypertensive heart and chronic kidney disease with heart failure and stage 1 through stage 4 chronic kidney disease, or unspecified chronic kidney disease: Secondary | ICD-10-CM | POA: Diagnosis not present

## 2016-05-07 DIAGNOSIS — I5032 Chronic diastolic (congestive) heart failure: Secondary | ICD-10-CM | POA: Diagnosis not present

## 2016-05-07 DIAGNOSIS — I4891 Unspecified atrial fibrillation: Secondary | ICD-10-CM | POA: Diagnosis not present

## 2016-05-07 DIAGNOSIS — N182 Chronic kidney disease, stage 2 (mild): Secondary | ICD-10-CM | POA: Diagnosis not present

## 2016-05-07 DIAGNOSIS — M6281 Muscle weakness (generalized): Secondary | ICD-10-CM | POA: Diagnosis not present

## 2016-05-08 DIAGNOSIS — I5032 Chronic diastolic (congestive) heart failure: Secondary | ICD-10-CM | POA: Diagnosis not present

## 2016-05-08 DIAGNOSIS — M6281 Muscle weakness (generalized): Secondary | ICD-10-CM | POA: Diagnosis not present

## 2016-05-08 DIAGNOSIS — F71 Moderate intellectual disabilities: Secondary | ICD-10-CM | POA: Diagnosis not present

## 2016-05-08 DIAGNOSIS — I130 Hypertensive heart and chronic kidney disease with heart failure and stage 1 through stage 4 chronic kidney disease, or unspecified chronic kidney disease: Secondary | ICD-10-CM | POA: Diagnosis not present

## 2016-05-08 DIAGNOSIS — I4891 Unspecified atrial fibrillation: Secondary | ICD-10-CM | POA: Diagnosis not present

## 2016-05-08 DIAGNOSIS — N182 Chronic kidney disease, stage 2 (mild): Secondary | ICD-10-CM | POA: Diagnosis not present

## 2016-05-11 DIAGNOSIS — N182 Chronic kidney disease, stage 2 (mild): Secondary | ICD-10-CM | POA: Diagnosis not present

## 2016-05-11 DIAGNOSIS — I4891 Unspecified atrial fibrillation: Secondary | ICD-10-CM | POA: Diagnosis not present

## 2016-05-11 DIAGNOSIS — M6281 Muscle weakness (generalized): Secondary | ICD-10-CM | POA: Diagnosis not present

## 2016-05-11 DIAGNOSIS — I5032 Chronic diastolic (congestive) heart failure: Secondary | ICD-10-CM | POA: Diagnosis not present

## 2016-05-11 DIAGNOSIS — F71 Moderate intellectual disabilities: Secondary | ICD-10-CM | POA: Diagnosis not present

## 2016-05-11 DIAGNOSIS — I130 Hypertensive heart and chronic kidney disease with heart failure and stage 1 through stage 4 chronic kidney disease, or unspecified chronic kidney disease: Secondary | ICD-10-CM | POA: Diagnosis not present

## 2016-05-12 DIAGNOSIS — H401123 Primary open-angle glaucoma, left eye, severe stage: Secondary | ICD-10-CM | POA: Diagnosis not present

## 2016-05-12 DIAGNOSIS — H04129 Dry eye syndrome of unspecified lacrimal gland: Secondary | ICD-10-CM | POA: Diagnosis not present

## 2016-05-12 DIAGNOSIS — H401113 Primary open-angle glaucoma, right eye, severe stage: Secondary | ICD-10-CM | POA: Diagnosis not present

## 2016-05-14 DIAGNOSIS — I5032 Chronic diastolic (congestive) heart failure: Secondary | ICD-10-CM | POA: Diagnosis not present

## 2016-05-14 DIAGNOSIS — M6281 Muscle weakness (generalized): Secondary | ICD-10-CM | POA: Diagnosis not present

## 2016-05-14 DIAGNOSIS — I130 Hypertensive heart and chronic kidney disease with heart failure and stage 1 through stage 4 chronic kidney disease, or unspecified chronic kidney disease: Secondary | ICD-10-CM | POA: Diagnosis not present

## 2016-05-14 DIAGNOSIS — N182 Chronic kidney disease, stage 2 (mild): Secondary | ICD-10-CM | POA: Diagnosis not present

## 2016-05-14 DIAGNOSIS — I4891 Unspecified atrial fibrillation: Secondary | ICD-10-CM | POA: Diagnosis not present

## 2016-05-14 DIAGNOSIS — F71 Moderate intellectual disabilities: Secondary | ICD-10-CM | POA: Diagnosis not present

## 2016-05-15 DIAGNOSIS — F71 Moderate intellectual disabilities: Secondary | ICD-10-CM | POA: Diagnosis not present

## 2016-05-15 DIAGNOSIS — I5032 Chronic diastolic (congestive) heart failure: Secondary | ICD-10-CM | POA: Diagnosis not present

## 2016-05-15 DIAGNOSIS — I130 Hypertensive heart and chronic kidney disease with heart failure and stage 1 through stage 4 chronic kidney disease, or unspecified chronic kidney disease: Secondary | ICD-10-CM | POA: Diagnosis not present

## 2016-05-15 DIAGNOSIS — M6281 Muscle weakness (generalized): Secondary | ICD-10-CM | POA: Diagnosis not present

## 2016-05-15 DIAGNOSIS — N182 Chronic kidney disease, stage 2 (mild): Secondary | ICD-10-CM | POA: Diagnosis not present

## 2016-05-15 DIAGNOSIS — I4891 Unspecified atrial fibrillation: Secondary | ICD-10-CM | POA: Diagnosis not present

## 2016-05-21 DIAGNOSIS — Z7901 Long term (current) use of anticoagulants: Secondary | ICD-10-CM | POA: Diagnosis not present

## 2016-05-29 ENCOUNTER — Encounter: Payer: Self-pay | Admitting: Cardiovascular Disease

## 2016-05-29 ENCOUNTER — Ambulatory Visit (INDEPENDENT_AMBULATORY_CARE_PROVIDER_SITE_OTHER): Payer: Medicare Other | Admitting: Cardiovascular Disease

## 2016-05-29 VITALS — BP 140/86 | HR 79 | Ht 68.0 in | Wt 171.0 lb

## 2016-05-29 DIAGNOSIS — I5032 Chronic diastolic (congestive) heart failure: Secondary | ICD-10-CM | POA: Diagnosis not present

## 2016-05-29 DIAGNOSIS — I482 Chronic atrial fibrillation, unspecified: Secondary | ICD-10-CM

## 2016-05-29 DIAGNOSIS — I10 Essential (primary) hypertension: Secondary | ICD-10-CM | POA: Diagnosis not present

## 2016-05-29 DIAGNOSIS — R55 Syncope and collapse: Secondary | ICD-10-CM

## 2016-05-29 MED ORDER — FUROSEMIDE 40 MG PO TABS: ORAL_TABLET | ORAL | 3 refills | Status: DC

## 2016-05-29 NOTE — Patient Instructions (Signed)
Your physician wants you to follow-up in: 1 year Dr Koneswaran You will receive a reminder letter in the mail two months in advance. If you don't receive a letter, please call our office to schedule the follow-up appointment.     Your physician recommends that you continue on your current medications as directed. Please refer to the Current Medication list given to you today.     Thank you for choosing [facility] !        

## 2016-05-29 NOTE — Progress Notes (Signed)
SUBJECTIVE: The patient presents for follow-up of chronic diastolic heart failure, atrial fibrillation, and hypertension. An echocardiogram performed at an outside facility on 11/07/15 reportedly demonstrated normal left ventricular function and a small pericardial effusion.  She has a lifelong history of mental retardation, but is responsive to questions and contributes some information. Bulk of the history is provided by her niece [name]). Mrs. Overby lives in a trailer in her sister's backyard.  The patient denies chest pain, leg swelling, palpitations, and shortness of breath.  She has been feeling more weak and tired. She is taking Lasix 40 mg bid and is "urinating every 5 minutes" as per her niece.  Hospitalized for syncope in February.  Cardiac monitoring 01/2016 showed atrial flutter, avg HR 67 bpm.    Review of Systems: As per "subjective", otherwise negative.  No Known Allergies  Current Outpatient Prescriptions  Medication Sig Dispense Refill  . allopurinol (ZYLOPRIM) 100 MG tablet Take 100 mg by mouth daily.      . Brinzolamide-Brimonidine (SIMBRINZA) 1-0.2 % SUSP Place 1 drop into both eyes 2 (two) times daily.     . Cholecalciferol (VITAMIN D3) 5000 units CAPS Take 1 capsule by mouth daily.     Marland Kitchen esomeprazole (NEXIUM) 40 MG capsule TAKE ONE CAPSULE BY MOUTH TWO TIMES DAILY 60 capsule 5  . ferrous gluconate (FERGON) 324 MG tablet TAKE 1 TABLET BY MOUTH TWICE DAILY WITH MEALS 60 tablet 0  . latanoprost (XALATAN) 0.005 % ophthalmic solution Place 1 drop into both eyes at bedtime.      Marland Kitchen lisinopril (PRINIVIL,ZESTRIL) 20 MG tablet Take 1 tablet (20 mg total) by mouth 2 (two) times daily. 60 tablet 6  . loratadine (CLARITIN) 10 MG tablet Take 10 mg by mouth as needed for allergies.    Marland Kitchen meclizine (ANTIVERT) 25 MG tablet Take 0.5 tablets by mouth 2 (two) times daily as needed.  0  . metoprolol (TOPROL-XL) 200 MG 24 hr tablet TAKE 1 TABLET BY MOUTH DAILY 90 tablet 3  .  neomycin-polymyxin-hydrocortisone (CORTISPORIN) 3.5-10000-1 otic suspension Place 2 drops into both ears 4 (four) times daily.     . traMADol (ULTRAM) 50 MG tablet Take 1 tablet (50 mg total) by mouth every 4 (four) hours as needed. For pain 90 tablet 0  . warfarin (COUMADIN) 3 MG tablet Take 3 mg by mouth daily. Take one tablet (3mg ) by mouth daily     No current facility-administered medications for this visit.     Past Medical History:  Diagnosis Date  . Adenomatous polyps    -resected via colonoscopy in 2011  . Anemia   . Anxiety    MR  . Atrial fibrillation (HCC)    EF of 45%  . Degenerative joint disease    status post right total hip arthroplasty  . Dysrhythmia   . GERD (gastroesophageal reflux disease)    -Barrett's esophagus  . Glaucoma   . Glaucoma   . Gout   . Hyperlipidemia    Recent lipid profile is excellent without lipid-lowering therapy  . Hypertension   . Mental retardation   . Premature ventricular contractions   . Shortness of breath dyspnea     Past Surgical History:  Procedure Laterality Date  . CATARACT EXTRACTION  2006   left eye  . COLONOSCOPY W/ POLYPECTOMY  2011  . ESOPHAGOGASTRODUODENOSCOPY N/A 01/01/2016   Procedure: ESOPHAGOGASTRODUODENOSCOPY (EGD);  Surgeon: Rogene Houston, MD;  [facility];  Service: Endoscopy;  Laterality: N/A;  200  . EYE SURGERY    . JOINT REPLACEMENT     x2  . RADIOLOGY WITH ANESTHESIA Right 11/21/2015   Procedure: MRI OF RIGHT KNEE WITHOUT CONTAST    (RADIOLOGY WITH ANESTHESIA);  Surgeon: Medication Radiologist, MD;  Location: [place];  Service: Radiology;  Laterality: Right;  . TOTAL HIP ARTHROPLASTY  2006   Right    Social History   Social History  . Marital status: Single    Spouse name: N/A  . Number of children: N/A  . Years of education: N/A   Occupational History  . disabled    Social History Main Topics  . Smoking status: Never Smoker  . Smokeless tobacco: Never Used  . Alcohol use No    . Drug use: No  . Sexual activity: Not on file   Other Topics Concern  . Not on file   Social History Narrative  . No narrative on file     Vitals:   05/29/16 1308  BP: 140/86  Pulse: 79  SpO2: 94%  Weight: 171 lb (77.6 kg)  Height: 5\' 8"  (1.727 m)    PHYSICAL EXAM General: NAD HEENT: Normal. Neck: No JVD, no thyromegaly. Lungs: Clear to auscultation bilaterally with normal respiratory effort. CV: Nondisplaced PMI.  Normal HR, irregular rhythm, normal S1/S2, no S3, no murmur. No pretibial or periankle edema.   Abdomen: Soft, nontender, no distention.  Neurologic: Alert.  Skin: Normal.     ECG: Most recent ECG reviewed.      ASSESSMENT AND PLAN: 1. Atrial fibrillation: Rate is controlled on current therapy. No changes to metoprolol dose. Warfarin managed by Dr. Anastasio Champion.  2. Chronic diastolic heart failure: Euvolemic and compensated. Continue Lasix 40 mg daily and an extra 40 mg prn leg swelling and/or weight gain of 3 lbs in 24 hrs.  3. Essential HTN: Reasonably controlled. No changes.  4. Syncope: No recurrences. Unclear etiology.  Dispo: f/u 1 year.   Kate Sable, M.D., F.A.C.C.

## 2016-06-02 ENCOUNTER — Telehealth: Payer: Self-pay | Admitting: Cardiovascular Disease

## 2016-06-02 NOTE — Telephone Encounter (Signed)
Pharmacy changed to [facility] per patient request.

## 2016-06-03 ENCOUNTER — Other Ambulatory Visit: Payer: Self-pay | Admitting: *Deleted

## 2016-06-03 MED ORDER — METOPROLOL SUCCINATE ER 200 MG PO TB24: 200.0000 mg | ORAL_TABLET | Freq: Every day | ORAL | 3 refills | Status: DC

## 2016-06-03 MED ORDER — LISINOPRIL 20 MG PO TABS: 20.0000 mg | ORAL_TABLET | Freq: Two times a day (BID) | ORAL | 6 refills | Status: DC

## 2016-06-03 MED ORDER — FUROSEMIDE 40 MG PO TABS: ORAL_TABLET | ORAL | 3 refills | Status: DC

## 2016-06-22 DIAGNOSIS — I4891 Unspecified atrial fibrillation: Secondary | ICD-10-CM | POA: Diagnosis not present

## 2016-06-22 DIAGNOSIS — Z7901 Long term (current) use of anticoagulants: Secondary | ICD-10-CM | POA: Diagnosis not present

## 2016-07-08 DIAGNOSIS — Z5181 Encounter for therapeutic drug level monitoring: Secondary | ICD-10-CM | POA: Diagnosis not present

## 2016-07-08 DIAGNOSIS — Z7901 Long term (current) use of anticoagulants: Secondary | ICD-10-CM | POA: Diagnosis not present

## 2016-08-04 DIAGNOSIS — I10 Essential (primary) hypertension: Secondary | ICD-10-CM | POA: Diagnosis not present

## 2016-08-04 DIAGNOSIS — Z23 Encounter for immunization: Secondary | ICD-10-CM | POA: Diagnosis not present

## 2016-08-04 DIAGNOSIS — I5032 Chronic diastolic (congestive) heart failure: Secondary | ICD-10-CM | POA: Diagnosis not present

## 2016-08-04 DIAGNOSIS — I4891 Unspecified atrial fibrillation: Secondary | ICD-10-CM | POA: Diagnosis not present

## 2016-08-04 DIAGNOSIS — E559 Vitamin D deficiency, unspecified: Secondary | ICD-10-CM | POA: Diagnosis not present

## 2016-08-04 DIAGNOSIS — N182 Chronic kidney disease, stage 2 (mild): Secondary | ICD-10-CM | POA: Diagnosis not present

## 2016-09-03 DIAGNOSIS — I4891 Unspecified atrial fibrillation: Secondary | ICD-10-CM | POA: Diagnosis not present

## 2016-09-03 DIAGNOSIS — Z7901 Long term (current) use of anticoagulants: Secondary | ICD-10-CM | POA: Diagnosis not present

## 2016-09-22 DIAGNOSIS — H47233 Glaucomatous optic atrophy, bilateral: Secondary | ICD-10-CM | POA: Diagnosis not present

## 2016-09-22 DIAGNOSIS — H401133 Primary open-angle glaucoma, bilateral, severe stage: Secondary | ICD-10-CM | POA: Diagnosis not present

## 2016-09-22 DIAGNOSIS — H04129 Dry eye syndrome of unspecified lacrimal gland: Secondary | ICD-10-CM | POA: Diagnosis not present

## 2016-10-05 DIAGNOSIS — Z5181 Encounter for therapeutic drug level monitoring: Secondary | ICD-10-CM | POA: Diagnosis not present

## 2016-10-05 DIAGNOSIS — N390 Urinary tract infection, site not specified: Secondary | ICD-10-CM | POA: Diagnosis not present

## 2016-11-09 DIAGNOSIS — R42 Dizziness and giddiness: Secondary | ICD-10-CM | POA: Diagnosis not present

## 2016-11-09 DIAGNOSIS — I4891 Unspecified atrial fibrillation: Secondary | ICD-10-CM | POA: Diagnosis not present

## 2016-11-09 DIAGNOSIS — E785 Hyperlipidemia, unspecified: Secondary | ICD-10-CM | POA: Diagnosis not present

## 2016-11-09 DIAGNOSIS — F71 Moderate intellectual disabilities: Secondary | ICD-10-CM | POA: Diagnosis not present

## 2016-11-09 DIAGNOSIS — E559 Vitamin D deficiency, unspecified: Secondary | ICD-10-CM | POA: Diagnosis not present

## 2016-11-09 DIAGNOSIS — I5032 Chronic diastolic (congestive) heart failure: Secondary | ICD-10-CM | POA: Diagnosis not present

## 2016-11-09 DIAGNOSIS — N182 Chronic kidney disease, stage 2 (mild): Secondary | ICD-10-CM | POA: Diagnosis not present

## 2016-11-09 DIAGNOSIS — I10 Essential (primary) hypertension: Secondary | ICD-10-CM | POA: Diagnosis not present

## 2016-11-11 ENCOUNTER — Encounter (INDEPENDENT_AMBULATORY_CARE_PROVIDER_SITE_OTHER): Payer: Self-pay

## 2016-11-11 ENCOUNTER — Encounter (INDEPENDENT_AMBULATORY_CARE_PROVIDER_SITE_OTHER): Payer: Self-pay | Admitting: Internal Medicine

## 2016-11-30 DIAGNOSIS — N182 Chronic kidney disease, stage 2 (mild): Secondary | ICD-10-CM | POA: Diagnosis not present

## 2016-11-30 DIAGNOSIS — I5032 Chronic diastolic (congestive) heart failure: Secondary | ICD-10-CM | POA: Diagnosis not present

## 2016-11-30 DIAGNOSIS — I10 Essential (primary) hypertension: Secondary | ICD-10-CM | POA: Diagnosis not present

## 2016-11-30 DIAGNOSIS — F71 Moderate intellectual disabilities: Secondary | ICD-10-CM | POA: Diagnosis not present

## 2016-12-15 ENCOUNTER — Encounter: Payer: Self-pay | Admitting: Cardiovascular Disease

## 2016-12-15 ENCOUNTER — Ambulatory Visit (INDEPENDENT_AMBULATORY_CARE_PROVIDER_SITE_OTHER): Payer: Medicare Other | Admitting: Cardiovascular Disease

## 2016-12-15 VITALS — BP 110/78 | HR 60 | Ht 68.0 in | Wt 198.0 lb

## 2016-12-15 DIAGNOSIS — I5032 Chronic diastolic (congestive) heart failure: Secondary | ICD-10-CM | POA: Diagnosis not present

## 2016-12-15 DIAGNOSIS — I10 Essential (primary) hypertension: Secondary | ICD-10-CM | POA: Diagnosis not present

## 2016-12-15 DIAGNOSIS — I482 Chronic atrial fibrillation, unspecified: Secondary | ICD-10-CM

## 2016-12-15 DIAGNOSIS — Z5181 Encounter for therapeutic drug level monitoring: Secondary | ICD-10-CM | POA: Diagnosis not present

## 2016-12-15 DIAGNOSIS — I4891 Unspecified atrial fibrillation: Secondary | ICD-10-CM | POA: Diagnosis not present

## 2016-12-15 NOTE — Progress Notes (Signed)
SUBJECTIVE: The patient presents for follow-up of chronic diastolic heart failure, atrial fibrillation, and hypertension. An echocardiogram performed at an outside facility on 11/07/15 reportedly demonstrated normal left ventricular function and a small pericardial effusion.  She has a lifelong history of mental retardation, but is responsive to questions and contributes some information. Bulk of the history is provided by her niece [name]). Mrs. Lupe lives in a trailer in her sister's backyard.  The patient denies chest pain, leg swelling, palpitations, and shortness of breath.   Primary complaints relate to lower extremity neuropathy and a forehead sebaceous cyst. Her niece tells me blood pressures have been fluctuating recently. She also has glaucoma, ear fullness, and occasional dizziness and nausea.  Hospitalized for syncope in February 2017. Cardiac monitoring 01/2016 showed atrial flutter, avg HR 67 bpm.   Review of Systems: As per "subjective", otherwise negative.  No Known Allergies  Current Outpatient Prescriptions  Medication Sig Dispense Refill  . allopurinol (ZYLOPRIM) 100 MG tablet Take 100 mg by mouth daily.      . Brinzolamide-Brimonidine (SIMBRINZA) 1-0.2 % SUSP Place 1 drop into both eyes 2 (two) times daily.     . Cholecalciferol (VITAMIN D3) 5000 units CAPS Take 1 capsule by mouth daily.     Marland Kitchen esomeprazole (NEXIUM) 40 MG capsule TAKE ONE CAPSULE BY MOUTH TWO TIMES DAILY 60 capsule 5  . ferrous gluconate (FERGON) 324 MG tablet TAKE 1 TABLET BY MOUTH TWICE DAILY WITH MEALS 60 tablet 0  . furosemide (LASIX) 40 MG tablet Take 40 mg daily,may take extra 40 mg in pm if wt gain is over 3 lbs overnite 90 tablet 3  . latanoprost (XALATAN) 0.005 % ophthalmic solution Place 1 drop into both eyes at bedtime.      Marland Kitchen lisinopril (PRINIVIL,ZESTRIL) 20 MG tablet Take 1 tablet (20 mg total) by mouth 2 (two) times daily. 60 tablet 6  . loratadine (CLARITIN) 10 MG tablet Take 10  mg by mouth as needed for allergies.    Marland Kitchen meclizine (ANTIVERT) 25 MG tablet Take 0.5 tablets by mouth 2 (two) times daily as needed.  0  . metoprolol (TOPROL-XL) 200 MG 24 hr tablet Take 1 tablet (200 mg total) by mouth daily. 90 tablet 3  . neomycin-polymyxin-hydrocortisone (CORTISPORIN) 3.5-10000-1 otic suspension Place 2 drops into both ears 4 (four) times daily.     . traMADol (ULTRAM) 50 MG tablet Take 1 tablet (50 mg total) by mouth every 4 (four) hours as needed. For pain 90 tablet 0  . warfarin (COUMADIN) 3 MG tablet Take 3 mg by mouth daily. Take one tablet (3mg ) by mouth daily     No current facility-administered medications for this visit.     Past Medical History:  Diagnosis Date  . Adenomatous polyps    -resected via colonoscopy in 2011  . Anemia   . Anxiety    MR  . Atrial fibrillation (HCC)    EF of 45%  . Degenerative joint disease    status post right total hip arthroplasty  . Dysrhythmia   . GERD (gastroesophageal reflux disease)    -Barrett's esophagus  . Glaucoma   . Glaucoma   . Gout   . Hyperlipidemia    Recent lipid profile is excellent without lipid-lowering therapy  . Hypertension   . Mental retardation   . Premature ventricular contractions   . Shortness of breath dyspnea     Past Surgical History:  Procedure Laterality Date  . CATARACT EXTRACTION  2006   left eye  . COLONOSCOPY W/ POLYPECTOMY  2011  . ESOPHAGOGASTRODUODENOSCOPY N/A 01/01/2016   Procedure: ESOPHAGOGASTRODUODENOSCOPY (EGD);  Surgeon: Rogene Houston, MD;  [facility];  Service: Endoscopy;  Laterality: N/A;  200  . EYE SURGERY    . JOINT REPLACEMENT     x2  . RADIOLOGY WITH ANESTHESIA Right 11/21/2015   Procedure: MRI OF RIGHT KNEE WITHOUT CONTAST    (RADIOLOGY WITH ANESTHESIA);  Surgeon: Medication Radiologist, MD;  Location: [place];  Service: Radiology;  Laterality: Right;  . TOTAL HIP ARTHROPLASTY  2006   Right    Social History   Social History  . Marital  status: Single    Spouse name: N/A  . Number of children: N/A  . Years of education: N/A   Occupational History  . disabled    Social History Main Topics  . Smoking status: Never Smoker  . Smokeless tobacco: Never Used  . Alcohol use No  . Drug use: No  . Sexual activity: Not on file   Other Topics Concern  . Not on file   Social History Narrative  . No narrative on file     Vitals:   12/15/16 1004  BP: 110/78  Pulse: 60  SpO2: 96%  Weight: 198 lb (89.8 kg)  Height: 5\' 8"  (1.727 m)    PHYSICAL EXAM General: NAD HEENT: Sebaceous cyst on right forehead. Neck: No JVD, no thyromegaly. Lungs: Clear to auscultation bilaterally with normal respiratory effort. CV: Nondisplaced PMI. Normal HR, irregular rhythm, normal S1/S2, no S3, no murmur. No pretibial or periankle edema.  Abdomen: Soft, nontender, no distention.  Neurologic: Alert.  Skin: Normal.    ECG: Most recent ECG reviewed.      ASSESSMENT AND PLAN: 1. Atrial fibrillation: Rate is controlled on current therapy. No changes to metoprolol succinate dose (200 mg daily). Warfarin managed by Dr. Anastasio Champion.  2. Chronic diastolic heart failure: Euvolemic and compensated. Continue Lasix 40 mg daily and an extra 40 mg prn leg swelling and/or weight gain of 3 lbs in 24 hrs.  3. Essential HTN: Controlled. No changes.  4. Syncope: No recurrences. Unclear etiology.  Dispo: f/u 1 year.   Kate Sable, M.D., F.A.C.C.

## 2016-12-15 NOTE — Patient Instructions (Signed)
Your physician wants you to follow-up in:  1 year with Dr Koneswaran You will receive a reminder letter in the mail two months in advance. If you don't receive a letter, please call our office to schedule the follow-up appointment.    Your physician recommends that you continue on your current medications as directed. Please refer to the Current Medication list given to you today.    If you need a refill on your cardiac medications before your next appointment, please call your pharmacy.     Thank you for choosing [facility] !        

## 2016-12-31 ENCOUNTER — Encounter (INDEPENDENT_AMBULATORY_CARE_PROVIDER_SITE_OTHER): Payer: Self-pay | Admitting: Internal Medicine

## 2016-12-31 ENCOUNTER — Ambulatory Visit (INDEPENDENT_AMBULATORY_CARE_PROVIDER_SITE_OTHER): Payer: Medicare Other | Admitting: Internal Medicine

## 2016-12-31 VITALS — BP 118/70 | HR 60 | Temp 97.0°F | Wt 195.0 lb

## 2016-12-31 DIAGNOSIS — K219 Gastro-esophageal reflux disease without esophagitis: Secondary | ICD-10-CM

## 2016-12-31 DIAGNOSIS — K2270 Barrett's esophagus without dysplasia: Secondary | ICD-10-CM | POA: Diagnosis not present

## 2016-12-31 NOTE — Progress Notes (Signed)
Subjective:    Patient ID: [name], female    DOB: [birth year], 76 y.o.   MRN: [id number]  HPI Here today for f/u. She was last seen in February of 2017. Caregiver says she is doing good.  Her appetite is good. She has gained from 179 to 195.  No dysphagia. No acid reflux.  She occasionally does have acid reflux. Some dizziness at time.    01/01/2016 EGD: chronic GERD, Barrett's.  Impression: 4 cm long tubular Barrett's without ulceration or stricture. Moderate to large sliding hiatal hernia. No evidence of peptic ulcer disease. Four quadrant biopsies taken from distal and proximal segment of Barrett's mucosa  Biopsy shows Barrett's without dysplasia.  Her last colonoscopy was in 2011. Two small polyps. Small cecal ablated via cold biopsy. Biopsy: Hyperplastic polyp    Review of Systems     Past Medical History:  Diagnosis Date  . Adenomatous polyps    -resected via colonoscopy in 2011  . Anemia   . Anxiety    MR  . Atrial fibrillation (HCC)    EF of 45%  . Degenerative joint disease    status post right total hip arthroplasty  . Dysrhythmia   . GERD (gastroesophageal reflux disease)    -Barrett's esophagus  . Glaucoma   . Glaucoma   . Gout   . Hyperlipidemia    Recent lipid profile is excellent without lipid-lowering therapy  . Hypertension   . Mental retardation   . Premature ventricular contractions   . Shortness of breath dyspnea     Past Surgical History:  Procedure Laterality Date  . CATARACT EXTRACTION  2006   left eye  . COLONOSCOPY W/ POLYPECTOMY  2011  . ESOPHAGOGASTRODUODENOSCOPY N/A 01/01/2016   Procedure: ESOPHAGOGASTRODUODENOSCOPY (EGD);  Surgeon: Rogene Houston, MD;  [facility];  Service: Endoscopy;  Laterality: N/A;  200  . EYE SURGERY    . JOINT REPLACEMENT     x2  . RADIOLOGY WITH ANESTHESIA Right 11/21/2015   Procedure: MRI OF RIGHT KNEE WITHOUT CONTAST    (RADIOLOGY WITH ANESTHESIA);  Surgeon: Medication Radiologist, MD;   Location: [place];  Service: Radiology;  Laterality: Right;  . TOTAL HIP ARTHROPLASTY  2006   Right    No Known Allergies  Current Outpatient Prescriptions on File Prior to Visit  Medication Sig Dispense Refill  . allopurinol (ZYLOPRIM) 100 MG tablet Take 100 mg by mouth daily.      . Brinzolamide-Brimonidine (SIMBRINZA) 1-0.2 % SUSP Place 1 drop into both eyes 2 (two) times daily.     . Cholecalciferol (VITAMIN D3) 5000 units CAPS Take 1 capsule by mouth daily.     Marland Kitchen esomeprazole (NEXIUM) 40 MG capsule TAKE ONE CAPSULE BY MOUTH TWO TIMES DAILY 60 capsule 5  . ferrous gluconate (FERGON) 324 MG tablet TAKE 1 TABLET BY MOUTH TWICE DAILY WITH MEALS (Patient taking differently: as needed) 60 tablet 0  . furosemide (LASIX) 40 MG tablet Take 40 mg daily,may take extra 40 mg in pm if wt gain is over 3 lbs overnite 90 tablet 3  . latanoprost (XALATAN) 0.005 % ophthalmic solution Place 1 drop into both eyes at bedtime.      Marland Kitchen lisinopril (PRINIVIL,ZESTRIL) 20 MG tablet Take 1 tablet (20 mg total) by mouth 2 (two) times daily. 60 tablet 6  . loratadine (CLARITIN) 10 MG tablet Take 10 mg by mouth as needed for allergies.    Marland Kitchen meclizine (ANTIVERT) 25 MG tablet Take 0.5 tablets  by mouth 2 (two) times daily as needed.  0  . metoprolol (TOPROL-XL) 200 MG 24 hr tablet Take 1 tablet (200 mg total) by mouth daily. 90 tablet 3  . neomycin-polymyxin-hydrocortisone (CORTISPORIN) 3.5-10000-1 otic suspension Place 2 drops into both ears 4 (four) times daily.     . traMADol (ULTRAM) 50 MG tablet Take 1 tablet (50 mg total) by mouth every 4 (four) hours as needed. For pain 90 tablet 0  . warfarin (COUMADIN) 3 MG tablet Take 3 mg by mouth daily. Take one tablet (3mg ) by mouth daily     No current facility-administered medications on file prior to visit.     Objective:   Physical Exam Blood pressure 118/70, pulse 60, temperature 97 F (36.1 C), weight 195 lb (88.5 kg). Alert and oriented. Skin warm and dry. Oral  mucosa is moist.   . Sclera anicteric, conjunctivae is pink. Thyroid not enlarged. No cervical lymphadenopathy. Lungs clear. Heart regular rate and rhythm.  Abdomen is soft. Bowel sounds are positive. No hepatomegaly. No abdominal masses felt. No tenderness.  No edema to lower extremities.            Assessment & Plan:  GERD, Barrett's.  Continue the Nexium. OV in one year.

## 2016-12-31 NOTE — Patient Instructions (Signed)
Continue the Nexium. OV in 1 year.

## 2017-01-13 DIAGNOSIS — Z5181 Encounter for therapeutic drug level monitoring: Secondary | ICD-10-CM | POA: Diagnosis not present

## 2017-01-13 DIAGNOSIS — I4891 Unspecified atrial fibrillation: Secondary | ICD-10-CM | POA: Diagnosis not present

## 2017-01-21 ENCOUNTER — Other Ambulatory Visit: Payer: Self-pay | Admitting: Cardiovascular Disease

## 2017-02-09 DIAGNOSIS — I10 Essential (primary) hypertension: Secondary | ICD-10-CM | POA: Diagnosis not present

## 2017-02-09 DIAGNOSIS — I4891 Unspecified atrial fibrillation: Secondary | ICD-10-CM | POA: Diagnosis not present

## 2017-02-09 DIAGNOSIS — Z5181 Encounter for therapeutic drug level monitoring: Secondary | ICD-10-CM | POA: Diagnosis not present

## 2017-02-09 DIAGNOSIS — Z7901 Long term (current) use of anticoagulants: Secondary | ICD-10-CM | POA: Diagnosis not present

## 2017-02-16 DIAGNOSIS — H35373 Puckering of macula, bilateral: Secondary | ICD-10-CM | POA: Diagnosis not present

## 2017-02-16 DIAGNOSIS — H47233 Glaucomatous optic atrophy, bilateral: Secondary | ICD-10-CM | POA: Diagnosis not present

## 2017-02-16 DIAGNOSIS — Z961 Presence of intraocular lens: Secondary | ICD-10-CM | POA: Diagnosis not present

## 2017-02-16 DIAGNOSIS — H401133 Primary open-angle glaucoma, bilateral, severe stage: Secondary | ICD-10-CM | POA: Diagnosis not present

## 2017-03-11 DIAGNOSIS — Z7901 Long term (current) use of anticoagulants: Secondary | ICD-10-CM | POA: Diagnosis not present

## 2017-04-12 DIAGNOSIS — Z7901 Long term (current) use of anticoagulants: Secondary | ICD-10-CM | POA: Diagnosis not present

## 2017-05-04 ENCOUNTER — Other Ambulatory Visit: Payer: Self-pay | Admitting: Cardiovascular Disease

## 2017-05-11 DIAGNOSIS — I10 Essential (primary) hypertension: Secondary | ICD-10-CM | POA: Diagnosis not present

## 2017-05-11 DIAGNOSIS — N182 Chronic kidney disease, stage 2 (mild): Secondary | ICD-10-CM | POA: Diagnosis not present

## 2017-05-11 DIAGNOSIS — D1730 Benign lipomatous neoplasm of skin and subcutaneous tissue of unspecified sites: Secondary | ICD-10-CM | POA: Diagnosis not present

## 2017-05-11 DIAGNOSIS — E785 Hyperlipidemia, unspecified: Secondary | ICD-10-CM | POA: Diagnosis not present

## 2017-05-11 DIAGNOSIS — G629 Polyneuropathy, unspecified: Secondary | ICD-10-CM | POA: Diagnosis not present

## 2017-06-03 ENCOUNTER — Encounter: Payer: Self-pay | Admitting: General Surgery

## 2017-06-03 ENCOUNTER — Ambulatory Visit (INDEPENDENT_AMBULATORY_CARE_PROVIDER_SITE_OTHER): Payer: Medicare Other | Admitting: General Surgery

## 2017-06-03 VITALS — BP 147/82 | HR 78 | Temp 96.9°F | Resp 18 | Ht 68.0 in | Wt 201.0 lb

## 2017-06-03 DIAGNOSIS — H02823 Cysts of right eye, unspecified eyelid: Secondary | ICD-10-CM | POA: Diagnosis not present

## 2017-06-03 NOTE — Progress Notes (Signed)
[name]; [id number]; [birth year]   HPI Patient is a 76 year old white female with multiple medical problems who was referred to my care by Dr. Anastasio Champion for evaluation and treatment of a lump above her right eyebrow. Family states it is been present for some time, but seems to have slightly increased in size and is more above the right eyebrow now. No drainage has been noted. It is not tender to touch. Patient currently has no pain. Patient suffers from mental retardation. She is chronically anticoagulated on Coumadin due to chronic atrial fibrillation. Past Medical History:  Diagnosis Date  . Adenomatous polyps    -resected via colonoscopy in 2011  . Anemia   . Anxiety    MR  . Atrial fibrillation (HCC)    EF of 45%  . Degenerative joint disease    status post right total hip arthroplasty  . Dysrhythmia   . GERD (gastroesophageal reflux disease)    -Barrett's esophagus  . Glaucoma   . Glaucoma   . Gout   . Hyperlipidemia    Recent lipid profile is excellent without lipid-lowering therapy  . Hypertension   . Mental retardation   . Premature ventricular contractions   . Shortness of breath dyspnea     Past Surgical History:  Procedure Laterality Date  . CATARACT EXTRACTION  2006   left eye  . COLONOSCOPY W/ POLYPECTOMY  2011  . ESOPHAGOGASTRODUODENOSCOPY N/A 01/01/2016   Procedure: ESOPHAGOGASTRODUODENOSCOPY (EGD);  Surgeon: Rogene Houston, MD;  [facility];  Service: Endoscopy;  Laterality: N/A;  200  . EYE SURGERY    . JOINT REPLACEMENT     x2  . RADIOLOGY WITH ANESTHESIA Right 11/21/2015   Procedure: MRI OF RIGHT KNEE WITHOUT CONTAST    (RADIOLOGY WITH ANESTHESIA);  Surgeon: Medication Radiologist, MD;  Location: [place];  Service: Radiology;  Laterality: Right;  . TOTAL HIP ARTHROPLASTY  2006   Right    No family history on file.  Current Outpatient Prescriptions on File Prior to Visit  Medication Sig Dispense Refill  . allopurinol (ZYLOPRIM) 100 MG tablet  Take 100 mg by mouth daily.      . Brinzolamide-Brimonidine (SIMBRINZA) 1-0.2 % SUSP Place 1 drop into both eyes 2 (two) times daily.     . Cholecalciferol (VITAMIN D3) 5000 units CAPS Take 1 capsule by mouth daily.     Marland Kitchen esomeprazole (NEXIUM) 40 MG capsule TAKE ONE CAPSULE BY MOUTH TWO TIMES DAILY 60 capsule 5  . ferrous gluconate (FERGON) 324 MG tablet TAKE 1 TABLET BY MOUTH TWICE DAILY WITH MEALS (Patient taking differently: as needed) 60 tablet 0  . furosemide (LASIX) 40 MG tablet TAKE 1 TABLET BY MOUTH DAILY. MAY TAKE 1 EXTRA TABLET IF WEIGHT GAIN IS OVER 3 POUNDS OVERNIGHT 90 tablet 2  . latanoprost (XALATAN) 0.005 % ophthalmic solution Place 1 drop into both eyes at bedtime.      Marland Kitchen lisinopril (PRINIVIL,ZESTRIL) 20 MG tablet Take 1 tablet (20 mg total) by mouth 2 (two) times daily. 60 tablet 6  . loratadine (CLARITIN) 10 MG tablet Take 10 mg by mouth as needed for allergies.    Marland Kitchen meclizine (ANTIVERT) 25 MG tablet Take 0.5 tablets by mouth 2 (two) times daily as needed.  0  . metoprolol (TOPROL-XL) 200 MG 24 hr tablet TAKE 1 TABLET BY MOUTH EVERY DAY 90 tablet 2  . neomycin-polymyxin-hydrocortisone (CORTISPORIN) 3.5-10000-1 otic suspension Place 2 drops into both ears 4 (four) times daily.     Marland Kitchen  traMADol (ULTRAM) 50 MG tablet Take 1 tablet (50 mg total) by mouth every 4 (four) hours as needed. For pain 90 tablet 0  . warfarin (COUMADIN) 3 MG tablet Take 3 mg by mouth daily. Take one tablet (3mg ) by mouth daily     No current facility-administered medications on file prior to visit.     No Known Allergies  History  Alcohol Use No    History  Smoking Status  . Never Smoker  Smokeless Tobacco  . Never Used    Review of Systems  Unable to perform ROS: Mental acuity    Objective   Vitals:   06/03/17 1140  BP: (!) 147/82  Pulse: 78  Resp: 18  Temp: (!) 96.9 F (36.1 C)    Physical Exam  HENT:  Head: Normocephalic and atraumatic.  1.5 cm cystic lesion noted just above  the eyebrow. It is mobile. No drainage or erythema noted.  Cardiovascular: Normal rate.   No murmur heard. Irregularly irregular rhythm  Pulmonary/Chest: Effort normal and breath sounds normal. She has no wheezes. She has no rales.  Neurological: She is alert.  Uses a cane to walk  Vitals reviewed.   Assessment  Inclusion cyst, right eyebrow/forehead Plan   Patient is a high risk for any procedure due to were Coumadin use and multiple medical problems. Unless the cyst worsens or becomes infected, I would not recommend any further surgical intervention at this time. The family member agrees. Follow-up here as needed.

## 2017-06-03 NOTE — Patient Instructions (Signed)
Epidermal Cyst An epidermal cyst is a small, painless lump under your skin. It may be called an epidermal inclusion cyst or an infundibular cyst. The cyst contains a grayish-white, bad-smelling substance (keratin). It is important not to pop epidermal cysts yourself. These cysts are usually harmless (benign), but they can get infected. Symptoms of infection may include:  Redness.  Inflammation.  Tenderness.  Warmth.  Fever.  A grayish-white, bad-smelling substance draining from the cyst.  Pus draining from the cyst.  Follow these instructions at home:  Take over-the-counter and prescription medicines only as told by your doctor.  If you were prescribed an antibiotic, use it as told by your doctor. Do not stop using the antibiotic even if you start to feel better.  Keep the area around your cyst clean and dry.  Wear loose, dry clothing.  Do not try to pop your cyst.  Avoid touching your cyst.  Check your cyst every day for signs of infection.  Keep all follow-up visits as told by your doctor. This is important. How is this prevented?  Wear clean, dry, clothing.  Avoid wearing tight clothing.  Keep your skin clean and dry. Shower or take baths every day.  Wash your body with a benzoyl peroxide wash when you shower or bathe. Contact a health care provider if:  Your cyst has symptoms of infection.  Your condition is not improving or is getting worse.  You have a cyst that looks different from other cysts you have had.  You have a fever. Get help right away if:  Redness spreads from the cyst into the surrounding area. This information is not intended to replace advice given to you by your health care provider. Make sure you discuss any questions you have with your health care provider. Document Released: 11/26/2004 Document Revised: 06/17/2016 Document Reviewed: 08/21/2015 Elsevier Interactive Patient Education  2018 [employer]  

## 2017-06-08 DIAGNOSIS — Z7901 Long term (current) use of anticoagulants: Secondary | ICD-10-CM | POA: Diagnosis not present

## 2017-06-08 DIAGNOSIS — E785 Hyperlipidemia, unspecified: Secondary | ICD-10-CM | POA: Diagnosis not present

## 2017-06-08 DIAGNOSIS — I10 Essential (primary) hypertension: Secondary | ICD-10-CM | POA: Diagnosis not present

## 2017-06-08 DIAGNOSIS — G603 Idiopathic progressive neuropathy: Secondary | ICD-10-CM | POA: Diagnosis not present

## 2017-06-08 DIAGNOSIS — I4891 Unspecified atrial fibrillation: Secondary | ICD-10-CM | POA: Diagnosis not present

## 2017-06-08 DIAGNOSIS — F71 Moderate intellectual disabilities: Secondary | ICD-10-CM | POA: Diagnosis not present

## 2017-06-08 DIAGNOSIS — N183 Chronic kidney disease, stage 3 (moderate): Secondary | ICD-10-CM | POA: Diagnosis not present

## 2017-06-08 DIAGNOSIS — M79606 Pain in leg, unspecified: Secondary | ICD-10-CM | POA: Diagnosis not present

## 2017-06-08 DIAGNOSIS — E559 Vitamin D deficiency, unspecified: Secondary | ICD-10-CM | POA: Diagnosis not present

## 2017-06-10 DIAGNOSIS — Z131 Encounter for screening for diabetes mellitus: Secondary | ICD-10-CM | POA: Diagnosis not present

## 2017-06-10 DIAGNOSIS — F71 Moderate intellectual disabilities: Secondary | ICD-10-CM | POA: Diagnosis not present

## 2017-06-10 DIAGNOSIS — E538 Deficiency of other specified B group vitamins: Secondary | ICD-10-CM | POA: Diagnosis not present

## 2017-06-10 DIAGNOSIS — G629 Polyneuropathy, unspecified: Secondary | ICD-10-CM | POA: Diagnosis not present

## 2017-06-25 DIAGNOSIS — I4891 Unspecified atrial fibrillation: Secondary | ICD-10-CM | POA: Diagnosis not present

## 2017-06-25 DIAGNOSIS — K219 Gastro-esophageal reflux disease without esophagitis: Secondary | ICD-10-CM | POA: Diagnosis not present

## 2017-06-25 DIAGNOSIS — Z7901 Long term (current) use of anticoagulants: Secondary | ICD-10-CM | POA: Diagnosis not present

## 2017-06-25 DIAGNOSIS — D170 Benign lipomatous neoplasm of skin and subcutaneous tissue of head, face and neck: Secondary | ICD-10-CM | POA: Diagnosis not present

## 2017-06-25 DIAGNOSIS — G6289 Other specified polyneuropathies: Secondary | ICD-10-CM | POA: Diagnosis not present

## 2017-06-25 DIAGNOSIS — N182 Chronic kidney disease, stage 2 (mild): Secondary | ICD-10-CM | POA: Diagnosis not present

## 2017-06-25 DIAGNOSIS — Z5181 Encounter for therapeutic drug level monitoring: Secondary | ICD-10-CM | POA: Diagnosis not present

## 2017-06-25 DIAGNOSIS — F79 Unspecified intellectual disabilities: Secondary | ICD-10-CM | POA: Diagnosis not present

## 2017-06-25 DIAGNOSIS — I129 Hypertensive chronic kidney disease with stage 1 through stage 4 chronic kidney disease, or unspecified chronic kidney disease: Secondary | ICD-10-CM | POA: Diagnosis not present

## 2017-06-25 DIAGNOSIS — E785 Hyperlipidemia, unspecified: Secondary | ICD-10-CM | POA: Diagnosis not present

## 2017-06-25 DIAGNOSIS — E559 Vitamin D deficiency, unspecified: Secondary | ICD-10-CM | POA: Diagnosis not present

## 2017-07-01 DIAGNOSIS — D170 Benign lipomatous neoplasm of skin and subcutaneous tissue of head, face and neck: Secondary | ICD-10-CM | POA: Diagnosis not present

## 2017-07-01 DIAGNOSIS — G6289 Other specified polyneuropathies: Secondary | ICD-10-CM | POA: Diagnosis not present

## 2017-07-01 DIAGNOSIS — F79 Unspecified intellectual disabilities: Secondary | ICD-10-CM | POA: Diagnosis not present

## 2017-07-01 DIAGNOSIS — I4891 Unspecified atrial fibrillation: Secondary | ICD-10-CM | POA: Diagnosis not present

## 2017-07-01 DIAGNOSIS — N182 Chronic kidney disease, stage 2 (mild): Secondary | ICD-10-CM | POA: Diagnosis not present

## 2017-07-01 DIAGNOSIS — I129 Hypertensive chronic kidney disease with stage 1 through stage 4 chronic kidney disease, or unspecified chronic kidney disease: Secondary | ICD-10-CM | POA: Diagnosis not present

## 2017-07-06 DIAGNOSIS — G6289 Other specified polyneuropathies: Secondary | ICD-10-CM | POA: Diagnosis not present

## 2017-07-06 DIAGNOSIS — N182 Chronic kidney disease, stage 2 (mild): Secondary | ICD-10-CM | POA: Diagnosis not present

## 2017-07-06 DIAGNOSIS — I4891 Unspecified atrial fibrillation: Secondary | ICD-10-CM | POA: Diagnosis not present

## 2017-07-06 DIAGNOSIS — I129 Hypertensive chronic kidney disease with stage 1 through stage 4 chronic kidney disease, or unspecified chronic kidney disease: Secondary | ICD-10-CM | POA: Diagnosis not present

## 2017-07-06 DIAGNOSIS — F79 Unspecified intellectual disabilities: Secondary | ICD-10-CM | POA: Diagnosis not present

## 2017-07-06 DIAGNOSIS — D170 Benign lipomatous neoplasm of skin and subcutaneous tissue of head, face and neck: Secondary | ICD-10-CM | POA: Diagnosis not present

## 2017-07-13 DIAGNOSIS — G6289 Other specified polyneuropathies: Secondary | ICD-10-CM | POA: Diagnosis not present

## 2017-07-13 DIAGNOSIS — D170 Benign lipomatous neoplasm of skin and subcutaneous tissue of head, face and neck: Secondary | ICD-10-CM | POA: Diagnosis not present

## 2017-07-13 DIAGNOSIS — N182 Chronic kidney disease, stage 2 (mild): Secondary | ICD-10-CM | POA: Diagnosis not present

## 2017-07-13 DIAGNOSIS — I129 Hypertensive chronic kidney disease with stage 1 through stage 4 chronic kidney disease, or unspecified chronic kidney disease: Secondary | ICD-10-CM | POA: Diagnosis not present

## 2017-07-13 DIAGNOSIS — I4891 Unspecified atrial fibrillation: Secondary | ICD-10-CM | POA: Diagnosis not present

## 2017-07-13 DIAGNOSIS — F79 Unspecified intellectual disabilities: Secondary | ICD-10-CM | POA: Diagnosis not present

## 2017-07-19 DIAGNOSIS — Z7901 Long term (current) use of anticoagulants: Secondary | ICD-10-CM | POA: Diagnosis not present

## 2017-07-19 DIAGNOSIS — I4891 Unspecified atrial fibrillation: Secondary | ICD-10-CM | POA: Diagnosis not present

## 2017-07-20 DIAGNOSIS — G6289 Other specified polyneuropathies: Secondary | ICD-10-CM | POA: Diagnosis not present

## 2017-07-20 DIAGNOSIS — D170 Benign lipomatous neoplasm of skin and subcutaneous tissue of head, face and neck: Secondary | ICD-10-CM | POA: Diagnosis not present

## 2017-07-20 DIAGNOSIS — I4891 Unspecified atrial fibrillation: Secondary | ICD-10-CM | POA: Diagnosis not present

## 2017-07-20 DIAGNOSIS — N182 Chronic kidney disease, stage 2 (mild): Secondary | ICD-10-CM | POA: Diagnosis not present

## 2017-07-20 DIAGNOSIS — F79 Unspecified intellectual disabilities: Secondary | ICD-10-CM | POA: Diagnosis not present

## 2017-07-20 DIAGNOSIS — I129 Hypertensive chronic kidney disease with stage 1 through stage 4 chronic kidney disease, or unspecified chronic kidney disease: Secondary | ICD-10-CM | POA: Diagnosis not present

## 2017-07-26 ENCOUNTER — Emergency Department (HOSPITAL_COMMUNITY): Payer: Medicare Other

## 2017-07-26 ENCOUNTER — Encounter (HOSPITAL_COMMUNITY): Payer: Self-pay | Admitting: Emergency Medicine

## 2017-07-26 ENCOUNTER — Emergency Department (HOSPITAL_COMMUNITY)
Admission: EM | Admit: 2017-07-26 | Discharge: 2017-07-26 | Disposition: A | Discharge status: Home / Self Care | Payer: Medicare Other | Attending: Emergency Medicine | Admitting: Emergency Medicine

## 2017-07-26 DIAGNOSIS — IMO0001 Reserved for inherently not codable concepts without codable children: Secondary | ICD-10-CM

## 2017-07-26 DIAGNOSIS — I5022 Chronic systolic (congestive) heart failure: Secondary | ICD-10-CM | POA: Insufficient documentation

## 2017-07-26 DIAGNOSIS — W19XXXA Unspecified fall, initial encounter: Secondary | ICD-10-CM

## 2017-07-26 DIAGNOSIS — W06XXXA Fall from bed, initial encounter: Secondary | ICD-10-CM | POA: Insufficient documentation

## 2017-07-26 DIAGNOSIS — M25561 Pain in right knee: Secondary | ICD-10-CM | POA: Diagnosis not present

## 2017-07-26 DIAGNOSIS — M545 Low back pain: Secondary | ICD-10-CM | POA: Diagnosis not present

## 2017-07-26 DIAGNOSIS — M25571 Pain in right ankle and joints of right foot: Secondary | ICD-10-CM | POA: Insufficient documentation

## 2017-07-26 DIAGNOSIS — Y92003 Bedroom of unspecified non-institutional (private) residence as the place of occurrence of the external cause: Secondary | ICD-10-CM | POA: Diagnosis not present

## 2017-07-26 DIAGNOSIS — M1711 Unilateral primary osteoarthritis, right knee: Secondary | ICD-10-CM | POA: Diagnosis not present

## 2017-07-26 DIAGNOSIS — Z96641 Presence of right artificial hip joint: Secondary | ICD-10-CM | POA: Diagnosis not present

## 2017-07-26 DIAGNOSIS — Y92009 Unspecified place in unspecified non-institutional (private) residence as the place of occurrence of the external cause: Secondary | ICD-10-CM

## 2017-07-26 DIAGNOSIS — I130 Hypertensive heart and chronic kidney disease with heart failure and stage 1 through stage 4 chronic kidney disease, or unspecified chronic kidney disease: Secondary | ICD-10-CM | POA: Insufficient documentation

## 2017-07-26 DIAGNOSIS — Y999 Unspecified external cause status: Secondary | ICD-10-CM | POA: Diagnosis not present

## 2017-07-26 DIAGNOSIS — S32030A Wedge compression fracture of third lumbar vertebra, initial encounter for closed fracture: Secondary | ICD-10-CM | POA: Diagnosis not present

## 2017-07-26 DIAGNOSIS — F79 Unspecified intellectual disabilities: Secondary | ICD-10-CM | POA: Diagnosis not present

## 2017-07-26 DIAGNOSIS — S32020A Wedge compression fracture of second lumbar vertebra, initial encounter for closed fracture: Secondary | ICD-10-CM | POA: Diagnosis not present

## 2017-07-26 DIAGNOSIS — M25551 Pain in right hip: Secondary | ICD-10-CM | POA: Diagnosis not present

## 2017-07-26 DIAGNOSIS — N183 Chronic kidney disease, stage 3 (moderate): Secondary | ICD-10-CM | POA: Diagnosis not present

## 2017-07-26 DIAGNOSIS — S32010A Wedge compression fracture of first lumbar vertebra, initial encounter for closed fracture: Secondary | ICD-10-CM | POA: Diagnosis not present

## 2017-07-26 DIAGNOSIS — Y9389 Activity, other specified: Secondary | ICD-10-CM | POA: Diagnosis not present

## 2017-07-26 DIAGNOSIS — Z79899 Other long term (current) drug therapy: Secondary | ICD-10-CM | POA: Diagnosis not present

## 2017-07-26 DIAGNOSIS — S3992XA Unspecified injury of lower back, initial encounter: Secondary | ICD-10-CM | POA: Diagnosis present

## 2017-07-26 DIAGNOSIS — M4850XA Collapsed vertebra, not elsewhere classified, site unspecified, initial encounter for fracture: Secondary | ICD-10-CM

## 2017-07-26 DIAGNOSIS — Z7901 Long term (current) use of anticoagulants: Secondary | ICD-10-CM | POA: Diagnosis not present

## 2017-07-26 HISTORY — DX: Restless legs syndrome: G25.81

## 2017-07-26 NOTE — Discharge Instructions (Signed)
Take over the counter tylenol, as directed on packaging, as needed for discomfort.  Apply moist heat or ice to the area(s) of discomfort, for 15 minutes at a time, several times per day for the next few days.  Do not fall asleep on a heating or ice pack.  Call your regular medical doctor tomorrow to schedule a follow up appointment this week.  Return to the Emergency Department immediately if worsening.

## 2017-07-26 NOTE — ED Triage Notes (Signed)
Patient c/o right hip/groin pain after sliding onto floor yesterday evening. Per patient she was getting up from bed over to bedside commode when she slide onto floor, landing on bottom. Per patient's niece patient right ankle appears bruised and swollen as well. Patient has hx of hip replacement in past.

## 2017-07-26 NOTE — ED Provider Notes (Signed)
[place] DEPT Provider Note   CSN: [id number] Arrival date & time: 07/26/17  1050     History   Chief Complaint Chief Complaint  Patient presents with  . Fall    HPI [name] is a 76 y.o. female.  The history is provided by a caregiver and the patient. The history is limited by the condition of the patient (Hx MR).  Fall   Pt was seen at 1455. Per pt and her caregiver: Pt was transferring from her bed to bedside commode yesterday approximately 1500, when she slid to the floor. Pt states she sat down on her buttocks. Pt's caregiver states she has been c/o pain in her right hip and groin since the fall. Pt's caregiver also "thinks her right ankle looks swollen." Pt denies any other complaints. Denies LOC, no head injury, no neck pain, no CP/SOB, no abd pain, no focal motor weakness, no tingling/numbness in extremities.   Past Medical History:  Diagnosis Date  . Adenomatous polyps    -resected via colonoscopy in 2011  . Anemia   . Anxiety    MR  . Atrial fibrillation (HCC)    EF of 45%  . Degenerative joint disease    status post right total hip arthroplasty  . Dysrhythmia   . GERD (gastroesophageal reflux disease)    -Barrett's esophagus  . Glaucoma   . Glaucoma   . Gout   . Hyperlipidemia    Recent lipid profile is excellent without lipid-lowering therapy  . Hypertension   . Mental retardation   . Premature ventricular contractions   . Restless leg syndrome   . Shortness of breath dyspnea     Patient Active Problem List   Diagnosis Date Noted  . Syncope 12/26/2015  . Chronic anticoagulation 12/20/2012  . Chronic kidney disease, stage 3 12/20/2012  . Anemia, normocytic normochromic 12/20/2012  . History of diagnostic tests 12/20/2012  . Acute on chronic systolic heart failure ([place]) 11/25/2011  . Atrial fibrillation ([place])   . Degenerative joint disease   . GERD (gastroesophageal reflux disease)   . Gout   . Adenomatous polyps   . MENTAL  RETARDATION 07/30/2009  . GLAUCOMA 06/25/2009    Past Surgical History:  Procedure Laterality Date  . CATARACT EXTRACTION  2006   left eye  . COLONOSCOPY W/ POLYPECTOMY  2011  . ESOPHAGOGASTRODUODENOSCOPY N/A 01/01/2016   Procedure: ESOPHAGOGASTRODUODENOSCOPY (EGD);  Surgeon: Rogene Houston, MD;  Location: [facility];  Service: Endoscopy;  Laterality: N/A;  200  . EYE SURGERY    . JOINT REPLACEMENT     x2  . RADIOLOGY WITH ANESTHESIA Right 11/21/2015   Procedure: MRI OF RIGHT KNEE WITHOUT CONTAST    (RADIOLOGY WITH ANESTHESIA);  Surgeon: Medication Radiologist, MD;  Location: [facility];  Service: Radiology;  Laterality: Right;  . TOTAL HIP ARTHROPLASTY  2006   Right    OB History    No data available       Home Medications    Prior to Admission medications   Medication Sig Start Date End Date Taking? Authorizing Provider  allopurinol (ZYLOPRIM) 100 MG tablet Take 100 mg by mouth daily.      [provider]  Brinzolamide-Brimonidine (SIMBRINZA) 1-0.2 % SUSP Place 1 drop into both eyes 2 (two) times daily.     [provider]  Cholecalciferol (VITAMIN D3) 5000 units CAPS Take 1 capsule by mouth daily.     [provider]  esomeprazole (NEXIUM) 40 MG capsule TAKE  ONE CAPSULE BY MOUTH TWO TIMES DAILY 03/12/16   Setzer, Rona Ravens, NP  ferrous gluconate (FERGON) 324 MG tablet TAKE 1 TABLET BY MOUTH TWICE DAILY WITH MEALS Patient taking differently: as needed 08/20/14   Herminio Commons, MD  furosemide (LASIX) 40 MG tablet TAKE 1 TABLET BY MOUTH DAILY. MAY TAKE 1 EXTRA TABLET IF WEIGHT GAIN IS OVER 3 POUNDS OVERNIGHT 01/22/17   Herminio Commons, MD  latanoprost (XALATAN) 0.005 % ophthalmic solution Place 1 drop into both eyes at bedtime.      [provider]  lisinopril (PRINIVIL,ZESTRIL) 20 MG tablet Take 1 tablet (20 mg total) by mouth 2 (two) times daily. 06/03/16   Herminio Commons, MD  loratadine (CLARITIN) 10 MG tablet Take 10 mg by mouth  as needed for allergies.    [provider]  meclizine (ANTIVERT) 25 MG tablet Take 0.5 tablets by mouth 2 (two) times daily as needed. 12/23/15   [provider]  metoprolol (TOPROL-XL) 200 MG 24 hr tablet TAKE 1 TABLET BY MOUTH EVERY DAY 05/06/17   Herminio Commons, MD  neomycin-polymyxin-hydrocortisone (CORTISPORIN) 3.5-10000-1 otic suspension Place 2 drops into both ears 4 (four) times daily.     [provider]  traMADol (ULTRAM) 50 MG tablet Take 1 tablet (50 mg total) by mouth every 4 (four) hours as needed. For pain 11/26/15   Carole Civil, MD  warfarin (COUMADIN) 3 MG tablet Take 3 mg by mouth daily. Take one tablet (3mg ) by mouth daily    [provider]    Family History History reviewed. No pertinent family history.  Social History Social History  Substance Use Topics  . Smoking status: Never Smoker  . Smokeless tobacco: Never Used  . Alcohol use No     Allergies   Patient has no known allergies.   Review of Systems Review of Systems  Unable to perform ROS: Other (Hx MR)     Physical Exam Updated Vital Signs BP 115/73 (BP Location: Left Arm)   Pulse 92   Temp 98 F (36.7 C) (Tympanic)   Resp 16   Ht 5\' 8"  (1.727 m)   Wt 91.2 kg (201 lb)   SpO2 96%   BMI 30.56 kg/m   Physical Exam 1500: Physical examination:  Nursing notes reviewed; Vital signs and O2 SAT reviewed;  Constitutional: Well developed, Well nourished, Well hydrated, In no acute distress; Head:  Normocephalic, atraumatic; Eyes: EOMI, PERRL, No scleral icterus; ENMT: Mouth and pharynx normal, Mucous membranes moist; Neck: Supple, Full range of motion, No lymphadenopathy; Cardiovascular: Regular rate and rhythm, No gallop; Respiratory: Breath sounds clear & equal bilaterally, No wheezes.  Speaking full sentences with ease, Normal respiratory effort/excursion; Chest: Nontender, Movement normal; Abdomen: Soft, Nontender, Nondistended, Normal bowel sounds;  Genitourinary: No CVA tenderness; Spine:  No midline CS, TS, LS tenderness.;; Extremities: Pulses normal, Pelvis stable. NT right hip. +FROM right knee, including able to lift extended RLE against gravity, and extend right lower leg against resistance.  No ligamentous laxity.  No patellar or quad tendon step-offs.  NMS intact right foot, strong pedal pp. +plantarflexion of right foot w/calf squeeze.  No palpable gap right Achilles's tendon.  No proximal fibular head tenderness.  No edema, erythema, warmth, ecchymosis or deformity.  NMS intact right foot, strong pedal pp, LE muscle compartments soft.  No right knee tenderness, no foot tenderness.  No deformity, no ecchymosis, no open wounds.  No deformity, No edema, No calf edema or asymmetry.; Neuro:  Awake, alert, acting per baseline per caregiver. No facial droop. Speech clear. Moves all extremities spontaneously and to command without apparent gross focal motor deficits.; Skin: Color normal, Warm, Dry.   ED Treatments / Results  Labs (all labs ordered are listed, but only abnormal results are displayed)   EKG  EKG Interpretation None       Radiology   Procedures Procedures (including critical care time)  Medications Ordered in ED Medications - No data to display   Initial Impression / Assessment and Plan / ED Course  I have reviewed the triage vital signs and the nursing notes.  Pertinent labs & imaging results that were available during my care of the patient were reviewed by me and considered in my medical decision making (see chart for details).  MDM Reviewed: previous chart, nursing note and vitals Interpretation: x-ray    Dg Lumbar Spine Complete Result Date: 07/26/2017 CLINICAL DATA:  76 year old who fell while attempting to get onto a bedside commode yesterday, landing on her buttocks. Right knee pain and low back pain. Initial encounter. EXAM: LUMBAR SPINE - COMPLETE 4+ VIEW COMPARISON:  None. FINDINGS: Five  non-rib-bearing lumbar vertebrae. Grade 1 spondylolisthesis of L3 on L4 measuring approximately 8 mm and grade 1 spondylolisthesis of L4 on L5 measuring approximately 10 mm, felt to be degenerative in nature as there are severe facet degenerative changes at both levels. Age indeterminate compression fractures involving the upper endplate of L1 (75% or so), the upper endplate of L2 (less than 20%) and the upper endplate of L3 (less than 20%). Disc space narrowing and endplate hypertrophic changes at T11-12, T12-L1, L1-2, L3-4, and L4-5. No pars defects. Osseous demineralization. Prior right hip arthroplasty with anatomic alignment. Aortic atherosclerosis without aneurysm. Calcification projected over the right upper quadrant of the abdomen is likely a calcified gallstone. IMPRESSION: 1. Age indeterminate mild compression fractures of the upper endplate of L1, L2 and L3. The greatest compression is the upper endplate of L1 which is on the order of 20% or so. 2. Multilevel degenerative disc disease, spondylosis and facet degenerative changes. 3. Degenerative grade 1 spondylolisthesis of L3 on L4 and L4 on L5. 4. Osseous demineralization. 5. Cholelithiasis incidentally noted. 6.  Aortic Atherosclerosis (ICD10-170.0) Electronically Signed   By: Evangeline Dakin M.D.   On: 07/26/2017 16:07   Dg Ankle Complete Right Result Date: 07/26/2017 CLINICAL DATA:  Right ankle pain after slipping to the for while transferring from the bed to the bedside commode. EXAM: RIGHT ANKLE - COMPLETE 3+ VIEW COMPARISON:  Right ankle series of August 16, 2008 FINDINGS: The bones are subjectively osteopenic. There is moderate narrowing of the ankle joint mortise. No acute malleolar fracture is observed. The talus and calcaneus exhibit no acute abnormality. IMPRESSION: There is no acute bony abnormality of the right ankle. There is moderate degenerative narrowing of the joint mortise. Electronically Signed   By: David  Martinique M.D.   On:  07/26/2017 13:50   Dg Knee Complete 4 Views Right Result Date: 07/26/2017 CLINICAL DATA:  76 year old who fell while attempting to get onto a bedside commode yesterday, landing on her buttocks. Right knee pain and low back pain. Initial encounter. EXAM: RIGHT KNEE - COMPLETE 4+ VIEW COMPARISON:  MRI right knee 11/21/2015, 08/12/2015. Right knee x-rays 07/15/2015. FINDINGS: No evidence of acute fracture or dislocation. Severe tricompartmental joint space narrowing, greatest in the patellofemoral compartment, unchanged. Osseous demineralization. No visible joint effusion. IMPRESSION: 1. No acute osseous abnormality. 2. 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 3. Osseous demineralization. Electronically Signed   By: Evangeline Dakin M.D.   On: 07/26/2017 16:01   Dg Hip Unilat With Pelvis 2-3 Views Right Result Date: 07/26/2017 CLINICAL DATA:  Slid to the floor when transferring from the bed to the bedside commode. Patient complains of right groin and ankle pain. EXAM: DG HIP (WITH OR WITHOUT PELVIS) 2-3V RIGHT COMPARISON:  No recent studies in [facility] FINDINGS: The bones are subjectively osteopenic. No acute pelvic fracture is observed. There is a prosthetic left hip joint. Radiographic positioning of the prosthetic components is good. There is heterotopic bone superior to the greater tuberosity. The femoral stem of the prosthesis appears normally positioned within the shaft of the proximal femur. There is no evidence of loosening. No acute native bone fracture is observed. IMPRESSION: There is no acute bony abnormality of the native bone and no acute abnormality of the prosthetic right hip joint. Electronically Signed   By: David  Martinique M.D.   On: 07/26/2017 13:48    1630:  XR without acute fx. Pt states she would like to go home now. Tx symptomatically at this time. Dx and testing d/w pt and caregiver.  Questions answered.  Verb understanding, agreeable to  d/c home with outpt f/u.     Final Clinical Impressions(s) / ED Diagnoses   Final diagnoses:  Fall    New Prescriptions New Prescriptions   No medications on file      Francine Graven, DO 07/30/17 1650

## 2017-08-05 DIAGNOSIS — F79 Unspecified intellectual disabilities: Secondary | ICD-10-CM | POA: Diagnosis not present

## 2017-08-05 DIAGNOSIS — I4891 Unspecified atrial fibrillation: Secondary | ICD-10-CM | POA: Diagnosis not present

## 2017-08-05 DIAGNOSIS — G6289 Other specified polyneuropathies: Secondary | ICD-10-CM | POA: Diagnosis not present

## 2017-08-05 DIAGNOSIS — D170 Benign lipomatous neoplasm of skin and subcutaneous tissue of head, face and neck: Secondary | ICD-10-CM | POA: Diagnosis not present

## 2017-08-05 DIAGNOSIS — I129 Hypertensive chronic kidney disease with stage 1 through stage 4 chronic kidney disease, or unspecified chronic kidney disease: Secondary | ICD-10-CM | POA: Diagnosis not present

## 2017-08-05 DIAGNOSIS — N182 Chronic kidney disease, stage 2 (mild): Secondary | ICD-10-CM | POA: Diagnosis not present

## 2017-08-11 DIAGNOSIS — N390 Urinary tract infection, site not specified: Secondary | ICD-10-CM | POA: Diagnosis not present

## 2017-08-11 DIAGNOSIS — I10 Essential (primary) hypertension: Secondary | ICD-10-CM | POA: Diagnosis not present

## 2017-08-11 DIAGNOSIS — R600 Localized edema: Secondary | ICD-10-CM | POA: Diagnosis not present

## 2017-08-11 DIAGNOSIS — Z5181 Encounter for therapeutic drug level monitoring: Secondary | ICD-10-CM | POA: Diagnosis not present

## 2017-08-20 DIAGNOSIS — F79 Unspecified intellectual disabilities: Secondary | ICD-10-CM | POA: Diagnosis not present

## 2017-08-20 DIAGNOSIS — N182 Chronic kidney disease, stage 2 (mild): Secondary | ICD-10-CM | POA: Diagnosis not present

## 2017-08-20 DIAGNOSIS — I4891 Unspecified atrial fibrillation: Secondary | ICD-10-CM | POA: Diagnosis not present

## 2017-08-20 DIAGNOSIS — D170 Benign lipomatous neoplasm of skin and subcutaneous tissue of head, face and neck: Secondary | ICD-10-CM | POA: Diagnosis not present

## 2017-08-20 DIAGNOSIS — I129 Hypertensive chronic kidney disease with stage 1 through stage 4 chronic kidney disease, or unspecified chronic kidney disease: Secondary | ICD-10-CM | POA: Diagnosis not present

## 2017-08-20 DIAGNOSIS — G6289 Other specified polyneuropathies: Secondary | ICD-10-CM | POA: Diagnosis not present

## 2017-08-23 DIAGNOSIS — I4891 Unspecified atrial fibrillation: Secondary | ICD-10-CM | POA: Diagnosis not present

## 2017-08-23 DIAGNOSIS — Z23 Encounter for immunization: Secondary | ICD-10-CM | POA: Diagnosis not present

## 2017-08-23 DIAGNOSIS — R42 Dizziness and giddiness: Secondary | ICD-10-CM | POA: Diagnosis not present

## 2017-08-23 DIAGNOSIS — Z5181 Encounter for therapeutic drug level monitoring: Secondary | ICD-10-CM | POA: Diagnosis not present

## 2017-08-23 DIAGNOSIS — R600 Localized edema: Secondary | ICD-10-CM | POA: Diagnosis not present

## 2017-08-23 DIAGNOSIS — N182 Chronic kidney disease, stage 2 (mild): Secondary | ICD-10-CM | POA: Diagnosis not present

## 2017-08-23 DIAGNOSIS — I10 Essential (primary) hypertension: Secondary | ICD-10-CM | POA: Diagnosis not present

## 2017-08-23 DIAGNOSIS — Z7901 Long term (current) use of anticoagulants: Secondary | ICD-10-CM | POA: Diagnosis not present

## 2017-09-08 DIAGNOSIS — N390 Urinary tract infection, site not specified: Secondary | ICD-10-CM | POA: Diagnosis not present

## 2017-09-11 ENCOUNTER — Inpatient Hospital Stay (HOSPITAL_COMMUNITY)
Admission: EM | Admit: 2017-09-11 | Discharge: 2017-09-20 | DRG: 683 | Disposition: A | Discharge status: Skilled Nursing Facility | Payer: Medicare Other | Attending: Internal Medicine | Admitting: Internal Medicine

## 2017-09-11 ENCOUNTER — Other Ambulatory Visit: Payer: Self-pay

## 2017-09-11 ENCOUNTER — Encounter (HOSPITAL_COMMUNITY): Payer: Self-pay

## 2017-09-11 ENCOUNTER — Emergency Department (HOSPITAL_COMMUNITY): Payer: Medicare Other

## 2017-09-11 DIAGNOSIS — N189 Chronic kidney disease, unspecified: Secondary | ICD-10-CM

## 2017-09-11 DIAGNOSIS — I130 Hypertensive heart and chronic kidney disease with heart failure and stage 1 through stage 4 chronic kidney disease, or unspecified chronic kidney disease: Secondary | ICD-10-CM | POA: Diagnosis present

## 2017-09-11 DIAGNOSIS — T7401XD Adult neglect or abandonment, confirmed, subsequent encounter: Secondary | ICD-10-CM | POA: Diagnosis not present

## 2017-09-11 DIAGNOSIS — I5022 Chronic systolic (congestive) heart failure: Secondary | ICD-10-CM | POA: Diagnosis present

## 2017-09-11 DIAGNOSIS — N183 Chronic kidney disease, stage 3 unspecified: Secondary | ICD-10-CM | POA: Diagnosis present

## 2017-09-11 DIAGNOSIS — H409 Unspecified glaucoma: Secondary | ICD-10-CM | POA: Diagnosis present

## 2017-09-11 DIAGNOSIS — N170 Acute kidney failure with tubular necrosis: Principal | ICD-10-CM | POA: Diagnosis present

## 2017-09-11 DIAGNOSIS — R945 Abnormal results of liver function studies: Secondary | ICD-10-CM

## 2017-09-11 DIAGNOSIS — E871 Hypo-osmolality and hyponatremia: Secondary | ICD-10-CM | POA: Diagnosis present

## 2017-09-11 DIAGNOSIS — Z8249 Family history of ischemic heart disease and other diseases of the circulatory system: Secondary | ICD-10-CM

## 2017-09-11 DIAGNOSIS — K219 Gastro-esophageal reflux disease without esophagitis: Secondary | ICD-10-CM | POA: Diagnosis not present

## 2017-09-11 DIAGNOSIS — R748 Abnormal levels of other serum enzymes: Secondary | ICD-10-CM | POA: Diagnosis present

## 2017-09-11 DIAGNOSIS — R197 Diarrhea, unspecified: Secondary | ICD-10-CM | POA: Diagnosis not present

## 2017-09-11 DIAGNOSIS — R944 Abnormal results of kidney function studies: Secondary | ICD-10-CM | POA: Diagnosis not present

## 2017-09-11 DIAGNOSIS — I4891 Unspecified atrial fibrillation: Secondary | ICD-10-CM | POA: Diagnosis not present

## 2017-09-11 DIAGNOSIS — Z7401 Bed confinement status: Secondary | ICD-10-CM | POA: Diagnosis not present

## 2017-09-11 DIAGNOSIS — D509 Iron deficiency anemia, unspecified: Secondary | ICD-10-CM | POA: Diagnosis present

## 2017-09-11 DIAGNOSIS — S80821A Blister (nonthermal), right lower leg, initial encounter: Secondary | ICD-10-CM | POA: Diagnosis present

## 2017-09-11 DIAGNOSIS — B3731 Acute candidiasis of vulva and vagina: Secondary | ICD-10-CM | POA: Diagnosis present

## 2017-09-11 DIAGNOSIS — R4189 Other symptoms and signs involving cognitive functions and awareness: Secondary | ICD-10-CM | POA: Diagnosis present

## 2017-09-11 DIAGNOSIS — F79 Unspecified intellectual disabilities: Secondary | ICD-10-CM | POA: Diagnosis present

## 2017-09-11 DIAGNOSIS — M6281 Muscle weakness (generalized): Secondary | ICD-10-CM | POA: Diagnosis not present

## 2017-09-11 DIAGNOSIS — R778 Other specified abnormalities of plasma proteins: Secondary | ICD-10-CM | POA: Diagnosis present

## 2017-09-11 DIAGNOSIS — I361 Nonrheumatic tricuspid (valve) insufficiency: Secondary | ICD-10-CM | POA: Diagnosis not present

## 2017-09-11 DIAGNOSIS — G2581 Restless legs syndrome: Secondary | ICD-10-CM | POA: Diagnosis present

## 2017-09-11 DIAGNOSIS — B373 Candidiasis of vulva and vagina: Secondary | ICD-10-CM | POA: Diagnosis not present

## 2017-09-11 DIAGNOSIS — S80822A Blister (nonthermal), left lower leg, initial encounter: Secondary | ICD-10-CM | POA: Diagnosis present

## 2017-09-11 DIAGNOSIS — R296 Repeated falls: Secondary | ICD-10-CM | POA: Diagnosis not present

## 2017-09-11 DIAGNOSIS — E869 Volume depletion, unspecified: Secondary | ICD-10-CM | POA: Diagnosis not present

## 2017-09-11 DIAGNOSIS — Z96641 Presence of right artificial hip joint: Secondary | ICD-10-CM | POA: Diagnosis present

## 2017-09-11 DIAGNOSIS — T69022A Immersion foot, left foot, initial encounter: Secondary | ICD-10-CM

## 2017-09-11 DIAGNOSIS — J90 Pleural effusion, not elsewhere classified: Secondary | ICD-10-CM | POA: Diagnosis not present

## 2017-09-11 DIAGNOSIS — E860 Dehydration: Secondary | ICD-10-CM | POA: Diagnosis present

## 2017-09-11 DIAGNOSIS — R41841 Cognitive communication deficit: Secondary | ICD-10-CM | POA: Diagnosis not present

## 2017-09-11 DIAGNOSIS — E878 Other disorders of electrolyte and fluid balance, not elsewhere classified: Secondary | ICD-10-CM

## 2017-09-11 DIAGNOSIS — K760 Fatty (change of) liver, not elsewhere classified: Secondary | ICD-10-CM | POA: Diagnosis present

## 2017-09-11 DIAGNOSIS — G4700 Insomnia, unspecified: Secondary | ICD-10-CM | POA: Diagnosis not present

## 2017-09-11 DIAGNOSIS — I129 Hypertensive chronic kidney disease with stage 1 through stage 4 chronic kidney disease, or unspecified chronic kidney disease: Secondary | ICD-10-CM | POA: Diagnosis not present

## 2017-09-11 DIAGNOSIS — I959 Hypotension, unspecified: Secondary | ICD-10-CM | POA: Diagnosis not present

## 2017-09-11 DIAGNOSIS — E876 Hypokalemia: Secondary | ICD-10-CM | POA: Diagnosis not present

## 2017-09-11 DIAGNOSIS — Z452 Encounter for adjustment and management of vascular access device: Secondary | ICD-10-CM | POA: Diagnosis not present

## 2017-09-11 DIAGNOSIS — R279 Unspecified lack of coordination: Secondary | ICD-10-CM | POA: Diagnosis not present

## 2017-09-11 DIAGNOSIS — T7601XA Adult neglect or abandonment, suspected, initial encounter: Secondary | ICD-10-CM | POA: Diagnosis present

## 2017-09-11 DIAGNOSIS — S022XXD Fracture of nasal bones, subsequent encounter for fracture with routine healing: Secondary | ICD-10-CM | POA: Diagnosis not present

## 2017-09-11 DIAGNOSIS — Z6828 Body mass index (BMI) 28.0-28.9, adult: Secondary | ICD-10-CM | POA: Diagnosis not present

## 2017-09-11 DIAGNOSIS — K2270 Barrett's esophagus without dysplasia: Secondary | ICD-10-CM | POA: Diagnosis present

## 2017-09-11 DIAGNOSIS — T69021A Immersion foot, right foot, initial encounter: Secondary | ICD-10-CM

## 2017-09-11 DIAGNOSIS — R32 Unspecified urinary incontinence: Secondary | ICD-10-CM | POA: Diagnosis present

## 2017-09-11 DIAGNOSIS — Z7901 Long term (current) use of anticoagulants: Secondary | ICD-10-CM | POA: Diagnosis not present

## 2017-09-11 DIAGNOSIS — R2689 Other abnormalities of gait and mobility: Secondary | ICD-10-CM | POA: Diagnosis not present

## 2017-09-11 DIAGNOSIS — E873 Alkalosis: Secondary | ICD-10-CM | POA: Diagnosis present

## 2017-09-11 DIAGNOSIS — N179 Acute kidney failure, unspecified: Secondary | ICD-10-CM | POA: Diagnosis not present

## 2017-09-11 DIAGNOSIS — T7401XA Adult neglect or abandonment, confirmed, initial encounter: Secondary | ICD-10-CM | POA: Diagnosis present

## 2017-09-11 DIAGNOSIS — M109 Gout, unspecified: Secondary | ICD-10-CM | POA: Diagnosis present

## 2017-09-11 DIAGNOSIS — R7989 Other specified abnormal findings of blood chemistry: Secondary | ICD-10-CM

## 2017-09-11 DIAGNOSIS — N390 Urinary tract infection, site not specified: Secondary | ICD-10-CM | POA: Diagnosis not present

## 2017-09-11 DIAGNOSIS — I482 Chronic atrial fibrillation: Secondary | ICD-10-CM | POA: Diagnosis not present

## 2017-09-11 DIAGNOSIS — Z9842 Cataract extraction status, left eye: Secondary | ICD-10-CM

## 2017-09-11 DIAGNOSIS — X58XXXD Exposure to other specified factors, subsequent encounter: Secondary | ICD-10-CM | POA: Diagnosis not present

## 2017-09-11 DIAGNOSIS — R791 Abnormal coagulation profile: Secondary | ICD-10-CM | POA: Diagnosis present

## 2017-09-11 DIAGNOSIS — T69029A Immersion foot, unspecified foot, initial encounter: Secondary | ICD-10-CM | POA: Diagnosis present

## 2017-09-11 DIAGNOSIS — R402411 Glasgow coma scale score 13-15, in the field [EMT or ambulance]: Secondary | ICD-10-CM | POA: Diagnosis not present

## 2017-09-11 DIAGNOSIS — F70 Mild intellectual disabilities: Secondary | ICD-10-CM | POA: Diagnosis not present

## 2017-09-11 DIAGNOSIS — D649 Anemia, unspecified: Secondary | ICD-10-CM | POA: Diagnosis present

## 2017-09-11 DIAGNOSIS — Z79899 Other long term (current) drug therapy: Secondary | ICD-10-CM

## 2017-09-11 DIAGNOSIS — E785 Hyperlipidemia, unspecified: Secondary | ICD-10-CM | POA: Diagnosis present

## 2017-09-11 LAB — URINALYSIS, ROUTINE W REFLEX MICROSCOPIC
Bilirubin Urine: NEGATIVE
GLUCOSE, UA: NEGATIVE mg/dL
KETONES UR: NEGATIVE mg/dL
LEUKOCYTES UA: NEGATIVE
Nitrite: NEGATIVE
PROTEIN: NEGATIVE mg/dL
Specific Gravity, Urine: 1.011 (ref 1.005–1.030)
pH: 5 (ref 5.0–8.0)

## 2017-09-11 LAB — CBC WITH DIFFERENTIAL/PLATELET
Basophils Absolute: 0 10*3/uL (ref 0.0–0.1)
Basophils Relative: 0 %
EOS ABS: 0 10*3/uL (ref 0.0–0.7)
EOS PCT: 0 %
HEMATOCRIT: 35.5 % — AB (ref 36.0–46.0)
HEMOGLOBIN: 12.2 g/dL (ref 12.0–15.0)
LYMPHS ABS: 0.4 10*3/uL — AB (ref 0.7–4.0)
LYMPHS PCT: 4 %
MCH: 28.8 pg (ref 26.0–34.0)
MCHC: 34.4 g/dL (ref 30.0–36.0)
MCV: 83.9 fL (ref 78.0–100.0)
MONOS PCT: 6 %
Monocytes Absolute: 0.6 10*3/uL (ref 0.1–1.0)
Neutro Abs: 8.8 10*3/uL — ABNORMAL HIGH (ref 1.7–7.7)
Neutrophils Relative %: 90 %
Platelets: 254 10*3/uL (ref 150–400)
RBC: 4.23 MIL/uL (ref 3.87–5.11)
RDW: 17.4 % — AB (ref 11.5–15.5)
WBC: 9.8 10*3/uL (ref 4.0–10.5)

## 2017-09-11 LAB — COMPREHENSIVE METABOLIC PANEL
ALBUMIN: 3.3 g/dL — AB (ref 3.5–5.0)
ALK PHOS: 175 U/L — AB (ref 38–126)
ALT: 21 U/L (ref 14–54)
AST: 41 U/L (ref 15–41)
Anion gap: 18 — ABNORMAL HIGH (ref 5–15)
BILIRUBIN TOTAL: 1.9 mg/dL — AB (ref 0.3–1.2)
BUN: 65 mg/dL — AB (ref 6–20)
CALCIUM: 9 mg/dL (ref 8.9–10.3)
CO2: 40 mmol/L — ABNORMAL HIGH (ref 22–32)
CREATININE: 2.8 mg/dL — AB (ref 0.44–1.00)
Chloride: 62 mmol/L — CL (ref 101–111)
GFR calc Af Amer: 18 mL/min — ABNORMAL LOW (ref >60)
GFR, EST NON AFRICAN AMERICAN: 15 mL/min — AB (ref >60)
GLUCOSE: 121 mg/dL — AB (ref 65–99)
POTASSIUM: 1.4 mmol/L — AB (ref 3.5–5.1)
Sodium: 120 mmol/L — ABNORMAL LOW (ref 135–145)
TOTAL PROTEIN: 7.5 g/dL (ref 6.5–8.1)

## 2017-09-11 LAB — BASIC METABOLIC PANEL
Anion gap: 17 — ABNORMAL HIGH (ref 5–15)
BUN: 64 mg/dL — AB (ref 6–20)
CALCIUM: 9 mg/dL (ref 8.9–10.3)
CO2: 41 mmol/L — AB (ref 22–32)
CREATININE: 2.69 mg/dL — AB (ref 0.44–1.00)
Chloride: 65 mmol/L — ABNORMAL LOW (ref 101–111)
GFR calc Af Amer: 19 mL/min — ABNORMAL LOW (ref >60)
GFR calc non Af Amer: 16 mL/min — ABNORMAL LOW (ref >60)
GLUCOSE: 119 mg/dL — AB (ref 65–99)
Potassium: 1.8 mmol/L — CL (ref 3.5–5.1)
Sodium: 123 mmol/L — ABNORMAL LOW (ref 135–145)

## 2017-09-11 LAB — CK: Total CK: 259 U/L — ABNORMAL HIGH (ref 38–234)

## 2017-09-11 LAB — LACTIC ACID, PLASMA
Lactic Acid, Venous: 2.1 mmol/L (ref 0.5–1.9)
Lactic Acid, Venous: 2.1 mmol/L (ref 0.5–1.9)

## 2017-09-11 LAB — PROTIME-INR
INR: 8.1
PROTHROMBIN TIME: 67.1 s — AB (ref 11.4–15.2)

## 2017-09-11 LAB — TROPONIN I
TROPONIN I: 0.06 ng/mL — AB (ref <0.03)
Troponin I: 0.06 ng/mL (ref <0.03)

## 2017-09-11 LAB — MAGNESIUM: MAGNESIUM: 1.7 mg/dL (ref 1.7–2.4)

## 2017-09-11 MED ORDER — POTASSIUM CHLORIDE 10 MEQ/100ML IV SOLN
10.0000 meq | Freq: Once | INTRAVENOUS | Status: AC
  Administered 2017-09-11: 10 meq via INTRAVENOUS
  Filled 2017-09-11: qty 100

## 2017-09-11 MED ORDER — MAGNESIUM SULFATE 2 GM/50ML IV SOLN
2.0000 g | Freq: Once | INTRAVENOUS | Status: AC
  Administered 2017-09-12: 2 g via INTRAVENOUS
  Filled 2017-09-11: qty 50

## 2017-09-11 MED ORDER — POTASSIUM CHLORIDE CRYS ER 20 MEQ PO TBCR
40.0000 meq | EXTENDED_RELEASE_TABLET | Freq: Once | ORAL | Status: AC
Start: 2017-09-11 — End: 2017-09-11
  Administered 2017-09-11: 40 meq via ORAL
  Filled 2017-09-11: qty 2

## 2017-09-11 MED ORDER — POTASSIUM CHLORIDE 10 MEQ/100ML IV SOLN
10.0000 meq | INTRAVENOUS | Status: AC
  Administered 2017-09-11 (×2): 10 meq via INTRAVENOUS
  Filled 2017-09-11 (×2): qty 100

## 2017-09-11 MED ORDER — POTASSIUM CHLORIDE CRYS ER 20 MEQ PO TBCR
40.0000 meq | EXTENDED_RELEASE_TABLET | Freq: Once | ORAL | Status: AC
  Administered 2017-09-11: 40 meq via ORAL
  Filled 2017-09-11: qty 2

## 2017-09-11 NOTE — ED Notes (Signed)
Patient noted to have excoriation/bleeding to buttocks, bilateral inner thighs, and sides of labias. Patient had 2 shoes on saturated in fluid of unknown kind. Bottom of both feet have thick white skin noted as well as skin sloughing off. Cap refill >3 seconds. Bilateral pluses present via doppler. Patient also has moderate amount of scabs to bilateral lower feet. Patients linenes changed, barrier cream applied to patient buttocks. Writer also found bug in patients bed. Family at bedside.

## 2017-09-11 NOTE — ED Notes (Signed)
Date and time results received: 09/11/17 2000 (use smartphrase ".now" to insert current time)  Test: lactic  Critical Value: 2.1  Name of Provider Notified: Mcmanus  Orders Received? Or Actions Taken?:

## 2017-09-11 NOTE — ED Notes (Signed)
Date and time results received: 09/11/17 2233 (use smartphrase ".now" to insert current time)  Test: Potassium 1.8           Troponin   0.06 Critical Value:   Name of Provider Notified: [place]  Orders Received? Or Actions Taken?:

## 2017-09-11 NOTE — ED Notes (Signed)
Date and time results received: 09/11/17 @20 :01 (use smartphrase ".now" to insert current time)  Test: potassium 1.4, chloride 6.2, trop. 0.06 Critical Value: potassium 1.4, chloride 6.2, trop. 0.06  Name of Provider Notified: Dr Thurnell Garbe  Orders Received? Or Actions Taken?: additional orders placed in epic.

## 2017-09-11 NOTE — H&P (Addendum)
History and Physical  [name] UEA:[id number] DOB: 08-Jul-1941 DOA: 09/11/2017  Referring physician: Dr Thurnell Garbe, ED physician PCP: Doree Albee, MD  Outpatient Specialists: none  Patient Coming From: home  Chief Complaint: urinary incontinence  HPI: [name] is a 76 y.o. female with a history of cognitive deficits, atrial fibrillation on chronic anticoagulation, GERD, glaucoma, hypertension, neuropathy.  Due to patient's mental retardation/cognitive deficits, the history obtained by sister who takes care of the patient.  Patient lives in a trailer in the sister's backyard and is fairly unsupervised.  Over the past couple of weeks, patient has had severe urinary incontinence occurring every time she stood up.  A nurse from Dr. Lanice Shirts office came and picked up the urine on Thursday to rule out UTI.  Today, the patient asked to be taken to the doctor's office and was brought here via EMS for evaluation.  No palliating or provoking factors to the patient's urinary incontinence.  Currently, the patient was in wet shoes for the majority of the day.  Patient has been taking her medicines -her pillbox arranged by her sister, but the patient is left up to her own to take the pills.  No additional pills were taken per the patient's sister.   Emergency Department Course: Potassium 1.4, sodium 120.  Creatinine elevated at 2.8 and troponin is 0.06.  Bilirubin slightly elevated.  UA normal  Review of Systems:  Unable to obtain  Past Medical History:  Diagnosis Date  . Adenomatous polyps    -resected via colonoscopy in 2011  . Anemia   . Anxiety    MR  . Atrial fibrillation (HCC)    EF of 45%  . Degenerative joint disease    status post right total hip arthroplasty  . Dysrhythmia   . GERD (gastroesophageal reflux disease)    -Barrett's esophagus  . Glaucoma   . Glaucoma   . Gout   . Hyperlipidemia    Recent lipid profile is excellent without lipid-lowering therapy  .  Hypertension   . Mental retardation   . Premature ventricular contractions   . Restless leg syndrome   . Shortness of breath dyspnea    Past Surgical History:  Procedure Laterality Date  . CATARACT EXTRACTION  2006   left eye  . COLONOSCOPY W/ POLYPECTOMY  2011  . EYE SURGERY    . JOINT REPLACEMENT     x2  . TOTAL HIP ARTHROPLASTY  2006   Right   Social History:  reports that  has never smoked. she has never used smokeless tobacco. She reports that she does not drink alcohol or use drugs. Patient lives at home  No Known Allergies  Family history of hypertension  Prior to Admission medications   Medication Sig Start Date End Date Taking? Authorizing Provider  allopurinol (ZYLOPRIM) 100 MG tablet Take 100 mg every evening by mouth.    Yes [provider]  Brinzolamide-Brimonidine (SIMBRINZA) 1-0.2 % SUSP Place 1 drop into both eyes 2 (two) times daily.    Yes [provider]  Cholecalciferol (VITAMIN D3) 5000 units CAPS Take 1 capsule by mouth daily.    Yes [provider]  esomeprazole (NEXIUM) 40 MG capsule TAKE ONE CAPSULE BY MOUTH TWO TIMES DAILY 03/12/16  Yes Setzer, Terri L, NP  furosemide (LASIX) 40 MG tablet TAKE 1 TABLET BY MOUTH DAILY. MAY TAKE 1 EXTRA TABLET IF WEIGHT GAIN IS OVER 3 POUNDS OVERNIGHT Patient taking differently: TAKE 1 TABLET BY MOUTH EVERY OTHER DAY  01/22/17  Yes Herminio Commons, MD  gabapentin (NEURONTIN) 300 MG capsule Take 300 mg at bedtime by mouth. 08/23/17  Yes [provider]  latanoprost (XALATAN) 0.005 % ophthalmic solution Place 1 drop into both eyes at bedtime.     Yes [provider]  lisinopril (PRINIVIL,ZESTRIL) 20 MG tablet Take 1 tablet (20 mg total) by mouth 2 (two) times daily. 06/03/16  Yes Herminio Commons, MD  loratadine (CLARITIN) 10 MG tablet Take 10 mg by mouth as needed for allergies.   Yes [provider]  meclizine (ANTIVERT) 25 MG tablet Take 0.5 tablets by mouth 2 (two)  times daily as needed. 12/23/15  Yes [provider]  metoprolol (TOPROL-XL) 200 MG 24 hr tablet TAKE 1 TABLET BY MOUTH EVERY DAY 05/06/17  Yes Herminio Commons, MD  neomycin-polymyxin-hydrocortisone (CORTISPORIN) 3.5-10000-1 otic suspension Place 2 drops 4 (four) times daily as needed into both ears.    Yes [provider]  traMADol (ULTRAM) 50 MG tablet Take 1 tablet (50 mg total) by mouth every 4 (four) hours as needed. For pain 11/26/15  Yes Carole Civil, MD  warfarin (COUMADIN) 2 MG tablet Take 2 mg daily by mouth.    Yes [provider]    Physical Exam: BP 122/74   Pulse 67   Temp 98.1 F (36.7 C) (Rectal)   Resp 15   Ht 5\' 8"  (1.727 m)   Wt 86.2 kg (190 lb)   SpO2 95%   BMI 28.89 kg/m   General: Elderly Caucasian female. Awake and alert. No acute cardiopulmonary distress.  HEENT: Normocephalic atraumatic.  Right and left ears normal in appearance.  Pupils equal, round, reactive to light. Extraocular muscles are intact. Sclerae anicteric and noninjected.  Moist mucosal membranes. No mucosal lesions.  Neck: Neck supple without lymphadenopathy. No carotid bruits. No masses palpated.  Cardiovascular: Irregularly irregular rate. No murmurs, rubs, gallops auscultated. No JVD.  Respiratory: Good respiratory effort with no wheezes, rales, rhonchi. Lungs clear to auscultation bilaterally.  No accessory muscle use. Abdomen: Soft, nontender, nondistended. Active bowel sounds. No masses or hepatosplenomegaly  Genital Exam: External genitalia slight white color, particularly between the labia minora and labia majora.   Skin: Extensive discoloration of the feet bilaterally with sloughing.  Feet are cool to the touch and moist.  Pulses dopplered.  There are multiple excoriations on the lower extremities and perineum. 2+ radial pulses. Musculoskeletal: No calf or leg pain. All major joints not erythematous nontender.  No upper or lower joint deformation.  Good  ROM.  No contractures  Psychiatric: Impaired cognition. Neurologic: Difficult to assess secondary to patient noncompliance.  Cranial nerves II through XII are grossly intact.           Labs on Admission: I have personally reviewed following labs and imaging studies  CBC: Recent Labs  Lab 09/11/17 1919  WBC 9.8  NEUTROABS 8.8*  HGB 12.2  HCT 35.5*  MCV 83.9  PLT 983   Basic Metabolic Panel: Recent Labs  Lab 09/11/17 1919  NA 120*  K 1.4*  CL 62*  CO2 40*  GLUCOSE 121*  BUN 65*  CREATININE 2.80*  CALCIUM 9.0  MG 1.7   GFR: Estimated Creatinine Clearance: 20 mL/min (A) (by C-G formula based on SCr of 2.8 mg/dL (H)). Liver Function Tests: Recent Labs  Lab 09/11/17 1919  AST 41  ALT 21  ALKPHOS 175*  BILITOT 1.9*  PROT 7.5  ALBUMIN 3.3*   No results for input(s):  LIPASE, AMYLASE in the last 168 hours. No results for input(s): AMMONIA in the last 168 hours. Coagulation Profile: Recent Labs  Lab 09/11/17 1919  INR 8.10*   Cardiac Enzymes: Recent Labs  Lab 09/11/17 1919  TROPONINI 0.06*   BNP (last 3 results) No results for input(s): PROBNP in the last 8760 hours. HbA1C: No results for input(s): HGBA1C in the last 72 hours. CBG: No results for input(s): GLUCAP in the last 168 hours. Lipid Profile: No results for input(s): CHOL, HDL, LDLCALC, TRIG, CHOLHDL, LDLDIRECT in the last 72 hours. Thyroid Function Tests: No results for input(s): TSH, T4TOTAL, FREET4, T3FREE, THYROIDAB in the last 72 hours. Anemia Panel: No results for input(s): VITAMINB12, FOLATE, FERRITIN, TIBC, IRON, RETICCTPCT in the last 72 hours. Urine analysis:    Component Value Date/Time   COLORURINE YELLOW 09/11/2017 1930   APPEARANCEUR CLOUDY (A) 09/11/2017 1930   LABSPEC 1.011 09/11/2017 1930   PHURINE 5.0 09/11/2017 1930   GLUCOSEU NEGATIVE 09/11/2017 1930   HGBUR MODERATE (A) 09/11/2017 1930   BILIRUBINUR NEGATIVE 09/11/2017 1930   KETONESUR NEGATIVE 09/11/2017 1930    PROTEINUR NEGATIVE 09/11/2017 1930   NITRITE NEGATIVE 09/11/2017 1930   LEUKOCYTESUR NEGATIVE 09/11/2017 1930   Sepsis Labs: @LABRCNTIP (procalcitonin:4,lacticidven:4) )No results found for this or any previous visit (from the past 240 hour(s)).   Radiological Exams on Admission: Dg Chest Port 1 View  Result Date: 09/11/2017 CLINICAL DATA:  76 y/o  F; weakness, dry heaves, recent falls. EXAM: PORTABLE CHEST 1 VIEW COMPARISON:  12/26/2015 chest radiograph FINDINGS: Stable cardiomegaly and large hiatal hernia. Diffuse haziness of lungs, reticular opacities, and blunted costal diaphragmatic angles. No acute osseous abnormality identified. IMPRESSION: Mild pulmonary edema and small bilateral pleural effusions. Stable cardiomegaly and large hiatal hernia Electronically Signed   By: Kristine Garbe M.D.   On: 09/11/2017 19:39    EKG: Independently reviewed.  Atrial fibrillation with right bundle branch block.  No acute ST changes  Assessment/Plan: Principal Problem:   Hypokalemia Active Problems:   MENTAL RETARDATION   GERD (gastroesophageal reflux disease)   Atrial fibrillation (HCC)   Chronic anticoagulation   Chronic kidney disease, stage 3 (HCC)   Hyponatremia   Acute renal injury ([place])   Dehydration   Trench foot   Supratherapeutic INR   Adult neglect   Hyperbilirubinemia   Elevated troponin    This patient was discussed with the ED physician, including pertinent vitals, physical exam findings, labs, and imaging.  We also discussed care given by the ED provider.  1.  Hypokalemia -severe  Admit to ICU  We will stat recheck the patient's potassium and sodium to make sure that this is a real value  Continue replacement  Telemetry monitoring  Recheck BMP at 1 AM and tomorrow morning  Hold Lasix 2.  Hyponatremia  Secondary to dehydration  Continue IV fluids  Recheck at 1 and tomorrow morning 3.  Supratherapeutic INR  Hold Coumadin  Vitamin K 5 mg  orally 4.  Acute renal injury  Secondary to dehydration  Recheck creatinine tomorrow. IV fluid replacement 5.  Dehydration  Rehydrate 6.  Trench foot  Warm the patient's feet 7.  Adult neglect  Social work consult 8.  Elevated troponin  Recheck troponin 9.  Hyperbilirubinemia  Recheck in the morning 10.  GERD  Continue PPI 11.  Atrial fibrillation  Rate controlled  Continue metoprolol 12.  Mental retardation  It is apparent that the patient is unable to take care of herself and the family is  unable to provide around-the-clock care and supervision of the patient.  Will consult social work for possible placement and care management for home health needs 13.  Yeast vulvovaginitis  Diflucan 150 mg every 3 days x3 doses  DVT prophylaxis: Supratherapeutic INR Consultants: none Code Status: full Family Communication: sister in room during the interview Disposition Plan:    Truett Mainland, DO [facility] Pager [phone]  If 7PM-7AM, please contact night-coverage [email or web address] Password [email or web address]

## 2017-09-11 NOTE — ED Notes (Signed)
Date and time results received: 09/11/17 1954 (use smartphrase ".now" to insert current time)  Test: INR Critical Value: 8.10  Name of Provider Notified: Mcmanus  Orders Received? Or Actions Taken?

## 2017-09-11 NOTE — ED Notes (Signed)
ED Provider at bedside. 

## 2017-09-11 NOTE — ED Notes (Signed)
[employer]

## 2017-09-11 NOTE — ED Provider Notes (Signed)
[facility] EMERGENCY DEPARTMENT Provider Note   CSN: [id number] Arrival date & time: 09/11/17  1810     History   Chief Complaint Chief Complaint  Patient presents with  . Urinary Incontinence    HPI [name] is a 76 y.o. female.  The history is provided by a relative, a caregiver, the patient and the EMS personnel. The history is limited by the condition of the patient (Hx MR).  Pt was seen at [place]. Per EMS and pt's caregivers:  Pt has been having urinary frequency and been incontinent of urine for the past 1 week. RN came to house to collect urine mid-week to r/o UTI, but family does not know results. Pt apparently has been wearing wet shoes for the past 2 days and "a bug" found in her clothing.   Past Medical History:  Diagnosis Date  . Adenomatous polyps    -resected via colonoscopy in 2011  . Anemia   . Anxiety    MR  . Atrial fibrillation (HCC)    EF of 45%  . Degenerative joint disease    status post right total hip arthroplasty  . Dysrhythmia   . GERD (gastroesophageal reflux disease)    -Barrett's esophagus  . Glaucoma   . Glaucoma   . Gout   . Hyperlipidemia    Recent lipid profile is excellent without lipid-lowering therapy  . Hypertension   . Mental retardation   . Premature ventricular contractions   . Restless leg syndrome   . Shortness of breath dyspnea     Patient Active Problem List   Diagnosis Date Noted  . Syncope 12/26/2015  . Chronic anticoagulation 12/20/2012  . Chronic kidney disease, stage 3 ([place]) 12/20/2012  . Anemia, normocytic normochromic 12/20/2012  . History of diagnostic tests 12/20/2012  . Acute on chronic systolic heart failure ([place]) 11/25/2011  . Atrial fibrillation ([place])   . Degenerative joint disease   . GERD (gastroesophageal reflux disease)   . Gout   . Adenomatous polyps   . MENTAL RETARDATION 07/30/2009  . GLAUCOMA 06/25/2009    Past Surgical History:  Procedure Laterality Date  . CATARACT EXTRACTION   2006   left eye  . COLONOSCOPY W/ POLYPECTOMY  2011  . EYE SURGERY    . JOINT REPLACEMENT     x2  . TOTAL HIP ARTHROPLASTY  2006   Right    OB History    No data available       Home Medications    Prior to Admission medications   Medication Sig Start Date End Date Taking? Authorizing Provider  allopurinol (ZYLOPRIM) 100 MG tablet Take 100 mg every evening by mouth.    Yes [provider]  Brinzolamide-Brimonidine (SIMBRINZA) 1-0.2 % SUSP Place 1 drop into both eyes 2 (two) times daily.    Yes [provider]  Cholecalciferol (VITAMIN D3) 5000 units CAPS Take 1 capsule by mouth daily.    Yes [provider]  esomeprazole (NEXIUM) 40 MG capsule TAKE ONE CAPSULE BY MOUTH TWO TIMES DAILY 03/12/16  Yes Setzer, Terri L, NP  furosemide (LASIX) 40 MG tablet TAKE 1 TABLET BY MOUTH DAILY. MAY TAKE 1 EXTRA TABLET IF WEIGHT GAIN IS OVER 3 POUNDS OVERNIGHT Patient taking differently: TAKE 1 TABLET BY MOUTH EVERY OTHER DAY 01/22/17  Yes Herminio Commons, MD  gabapentin (NEURONTIN) 300 MG capsule Take 300 mg at bedtime by mouth. 08/23/17  Yes [provider]  latanoprost (XALATAN) 0.005 % ophthalmic solution Place 1  drop into both eyes at bedtime.     Yes [provider]  lisinopril (PRINIVIL,ZESTRIL) 20 MG tablet Take 1 tablet (20 mg total) by mouth 2 (two) times daily. 06/03/16  Yes Herminio Commons, MD  loratadine (CLARITIN) 10 MG tablet Take 10 mg by mouth as needed for allergies.   Yes [provider]  meclizine (ANTIVERT) 25 MG tablet Take 0.5 tablets by mouth 2 (two) times daily as needed. 12/23/15  Yes [provider]  metoprolol (TOPROL-XL) 200 MG 24 hr tablet TAKE 1 TABLET BY MOUTH EVERY DAY 05/06/17  Yes Herminio Commons, MD  neomycin-polymyxin-hydrocortisone (CORTISPORIN) 3.5-10000-1 otic suspension Place 2 drops 4 (four) times daily as needed into both ears.    Yes [provider]  traMADol (ULTRAM) 50 MG  tablet Take 1 tablet (50 mg total) by mouth every 4 (four) hours as needed. For pain 11/26/15  Yes Carole Civil, MD  warfarin (COUMADIN) 2 MG tablet Take 2 mg daily by mouth.    Yes [provider]    Family History No family history on file.  Social History Social History   Tobacco Use  . Smoking status: Never Smoker  . Smokeless tobacco: Never Used  Substance Use Topics  . Alcohol use: No  . Drug use: No     Allergies   Patient has no known allergies.   Review of Systems Review of Systems  Unable to perform ROS: Other     Physical Exam Updated Vital Signs BP 110/78 (BP Location: Left Arm)   Pulse 64   Temp 98.1 F (36.7 C) (Rectal)   Resp 15   Ht 5\' 8"  (1.727 m)   Wt 86.2 kg (190 lb)   SpO2 96%   BMI 28.89 kg/m   Physical Exam 1845: Physical examination:  Nursing notes reviewed; Vital signs and O2 SAT reviewed;  Constitutional: Well developed, Well nourished, Well hydrated, In no acute distress; Head:  Normocephalic, atraumatic; Eyes: EOMI, PERRL, No scleral icterus; ENMT: Mouth and pharynx normal, Mucous membranes moist; Neck: Supple, Full range of motion, No lymphadenopathy; Cardiovascular: Irregular rate and rhythm, No gallop; Respiratory: Breath sounds clear & equal bilaterally, No wheezes.  Speaking full sentences with ease, Normal respiratory effort/excursion; Chest: Nontender, Movement normal; Abdomen: Soft, Nontender, Nondistended, Normal bowel sounds; Genitourinary: No CVA tenderness; Extremities:  Pt has multiple excoriations to bilat lower legs. +bilat feet wet, cool to touch, thick white skin to soles of feet, some sloughing of skin to feet and bilat LE's. Doppler pedal pulses, delayed cap refill. No deformity.  No edema, No calf edema or asymmetry.; Neuro: Awake, alert, confused per hx MR. No facial droop. Major CN grossly intact.  Speech clear. No gross focal motor deficits in extremities.; Skin: Color normal, Warm, Dry.   ED Treatments /  Results  Labs (all labs ordered are listed, but only abnormal results are displayed)   EKG  EKG Interpretation  Date/Time:  Saturday September 11 2017 19:29:11 EST Ventricular Rate:  70 PR Interval:    QRS Duration: 131 QT Interval:  495 QTC Calculation: 535 R Axis:   119 Text Interpretation:  Atrial fibrillation Right bundle branch block Abnormal lateral Q waves Borderline ST depression, lateral leads When compared with ECG of 12/26/2015 Nonspecific ST and T wave abnormality is now Present Confirmed by Francine Graven [id number]) on 09/11/2017 8:00:01 PM       Radiology   Procedures Procedures (including critical care time)  Medications Ordered in ED Medications - No  data to display   Initial Impression / Assessment and Plan / ED Course  I have reviewed the triage vital signs and the nursing notes.  Pertinent labs & imaging results that were available during my care of the patient were reviewed by me and considered in my medical decision making (see chart for details).  MDM Reviewed: previous chart, nursing note and vitals Reviewed previous: labs and ECG Interpretation: labs, ECG and x-ray Total time providing critical care: 30-74 minutes. This excludes time spent performing separately reportable procedures and services. Consults: admitting MD    CRITICAL CARE Performed by: Alfonzo Feller Total critical care time: 35 minutes Critical care time was exclusive of separately billable procedures and treating other patients. Critical care was necessary to treat or prevent imminent or life-threatening deterioration. Critical care was time spent personally by me on the following activities: development of treatment plan with patient and/or surrogate as well as nursing, discussions with consultants, evaluation of patient's response to treatment, examination of patient, obtaining history from patient or surrogate, ordering and performing treatments and interventions, ordering  and review of laboratory studies, ordering and review of radiographic studies, pulse oximetry and re-evaluation of patient's condition.   Results for orders placed or performed during the hospital encounter of 09/11/17  Comprehensive metabolic panel  Result Value Ref Range   Sodium 120 (L) 135 - 145 mmol/L   Potassium 1.4 (LL) 3.5 - 5.1 mmol/L   Chloride 62 (LL) 101 - 111 mmol/L   CO2 40 (H) 22 - 32 mmol/L   Glucose, Bld 121 (H) 65 - 99 mg/dL   BUN 65 (H) 6 - 20 mg/dL   Creatinine, Ser 2.80 (H) 0.44 - 1.00 mg/dL   Calcium 9.0 8.9 - 10.3 mg/dL   Total Protein 7.5 6.5 - 8.1 g/dL   Albumin 3.3 (L) 3.5 - 5.0 g/dL   AST 41 15 - 41 U/L   ALT 21 14 - 54 U/L   Alkaline Phosphatase 175 (H) 38 - 126 U/L   Total Bilirubin 1.9 (H) 0.3 - 1.2 mg/dL   GFR calc non Af Amer 15 (L) >60 mL/min   GFR calc Af Amer 18 (L) >60 mL/min   Anion gap 18 (H) 5 - 15  Troponin I  Result Value Ref Range   Troponin I 0.06 (HH) <0.03 ng/mL  Lactic acid, plasma  Result Value Ref Range   Lactic Acid, Venous 2.1 (HH) 0.5 - 1.9 mmol/L  CBC with Differential  Result Value Ref Range   WBC 9.8 4.0 - 10.5 K/uL   RBC 4.23 3.87 - 5.11 MIL/uL   Hemoglobin 12.2 12.0 - 15.0 g/dL   HCT 35.5 (L) 36.0 - 46.0 %   MCV 83.9 78.0 - 100.0 fL   MCH 28.8 26.0 - 34.0 pg   MCHC 34.4 30.0 - 36.0 g/dL   RDW 17.4 (H) 11.5 - 15.5 %   Platelets 254 150 - 400 K/uL   Neutrophils Relative % 90 %   Neutro Abs 8.8 (H) 1.7 - 7.7 K/uL   Lymphocytes Relative 4 %   Lymphs Abs 0.4 (L) 0.7 - 4.0 K/uL   Monocytes Relative 6 %   Monocytes Absolute 0.6 0.1 - 1.0 K/uL   Eosinophils Relative 0 %   Eosinophils Absolute 0.0 0.0 - 0.7 K/uL   Basophils Relative 0 %   Basophils Absolute 0.0 0.0 - 0.1 K/uL  Protime-INR  Result Value Ref Range   Prothrombin Time 67.1 (H) 11.4 - 15.2 seconds   INR  8.10 (HH)   Urinalysis, Routine w reflex microscopic  Result Value Ref Range   Color, Urine YELLOW YELLOW   APPearance CLOUDY (A) CLEAR   Specific  Gravity, Urine 1.011 1.005 - 1.030   pH 5.0 5.0 - 8.0   Glucose, UA NEGATIVE NEGATIVE mg/dL   Hgb urine dipstick MODERATE (A) NEGATIVE   Bilirubin Urine NEGATIVE NEGATIVE   Ketones, ur NEGATIVE NEGATIVE mg/dL   Protein, ur NEGATIVE NEGATIVE mg/dL   Nitrite NEGATIVE NEGATIVE   Leukocytes, UA NEGATIVE NEGATIVE   RBC / HPF 0-5 0 - 5 RBC/hpf   WBC, UA 0-5 0 - 5 WBC/hpf   Bacteria, UA RARE (A) NONE SEEN   Squamous Epithelial / LPF TOO NUMEROUS TO COUNT (A) NONE SEEN   Mucus PRESENT    Hyaline Casts, UA PRESENT    Granular Casts, UA PRESENT   Magnesium  Result Value Ref Range   Magnesium 1.7 1.7 - 2.4 mg/dL   Dg Chest Port 1 View Result Date: 09/11/2017 CLINICAL DATA:  76 y/o  F; weakness, dry heaves, recent falls. EXAM: PORTABLE CHEST 1 VIEW COMPARISON:  12/26/2015 chest radiograph FINDINGS: Stable cardiomegaly and large hiatal hernia. Diffuse haziness of lungs, reticular opacities, and blunted costal diaphragmatic angles. No acute osseous abnormality identified. IMPRESSION: Mild pulmonary edema and small bilateral pleural effusions. Stable cardiomegaly and large hiatal hernia Electronically Signed   By: Kristine Garbe M.D.   On: 09/11/2017 19:39     1850:  ED RN noted pt to have excoriation to her buttocks, inner thighs and labia; both shoes were saturated with liquid. Clothing and shoes were removed and pt was dried, placed on clean/dry/warm linens.   2115:  Multiple IV potassium runs ordered, as well as PO potassium. INR elevated, but no acute bleeding. Judicious IVF given.  T/C to [facility] Dr. Nehemiah Settle, case discussed, including:  HPI, pertinent PM/SHx, VS/PE, dx testing, ED course and treatment:  Agreeable to admit, requests to repeat BMP now as well as place PICC line order (placed).       Final Clinical Impressions(s) / ED Diagnoses   Final diagnoses:  None    ED Discharge Orders    None        Francine Graven, DO 09/14/17 0347

## 2017-09-11 NOTE — ED Triage Notes (Signed)
Patient here from by by [facility]. States family reported to EMS that patient has been incontinent for secveral days and they are not able to care for patient. Urine culture was sent off at doctors office but not returned. Patient has several sores on bilateral lower legs.

## 2017-09-12 ENCOUNTER — Inpatient Hospital Stay (HOSPITAL_COMMUNITY): Payer: Medicare Other

## 2017-09-12 ENCOUNTER — Other Ambulatory Visit: Payer: Self-pay

## 2017-09-12 ENCOUNTER — Encounter (HOSPITAL_COMMUNITY): Payer: Self-pay | Admitting: Emergency Medicine

## 2017-09-12 DIAGNOSIS — T7401XD Adult neglect or abandonment, confirmed, subsequent encounter: Secondary | ICD-10-CM

## 2017-09-12 DIAGNOSIS — I361 Nonrheumatic tricuspid (valve) insufficiency: Secondary | ICD-10-CM

## 2017-09-12 LAB — OSMOLALITY: Osmolality: 257 mOsm/kg — ABNORMAL LOW (ref 275–295)

## 2017-09-12 LAB — COMPREHENSIVE METABOLIC PANEL
ALBUMIN: 2.6 g/dL — AB (ref 3.5–5.0)
ALK PHOS: 136 U/L — AB (ref 38–126)
ALT: 16 U/L (ref 14–54)
AST: 34 U/L (ref 15–41)
Anion gap: 14 (ref 5–15)
BILIRUBIN TOTAL: 1.4 mg/dL — AB (ref 0.3–1.2)
BUN: 58 mg/dL — AB (ref 6–20)
CALCIUM: 8.2 mg/dL — AB (ref 8.9–10.3)
CO2: 35 mmol/L — ABNORMAL HIGH (ref 22–32)
Chloride: 72 mmol/L — ABNORMAL LOW (ref 101–111)
Creatinine, Ser: 2.24 mg/dL — ABNORMAL HIGH (ref 0.44–1.00)
GFR calc Af Amer: 23 mL/min — ABNORMAL LOW (ref >60)
GFR calc non Af Amer: 20 mL/min — ABNORMAL LOW (ref >60)
GLUCOSE: 100 mg/dL — AB (ref 65–99)
Potassium: 2.3 mmol/L — CL (ref 3.5–5.1)
Sodium: 121 mmol/L — ABNORMAL LOW (ref 135–145)
TOTAL PROTEIN: 5.7 g/dL — AB (ref 6.5–8.1)

## 2017-09-12 LAB — ECHOCARDIOGRAM COMPLETE
Height: 67 in
Weight: 2941.82 oz

## 2017-09-12 LAB — MRSA PCR SCREENING: MRSA by PCR: NEGATIVE

## 2017-09-12 LAB — NA AND K (SODIUM & POTASSIUM), RAND UR
Potassium Urine: 15 mmol/L
Sodium, Ur: 12 mmol/L

## 2017-09-12 LAB — MAGNESIUM: MAGNESIUM: 2.1 mg/dL (ref 1.7–2.4)

## 2017-09-12 LAB — PROTIME-INR
INR: 9.61 — AB
PROTHROMBIN TIME: 76.7 s — AB (ref 11.4–15.2)

## 2017-09-12 MED ORDER — ALLOPURINOL 100 MG PO TABS
100.0000 mg | ORAL_TABLET | Freq: Every evening | ORAL | Status: DC
  Administered 2017-09-12 – 2017-09-19 (×8): 100 mg via ORAL
  Filled 2017-09-12 (×8): qty 1

## 2017-09-12 MED ORDER — LATANOPROST 0.005 % OP SOLN
1.0000 [drp] | Freq: Every day | OPHTHALMIC | Status: DC
  Administered 2017-09-12 – 2017-09-19 (×9): 1 [drp] via OPHTHALMIC
  Filled 2017-09-12 (×2): qty 2.5

## 2017-09-12 MED ORDER — HYDROCODONE-ACETAMINOPHEN 5-325 MG PO TABS
1.0000 | ORAL_TABLET | ORAL | Status: DC | PRN
  Administered 2017-09-12 – 2017-09-20 (×7): 1 via ORAL
  Filled 2017-09-12 (×7): qty 1

## 2017-09-12 MED ORDER — VITAMIN K1 10 MG/ML IJ SOLN
3.0000 mg | Freq: Once | INTRAMUSCULAR | Status: AC
  Administered 2017-09-12: 3 mg via SUBCUTANEOUS
  Filled 2017-09-12: qty 1

## 2017-09-12 MED ORDER — ONDANSETRON HCL 4 MG/2ML IJ SOLN
4.0000 mg | Freq: Four times a day (QID) | INTRAMUSCULAR | Status: DC | PRN
  Administered 2017-09-12 – 2017-09-16 (×2): 4 mg via INTRAVENOUS
  Filled 2017-09-12 (×2): qty 2

## 2017-09-12 MED ORDER — MECLIZINE HCL 12.5 MG PO TABS: 12.5000 mg | ORAL_TABLET | Freq: Two times a day (BID) | ORAL | Status: DC | PRN

## 2017-09-12 MED ORDER — LORATADINE 10 MG PO TABS: 10.0000 mg | ORAL_TABLET | ORAL | Status: DC | PRN

## 2017-09-12 MED ORDER — ONDANSETRON HCL 4 MG PO TABS: 4.0000 mg | ORAL_TABLET | Freq: Four times a day (QID) | ORAL | Status: DC | PRN

## 2017-09-12 MED ORDER — ACETAMINOPHEN 325 MG PO TABS: 650.0000 mg | ORAL_TABLET | Freq: Four times a day (QID) | ORAL | Status: DC | PRN

## 2017-09-12 MED ORDER — POTASSIUM CHLORIDE 10 MEQ/100ML IV SOLN
10.0000 meq | INTRAVENOUS | Status: AC
  Administered 2017-09-12 (×5): 10 meq via INTRAVENOUS
  Filled 2017-09-12 (×5): qty 100

## 2017-09-12 MED ORDER — SODIUM CHLORIDE 0.9 % IV SOLN
INTRAVENOUS | Status: DC
  Administered 2017-09-12: 01:00:00 via INTRAVENOUS

## 2017-09-12 MED ORDER — POTASSIUM CHLORIDE IN NACL 40-0.9 MEQ/L-% IV SOLN
INTRAVENOUS | Status: DC
  Administered 2017-09-12: 100 mL/h via INTRAVENOUS
  Administered 2017-09-13 – 2017-09-14 (×2): 75 mL/h via INTRAVENOUS

## 2017-09-12 MED ORDER — PHYTONADIONE 5 MG PO TABS
5.0000 mg | ORAL_TABLET | Freq: Once | ORAL | Status: AC
  Administered 2017-09-12: 5 mg via ORAL
  Filled 2017-09-12 (×2): qty 1

## 2017-09-12 MED ORDER — GABAPENTIN 300 MG PO CAPS
300.0000 mg | ORAL_CAPSULE | Freq: Every day | ORAL | Status: DC
  Administered 2017-09-12 – 2017-09-19 (×9): 300 mg via ORAL
  Filled 2017-09-12 (×9): qty 1

## 2017-09-12 MED ORDER — ACETAMINOPHEN 650 MG RE SUPP: 650.0000 mg | Freq: Four times a day (QID) | RECTAL | Status: DC | PRN

## 2017-09-12 MED ORDER — TRAMADOL HCL 50 MG PO TABS
50.0000 mg | ORAL_TABLET | ORAL | Status: DC | PRN
  Administered 2017-09-16 – 2017-09-18 (×2): 50 mg via ORAL
  Filled 2017-09-12 (×2): qty 1

## 2017-09-12 MED ORDER — PANTOPRAZOLE SODIUM 40 MG PO TBEC
80.0000 mg | DELAYED_RELEASE_TABLET | Freq: Every day | ORAL | Status: DC
  Administered 2017-09-12 – 2017-09-15 (×4): 80 mg via ORAL
  Filled 2017-09-12 (×4): qty 2

## 2017-09-12 MED ORDER — POTASSIUM CHLORIDE CRYS ER 20 MEQ PO TBCR
40.0000 meq | EXTENDED_RELEASE_TABLET | Freq: Three times a day (TID) | ORAL | Status: DC
  Administered 2017-09-12: 40 meq via ORAL
  Filled 2017-09-12: qty 2

## 2017-09-12 MED ORDER — METOPROLOL SUCCINATE ER 50 MG PO TB24
200.0000 mg | ORAL_TABLET | Freq: Every day | ORAL | Status: DC
  Administered 2017-09-12 – 2017-09-13 (×2): 200 mg via ORAL
  Filled 2017-09-12 (×2): qty 4

## 2017-09-12 MED ORDER — ORAL CARE MOUTH RINSE
15.0000 mL | Freq: Two times a day (BID) | OROMUCOSAL | Status: DC
  Administered 2017-09-12 – 2017-09-19 (×9): 15 mL via OROMUCOSAL

## 2017-09-12 MED ORDER — POTASSIUM CHLORIDE CRYS ER 20 MEQ PO TBCR
40.0000 meq | EXTENDED_RELEASE_TABLET | Freq: Every day | ORAL | Status: DC
  Administered 2017-09-13: 40 meq via ORAL
  Filled 2017-09-12: qty 2

## 2017-09-12 MED ORDER — SODIUM CHLORIDE 0.9% FLUSH
3.0000 mL | Freq: Two times a day (BID) | INTRAVENOUS | Status: DC
  Administered 2017-09-12 – 2017-09-19 (×11): 3 mL via INTRAVENOUS

## 2017-09-12 MED ORDER — VITAMIN K1 1 MG/0.5ML IJ SOLN
3.0000 mg | Freq: Once | INTRAMUSCULAR | Status: DC
  Filled 2017-09-12: qty 1.5

## 2017-09-12 MED ORDER — FLUCONAZOLE 100 MG PO TABS
150.0000 mg | ORAL_TABLET | ORAL | Status: AC
  Administered 2017-09-12 – 2017-09-18 (×3): 150 mg via ORAL
  Filled 2017-09-12 (×3): qty 2

## 2017-09-12 MED ORDER — MAGNESIUM SULFATE 2 GM/50ML IV SOLN: 2.0000 g | Freq: Once | INTRAVENOUS | Status: DC

## 2017-09-12 NOTE — Consult Note (Signed)
Reason for Consult: Multiple electrolyte imbalance and renal failure Referring Physician: Dr. Denzil Magnuson is an 76 y.o. female.  HPI: She is a patient who has history of atrial fibrillation, hypertension, mental retardation and presently was brought to the hospital because of urine incontinence, frequency and generalized weakness.  According to her sister she was having a problem with urine incontinence and possibility of urinary tract infection was entertained and her urine was checked by her PCP.  Since patient continued to feel weak she was brought to the emergency room where she was found to have hypokalemia, hyponatremia and renal failure and is admitted to the hospital.  Her sister states that she was told about her renal failure before but she does not remember how bad it was.  Patient does not have any nausea or vomiting.  She has infrequent diarrhea possibly once or twice a week.  Her appetite is poor but no nausea or vomiting.  Past Medical History:  Diagnosis Date  . Adenomatous polyps    -resected via colonoscopy in 2011  . Anemia   . Anxiety    MR  . Atrial fibrillation (HCC)    EF of 45%  . Degenerative joint disease    status post right total hip arthroplasty  . Dysrhythmia   . GERD (gastroesophageal reflux disease)    -Barrett's esophagus  . Glaucoma   . Glaucoma   . Gout   . Hyperlipidemia    Recent lipid profile is excellent without lipid-lowering therapy  . Hypertension   . Mental retardation   . Premature ventricular contractions   . Restless leg syndrome   . Shortness of breath dyspnea     Past Surgical History:  Procedure Laterality Date  . CATARACT EXTRACTION  2006   left eye  . COLONOSCOPY W/ POLYPECTOMY  2011  . EYE SURGERY    . JOINT REPLACEMENT     x2  . TOTAL HIP ARTHROPLASTY  2006   Right    History reviewed. No pertinent family history.  Social History:  reports that  has never smoked. she has never used smokeless tobacco. She  reports that she does not drink alcohol or use drugs.  Allergies: No Known Allergies  Medications: I have reviewed the patient's current medications.  Results for orders placed or performed during the hospital encounter of 09/11/17 (from the past 48 hour(s))  Comprehensive metabolic panel     Status: Abnormal   Collection Time: 09/11/17  7:19 PM  Result Value Ref Range   Sodium 120 (L) 135 - 145 mmol/L   Potassium 1.4 (LL) 3.5 - 5.1 mmol/L    Comment: CRITICAL RESULT CALLED TO, READ BACK BY AND VERIFIED WITH: POINDEXTER,M. AT 2001 ON 09/11/2017 BY EVA    Chloride 62 (LL) 101 - 111 mmol/L    Comment: CRITICAL RESULT CALLED TO, READ BACK BY AND VERIFIED WITH: POINDEXTER,M. AT 2001 ON 09/11/2017 BY EVA    CO2 40 (H) 22 - 32 mmol/L   Glucose, Bld 121 (H) 65 - 99 mg/dL   BUN 65 (H) 6 - 20 mg/dL   Creatinine, Ser 2.80 (H) 0.44 - 1.00 mg/dL   Calcium 9.0 8.9 - 10.3 mg/dL   Total Protein 7.5 6.5 - 8.1 g/dL   Albumin 3.3 (L) 3.5 - 5.0 g/dL   AST 41 15 - 41 U/L   ALT 21 14 - 54 U/L   Alkaline Phosphatase 175 (H) 38 - 126 U/L   Total Bilirubin 1.9 (H) 0.3 -  1.2 mg/dL   GFR calc non Af Amer 15 (L) >60 mL/min   GFR calc Af Amer 18 (L) >60 mL/min    Comment: (NOTE) The eGFR has been calculated using the CKD EPI equation. This calculation has not been validated in all clinical situations. eGFR's persistently <60 mL/min signify possible Chronic Kidney Disease.    Anion gap 18 (H) 5 - 15  Troponin I     Status: Abnormal   Collection Time: 09/11/17  7:19 PM  Result Value Ref Range   Troponin I 0.06 (HH) <0.03 ng/mL    Comment: CRITICAL RESULT CALLED TO, READ BACK BY AND VERIFIED WITH: POINDEXTER,M. AT 2001 ON 09/11/2017 BY EVA   Lactic acid, plasma     Status: Abnormal   Collection Time: 09/11/17  7:19 PM  Result Value Ref Range   Lactic Acid, Venous 2.1 (HH) 0.5 - 1.9 mmol/L    Comment: CRITICAL RESULT CALLED TO, READ BACK BY AND VERIFIED WITH: HAYEMORE,R. AT 1959 ON 09/11/2017 BY  EVA   CBC with Differential     Status: Abnormal   Collection Time: 09/11/17  7:19 PM  Result Value Ref Range   WBC 9.8 4.0 - 10.5 K/uL   RBC 4.23 3.87 - 5.11 MIL/uL   Hemoglobin 12.2 12.0 - 15.0 g/dL   HCT 35.5 (L) 36.0 - 46.0 %   MCV 83.9 78.0 - 100.0 fL   MCH 28.8 26.0 - 34.0 pg   MCHC 34.4 30.0 - 36.0 g/dL   RDW 17.4 (H) 11.5 - 15.5 %   Platelets 254 150 - 400 K/uL   Neutrophils Relative % 90 %   Neutro Abs 8.8 (H) 1.7 - 7.7 K/uL   Lymphocytes Relative 4 %   Lymphs Abs 0.4 (L) 0.7 - 4.0 K/uL   Monocytes Relative 6 %   Monocytes Absolute 0.6 0.1 - 1.0 K/uL   Eosinophils Relative 0 %   Eosinophils Absolute 0.0 0.0 - 0.7 K/uL   Basophils Relative 0 %   Basophils Absolute 0.0 0.0 - 0.1 K/uL  Protime-INR     Status: Abnormal   Collection Time: 09/11/17  7:19 PM  Result Value Ref Range   Prothrombin Time 67.1 (H) 11.4 - 15.2 seconds   INR 8.10 (HH)     Comment: CRITICAL RESULT CALLED TO, READ BACK BY AND VERIFIED WITH:  HAYMORE,R @ 1953 ON 09/11/17 BY JUW RESULT REPEATED AND VERIFIED CRITICAL RESULT CALLED TO, READ BACK BY AND VERIFIED WITH:  HAYMORE,R @ 1953 ON 09/11/17 BY JUW   Magnesium     Status: None   Collection Time: 09/11/17  7:19 PM  Result Value Ref Range   Magnesium 1.7 1.7 - 2.4 mg/dL  Urinalysis, Routine w reflex microscopic     Status: Abnormal   Collection Time: 09/11/17  7:30 PM  Result Value Ref Range   Color, Urine YELLOW YELLOW   APPearance CLOUDY (A) CLEAR   Specific Gravity, Urine 1.011 1.005 - 1.030   pH 5.0 5.0 - 8.0   Glucose, UA NEGATIVE NEGATIVE mg/dL   Hgb urine dipstick MODERATE (A) NEGATIVE   Bilirubin Urine NEGATIVE NEGATIVE   Ketones, ur NEGATIVE NEGATIVE mg/dL   Protein, ur NEGATIVE NEGATIVE mg/dL   Nitrite NEGATIVE NEGATIVE   Leukocytes, UA NEGATIVE NEGATIVE   RBC / HPF 0-5 0 - 5 RBC/hpf   WBC, UA 0-5 0 - 5 WBC/hpf   Bacteria, UA RARE (A) NONE SEEN   Squamous Epithelial / LPF TOO NUMEROUS TO COUNT (A) NONE  SEEN   Mucus PRESENT     Hyaline Casts, UA PRESENT    Granular Casts, UA PRESENT   Basic metabolic panel     Status: Abnormal   Collection Time: 09/11/17  9:24 PM  Result Value Ref Range   Sodium 123 (L) 135 - 145 mmol/L   Potassium 1.8 (LL) 3.5 - 5.1 mmol/L    Comment: CRITICAL RESULT CALLED TO, READ BACK BY AND VERIFIED WITH: HAYMORE,R. AT 2232 ON 09/11/2017 BY EVA    Chloride 65 (L) 101 - 111 mmol/L   CO2 41 (H) 22 - 32 mmol/L   Glucose, Bld 119 (H) 65 - 99 mg/dL   BUN 64 (H) 6 - 20 mg/dL   Creatinine, Ser 2.69 (H) 0.44 - 1.00 mg/dL   Calcium 9.0 8.9 - 10.3 mg/dL   GFR calc non Af Amer 16 (L) >60 mL/min   GFR calc Af Amer 19 (L) >60 mL/min    Comment: (NOTE) The eGFR has been calculated using the CKD EPI equation. This calculation has not been validated in all clinical situations. eGFR's persistently <60 mL/min signify possible Chronic Kidney Disease.    Anion gap 17 (H) 5 - 15  Troponin I     Status: Abnormal   Collection Time: 09/11/17  9:24 PM  Result Value Ref Range   Troponin I 0.06 (HH) <0.03 ng/mL    Comment: CRITICAL RESULT CALLED TO, READ BACK BY AND VERIFIED WITH: HAYMORE,R. ON 09/11/2017 AT 2232 BY EVA   CK     Status: Abnormal   Collection Time: 09/11/17  9:24 PM  Result Value Ref Range   Total CK 259 (H) 38 - 234 U/L  Lactic acid, plasma     Status: Abnormal   Collection Time: 09/11/17  9:25 PM  Result Value Ref Range   Lactic Acid, Venous 2.1 (HH) 0.5 - 1.9 mmol/L    Comment: CRITICAL RESULT CALLED TO, READ BACK BY AND VERIFIED WITH: HAYMORE,R. AT 2222 ON 09/11/2017 BY EVA   MRSA PCR Screening     Status: None   Collection Time: 09/12/17  1:30 AM  Result Value Ref Range   MRSA by PCR NEGATIVE NEGATIVE    Comment:        The GeneXpert MRSA Assay (FDA approved for NASAL specimens only), is one component of a comprehensive MRSA colonization surveillance program. It is not intended to diagnose MRSA infection nor to guide or monitor treatment for MRSA infections.    Protime-INR     Status: Abnormal   Collection Time: 09/12/17  3:57 AM  Result Value Ref Range   Prothrombin Time 76.7 (H) 11.4 - 15.2 seconds   INR 9.61 (HH)     Comment: RESULT REPEATED AND VERIFIED CRITICAL RESULT CALLED TO, READ BACK BY AND VERIFIED WITH: HILTON,L AT 0720 ON 11.11.2018 BY ISLEY,B   Comprehensive metabolic panel     Status: Abnormal   Collection Time: 09/12/17  3:57 AM  Result Value Ref Range   Sodium 121 (L) 135 - 145 mmol/L   Potassium 2.3 (LL) 3.5 - 5.1 mmol/L    Comment: DELTA CHECK NOTED   Chloride 72 (L) 101 - 111 mmol/L   CO2 35 (H) 22 - 32 mmol/L   Glucose, Bld 100 (H) 65 - 99 mg/dL   BUN 58 (H) 6 - 20 mg/dL   Creatinine, Ser 2.24 (H) 0.44 - 1.00 mg/dL   Calcium 8.2 (L) 8.9 - 10.3 mg/dL   Total Protein 5.7 (L) 6.5 - 8.1 g/dL  Albumin 2.6 (L) 3.5 - 5.0 g/dL   AST 34 15 - 41 U/L   ALT 16 14 - 54 U/L   Alkaline Phosphatase 136 (H) 38 - 126 U/L   Total Bilirubin 1.4 (H) 0.3 - 1.2 mg/dL   GFR calc non Af Amer 20 (L) >60 mL/min   GFR calc Af Amer 23 (L) >60 mL/min    Comment: (NOTE) The eGFR has been calculated using the CKD EPI equation. This calculation has not been validated in all clinical situations. eGFR's persistently <60 mL/min signify possible Chronic Kidney Disease.    Anion gap 14 5 - 15  Magnesium     Status: None   Collection Time: 09/12/17  7:30 AM  Result Value Ref Range   Magnesium 2.1 1.7 - 2.4 mg/dL    Dg Chest Port 1 View  Result Date: 09/11/2017 CLINICAL DATA:  76 y/o  F; weakness, dry heaves, recent falls. EXAM: PORTABLE CHEST 1 VIEW COMPARISON:  12/26/2015 chest radiograph FINDINGS: Stable cardiomegaly and large hiatal hernia. Diffuse haziness of lungs, reticular opacities, and blunted costal diaphragmatic angles. No acute osseous abnormality identified. IMPRESSION: Mild pulmonary edema and small bilateral pleural effusions. Stable cardiomegaly and large hiatal hernia Electronically Signed   By: Kristine Garbe M.D.    On: 09/11/2017 19:39    Review of Systems  Constitutional: Negative for chills and fever.  Respiratory: Negative for cough and shortness of breath.   Cardiovascular: Negative for orthopnea.  Gastrointestinal: Positive for diarrhea and nausea. Negative for vomiting.       Diarrhea once or twice a week  Genitourinary: Positive for frequency and urgency. Negative for hematuria.       Urine incontinence  Musculoskeletal:       Weakness and unable to ambulate   Blood pressure (!) 101/59, pulse 68, temperature 97.6 F (36.4 C), temperature source Axillary, resp. rate 13, height 5' 7"  (1.702 m), weight 83.4 kg (183 lb 13.8 oz), SpO2 99 %. Physical Exam  Constitutional:  Patient is somnolent but arousable.  Her communication is limited  Eyes: No scleral icterus.  Neck: No JVD present.  Cardiovascular:  No murmur heard. iregular rate and rhythm  Respiratory: No respiratory distress. She has no wheezes.  GI: She exhibits no distension. There is no tenderness.  Musculoskeletal: She exhibits no edema.  Neurological:  Patient is sleepy but arousable.  Answers few questions and goes back.     Assessment/Plan: 1] hypokalemia: Most likely from diuretics.  Her potassium is low but improving. 2] hyponatremia: Possibly hypovolemic hyponatremia. 3] renal failure: Chronic.  Her creatinine was 1.47 on 02/14/2010                                                                         1.91 on  11/14/11                                                                         1.31 on 10/05/2015  1.49 on 11/14/2015 with EGFR of 34 cc/min.  Hence a stage III.  Etiology could be secondary to hypertension/ischemia/age-related renal function loss.  Other etiologies at this moment cannot be ruled out.  The present increase in creatinine could be from prerenal syndrome versus secondary to natural progression of her disease. 4] hypertension: Her  blood pressure is reasonably controlled 5] atrial fibrillation: She is on Coumadin 6] history of mental retardation 7] GERD 8] history of urine incontinence. Plan: We will change IV fluid to normal saline with 40 mEq of KCl at 100 cc/h 2] decrease potassium to 40 mEq p.o. once a day 3] we will check urine sodium, potassium, osmolality and serum osmolality 4] we will do ultrasound of the kidneys 5] we will check a renal panel in the morning.  Avalene Sealy S 09/12/2017, 9:39 AM

## 2017-09-12 NOTE — Progress Notes (Signed)
ANTICOAGULATION CONSULT NOTE - Initial Consult  Pharmacy Consult for  COUMADIN (home med) Indication: atrial fibrillation  No Known Allergies  Patient Measurements: Height: 5\' 7"  (170.2 cm) Weight: 183 lb 13.8 oz (83.4 kg) IBW/kg (Calculated) : 61.6  Vital Signs: Temp: 97.6 F (36.4 C) (11/11 0725) Temp Source: Axillary (11/11 0725) BP: 101/59 (11/11 0900) Pulse Rate: 68 (11/11 0900)  Labs: Recent Labs    09/11/17 1919 09/11/17 2124 09/12/17 0357  HGB 12.2  --   --   HCT 35.5*  --   --   PLT 254  --   --   LABPROT 67.1*  --  76.7*  INR 8.10*  --  9.61*  CREATININE 2.80* 2.69* 2.24*  CKTOTAL  --  259*  --   TROPONINI 0.06* 0.06*  --    Estimated Creatinine Clearance: 24.1 mL/min (A) (by C-G formula based on SCr of 2.24 mg/dL (H)).  Medical History: Past Medical History:  Diagnosis Date  . Adenomatous polyps    -resected via colonoscopy in 2011  . Anemia   . Anxiety    MR  . Atrial fibrillation (HCC)    EF of 45%  . Degenerative joint disease    status post right total hip arthroplasty  . Dysrhythmia   . GERD (gastroesophageal reflux disease)    -Barrett's esophagus  . Glaucoma   . Glaucoma   . Gout   . Hyperlipidemia    Recent lipid profile is excellent without lipid-lowering therapy  . Hypertension   . Mental retardation   . Premature ventricular contractions   . Restless leg syndrome   . Shortness of breath dyspnea    Medications:  Medications Prior to Admission  Medication Sig Dispense Refill Last Dose  . allopurinol (ZYLOPRIM) 100 MG tablet Take 100 mg every evening by mouth.    09/10/2017 at Unknown time  . Brinzolamide-Brimonidine (SIMBRINZA) 1-0.2 % SUSP Place 1 drop into both eyes 2 (two) times daily.    09/11/2017 at Unknown time  . Cholecalciferol (VITAMIN D3) 5000 units CAPS Take 1 capsule by mouth daily.    09/11/2017 at Unknown time  . esomeprazole (NEXIUM) 40 MG capsule TAKE ONE CAPSULE BY MOUTH TWO TIMES DAILY 60 capsule 5 09/11/2017 at  Unknown time  . furosemide (LASIX) 40 MG tablet TAKE 1 TABLET BY MOUTH DAILY. MAY TAKE 1 EXTRA TABLET IF WEIGHT GAIN IS OVER 3 POUNDS OVERNIGHT (Patient taking differently: TAKE 1 TABLET BY MOUTH EVERY OTHER DAY) 90 tablet 2 09/11/2017 at Unknown time  . gabapentin (NEURONTIN) 300 MG capsule Take 300 mg at bedtime by mouth.   Past Week at Unknown time  . latanoprost (XALATAN) 0.005 % ophthalmic solution Place 1 drop into both eyes at bedtime.     09/10/2017 at Unknown time  . lisinopril (PRINIVIL,ZESTRIL) 20 MG tablet Take 1 tablet (20 mg total) by mouth 2 (two) times daily. 60 tablet 6 09/11/2017 at Unknown time  . loratadine (CLARITIN) 10 MG tablet Take 10 mg by mouth as needed for allergies.   UNKNOWN  . meclizine (ANTIVERT) 25 MG tablet Take 0.5 tablets by mouth 2 (two) times daily as needed.  0 09/07/2017 at Unknown time  . metoprolol (TOPROL-XL) 200 MG 24 hr tablet TAKE 1 TABLET BY MOUTH EVERY DAY 90 tablet 2 09/11/2017 at 800A  . neomycin-polymyxin-hydrocortisone (CORTISPORIN) 3.5-10000-1 otic suspension Place 2 drops 4 (four) times daily as needed into both ears.    UNKNOWN  . traMADol (ULTRAM) 50 MG tablet Take 1 tablet (  50 mg total) by mouth every 4 (four) hours as needed. For pain 90 tablet 0 09/11/2017 at [facility]  . warfarin (COUMADIN) 2 MG tablet Take 2 mg daily by mouth.    09/11/2017 at 800A   Assessment: INR > 9 on admission.  MD ordered Vitamin K.  HOLD coumadin for now, follow serial labs.   Goal of Therapy:  INR 2-3 Monitor platelets by anticoagulation protocol: Yes   Plan:  HOLD coumadin Monitor serial labs, INR, CBC  [name] A 09/12/2017,9:23 AM

## 2017-09-12 NOTE — Progress Notes (Signed)
*  PRELIMINARY RESULTS* Echocardiogram 2D Echocardiogram has been performed.  [name] 09/12/2017, 10:17 AM
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
## 2017-09-13 LAB — CBC
HEMATOCRIT: 30.7 % — AB (ref 36.0–46.0)
Hemoglobin: 10.2 g/dL — ABNORMAL LOW (ref 12.0–15.0)
MCH: 28.9 pg (ref 26.0–34.0)
MCHC: 33.2 g/dL (ref 30.0–36.0)
MCV: 87 fL (ref 78.0–100.0)
Platelets: 181 10*3/uL (ref 150–400)
RBC: 3.53 MIL/uL — ABNORMAL LOW (ref 3.87–5.11)
RDW: 17.6 % — AB (ref 11.5–15.5)
WBC: 6.4 10*3/uL (ref 4.0–10.5)

## 2017-09-13 LAB — RENAL FUNCTION PANEL
ANION GAP: 8 (ref 5–15)
Albumin: 2.6 g/dL — ABNORMAL LOW (ref 3.5–5.0)
BUN: 41 mg/dL — AB (ref 6–20)
CHLORIDE: 84 mmol/L — AB (ref 101–111)
CO2: 34 mmol/L — ABNORMAL HIGH (ref 22–32)
Calcium: 8.3 mg/dL — ABNORMAL LOW (ref 8.9–10.3)
Creatinine, Ser: 1.66 mg/dL — ABNORMAL HIGH (ref 0.44–1.00)
GFR calc Af Amer: 34 mL/min — ABNORMAL LOW (ref >60)
GFR, EST NON AFRICAN AMERICAN: 29 mL/min — AB (ref >60)
Glucose, Bld: 118 mg/dL — ABNORMAL HIGH (ref 65–99)
POTASSIUM: 3.1 mmol/L — AB (ref 3.5–5.1)
Phosphorus: 1.7 mg/dL — ABNORMAL LOW (ref 2.5–4.6)
Sodium: 126 mmol/L — ABNORMAL LOW (ref 135–145)

## 2017-09-13 LAB — PROTIME-INR
INR: 2.43
PROTHROMBIN TIME: 26.2 s — AB (ref 11.4–15.2)

## 2017-09-13 LAB — OSMOLALITY, URINE: OSMOLALITY UR: 324 mosm/kg (ref 300–900)

## 2017-09-13 LAB — CK: Total CK: 90 U/L (ref 38–234)

## 2017-09-13 LAB — COMPREHENSIVE METABOLIC PANEL
ALT: 16 U/L (ref 14–54)
ANION GAP: 8 (ref 5–15)
AST: 36 U/L (ref 15–41)
Albumin: 2.5 g/dL — ABNORMAL LOW (ref 3.5–5.0)
Alkaline Phosphatase: 129 U/L — ABNORMAL HIGH (ref 38–126)
BUN: 42 mg/dL — ABNORMAL HIGH (ref 6–20)
CO2: 35 mmol/L — ABNORMAL HIGH (ref 22–32)
Calcium: 8.4 mg/dL — ABNORMAL LOW (ref 8.9–10.3)
Chloride: 85 mmol/L — ABNORMAL LOW (ref 101–111)
Creatinine, Ser: 1.6 mg/dL — ABNORMAL HIGH (ref 0.44–1.00)
GFR calc Af Amer: 35 mL/min — ABNORMAL LOW (ref >60)
GFR, EST NON AFRICAN AMERICAN: 30 mL/min — AB (ref >60)
Glucose, Bld: 118 mg/dL — ABNORMAL HIGH (ref 65–99)
POTASSIUM: 3.3 mmol/L — AB (ref 3.5–5.1)
SODIUM: 128 mmol/L — AB (ref 135–145)
TOTAL PROTEIN: 5.6 g/dL — AB (ref 6.5–8.1)
Total Bilirubin: 1 mg/dL (ref 0.3–1.2)

## 2017-09-13 LAB — URINE CULTURE: Culture: NO GROWTH

## 2017-09-13 LAB — MAGNESIUM: Magnesium: 1.8 mg/dL (ref 1.7–2.4)

## 2017-09-13 MED ORDER — WARFARIN - PHARMACIST DOSING INPATIENT
Status: DC
  Administered 2017-09-13 – 2017-09-17 (×5)

## 2017-09-13 MED ORDER — WARFARIN SODIUM 1 MG PO TABS
1.0000 mg | ORAL_TABLET | Freq: Once | ORAL | Status: AC
  Administered 2017-09-13: 1 mg via ORAL
  Filled 2017-09-13: qty 1

## 2017-09-13 MED ORDER — METOPROLOL SUCCINATE ER 50 MG PO TB24
150.0000 mg | ORAL_TABLET | Freq: Every day | ORAL | Status: DC
  Administered 2017-09-14 – 2017-09-20 (×7): 150 mg via ORAL
  Filled 2017-09-13 (×7): qty 3
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
## 2017-09-13 NOTE — Progress Notes (Signed)
ANTICOAGULATION CONSULT NOTE - follow up  Pharmacy Consult for  COUMADIN (home med) Indication: atrial fibrillation  No Known Allergies  Patient Measurements: Height: 5\' 7"  (170.2 cm) Weight: 183 lb 13.8 oz (83.4 kg) IBW/kg (Calculated) : 61.6  Vital Signs: Temp: 98.1 F (36.7 C) (11/12 0800) Temp Source: Oral (11/12 0800) BP: 118/79 (11/12 0900) Pulse Rate: 70 (11/12 0800)  Labs: Recent Labs    09/11/17 1919 09/11/17 2124 09/12/17 0357 09/13/17 0442  HGB 12.2  --   --  10.2*  HCT 35.5*  --   --  30.7*  PLT 254  --   --  181  LABPROT 67.1*  --  76.7* 26.2*  INR 8.10*  --  9.61* 2.43  CREATININE 2.80* 2.69* 2.24* 1.66*  1.60*  CKTOTAL  --  259*  --  90  TROPONINI 0.06* 0.06*  --   --    Estimated Creatinine Clearance: 33.7 mL/min (A) (by C-G formula based on SCr of 1.6 mg/dL (H)).  Medical History: Past Medical History:  Diagnosis Date  . Adenomatous polyps    -resected via colonoscopy in 2011  . Anemia   . Anxiety    MR  . Atrial fibrillation (HCC)    EF of 45%  . Degenerative joint disease    status post right total hip arthroplasty  . Dysrhythmia   . GERD (gastroesophageal reflux disease)    -Barrett's esophagus  . Glaucoma   . Glaucoma   . Gout   . Hyperlipidemia    Recent lipid profile is excellent without lipid-lowering therapy  . Hypertension   . Mental retardation   . Premature ventricular contractions   . Restless leg syndrome   . Shortness of breath dyspnea    Medications:  Medications Prior to Admission  Medication Sig Dispense Refill Last Dose  . allopurinol (ZYLOPRIM) 100 MG tablet Take 100 mg every evening by mouth.    09/10/2017 at Unknown time  . Brinzolamide-Brimonidine (SIMBRINZA) 1-0.2 % SUSP Place 1 drop into both eyes 2 (two) times daily.    09/11/2017 at Unknown time  . Cholecalciferol (VITAMIN D3) 5000 units CAPS Take 1 capsule by mouth daily.    09/11/2017 at Unknown time  . esomeprazole (NEXIUM) 40 MG capsule TAKE ONE  CAPSULE BY MOUTH TWO TIMES DAILY 60 capsule 5 09/11/2017 at Unknown time  . furosemide (LASIX) 40 MG tablet TAKE 1 TABLET BY MOUTH DAILY. MAY TAKE 1 EXTRA TABLET IF WEIGHT GAIN IS OVER 3 POUNDS OVERNIGHT (Patient taking differently: TAKE 1 TABLET BY MOUTH EVERY OTHER DAY) 90 tablet 2 09/11/2017 at Unknown time  . gabapentin (NEURONTIN) 300 MG capsule Take 300 mg at bedtime by mouth.   Past Week at Unknown time  . latanoprost (XALATAN) 0.005 % ophthalmic solution Place 1 drop into both eyes at bedtime.     09/10/2017 at Unknown time  . lisinopril (PRINIVIL,ZESTRIL) 20 MG tablet Take 1 tablet (20 mg total) by mouth 2 (two) times daily. 60 tablet 6 09/11/2017 at Unknown time  . loratadine (CLARITIN) 10 MG tablet Take 10 mg by mouth as needed for allergies.   UNKNOWN  . meclizine (ANTIVERT) 25 MG tablet Take 0.5 tablets by mouth 2 (two) times daily as needed.  0 09/07/2017 at Unknown time  . metoprolol (TOPROL-XL) 200 MG 24 hr tablet TAKE 1 TABLET BY MOUTH EVERY DAY 90 tablet 2 09/11/2017 at 800A  . neomycin-polymyxin-hydrocortisone (CORTISPORIN) 3.5-10000-1 otic suspension Place 2 drops 4 (four) times daily as needed into both ears.  UNKNOWN  . traMADol (ULTRAM) 50 MG tablet Take 1 tablet (50 mg total) by mouth every 4 (four) hours as needed. For pain 90 tablet 0 09/11/2017 at [facility]  . warfarin (COUMADIN) 2 MG tablet Take 2 mg daily by mouth.    09/11/2017 at 800A   Assessment: INR > 9 on admission.  MD ordered Vitamin K on admission.  INR has responded nicely.  INR w/in therapeutic range today.  No bleeding noted.  INR response now will likely be sluggish due to Vitamin K effect.    Goal of Therapy:  INR 2-3 Monitor platelets by anticoagulation protocol: Yes   Plan:  Coumadin 1mg  today x 1 Monitor serial labs, INR, CBC Monitor for s/sx bleeding  Hart Robinsons A 09/13/2017,10:07 AM

## 2017-09-13 NOTE — Progress Notes (Signed)
[name]  MRN: [id number]  DOB/AGE: [birth year] 76 y.o.  Primary Care Physician:Gosrani, Doristine Johns, MD  Admit date: 09/11/2017  Chief Complaint:  Chief Complaint  Patient presents with  . Urinary Incontinence    S-Pt presented on  09/11/2017 with  Chief Complaint  Patient presents with  . Urinary Incontinence  .    Pt today feels better.pt offers no new concerns.Pt says " how am I doing ? Can I go home today?"  Meds . allopurinol  100 mg Oral QPM  . fluconazole  150 mg Oral Q3 days  . gabapentin  300 mg Oral QHS  . latanoprost  1 drop Both Eyes QHS  . mouth rinse  15 mL Mouth Rinse BID  . metoprolol  200 mg Oral Daily  . pantoprazole  80 mg Oral Q1200  . potassium chloride  40 mEq Oral Daily  . sodium chloride flush  3 mL Intravenous Q12H      Physical Exam: Vital signs in last 24 hours: Temp:  [97.5 F (36.4 C)-98.1 F (36.7 C)] 98.1 F (36.7 C) (11/12 0800) Pulse Rate:  [58-77] 70 (11/12 0800) Resp:  [11-25] 15 (11/12 0800) BP: (77-119)/(39-89) 80/41 (11/12 0800) SpO2:  [92 %-100 %] 96 % (11/12 0800) Weight change:  Last BM Date: 09/12/17  Intake/Output from previous day: 11/11 0701 - 11/12 0700 In: 1960 [I.V.:1960] Out: 1250 [Urine:1250] No intake/output data recorded.   Physical Exam: General- pt is awake,alert, follows commands Resp- No acute REsp distress, CTA B/L NO Rhonchi CVS- S1S2 regular ij rate and rhythm GIT- BS+, soft, NT, ND EXT- NO LE Edema, NO Cyanosis   Lab Results: CBC Recent Labs    09/11/17 1919 09/13/17 0442  WBC 9.8 6.4  HGB 12.2 10.2*  HCT 35.5* 30.7*  PLT 254 181    BMET Recent Labs    09/12/17 0357 09/13/17 0442  NA 121* 126*  128*  K 2.3* 3.1*  3.3*  CL 72* 84*  85*  CO2 35* 34*  35*  GLUCOSE 100* 118*  118*  BUN 58* 41*  42*  CREATININE 2.24* 1.66*  1.60*  CALCIUM 8.2* 8.3*  8.4*   Trend Creat 2018  2.8=>2.2=>1.6 2017  1.2--1.3 2013   1.8--2.1 2011    1.4 2010    1.3   MICRO Recent  Results (from the past 240 hour(s))  Urine culture     Status: None   Collection Time: 09/11/17  7:30 PM  Result Value Ref Range Status   Specimen Description URINE, CATHETERIZED  Final   Special Requests NONE  Final   Culture   Final    NO GROWTH Performed at [facility], [place] [address] [place], [place] [address]    Report Status 09/13/2017 FINAL  Final  MRSA PCR Screening     Status: None   Collection Time: 09/12/17  1:30 AM  Result Value Ref Range Status   MRSA by PCR NEGATIVE NEGATIVE Final    Comment:        The GeneXpert MRSA Assay (FDA approved for NASAL specimens only), is one component of a comprehensive MRSA colonization surveillance program. It is not intended to diagnose MRSA infection nor to guide or monitor treatment for MRSA infections.       Lab Results  Component Value Date   CALCIUM 8.4 (L) 09/13/2017   CALCIUM 8.3 (L) 09/13/2017   PHOS 1.7 (L) 09/13/2017  Impression: 1)Renal  AKI secondary to Prerenal/ATN                AKI sec to Hypovolemia/ACE on board                AKI on CKD               CKD stage 3.               CKD since 2010               CKD secondary to HTN/Multiple AKI                Progression of CKD now marked with AKI                Proteinura Absent .                Hematuria none.                Nephrolithiasis Hx Absent   2)HTN  BP on lower side  Medication-  On Beta blockers    3)Anemia HGb trending down Hx of Iron defcicny anemia Trending low sec to IVF   4)Hyponatremia    Hypovolemia hyponatremia and Hypokalemia    120=> 121=>128  5)Hypokalemia Sec to hypovolemia from diureyics On kcl 2.3=>3.3  6)CVs-hx of Afib  7)Acid base Co2 highMetabolic aklalosis Contraction alkalosis Sec to diuretics 41=> 35   Plan:  Will continue current care      [name] 09/13/2017, 9:00 AM

## 2017-09-13 NOTE — Plan of Care (Signed)
  Acute Rehab PT Goals(only PT should resolve) Pt Will Go Supine/Side To Sit 09/13/2017 1402 - Progressing by Lonell Grandchild, PT Flowsheets Taken 09/13/2017 1402  Pt will go Supine/Side to Sit with min guard assist Patient Will Transfer Sit To/From Stand 09/13/2017 1402 - Progressing by Lonell Grandchild, PT Flowsheets Taken 09/13/2017 1402  Patient will transfer sit to/from stand with modified independence Pt Will Transfer Bed To Chair/Chair To Bed 09/13/2017 1402 - Progressing by Lonell Grandchild, PT Flowsheets Taken 09/13/2017 1402  Pt will Transfer Bed to Chair/Chair to Bed with modified independence Pt Will Ambulate 09/13/2017 1402 - Progressing by Lonell Grandchild, PT Flowsheets Taken 09/13/2017 1402  Pt will Ambulate 25 feet;with minimal assist  2:04 PM, 09/13/17 Lonell Grandchild, MPT Physical Therapist with [facility] [phone] office [phone] mobile phone

## 2017-09-13 NOTE — Evaluation (Signed)
Occupational Therapy Evaluation Patient Details Name: [name] MRN: [id number] DOB: 07/07/1941 Today's Date: 09/13/2017    History of Present Illness [name] is a 76 y.o. female with a history of cognitive deficits, atrial fibrillation on chronic anticoagulation, GERD, glaucoma, hypertension, neuropathy.  Due to patient's mental retardation/cognitive deficits, the history obtained by sister who takes care of the patient.  Patient lives in a trailer in the sister's backyard and is fairly unsupervised.  Over the past couple of weeks, patient has had severe urinary incontinence occurring every time she stood up.   Clinical Impression   Patient is in bed with Niece present and agreeable to participate in evaluation. Niece is able to provide background information. Patient was able to complete more for herself and about 2 weeks ago the Niece began to notice a decline in her functional status. Patient presents with increased need for assistance during ADL completion and will benefit from skilled OT services while in acute to focus on deficits. Recommend SNF at discharge at this time unless her functional status improved greatly in which she will do fine with [facility] OT services.    Follow Up Recommendations  SNF    Equipment Recommendations  None recommended by OT       Precautions / Restrictions Precautions Precautions: Fall Precaution Comments: Due to recent decline in mobility.  Restrictions Weight Bearing Restrictions: No      Mobility Bed Mobility               General bed mobility comments: pt prefered to use bed rails and required min assist for rolling right and left during perineal care after BM.            ADL either performed or assessed with clinical judgement   ADL Overall ADL's : Needs assistance/impaired                 Upper Body Dressing : Sitting;Total assistance Upper Body Dressing Details (indicate cue type and reason): Hospital gown Lower  Body Dressing: Bed level;Total assistance Lower Body Dressing Details (indicate cue type and reason): donning socks Toilet Transfer: Min guard;Ambulation;+2 for safety/equipment;RW                   Vision Baseline Vision/History: (unknown)       Perception     Praxis      Pertinent Vitals/Pain Pain Assessment: No/denies pain     Hand Dominance Right   Extremity/Trunk Assessment Upper Extremity Assessment Upper Extremity Assessment: Overall WFL for tasks assessed   Lower Extremity Assessment Lower Extremity Assessment: Defer to PT evaluation       Communication Communication Communication: Other (comment)(Hx of cognitive deficits. Delayed carry over)   Cognition Arousal/Alertness: Awake/alert Behavior During Therapy: WFL for tasks assessed/performed Overall Cognitive Status: History of cognitive impairments - at baseline                                                Home Living Family/patient expects to be discharged to:: Skilled nursing facility Living Arrangements: Alone Available Help at Discharge: Family;Personal care attendant;Available 24 hours/day Type of Home: Mobile home Home Access: Ramped entrance     Home Layout: One level     Bathroom Shower/Tub: Teacher, early years/pre: Standard     Home Equipment: Environmental consultant - 2 wheels;Bedside commode;Shower seat;Hospital bed;Wheelchair -  manual   Additional Comments: Patient is never alone. Neice provides care every day 8-3pm.       Prior Functioning/Environment Level of Independence: Needs assistance  Gait / Transfers Assistance Needed: Patient uses walker for ambulation. ADL's / Homemaking Assistance Needed: Normally, patient is able to complete bathing and dressing with supervision to min assist.   Comments: Niece reports that 2 weeks ago, she began to notice a functional decline in her walking and ability to care for herself.         OT Problem List: Decreased  activity tolerance;Decreased safety awareness;Impaired balance (sitting and/or standing);Decreased knowledge of use of DME or AE;Decreased strength      OT Treatment/Interventions: Self-care/ADL training;Balance training;Therapeutic activities;DME and/or AE instruction;Patient/family education    OT Goals(Current goals can be found in the care plan section) Acute Rehab OT Goals Patient Stated Goal: To go home OT Goal Formulation: With patient/family Time For Goal Achievement: 09/27/17 Potential to Achieve Goals: Good  OT Frequency: Min 2X/week           Co-evaluation PT/OT/SLP Co-Evaluation/Treatment: Yes Reason for Co-Treatment: Necessary to address cognition/behavior during functional activity   OT goals addressed during session: ADL's and self-care;Strengthening/ROM      AM-PAC PT "6 Clicks" Daily Activity     Outcome Measure Help from another person eating meals?: A Little Help from another person taking care of personal grooming?: A Little Help from another person toileting, which includes using toliet, bedpan, or urinal?: Total Help from another person bathing (including washing, rinsing, drying)?: Total Help from another person to put on and taking off regular upper body clothing?: Total Help from another person to put on and taking off regular lower body clothing?: Total 6 Click Score: 10   End of Session Equipment Utilized During Treatment: Rolling walker  Activity Tolerance: Patient tolerated treatment well Patient left: in chair;with call bell/phone within reach;with family/visitor present  OT Visit Diagnosis: Muscle weakness (generalized) (M62.81)                Time: 7793-9030 OT Time Calculation (min): 31 min Charges:  OT General Charges $OT Visit: 1 Visit OT Evaluation $OT Eval Low Complexity: 1 Low G-Codes: OT G-codes **NOT FOR INPATIENT CLASS** Functional Assessment Tool Used: AM-PAC 6 Clicks Daily Activity Functional Limitation: Self care Self Care  Current Status (S9233): At least 60 percent but less than 80 percent impaired, limited or restricted Self Care Goal Status (A0762): At least 40 percent but less than 60 percent impaired, limited or restricted   Ailene Ravel, OTR/L,CBIS  [phone]   Kadeja Granada, Clarene Duke 09/13/2017, 9:36 AM

## 2017-09-13 NOTE — Evaluation (Signed)
Physical Therapy Evaluation Patient Details Name: [name] MRN: [id number] DOB: October 04, 1941 Today's Date: 09/13/2017   History of Present Illness  [name] is a 76 y.o. female with a history of cognitive deficits, atrial fibrillation on chronic anticoagulation, GERD, glaucoma, hypertension, neuropathy.  Due to patient's mental retardation/cognitive deficits, the history obtained by sister who takes care of the patient.  Patient lives in a trailer in the sister's backyard and is fairly unsupervised.  Over the past couple of weeks, patient has had severe urinary incontinence occurring every time she stood up.  A nurse from Dr. Lanice Shirts office came and picked up the urine on Thursday to rule out UTI.  Today, the patient asked to be taken to the doctor's office and was brought here via EMS for evaluation.  No palliating or provoking factors to the patient's urinary incontinence.  Currently, the patient was in wet shoes for the majority of the day.    Clinical Impression  Patient requires constant verbal/tactile cueing for following directions, limited to a few steps to transfer to chair due to weakness/poor standing balance, and tolerated sitting up in chair with her family member supervising - RN aware.  Patient will benefit from continued physical therapy in hospital and recommended venue below to increase strength, balance, endurance for safe ADLs and gait.    Follow Up Recommendations SNF;Supervision/Assistance - 24 hour    Equipment Recommendations  None recommended by PT    Recommendations for Other Services       Precautions / Restrictions Precautions Precautions: Fall Restrictions Weight Bearing Restrictions: No      Mobility  Bed Mobility Overal bed mobility: Needs Assistance Bed Mobility: Supine to Sit;Sit to Supine;Rolling Rolling: Min assist   Supine to sit: Min assist Sit to supine: Min assist   General bed mobility comments: slow labored movement with frequent  use of siderails  Transfers Overall transfer level: Needs assistance Equipment used: Rolling walker (2 wheeled) Transfers: Sit to/from [facility] Sit to Stand: Mod assist Stand pivot transfers: Mod assist          Ambulation/Gait Ambulation/Gait assistance: Mod assist Ambulation Distance (Feet): 3 Feet Assistive device: Rolling walker (2 wheeled) Gait Pattern/deviations: Decreased step length - right;Decreased step length - left;Decreased stride length   Gait velocity interpretation: Below normal speed for age/gender General Gait Details: limited to 6-7 small steps to transfer to chair secondary to BLE weakness and poor standing balance  Stairs            Wheelchair Mobility    Modified Rankin (Stroke Patients Only)       Balance Overall balance assessment: Needs assistance Sitting-balance support: Feet supported;Bilateral upper extremity supported Sitting balance-Leahy Scale: Fair     Standing balance support: Bilateral upper extremity supported;During functional activity Standing balance-Leahy Scale: Poor                               Pertinent Vitals/Pain Pain Assessment: Faces Faces Pain Scale: Hurts a little bit Pain Descriptors / Indicators: Guarding;Grimacing Pain Intervention(s): Limited activity within patient's tolerance;Monitored during session    [place] expects to be discharged to:: Private residence Living Arrangements: Alone Available Help at Discharge: Family;Personal care attendant Type of Home: Mobile home Home Access: Ramped entrance     Home Layout: One level Home Equipment: Environmental consultant - 2 wheels;Bedside commode;Wheelchair - manual;Shower seat;Hospital bed Additional Comments: Patient is never alone,  "per patient's neice", patient's  neice provide care from 8-3 pm    Prior Function Level of Independence: Needs assistance   Gait / Transfers Assistance Needed: Patient uses walker for  ambulation.           Hand Dominance   Dominant Hand: Right    Extremity/Trunk Assessment   Upper Extremity Assessment Upper Extremity Assessment: Defer to OT evaluation    Lower Extremity Assessment Lower Extremity Assessment: Generalized weakness    Cervical / Trunk Assessment Cervical / Trunk Assessment: Kyphotic  Communication   Communication: Other (comment)(patient easily distracted and requires frequent redirection to complete tasks)  Cognition Arousal/Alertness: Awake/alert Behavior During Therapy: Agitated;Anxious Overall Cognitive Status: History of cognitive impairments - at baseline                                        General Comments      Exercises     Assessment/Plan    PT Assessment Patient needs continued PT services  PT Problem List Decreased strength;Decreased activity tolerance;Decreased balance;Decreased mobility       PT Treatment Interventions Gait training;Functional mobility training;Therapeutic activities;Therapeutic exercise;Patient/family education    PT Goals (Current goals can be found in the Care Plan section)  Acute Rehab PT Goals Patient Stated Goal: To go home PT Goal Formulation: With patient/family Time For Goal Achievement: 09/20/17 Potential to Achieve Goals: Good    Frequency Min 3X/week   Barriers to discharge        Co-evaluation PT/OT/SLP Co-Evaluation/Treatment: Yes Reason for Co-Treatment: Necessary to address cognition/behavior during functional activity PT goals addressed during session: Mobility/safety with mobility;Strengthening/ROM;Balance;Proper use of DME         AM-PAC PT "6 Clicks" Daily Activity  Outcome Measure Difficulty turning over in bed (including adjusting bedclothes, sheets and blankets)?: A Lot Difficulty moving from lying on back to sitting on the side of the bed? : A Lot Difficulty sitting down on and standing up from a chair with arms (e.g., wheelchair, bedside  commode, etc,.)?: A Lot Help needed moving to and from a bed to chair (including a wheelchair)?: A Lot Help needed walking in hospital room?: A Lot Help needed climbing 3-5 steps with a railing? : Total 6 Click Score: 11    End of Session Equipment Utilized During Treatment: Oxygen Activity Tolerance: Patient limited by fatigue Patient left: in chair;with call bell/phone within reach;with family/visitor present Nurse Communication: Mobility status PT Visit Diagnosis: Unsteadiness on feet (R26.81);Other abnormalities of gait and mobility (R26.89);Muscle weakness (generalized) (M62.81)    Time: 4259-5638 PT Time Calculation (min) (ACUTE ONLY): 36 min   Charges:   PT Evaluation $PT Eval Moderate Complexity: 1 Mod PT Treatments $Therapeutic Activity: 8-22 mins   PT G Codes:   PT G-Codes **NOT FOR INPATIENT CLASS** Functional Assessment Tool Used: AM-PAC 6 Clicks Basic Mobility Functional Limitation: Mobility: Walking and moving around Mobility: Walking and Moving Around Current Status (V5643): At least 60 percent but less than 80 percent impaired, limited or restricted Mobility: Walking and Moving Around Goal Status [phone]): At least 60 percent but less than 80 percent impaired, limited or restricted Mobility: Walking and Moving Around Discharge Status [phone]): At least 60 percent but less than 80 percent impaired, limited or restricted    2:00 PM, 09/13/17 Lonell Grandchild, MPT Physical Therapist with [facility] [phone] office [phone] mobile phone

## 2017-09-14 LAB — CBC
HCT: 30.9 % — ABNORMAL LOW (ref 36.0–46.0)
HEMOGLOBIN: 10.1 g/dL — AB (ref 12.0–15.0)
MCH: 28.7 pg (ref 26.0–34.0)
MCHC: 32.7 g/dL (ref 30.0–36.0)
MCV: 87.8 fL (ref 78.0–100.0)
Platelets: 213 10*3/uL (ref 150–400)
RBC: 3.52 MIL/uL — ABNORMAL LOW (ref 3.87–5.11)
RDW: 17.8 % — AB (ref 11.5–15.5)
WBC: 7.6 10*3/uL (ref 4.0–10.5)

## 2017-09-14 LAB — COMPREHENSIVE METABOLIC PANEL
ALBUMIN: 2.6 g/dL — AB (ref 3.5–5.0)
ALT: 17 U/L (ref 14–54)
ANION GAP: 6 (ref 5–15)
AST: 29 U/L (ref 15–41)
Alkaline Phosphatase: 128 U/L — ABNORMAL HIGH (ref 38–126)
BUN: 29 mg/dL — AB (ref 6–20)
CO2: 31 mmol/L (ref 22–32)
Calcium: 8.5 mg/dL — ABNORMAL LOW (ref 8.9–10.3)
Chloride: 91 mmol/L — ABNORMAL LOW (ref 101–111)
Creatinine, Ser: 1.32 mg/dL — ABNORMAL HIGH (ref 0.44–1.00)
GFR calc Af Amer: 45 mL/min — ABNORMAL LOW (ref >60)
GFR, EST NON AFRICAN AMERICAN: 38 mL/min — AB (ref >60)
GLUCOSE: 96 mg/dL (ref 65–99)
POTASSIUM: 4.2 mmol/L (ref 3.5–5.1)
Sodium: 128 mmol/L — ABNORMAL LOW (ref 135–145)
TOTAL PROTEIN: 5.6 g/dL — AB (ref 6.5–8.1)
Total Bilirubin: 1 mg/dL (ref 0.3–1.2)

## 2017-09-14 LAB — PROTIME-INR
INR: 1.33
PROTHROMBIN TIME: 16.4 s — AB (ref 11.4–15.2)

## 2017-09-14 LAB — MAGNESIUM: MAGNESIUM: 1.5 mg/dL — AB (ref 1.7–2.4)

## 2017-09-14 MED ORDER — SODIUM CHLORIDE 0.9% FLUSH
10.0000 mL | Freq: Two times a day (BID) | INTRAVENOUS | Status: DC
  Administered 2017-09-16: 20 mL
  Administered 2017-09-17: 10 mL
  Administered 2017-09-17 – 2017-09-18 (×2): 20 mL
  Administered 2017-09-19: 10 mL
  Administered 2017-09-19: 40 mL

## 2017-09-14 MED ORDER — DIPHENHYDRAMINE HCL 25 MG PO CAPS
25.0000 mg | ORAL_CAPSULE | Freq: Once | ORAL | Status: DC
  Filled 2017-09-14 (×2): qty 1

## 2017-09-14 MED ORDER — CHLORHEXIDINE GLUCONATE CLOTH 2 % EX PADS
6.0000 | MEDICATED_PAD | Freq: Every day | CUTANEOUS | Status: DC
  Administered 2017-09-14 – 2017-09-17 (×3): 6 via TOPICAL

## 2017-09-14 MED ORDER — WARFARIN SODIUM 2 MG PO TABS
3.0000 mg | ORAL_TABLET | Freq: Once | ORAL | Status: AC
  Administered 2017-09-14: 17:00:00 3 mg via ORAL
  Filled 2017-09-14: qty 1

## 2017-09-14 MED ORDER — MAGNESIUM SULFATE 50 % IJ SOLN
3.0000 g | Freq: Once | INTRAVENOUS | Status: AC
  Administered 2017-09-14: 3 g via INTRAVENOUS
  Filled 2017-09-14: qty 6

## 2017-09-14 MED ORDER — SODIUM CHLORIDE 0.9 % IV SOLN
INTRAVENOUS | Status: DC
  Administered 2017-09-14: 08:00:00 via INTRAVENOUS

## 2017-09-14 MED ORDER — FUROSEMIDE 20 MG PO TABS
20.0000 mg | ORAL_TABLET | Freq: Every day | ORAL | Status: DC
  Administered 2017-09-14 – 2017-09-15 (×2): 20 mg via ORAL
  Filled 2017-09-14 (×2): qty 1

## 2017-09-14 MED ORDER — SODIUM CHLORIDE 0.9% FLUSH: 10.0000 mL | INTRAVENOUS | Status: DC | PRN

## 2017-09-14 NOTE — Progress Notes (Signed)
Subjective: Interval History: has no complaint of nausea or vomiting.  Patient also denies any difficulty breathing.  Her appetite is good.  Objective: Vital signs in last 24 hours: Temp:  [97.4 F (36.3 C)-98.2 F (36.8 C)] 97.6 F (36.4 C) (11/13 0400) Pulse Rate:  [62-208] 96 (11/13 0755) Resp:  [12-23] 17 (11/13 0755) BP: (75-123)/(48-84) 103/56 (11/13 0400) SpO2:  [86 %-100 %] 100 % (11/13 0755) Weight:  [86.7 kg (191 lb 2.2 oz)] 86.7 kg (191 lb 2.2 oz) (11/13 0500) Weight change:   Intake/Output from previous day: 11/12 0701 - 11/13 0700 In: 3109.6 [P.O.:1230; I.V.:1879.6] Out: 400 [Urine:400] Intake/Output this shift: No intake/output data recorded. Generally patient is alert and in no apparent distress Chest: Decreased breath sound bilaterally, no rales or rhonchi Heart exam irevealed regular rate and rhythm no murmur Extremities she has trace to 1+ edema  Lab Results: Recent Labs    09/13/17 0442 09/14/17 0410  WBC 6.4 7.6  HGB 10.2* 10.1*  HCT 30.7* 30.9*  PLT 181 213   BMET:  Recent Labs    09/13/17 0442 09/14/17 0410  NA 126*  128* 128*  K 3.1*  3.3* 4.2  CL 84*  85* 91*  CO2 34*  35* 31  GLUCOSE 118*  118* 96  BUN 41*  42* 29*  CREATININE 1.66*  1.60* 1.32*  CALCIUM 8.3*  8.4* 8.5*   No results for input(s): PTH in the last 72 hours. Iron Studies: No results for input(s): IRON, TIBC, TRANSFERRIN, FERRITIN in the last 72 hours.  Studies/Results: US Renal  Result Date: 09/12/2017 CLINICAL DATA:  Chronic renal failure, stage III, with acute exacerbation EXAM: RENAL / URINARY TRACT ULTRASOUND COMPLETE COMPARISON:  None. FINDINGS: Right Kidney: Length: 8.4 cm. Echogenicity within normal limits. There is renal cortical thinning. No mass, perinephric fluid, or hydronephrosis visualized. No sonographically demonstrable calculus or ureterectasis. Left Kidney: Length: 8.9 cm. Echogenicity within normal limits there is renal cortical thinning. No  mass, perinephric fluid, or hydronephrosis visualized. No sonographically demonstrable calculus or ureterectasis. Bladder: Appears normal for degree of bladder distention. IMPRESSION: Kidneys bilaterally are small with renal cortical thinning. These are findings that may be seen with medical renal disease. The renal echogenicity is within normal limits bilaterally. No obstructing foci identified in either kidney. Electronically Signed   By: Lowella Grip III M.D.   On: 09/12/2017 13:55   Dg Chest Port 1 View  Result Date: 09/12/2017 CLINICAL DATA:  Central catheter placement EXAM: PORTABLE CHEST 1 VIEW COMPARISON:  September 11, 2017 FINDINGS: Peripherally inserted central catheter tip is at the cavoatrial junction. No pneumothorax. There is airspace consolidation in the left lower lobe with small left pleural effusion. There is underlying cardiomegaly with pulmonary venous hypertension. There is mild interstitial edema. No evident bone lesions. No adenopathy. IMPRESSION: Central catheter tip at cavoatrial junction.  No pneumothorax. Cardiomegaly with pulmonary venous hypertension consistent with pulmonary vascular congestion. Mild interstitial edema with small left pleural effusion. Left lower lobe airspace consolidation, felt to be most likely due to pneumonia. Followup PA and lateral chest radiographs recommended in 3-4 weeks following trial of antibiotic therapy to ensure resolution and exclude underlying malignancy. Electronically Signed   By: Lowella Grip III M.D.   On: 09/12/2017 13:57    I have reviewed the patient's current medications.  Assessment/Plan: 1] acute kidney injury superimposed on chronic presently her renal function is improving.  Her creatinine is 1.32 and has returned to her baseline. 2]chronic renal failure stage III.  3] hyponatremia: Thought to be secondary to hypovolemic hyponatremia.  Her sodium is 128 improving. 4] hypokalemia: Patient on potassium supplement and  her potassium has corrected 5] hypertension: Her blood pressure is reasonably controlled 6] bone and mineral disorder: Her calcium and phosphorus is range 7] history of CHF Plan: We will DC IV fluid 2] we will start her on Lasix 20 mg p.o. once a day 3] we will check her renal panel in the morning.   LOS: 3 days   Apolo Cutshaw S 09/14/2017,8:14 AM

## 2017-09-14 NOTE — Progress Notes (Signed)
Patient is telemetry and is transferring to Dept 300, report called and given to [place], Therapist, sports.

## 2017-09-14 NOTE — Progress Notes (Signed)
ANTICOAGULATION CONSULT NOTE - follow up  Pharmacy Consult for  COUMADIN (home med) Indication: atrial fibrillation  No Known Allergies  Patient Measurements: Height: 5\' 7"  (170.2 cm) Weight: 191 lb 2.2 oz (86.7 kg) IBW/kg (Calculated) : 61.6  Vital Signs: Temp: 97.7 F (36.5 C) (11/13 0800) Temp Source: Oral (11/13 0800) BP: 137/101 (11/13 0800) Pulse Rate: 82 (11/13 0800)  Labs: Recent Labs    09/11/17 1919 09/11/17 2124 09/12/17 0357 09/13/17 0442 09/14/17 0410  HGB 12.2  --   --  10.2* 10.1*  HCT 35.5*  --   --  30.7* 30.9*  PLT 254  --   --  181 213  LABPROT 67.1*  --  76.7* 26.2* 16.4*  INR 8.10*  --  9.61* 2.43 1.33  CREATININE 2.80* 2.69* 2.24* 1.66*  1.60* 1.32*  CKTOTAL  --  259*  --  90  --   TROPONINI 0.06* 0.06*  --   --   --    Estimated Creatinine Clearance: 41.6 mL/min (A) (by C-G formula based on SCr of 1.32 mg/dL (H)).  Medical History: Past Medical History:  Diagnosis Date  . Adenomatous polyps    -resected via colonoscopy in 2011  . Anemia   . Anxiety    MR  . Atrial fibrillation (HCC)    EF of 45%  . Degenerative joint disease    status post right total hip arthroplasty  . Dysrhythmia   . GERD (gastroesophageal reflux disease)    -Barrett's esophagus  . Glaucoma   . Glaucoma   . Gout   . Hyperlipidemia    Recent lipid profile is excellent without lipid-lowering therapy  . Hypertension   . Mental retardation   . Premature ventricular contractions   . Restless leg syndrome   . Shortness of breath dyspnea    Medications:  Medications Prior to Admission  Medication Sig Dispense Refill Last Dose  . allopurinol (ZYLOPRIM) 100 MG tablet Take 100 mg every evening by mouth.    09/10/2017 at Unknown time  . Brinzolamide-Brimonidine (SIMBRINZA) 1-0.2 % SUSP Place 1 drop into both eyes 2 (two) times daily.    09/11/2017 at Unknown time  . Cholecalciferol (VITAMIN D3) 5000 units CAPS Take 1 capsule by mouth daily.    09/11/2017 at Unknown  time  . esomeprazole (NEXIUM) 40 MG capsule TAKE ONE CAPSULE BY MOUTH TWO TIMES DAILY 60 capsule 5 09/11/2017 at Unknown time  . furosemide (LASIX) 40 MG tablet TAKE 1 TABLET BY MOUTH DAILY. MAY TAKE 1 EXTRA TABLET IF WEIGHT GAIN IS OVER 3 POUNDS OVERNIGHT (Patient taking differently: TAKE 1 TABLET BY MOUTH EVERY OTHER DAY) 90 tablet 2 09/11/2017 at Unknown time  . gabapentin (NEURONTIN) 300 MG capsule Take 300 mg at bedtime by mouth.   Past Week at Unknown time  . latanoprost (XALATAN) 0.005 % ophthalmic solution Place 1 drop into both eyes at bedtime.     09/10/2017 at Unknown time  . lisinopril (PRINIVIL,ZESTRIL) 20 MG tablet Take 1 tablet (20 mg total) by mouth 2 (two) times daily. 60 tablet 6 09/11/2017 at Unknown time  . loratadine (CLARITIN) 10 MG tablet Take 10 mg by mouth as needed for allergies.   UNKNOWN  . meclizine (ANTIVERT) 25 MG tablet Take 0.5 tablets by mouth 2 (two) times daily as needed.  0 09/07/2017 at Unknown time  . metoprolol (TOPROL-XL) 200 MG 24 hr tablet TAKE 1 TABLET BY MOUTH EVERY DAY 90 tablet 2 09/11/2017 at 800A  . neomycin-polymyxin-hydrocortisone (CORTISPORIN) 3.5-10000-1  otic suspension Place 2 drops 4 (four) times daily as needed into both ears.    UNKNOWN  . traMADol (ULTRAM) 50 MG tablet Take 1 tablet (50 mg total) by mouth every 4 (four) hours as needed. For pain 90 tablet 0 09/11/2017 at [facility]  . warfarin (COUMADIN) 2 MG tablet Take 2 mg daily by mouth.    09/11/2017 at 800A   Assessment: INR > 9 on admission.  MD ordered Vitamin K on admission.  INR has responded nicely.  INR now below goal  No bleeding noted.  INR response now will likely be sluggish due to Vitamin K effect.    Goal of Therapy:  INR 2-3 Monitor platelets by anticoagulation protocol: Yes   Plan:  Coumadin 3mg  today x 1 Monitor serial labs, INR, CBC Monitor for s/sx bleeding  Pricilla Larsson 09/14/2017,11:56 AM

## 2017-09-14 NOTE — Plan of Care (Signed)
Pt stated her legs were painful. PRN pain medication relieved pain. Explained to patient the use of her call bell.

## 2017-09-14 NOTE — Clinical Social Work Note (Signed)
Clinical Social Work Assessment  Patient Details  Name: [name] MRN: [id number] Date of Birth: [birth year]  Date of referral:  09/14/17               Reason for consult:  Facility Placement, Guardianship Needs                Permission sought to share information with:    Permission granted to share information::     Name::        Agency::     Relationship::     Contact Information:     Housing/Transportation Living arrangements for the past 2 months:  Mobile Home Source of Information:  Other (Comment Required) Patient Interpreter Needed:  None Criminal Activity/Legal Involvement Pertinent to Current Situation/Hospitalization:    Significant Relationships:  Siblings, Other Family Members Lives with:  Self Do you feel safe going back to the place where you live?  Yes Need for family participation in patient care:  Yes (Comment)  Care giving concerns: Concern about amount of care needed at home. Niece currently is the caregiver. She feels pt will need SNF rehab.   Social Worker assessment / plan: Pt is a 76 year old female admitted with hypokalemia. Pt appears to have cognitive delay according to the chart notes. Pt's niece in the room with pt at the time of LCSW visit today. Niece reports that she is pt's paid caregiver. She is with pt from 8-3 seven days a week. She has been caring for pt for three years. Pt lives in a mobile home on the property belonging to her sister. Pt's sister and brother check in on her frequently per niece. Pt has a w/c, shower chair, commode, and hospital bed for DME. Pt's niece states that pt's mother was pt's guardian until she died. No one is pt's official guardian right now. Pt's sister handles pt's money and other needs. Discussed SNF rehab with niece and pt. Referrals are requested to the facilities in [place]. Will start on PASRR and referrals. Pt indicates she would eventually like to dc back to home after SNF rehab. Pt's niece expresses that this is  the family's wish as well. Will investigate whether [place] are involved and see if they can further assess if pt has appropriate in home care to make this happen. Will follow.  Employment status:  Retired Forensic scientist:  Information systems manager, Medicaid In [place] PT Recommendations:  [place] / Referral to community resources:     Patient/Family's Response to care: Pt and family are agreeable to care.  Patient/Family's Understanding of and Emotional Response to Diagnosis, Current Treatment, and Prognosis: Pt and family understand diagnosis and treatment. They are receptive to SW involvement and agree that short term rehab will be needed.  Emotional Assessment Appearance:  Appears stated age Attitude/Demeanor/Rapport:  Complaining Affect (typically observed):  Irritable Orientation:  Oriented to Self, Oriented to Place, Oriented to Situation Alcohol / Substance use:    Psych involvement (Current and /or in the community):     Discharge Needs  Concerns to be addressed:  Discharge Planning Concerns, Home Safety Concerns Readmission within the last 30 days:  No Current discharge risk:  Cognitively Impaired Barriers to Discharge:  [place] ([place])   Shade Flood, LCSW 09/14/2017, 12:07 PM
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
## 2017-09-14 NOTE — Consult Note (Signed)
   [facility] CM Inpatient Consult   09/14/2017  IFE VITELLI 1941-06-14 [id number]   Patient screened for potential [place] Management services. Patient is on the [facility] registry as a benefit of their Next Gen medicare . Electronic medical record reveals patient's discharge plan is SNF and there were no identifiable [facility] care management needs. [facility] Care Management services not appropriate at this time. If patient's post hospital needs change please place a [facility] Care Management consult. For questions please contact:   Takeem Krotzer RN, [facility] Liaison  [phone]) Business Mobile [phone]) Toll free office

## 2017-09-15 DIAGNOSIS — N189 Chronic kidney disease, unspecified: Secondary | ICD-10-CM

## 2017-09-15 LAB — COMPREHENSIVE METABOLIC PANEL
ALBUMIN: 2.8 g/dL — AB (ref 3.5–5.0)
ALK PHOS: 158 U/L — AB (ref 38–126)
ALT: 19 U/L (ref 14–54)
AST: 30 U/L (ref 15–41)
Anion gap: 9 (ref 5–15)
BUN: 22 mg/dL — AB (ref 6–20)
CALCIUM: 8.9 mg/dL (ref 8.9–10.3)
CO2: 31 mmol/L (ref 22–32)
Chloride: 87 mmol/L — ABNORMAL LOW (ref 101–111)
Creatinine, Ser: 1.38 mg/dL — ABNORMAL HIGH (ref 0.44–1.00)
GFR calc Af Amer: 42 mL/min — ABNORMAL LOW (ref >60)
GFR calc non Af Amer: 36 mL/min — ABNORMAL LOW (ref >60)
GLUCOSE: 89 mg/dL (ref 65–99)
Potassium: 3.7 mmol/L (ref 3.5–5.1)
SODIUM: 127 mmol/L — AB (ref 135–145)
Total Bilirubin: 1.4 mg/dL — ABNORMAL HIGH (ref 0.3–1.2)
Total Protein: 5.9 g/dL — ABNORMAL LOW (ref 6.5–8.1)

## 2017-09-15 LAB — CBC
HEMATOCRIT: 31.7 % — AB (ref 36.0–46.0)
HEMOGLOBIN: 10.5 g/dL — AB (ref 12.0–15.0)
MCH: 29 pg (ref 26.0–34.0)
MCHC: 33.1 g/dL (ref 30.0–36.0)
MCV: 87.6 fL (ref 78.0–100.0)
Platelets: 208 10*3/uL (ref 150–400)
RBC: 3.62 MIL/uL — ABNORMAL LOW (ref 3.87–5.11)
RDW: 17.9 % — AB (ref 11.5–15.5)
WBC: 7.4 10*3/uL (ref 4.0–10.5)

## 2017-09-15 LAB — PROTIME-INR
INR: 1.47
PROTHROMBIN TIME: 17.7 s — AB (ref 11.4–15.2)

## 2017-09-15 LAB — MAGNESIUM: Magnesium: 1.7 mg/dL (ref 1.7–2.4)

## 2017-09-15 LAB — BASIC METABOLIC PANEL
ANION GAP: 12 (ref 5–15)
BUN: 19 mg/dL (ref 6–20)
CO2: 26 mmol/L (ref 22–32)
Calcium: 8.9 mg/dL (ref 8.9–10.3)
Chloride: 85 mmol/L — ABNORMAL LOW (ref 101–111)
Creatinine, Ser: 1.39 mg/dL — ABNORMAL HIGH (ref 0.44–1.00)
GFR, EST AFRICAN AMERICAN: 42 mL/min — AB (ref >60)
GFR, EST NON AFRICAN AMERICAN: 36 mL/min — AB (ref >60)
GLUCOSE: 103 mg/dL — AB (ref 65–99)
POTASSIUM: 3.5 mmol/L (ref 3.5–5.1)
Sodium: 123 mmol/L — ABNORMAL LOW (ref 135–145)

## 2017-09-15 LAB — PHOSPHORUS: PHOSPHORUS: 1.5 mg/dL — AB (ref 2.5–4.6)

## 2017-09-15 MED ORDER — PANTOPRAZOLE SODIUM 40 MG PO TBEC
40.0000 mg | DELAYED_RELEASE_TABLET | Freq: Every day | ORAL | Status: DC
  Administered 2017-09-16: 40 mg via ORAL
  Filled 2017-09-15 (×2): qty 1

## 2017-09-15 MED ORDER — POTASSIUM PHOSPHATE MONOBASIC 500 MG PO TABS
500.0000 mg | ORAL_TABLET | Freq: Three times a day (TID) | ORAL | Status: DC
  Filled 2017-09-15: qty 1

## 2017-09-15 MED ORDER — SODIUM CHLORIDE 0.9 % IV BOLUS (SEPSIS)
500.0000 mL | Freq: Once | INTRAVENOUS | Status: AC
  Administered 2017-09-15: 500 mL via INTRAVENOUS

## 2017-09-15 MED ORDER — TRAZODONE HCL 50 MG PO TABS
50.0000 mg | ORAL_TABLET | Freq: Every day | ORAL | Status: DC
  Administered 2017-09-15: 50 mg via ORAL
  Filled 2017-09-15: qty 1

## 2017-09-15 MED ORDER — MAGNESIUM SULFATE 4 GM/100ML IV SOLN
4.0000 g | Freq: Once | INTRAVENOUS | Status: AC
  Administered 2017-09-15: 4 g via INTRAVENOUS
  Filled 2017-09-15: qty 100

## 2017-09-15 MED ORDER — K PHOS MONO-SOD PHOS DI & MONO 155-852-130 MG PO TABS
500.0000 mg | ORAL_TABLET | Freq: Three times a day (TID) | ORAL | Status: DC
  Administered 2017-09-15 – 2017-09-16 (×7): 500 mg via ORAL
  Filled 2017-09-15 (×21): qty 2

## 2017-09-15 MED ORDER — TRAZODONE HCL 50 MG PO TABS: 100.0000 mg | ORAL_TABLET | Freq: Every day | ORAL | Status: DC

## 2017-09-15 MED ORDER — WARFARIN SODIUM 2.5 MG PO TABS
2.5000 mg | ORAL_TABLET | Freq: Once | ORAL | Status: AC
  Administered 2017-09-15: 2.5 mg via ORAL
  Filled 2017-09-15: qty 1

## 2017-09-15 MED ORDER — ENOXAPARIN SODIUM 40 MG/0.4ML ~~LOC~~ SOLN
40.0000 mg | SUBCUTANEOUS | Status: DC
  Administered 2017-09-15 – 2017-09-16 (×2): 40 mg via SUBCUTANEOUS
  Filled 2017-09-15 (×2): qty 0.4

## 2017-09-15 NOTE — Progress Notes (Signed)
Physical Therapy Treatment Patient Details Name: [name] MRN: [id number] DOB: 10-02-1941 Today's Date: 09/15/2017    History of Present Illness [name] is a 76 y.o. female with a history of cognitive deficits, atrial fibrillation on chronic anticoagulation, GERD, glaucoma, hypertension, neuropathy.  Due to patient's mental retardation/cognitive deficits, the history obtained by sister who takes care of the patient.  Patient lives in a trailer in the sister's backyard and is fairly unsupervised.  Over the past couple of weeks, patient has had severe urinary incontinence occurring every time she stood up.  A nurse from Dr. Lanice Shirts office came and picked up the urine on Thursday to rule out UTI.  Today, the patient asked to be taken to the doctor's office and was brought here via EMS for evaluation.  No palliating or provoking factors to the patient's urinary incontinence.  Currently, the patient was in wet shoes for the majority of the day.    PT Comments    Patient requires much verbal/tactile cueing to follow directions, her family members present and assisted with motivating patient.  Patient has difficulty with sit to stands and limited for taking steps secondary to BLE weakness and c/o fatigue.  Patient's O2 saturation at 96-98% on room air and taken off supplemental 1 LPM O2 per MD's request.  Patient will benefit from continued physical therapy in hospital and recommended venue below to increase strength, balance, endurance for safe ADLs and gait.   Follow Up Recommendations  SNF;Supervision/Assistance - 24 hour     Equipment Recommendations  None recommended by PT    Recommendations for Other Services       Precautions / Restrictions Precautions Precautions: Fall Restrictions Weight Bearing Restrictions: No    Mobility  Bed Mobility               General bed mobility comments: Patient present up in chair (assisted by OT)  Transfers Overall transfer level:  Needs assistance Equipment used: Rolling walker (2 wheeled) Transfers: Sit to/from [facility] Sit to Stand: Mod assist Stand pivot transfers: Mod assist          Ambulation/Gait Ambulation/Gait assistance: Mod assist Ambulation Distance (Feet): 12 Feet Assistive device: Rolling walker (2 wheeled) Gait Pattern/deviations: Decreased step length - right;Decreased step length - left;Decreased stride length   Gait velocity interpretation: Below normal speed for age/gender General Gait Details: Patient demonstrates slow unsteady labored cadence with dragging of feet once fatigued, followed with chair for safety, limited secondary to c/o fatigue   Stairs            Wheelchair Mobility    Modified Rankin (Stroke Patients Only)       Balance Overall balance assessment: Needs assistance Sitting-balance support: Feet supported;Bilateral upper extremity supported Sitting balance-Leahy Scale: Fair     Standing balance support: Bilateral upper extremity supported;During functional activity Standing balance-Leahy Scale: Poor                              Cognition Arousal/Alertness: Awake/alert Behavior During Therapy: Agitated;Anxious Overall Cognitive Status: History of cognitive impairments - at baseline                                        Exercises General Exercises - Lower Extremity Long Arc Quad: Seated;AROM;Strengthening;Both;15 reps Hip Flexion/Marching: Seated;AROM;Strengthening;Both;15 reps    General Comments  Pertinent Vitals/Pain Pain Assessment: No/denies pain    Home Living                      Prior Function            PT Goals (current goals can now be found in the care plan section) Acute Rehab PT Goals Patient Stated Goal: To go home PT Goal Formulation: With patient Time For Goal Achievement: 09/20/17 Potential to Achieve Goals: Good Progress towards PT goals: Progressing  toward goals    Frequency    Min 3X/week      PT Plan Current plan remains appropriate    Co-evaluation              AM-PAC PT "6 Clicks" Daily Activity  Outcome Measure  Difficulty turning over in bed (including adjusting bedclothes, sheets and blankets)?: A Lot Difficulty moving from lying on back to sitting on the side of the bed? : A Lot Difficulty sitting down on and standing up from a chair with arms (e.g., wheelchair, bedside commode, etc,.)?: A Lot   Help needed walking in hospital room?: A Lot Help needed climbing 3-5 steps with a railing? : Total 6 Click Score: 9    End of Session Equipment Utilized During Treatment: Gait belt Activity Tolerance: Patient limited by fatigue Patient left: in chair;with call bell/phone within reach;with family/visitor present Nurse Communication: Mobility status PT Visit Diagnosis: Unsteadiness on feet (R26.81);Other abnormalities of gait and mobility (R26.89);Muscle weakness (generalized) (M62.81)     Time: 5009-3818 PT Time Calculation (min) (ACUTE ONLY): 33 min  Charges:  $Therapeutic Activity: 23-37 mins                    G Codes:       1:39 PM, 09-19-2017 Lonell Grandchild, MPT Physical Therapist with [facility] [phone] office [phone] mobile phone
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
## 2017-09-15 NOTE — Progress Notes (Signed)
ANTICOAGULATION CONSULT NOTE - follow up  Pharmacy Consult for  COUMADIN (home med) Indication: atrial fibrillation  No Known Allergies  Patient Measurements: Height: 5\' 7"  (170.2 cm) Weight: 191 lb 2.2 oz (86.7 kg) IBW/kg (Calculated) : 61.6  Vital Signs:    Labs: Recent Labs    09/13/17 0442 09/14/17 0410 09/15/17 0602  HGB 10.2* 10.1* 10.5*  HCT 30.7* 30.9* 31.7*  PLT 181 213 208  LABPROT 26.2* 16.4* 17.7*  INR 2.43 1.33 1.47  CREATININE 1.66*  1.60* 1.32* 1.38*  CKTOTAL 90  --   --    Estimated Creatinine Clearance: 39.8 mL/min (A) (by C-G formula based on SCr of 1.38 mg/dL (H)).  Assessment: INR > 9 on admission.  MD ordered Vitamin K on admission.  INR has responded nicely.  INR now below goal  No bleeding noted.  INR response now will likely be sluggish due to Vitamin K effect.    Goal of Therapy:  INR 2-3 Monitor platelets by anticoagulation protocol: Yes   Plan:  Coumadin 2.5 mg today x 1 Monitor serial labs, INR, CBC Monitor for s/sx bleeding  Pricilla Larsson 09/15/2017,10:21 AM

## 2017-09-15 NOTE — Progress Notes (Signed)
PROGRESS NOTE    [name]   HAL:[id number]  DOB: 01-02-1941  DOA: 09/11/2017 PCP: Doree Albee, MD   Brief Narrative:  [name] a 76 y.o.femalewith a history of cognitive deficits, atrial fibrillation on chronic anticoagulation, GERD, glaucoma, hypertension,neuropathy. Due to patient'sintellectual disability/cognitive deficits, the history obtained by sister who takes care of the patient. Patient lives in a trailer in the sister's backyard and is fairly unsupervised. Over the past couple of weeks, patient has had severe urinary incontinence occurring every time she stood up and this is the reason she was brought to the ER. A UA was collect by [facility] to r/o UTI but no results obtained yet. The patient was noted to come into the ER with wet shoes. Per sister, she puts patient's meds in a pill box and patient takes them on her own.  Labs in ER:  NA+ 120, K 1.4, Cl+ 62, CO2, 40, BUN 65, CR 2.80, TnI 0.06, Lactic acid 2.1, INR 8.10 UA mod, Hb, rare bacteria, TNTC sq epithelial cells and 0-5 WBC  Subjective: No complaints. Was quite weak when trying to get up with PT.     Assessment & Plan:  Severe electrolyte abnormalities, dehydration and AKI on CKD 3  Contraction alkalosis - d/c Lasix  - repeat ECHO shows normal EF now - given another 500 cc bolus of NS this AM- cont slow IVF - electrolytes(sodium, K, Mg and phos) replaced- see labs below - cont to replace daily   - renal ultrasound: small kidneys with cortical thinning  Supra therapeutic INR - total of 8 mg Vit K given- INR now 1.47- pharmacy dosing coumadin    Hyperbilirubinemia - new finding- follow  A-fib- permanent  - cont Metoprolol and Coumadin- rate controlled   Mental retardation  Moisture wounds and blisters on legs and feet - appreciate wound care eval-     Elevated troponin - mild elevation and flat trend- no further work up needed    Vulvovaginitis due to yeast - Diflucan  Gout -  allopurinol   DVT prophylaxis: Lovenox until INR is therapeutic  Code Status: Full code Family Communication: sister Disposition Plan: SNF Consultants:   none Procedures:   2 D ECHO  Upper normal LV wall thickness with LVEF 55-60%. Indeterminate   diastolic function. Moderate left atrial enlargement. Mildly   calcified mitral annulus with mild mitral regurgitation. Mild   calcified aortic annulus with mild aortic regurgitation. Mild   tricuspid regurgitation with PASP estimated 36 mmHg. Mild right   atrial enlargement. Prominent pericardial fat pad.  Antimicrobials:  Anti-infectives (From admission, onward)   Start     Dose/Rate Route Frequency Ordered Stop   09/12/17 0024  fluconazole (DIFLUCAN) tablet 150 mg     150 mg Oral Every 3 DAYS 09/12/17 0024 09/21/17 0029       Objective: Vitals:   09/14/17 2016 09/14/17 2123 09/15/17 1449 09/15/17 1521  BP:  127/63 112/80   Pulse:  61 70   Resp:  18 16   Temp:  97.6 F (36.4 C) 97.8 F (36.6 C)   TempSrc:  Axillary Axillary   SpO2: 97% 100% 96% 92%  Weight:      Height:        Intake/Output Summary (Last 24 hours) at 09/15/2017 1525 Last data filed at 09/15/2017 1233 Gross per 24 hour  Intake 844.17 ml  Output 300 ml  Net 544.17 ml   Filed Weights   09/12/17 0023 09/12/17 0400 09/14/17 0500  Weight: 83.4 kg (183 lb 13.8 oz) 83.4 kg (183 lb 13.8 oz) 86.7 kg (191 lb 2.2 oz)    Examination: General exam: Appears comfortable  HEENT: PERRLA, oral mucosa dry, no sclera icterus or thrush Respiratory system: Clear to auscultation. Respiratory effort normal. Cardiovascular system: S1 & S2 heard, RRR.  No murmurs  Gastrointestinal system: Abdomen soft, non-tender, nondistended. Normal bowel sound. No organomegaly Central nervous system: Alert. No focal neurological deficits. Extremities: No cyanosis, clubbing or edema- Skin: numerous scabs, open sores and blisters on feet and legs- dusky discoloration of  legs Psychiatry:  Mood & affect appropriate.     Data Reviewed: I have personally reviewed following labs and imaging studies  CBC: Recent Labs  Lab 09/11/17 1919 09/13/17 0442 09/14/17 0410 09/15/17 0602  WBC 9.8 6.4 7.6 7.4  NEUTROABS 8.8*  --   --   --   HGB 12.2 10.2* 10.1* 10.5*  HCT 35.5* 30.7* 30.9* 31.7*  MCV 83.9 87.0 87.8 87.6  PLT 254 181 213 188   Basic Metabolic Panel: Recent Labs  Lab 09/11/17 1919 09/11/17 2124 09/12/17 0357 09/12/17 0730 09/13/17 0442 09/14/17 0410 09/15/17 0602  NA 120* 123* 121*  --  126*  128* 128* 127*  K 1.4* 1.8* 2.3*  --  3.1*  3.3* 4.2 3.7  CL 62* 65* 72*  --  84*  85* 91* 87*  CO2 40* 41* 35*  --  34*  35* 31 31  GLUCOSE 121* 119* 100*  --  118*  118* 96 89  BUN 65* 64* 58*  --  41*  42* 29* 22*  CREATININE 2.80* 2.69* 2.24*  --  1.66*  1.60* 1.32* 1.38*  CALCIUM 9.0 9.0 8.2*  --  8.3*  8.4* 8.5* 8.9  MG 1.7  --   --  2.1 1.8 1.5* 1.7  PHOS  --   --   --   --  1.7*  --  1.5*   GFR: Estimated Creatinine Clearance: 39.8 mL/min (A) (by C-G formula based on SCr of 1.38 mg/dL (H)). Liver Function Tests: Recent Labs  Lab 09/11/17 1919 09/12/17 0357 09/13/17 0442 09/14/17 0410 09/15/17 0602  AST 41 34 36 29 30  ALT 21 16 16 17 19   ALKPHOS 175* 136* 129* 128* 158*  BILITOT 1.9* 1.4* 1.0 1.0 1.4*  PROT 7.5 5.7* 5.6* 5.6* 5.9*  ALBUMIN 3.3* 2.6* 2.6*  2.5* 2.6* 2.8*   No results for input(s): LIPASE, AMYLASE in the last 168 hours. No results for input(s): AMMONIA in the last 168 hours. Coagulation Profile: Recent Labs  Lab 09/11/17 1919 09/12/17 0357 09/13/17 0442 09/14/17 0410 09/15/17 0602  INR 8.10* 9.61* 2.43 1.33 1.47   Cardiac Enzymes: Recent Labs  Lab 09/11/17 1919 09/11/17 2124 09/13/17 0442  CKTOTAL  --  259* 90  TROPONINI 0.06* 0.06*  --    BNP (last 3 results) No results for input(s): PROBNP in the last 8760 hours. HbA1C: No results for input(s): HGBA1C in the last 72  hours. CBG: No results for input(s): GLUCAP in the last 168 hours. Lipid Profile: No results for input(s): CHOL, HDL, LDLCALC, TRIG, CHOLHDL, LDLDIRECT in the last 72 hours. Thyroid Function Tests: No results for input(s): TSH, T4TOTAL, FREET4, T3FREE, THYROIDAB in the last 72 hours. Anemia Panel: No results for input(s): VITAMINB12, FOLATE, FERRITIN, TIBC, IRON, RETICCTPCT in the last 72 hours. Urine analysis:    Component Value Date/Time   COLORURINE YELLOW 09/11/2017 1930   APPEARANCEUR CLOUDY (A) 09/11/2017 1930  LABSPEC 1.011 09/11/2017 1930   PHURINE 5.0 09/11/2017 1930   GLUCOSEU NEGATIVE 09/11/2017 1930   HGBUR MODERATE (A) 09/11/2017 1930   BILIRUBINUR NEGATIVE 09/11/2017 1930   KETONESUR NEGATIVE 09/11/2017 1930   PROTEINUR NEGATIVE 09/11/2017 1930   NITRITE NEGATIVE 09/11/2017 1930   LEUKOCYTESUR NEGATIVE 09/11/2017 1930   Sepsis Labs: @LABRCNTIP (procalcitonin:4,lacticidven:4) ) Recent Results (from the past 240 hour(s))  Urine culture     Status: None   Collection Time: 09/11/17  7:30 PM  Result Value Ref Range Status   Specimen Description URINE, CATHETERIZED  Final   Special Requests NONE  Final   Culture   Final    NO GROWTH Performed at [facility], [place] [address], [place], [place] [address]    Report Status 09/13/2017 FINAL  Final  MRSA PCR Screening     Status: None   Collection Time: 09/12/17  1:30 AM  Result Value Ref Range Status   MRSA by PCR NEGATIVE NEGATIVE Final    Comment:        The GeneXpert MRSA Assay (FDA approved for NASAL specimens only), is one component of a comprehensive MRSA colonization surveillance program. It is not intended to diagnose MRSA infection nor to guide or monitor treatment for MRSA infections.          Radiology Studies: No results found.    Scheduled Meds: . allopurinol  100 mg Oral QPM  . Chlorhexidine Gluconate Cloth  6 each Topical Daily  . diphenhydrAMINE  25 mg Oral Once  .  fluconazole  150 mg Oral Q3 days  . gabapentin  300 mg Oral QHS  . latanoprost  1 drop Both Eyes QHS  . mouth rinse  15 mL Mouth Rinse BID  . metoprolol  150 mg Oral Daily  . pantoprazole  80 mg Oral Q1200  . phosphorus  500 mg Oral TID WC & HS  . sodium chloride flush  10-40 mL Intracatheter Q12H  . sodium chloride flush  3 mL Intravenous Q12H  . warfarin  2.5 mg Oral Once  . Warfarin - Pharmacist Dosing Inpatient   Does not apply Q24H   Continuous Infusions:   LOS: 4 days    Time spent in minutes: 35    Debbe Odea, MD [facility] Pager: [email or web address] Password [email or web address] 09/15/2017, 3:25 PM

## 2017-09-15 NOTE — Clinical Social Work Placement (Signed)
   CLINICAL SOCIAL WORK PLACEMENT  NOTE  Date:  09/15/2017  Patient Details  Name: [name] MRN: [id number] Date of Birth: 1941/10/04  Clinical Social Work is seeking post-discharge placement for this patient at the [place] level of care (*CSW will initial, date and re-position this form in  chart as items are completed):  Yes   Patient/family provided with [facility] Work Department's list of facilities offering this level of care within the geographic area requested by the patient (or if unable, by the patient's family).  Yes   Patient/family informed of their freedom to choose among providers that offer the needed level of care, that participate in Medicare, Medicaid or managed care program needed by the patient, have an available bed and are willing to accept the patient.  Yes   Patient/family informed of [facility]'s ownership interest in [facility] and [facility], as well as of the fact that they are under no obligation to receive care at these facilities.  PASRR submitted to EDS on 09/14/17     PASRR number received on       Existing PASRR number confirmed on       FL2 transmitted to all facilities in geographic area requested by pt/family on       FL2 transmitted to all facilities within larger geographic area on       Patient informed that his/her managed care company has contracts with or will negotiate with certain facilities, including the following:        Yes   Patient/family informed of bed offers received.  Patient chooses bed at [facility]     Physician recommends and patient chooses bed at      Patient to be transferred to [facility] on  .  Patient to be transferred to facility by non emergency ambulance     Patient family notified on 09/15/17 of transfer.  Name of family member notified:  Eric Form     PHYSICIAN       Additional Comment:     _______________________________________________ Shade Flood, LCSW 09/15/2017, 11:58 AM

## 2017-09-15 NOTE — Clinical Social Work Note (Signed)
SW following. Pt accepted by both facilities they requested. Pt and family informed. They elected to accept placement at [facility] at the facility. Awaiting PASRR number still. Per MD, pt may be stable for dc today. Pt will need ambulance transfer. LCSW, Ambrose Pancoast, will follow up this afternoon to further assist with pt's discharge.

## 2017-09-15 NOTE — Consult Note (Signed)
[facility] Nurse wound consult note. Assessment completed via telehealth remote consultation with the assistance of the bedside clinical staff.  Reason for Consult: RLE wound  Wound type: weeping bullous areas on the right pretibial region and right foot.  Noted that she was in wet shoes upon arrival and that she was home with urinary incontinence.  Based on the appearance of the legs she most likely also has an issue with her venous and possibly arterial circulation.  This can be worked up as an outpatient in the wound care center of her choice. Pressure Injury POA: NA Measurement:  Right pretibial: 1cm x 1cmx 0cm; 100% blood filled bulla Right pretibial distal some serous/yellow crusted areas aprox. 0.5cm x 0.5cm x 0.1cm  Right lateral calf: small pinpoint, weeping serous filled blister Right first met. Head medial: 3.25cm x 2.0cm x 0.1cm; ruptured and draining serous filled blister Wound bed: see above Drainage (amount, consistency, odor) see above  Periwound: redness but bilateral, worse on the right. + edema bilaterally.  She is noted per the nursing FS to have palpable pedal pulses  Dressing procedure/placement/frequency: Add single layer of xeroform over the blistered areas, cover with dry dressing.   Advised family and would recommend follow up in the outpatient wound care center of her or family's choice. She will be DC to [facility] center in [place], [place]. There is a wound care facility associated with the medical facility in [place]. This would allow for monitoring of the wounds and work up of her vascular status as well.  Discussed POC with patient and bedside nurse.  Re consult if needed, will not follow at this time. Thanks  Nyala Kirchner [employer], RN,CWOCN, CNS, [place] [phone])

## 2017-09-15 NOTE — Discharge Summary (Addendum)
Physician Discharge Summary  [name] TKZ:[id number] DOB: Aug 26, 1941 DOA: 09/11/2017  PCP: Doree Albee, MD  Admit date: 09/11/2017 Discharge date: 09/18/2017  Admitted From: home Disposition:  SNF due to deconditioning   Recommendations for Outpatient Follow-up:  1. xeroform gauze to blistered areas- change QID and wrap with conform gauze- would care to follow at SNF 2. Cmet and INR in 3-4 days please 3. Daily weights- Lasix being held  Discharge Condition:  stable   CODE STATUS:  Full code   Diet recommendation:  Heart healthy Consultations:  none    Discharge Diagnoses:  Principal Problem:   Electrolyte depletion Active Problems:   Atrial fibrillation (HCC)   Acute renal injury ([place])   Dehydration   Supratherapeutic INR   Elevated troponin   Vulvovaginitis due to yeast   MENTAL RETARDATION   GERD (gastroesophageal reflux disease)   Chronic anticoagulation   Chronic kidney disease, stage 3 (HCC)   Hypokalemia   Hyponatremia   Hyperbilirubinemia   Elevated alkaline phosphatase level   Fatty liver suspected on imaging   Subjective: She had to urinate and was trying to get out of bed because she could not wait. Did not realize that she had a pure wick. No complaints otherwise.   HPI: Laura Cockerell Gammonis a 76 y.o.femalewith a history of cognitive deficits, atrial fibrillation on chronic anticoagulation, GERD, glaucoma, hypertension,neuropathy. Due to patient's intellectual disability/cognitive deficits, the history obtained by sister who takes care of the patient. Patient lives in a trailer in the sister's backyard and is fairly unsupervised. Over the past couple of weeks, patient has had severe urinary incontinence occurring every time she stood up and this is the reason she was brought to the ER. A UA was collect by [name] to r/o UTI but no results obtained yet. The patient was noted to come into the ER with wet shoes. Per sister, she puts patient's meds in a  pill box and patient takes them on her own.  Labs in ER:  NA+ 120, K 1.4, Cl+ 62, CO2, 40, BUN 65, CR 2.80, TnI 0.06, Lactic acid 2.1, INR 8.10 UA mod, Hb, rare bacteria, TNTC sq epithelial cells and 0-5 WBC    Hospital Course:   Severe electrolyte abnormalities, dehydration and AKI, contraction alkalosis - d/c Lasix (repeat ECHO shows normal EF now)- given IVF In hospital - electrolytes(sodium, K, Mg and phos) replaced- see labs below - problems with electrolyte disturbances exacerbated by diarrhea while in the hospital which has resolved - renal ultrasound: small kidneys with cortical thinning -check Bmet in 3 days at SNF  H/o Systolic CHF - ECHO in 5732 showed an EF of 45-50%- this has improved to 55-60% %- see report below - holdng Lasix  Supra therapeutic INR - total of 8 mg Vit K given- INR subsequently low and now normal  ? Black BM x 2 per patient's family 11/14 - hemoccult neg-  Hb stable  Elevated Alk phos and T bili -  abd ultrasound done and gallbladder not visualized - suspected to either be contracted or obscured by gas- fatty liver suspected  A-fib- permanent  - cont Metoprolol at 150 mg rather than 200 as rate is controlled on this dose - cont Coumadin    Essential HTN - Lisinopril dose decreased from 20 BID to 20 daily- cont Metoprolol  Urinary incontinence - possibly due to Lasix in setting of weakness at home - not very severe in the hospital- patient is able to tell when she  needs to void but if there is a delay in taking her to the bathroom, she will void on herself  Mental retardation - able to understand quite a bit (ie. Was able to tell me that she was NPO for a test on her abdomen) but has some Impulsive behaviors and is not oriented to place  Moisture wounds and blisters on legs and feet - due to feet being soaked in urine - appreciate wound care eval-xeroform gauze to blistered areas- change QID and wrap with conform gauze    Elevated  troponin - mild elevation and flat trend- no further work up needed    Vulvovaginitis due to yeast - treated with Diflucan  Gout - allopurinol     Procedures:   2 D ECHO  Upper normal LV wall thickness with LVEF 55-60%. Indeterminate diastolic function. Moderate left atrial enlargement. Mildly calcified mitral annulus with mild mitral regurgitation. Mild calcified aortic annulus with mild aortic regurgitation. Mild tricuspid regurgitation with PASP estimated 36 mmHg. Mild right atrial enlargement. Prominent pericardial fat pad.     Discharge Instructions   Allergies as of 09/18/2017   No Known Allergies     Medication List    STOP taking these medications   furosemide 40 MG tablet Commonly known as:  LASIX   meclizine 25 MG tablet Commonly known as:  ANTIVERT     TAKE these medications   allopurinol 100 MG tablet Commonly known as:  ZYLOPRIM Take 100 mg every evening by mouth.   alum & mag hydroxide-simeth 200-200-20 MG/5ML suspension Commonly known as:  MAALOX/MYLANTA Take 30 mLs every 4 (four) hours as needed by mouth for indigestion.   esomeprazole 40 MG capsule Commonly known as:  NEXIUM TAKE ONE CAPSULE BY MOUTH TWO TIMES DAILY   gabapentin 300 MG capsule Commonly known as:  NEURONTIN Take 300 mg at bedtime by mouth.   lisinopril 20 MG tablet Commonly known as:  PRINIVIL,ZESTRIL Take 1 tablet (20 mg total) daily by mouth. What changed:  when to take this   loratadine 10 MG tablet Commonly known as:  CLARITIN Take 10 mg by mouth as needed for allergies.   metoprolol succinate 50 MG 24 hr tablet Commonly known as:  TOPROL-XL Take 3 tablets (150 mg total) daily by mouth. Take with or immediately following a meal. Start taking on:  09/19/2017 What changed:    medication strength  how much to take  additional instructions   neomycin-polymyxin-hydrocortisone 3.5-10000-1 OTIC suspension Commonly known as:  CORTISPORIN Place  2 drops 4 (four) times daily as needed into both ears.   SIMBRINZA 1-0.2 % Susp Generic drug:  Brinzolamide-Brimonidine Place 1 drop into both eyes 2 (two) times daily.   traMADol 50 MG tablet Commonly known as:  ULTRAM Take 1 tablet (50 mg total) every 6 (six) hours as needed by mouth for moderate pain. For pain What changed:    when to take this  reasons to take this   traZODone 100 MG tablet Commonly known as:  DESYREL Take 1 tablet (100 mg total) at bedtime as needed by mouth for sleep.   Vitamin D3 5000 units Caps Take 1 capsule by mouth daily.   warfarin 2 MG tablet Commonly known as:  COUMADIN Take 2 mg daily by mouth.   XALATAN 0.005 % ophthalmic solution Generic drug:  latanoprost Place 1 drop into both eyes at bedtime.      Contact information for after-discharge care    [facility] .   Service:  Skilled Nursing Contact information: [address] [place] [phone]             No Known Allergies   Procedures/Studies:    US Renal  Result Date: 09/12/2017 CLINICAL DATA:  Chronic renal failure, stage III, with acute exacerbation EXAM: RENAL / URINARY TRACT ULTRASOUND COMPLETE COMPARISON:  None. FINDINGS: Right Kidney: Length: 8.4 cm. Echogenicity within normal limits. There is renal cortical thinning. No mass, perinephric fluid, or hydronephrosis visualized. No sonographically demonstrable calculus or ureterectasis. Left Kidney: Length: 8.9 cm. Echogenicity within normal limits there is renal cortical thinning. No mass, perinephric fluid, or hydronephrosis visualized. No sonographically demonstrable calculus or ureterectasis. Bladder: Appears normal for degree of bladder distention. IMPRESSION: Kidneys bilaterally are small with renal cortical thinning. These are findings that may be seen with medical renal disease. The renal echogenicity is within normal limits bilaterally. No obstructing  foci identified in either kidney. Electronically Signed   By: Lowella Grip III M.D.   On: 09/12/2017 13:55   Dg Chest Port 1 View  Result Date: 09/12/2017 CLINICAL DATA:  Central catheter placement EXAM: PORTABLE CHEST 1 VIEW COMPARISON:  September 11, 2017 FINDINGS: Peripherally inserted central catheter tip is at the cavoatrial junction. No pneumothorax. There is airspace consolidation in the left lower lobe with small left pleural effusion. There is underlying cardiomegaly with pulmonary venous hypertension. There is mild interstitial edema. No evident bone lesions. No adenopathy. IMPRESSION: Central catheter tip at cavoatrial junction.  No pneumothorax. Cardiomegaly with pulmonary venous hypertension consistent with pulmonary vascular congestion. Mild interstitial edema with small left pleural effusion. Left lower lobe airspace consolidation, felt to be most likely due to pneumonia. Followup PA and lateral chest radiographs recommended in 3-4 weeks following trial of antibiotic therapy to ensure resolution and exclude underlying malignancy. Electronically Signed   By: Lowella Grip III M.D.   On: 09/12/2017 13:57   Dg Chest Port 1 View  Result Date: 09/11/2017 CLINICAL DATA:  76 y/o  F; weakness, dry heaves, recent falls. EXAM: PORTABLE CHEST 1 VIEW COMPARISON:  12/26/2015 chest radiograph FINDINGS: Stable cardiomegaly and large hiatal hernia. Diffuse haziness of lungs, reticular opacities, and blunted costal diaphragmatic angles. No acute osseous abnormality identified. IMPRESSION: Mild pulmonary edema and small bilateral pleural effusions. Stable cardiomegaly and large hiatal hernia Electronically Signed   By: Kristine Garbe M.D.   On: 09/11/2017 19:39   US Abdomen Limited Ruq  Result Date: 09/17/2017 CLINICAL DATA:  Elevated liver enzymes EXAM: ULTRASOUND ABDOMEN LIMITED RIGHT UPPER QUADRANT COMPARISON:  None. FINDINGS: Gallbladder: Not visualized.  Question absence. Common  bile duct: Diameter: 5 mm. No intrahepatic or extrahepatic biliary duct dilatation. Liver: No focal lesion identified. The liver echogenicity increased diffusely. Portal vein is patent on color Doppler imaging with normal direction of blood flow towards the liver. IMPRESSION: 1. Gallbladder not seen. Question absence versus severely contracted gallbladder. Note that there is gas overlying the gallbladder fossa. 2. Diffuse increase in liver echotexture, a finding most likely indicative of hepatic steatosis. While no focal liver lesions are evident, it must be cautioned that the sensitivity of ultrasound for detection of focal liver lesions is diminished in this circumstance. Electronically Signed   By: Lowella Grip III M.D.   On: 09/17/2017 12:06       Discharge Exam: Vitals:   09/18/17 0645 09/18/17 1037  BP: 121/71 132/78  Pulse: 90 (!) 102  Resp: 18 20  Temp: 97.6 F (36.4  C)   SpO2: 97% 95%   Vitals:   09/17/17 2002 09/17/17 2050 09/18/17 0645 09/18/17 1037  BP:  115/85 121/71 132/78  Pulse:  99 90 (!) 102  Resp:  _0 Temp:  98.3 F (36.8 C) 97.6 F (36.4 C)   TempSrc:  Oral Oral   SpO2: 97% 100% 97% 95%  Weight:      Height:        General: Pt is alert, awake, not in acute distress Cardiovascular: RRR, S1/S2 +, no rubs, no gallops Respiratory: CTA bilaterally, no wheezing, no rhonchi Abdominal: Soft, NT, ND, bowel sounds + Extremities: no edema, no cyanosis- good pedal pulses Skin: erythema, abrasions, blisters and scab on lower legs noted Neuro: oriented to person and place- normal strength in extermities    The results of significant diagnostics from this hospitalization (including imaging, microbiology, ancillary and laboratory) are listed below for reference.     Microbiology: Recent Results (from the past 240 hour(s))  Urine culture     Status: None   Collection Time: 09/11/17  7:30 PM  Result Value Ref Range Status   Specimen Description URINE,  CATHETERIZED  Final   Special Requests NONE  Final   Culture   Final    NO GROWTH Performed at [facility], [address], [place], [place] [address]    Report Status 09/13/2017 FINAL  Final  MRSA PCR Screening     Status: None   Collection Time: 09/12/17  1:30 AM  Result Value Ref Range Status   MRSA by PCR NEGATIVE NEGATIVE Final    Comment:        The GeneXpert MRSA Assay (FDA approved for NASAL specimens only), is one component of a comprehensive MRSA colonization surveillance program. It is not intended to diagnose MRSA infection nor to guide or monitor treatment for MRSA infections.      Labs: BNP (last 3 results) No results for input(s): BNP in the last 8760 hours. Basic Metabolic Panel: Recent Labs  Lab 09/13/17 0442 09/14/17 0410 09/15/17 0602 09/15/17 1543 09/16/17 0545 09/16/17 1610 09/17/17 0450 09/18/17 0656  NA 126*  128* 128* 127* 123* 126* 128* 127*  128* 131*  K 3.1*  3.3* 4.2 3.7 3.5 2.7* 3.3* 2.8*  2.8* 4.6  CL 84*  85* 91* 87* 85* 85* 90* 89*  90* 99*  CO2 34*  35* _1 GLUCOSE 118*  118* 96 89 103* 111* 101* 100*  101* 97  BUN 41*  42* 29* 22* _2 CREATININE 1.66*  1.60* 1.32* 1.38* 1.39* 1.34* 1.31* 1.19*  1.19* 1.16*  CALCIUM 8.3*  8.4* 8.5* 8.9 8.9 8.2* 8.2* 7.9*  8.0* 8.1*  MG 1.8 1.5* 1.7  --  1.9  --  1.6* 2.1  PHOS 1.7*  --  1.5*  --  2.8  --  3.4  3.4 2.1*  2.1*   Liver Function Tests: Recent Labs  Lab 09/13/17 0442 09/14/17 0410 09/15/17 0602 09/16/17 0545 09/17/17 0450 09/18/17 0656  AST 36 _3 --   ALT _4 --   ALKPHOS 129* 128* 158* 153* 144*  --   BILITOT 1.0 1.0 1.4* 1.5* 1.1  --   PROT 5.6* 5.6* 5.9* 5.6* 5.2*  --   ALBUMIN 2.6*  2.5* 2.6* 2.8* 2.5* 2.4*  2.4* 2.4*   No results for input(s):  LIPASE, AMYLASE in the last 168 hours. No results for input(s): AMMONIA in the last 168 hours. CBC: Recent Labs  Lab 09/11/17 1919  09/13/17 0442 09/14/17 0410 09/15/17 0602 09/16/17 0545 09/17/17 0450  WBC 9.8 6.4 7.6 7.4 6.0 5.3  NEUTROABS 8.8*  --   --   --   --   --   HGB 12.2 10.2* 10.1* 10.5* 10.2* 9.2*  HCT 35.5* 30.7* 30.9* 31.7* 30.4* 27.1*  MCV 83.9 87.0 87.8 87.6 86.4 86.9  PLT 254 181 213 208 185 166   Cardiac Enzymes: Recent Labs  Lab 09/11/17 1919 09/11/17 2124 09/13/17 0442  CKTOTAL  --  259* 90  TROPONINI 0.06* 0.06*  --    BNP: Invalid input(s): POCBNP CBG: No results for input(s): GLUCAP in the last 168 hours. D-Dimer No results for input(s): DDIMER in the last 72 hours. Hgb A1c No results for input(s): HGBA1C in the last 72 hours. Lipid Profile No results for input(s): CHOL, HDL, LDLCALC, TRIG, CHOLHDL, LDLDIRECT in the last 72 hours. Thyroid function studies No results for input(s): TSH, T4TOTAL, T3FREE, THYROIDAB in the last 72 hours.  Invalid input(s): FREET3 Anemia work up No results for input(s): VITAMINB12, FOLATE, FERRITIN, TIBC, IRON, RETICCTPCT in the last 72 hours. Urinalysis    Component Value Date/Time   COLORURINE YELLOW 09/11/2017 1930   APPEARANCEUR CLOUDY (A) 09/11/2017 1930   LABSPEC 1.011 09/11/2017 1930   PHURINE 5.0 09/11/2017 1930   GLUCOSEU NEGATIVE 09/11/2017 1930   HGBUR MODERATE (A) 09/11/2017 1930   BILIRUBINUR NEGATIVE 09/11/2017 1930   KETONESUR NEGATIVE 09/11/2017 1930   PROTEINUR NEGATIVE 09/11/2017 1930   NITRITE NEGATIVE 09/11/2017 1930   LEUKOCYTESUR NEGATIVE 09/11/2017 1930   Sepsis Labs Invalid input(s): PROCALCITONIN,  WBC,  LACTICIDVEN Microbiology Recent Results (from the past 240 hour(s))  Urine culture     Status: None   Collection Time: 09/11/17  7:30 PM  Result Value Ref Range Status   Specimen Description URINE, CATHETERIZED  Final   Special Requests NONE  Final   Culture   Final    NO GROWTH Performed at [facility], [place] [address] [place], [place] [address]    Report Status 09/13/2017 FINAL  Final  MRSA PCR  Screening     Status: None   Collection Time: 09/12/17  1:30 AM  Result Value Ref Range Status   MRSA by PCR NEGATIVE NEGATIVE Final    Comment:        The GeneXpert MRSA Assay (FDA approved for NASAL specimens only), is one component of a comprehensive MRSA colonization surveillance program. It is not intended to diagnose MRSA infection nor to guide or monitor treatment for MRSA infections.      Time coordinating discharge: Over 30 minutes  SIGNED:   Debbe Odea, MD  [facility] 09/18/2017, 12:38 PM Pager   If 7PM-7AM, please contact night-coverage [email or web address] Password [email or web address]

## 2017-09-15 NOTE — Progress Notes (Signed)
Occupational Therapy Treatment Patient Details Name: [name] MRN: [id number] DOB: 27-Nov-1940 Today's Date: 09/15/2017    History of present illness [name] is a 76 y.o. female with a history of cognitive deficits, atrial fibrillation on chronic anticoagulation, GERD, glaucoma, hypertension, neuropathy.  Due to patient's mental retardation/cognitive deficits, the history obtained by sister who takes care of the patient.  Patient lives in a trailer in the sister's backyard and is fairly unsupervised.  Over the past couple of weeks, patient has had severe urinary incontinence occurring every time she stood up.  A nurse from Dr. Lanice Shirts office came and picked up the urine on Thursday to rule out UTI.  Today, the patient asked to be taken to the doctor's office and was brought here via EMS for evaluation.  No palliating or provoking factors to the patient's urinary incontinence.  Currently, the patient was in wet shoes for the majority of the day.   OT comments  Pt received semi-reclined, family present, asking to get up. Pt requiring verbal cuing for follow through with mobility tasks and transfers. Set-up for feeding, max assist for LB dressing. Pt with moderate fatigue during task, after stand-pivot transfer to chair pt fatigued and unable to complete grooming tasks. Pt left with tray set-up for breakfast. Discharge to SNF remains appropriate.   Follow Up Recommendations  SNF    Equipment Recommendations  None recommended by OT       Precautions / Restrictions Precautions Precautions: Fall Precaution Comments: Due to recent decline in mobility.        Mobility Bed Mobility Overal bed mobility: Needs Assistance Bed Mobility: Supine to Sit     Supine to sit: Min guard;HOB elevated        Transfers Overall transfer level: Needs assistance Equipment used: Rolling walker (2 wheeled) Transfers: Sit to/from [facility] Sit to Stand: Mod assist Stand pivot  transfers: Mod assist                ADL either performed or assessed with clinical judgement   ADL Overall ADL's : Needs assistance/impaired Eating/Feeding: Set up;Sitting                   Lower Body Dressing: Maximal assistance;Sitting/lateral leans                                 Cognition Arousal/Alertness: Awake/alert Behavior During Therapy: Agitated;Anxious Overall Cognitive Status: History of cognitive impairments - at baseline                                                     Pertinent Vitals/ Pain       Pain Assessment: No/denies pain         Frequency  Min 2X/week        Progress Toward Goals  OT Goals(current goals can now be found in the care plan section)  Progress towards OT goals: Progressing toward goals  Acute Rehab OT Goals Patient Stated Goal: To go home OT Goal Formulation: With patient/family Time For Goal Achievement: 09/27/17 Potential to Achieve Goals: Good ADL Goals Pt Will Perform Grooming: with supervision;sitting Pt Will Perform Upper Body Bathing: with supervision;sitting Pt Will Perform Upper Body Dressing: with min assist;sitting Pt Will Transfer to Toilet:  with supervision;stand pivot transfer;bedside commode  Plan Discharge plan remains appropriate          End of Session Equipment Utilized During Treatment: Gait belt;Rolling walker  OT Visit Diagnosis: Muscle weakness (generalized) (M62.81)   Activity Tolerance Patient limited by fatigue   Patient Left in chair;with call bell/phone within reach;with family/visitor present   Nurse Communication Other (comment)(PureWick removal)        Time: 0340-3524 OT Time Calculation (min): 33 min  Charges: OT General Charges $OT Visit: 1 Visit OT Treatments $Self Care/Home Management : 23-37 mins    Guadelupe Sabin, OTR/L  [phone] 09/15/2017, 8:30 AM

## 2017-09-15 NOTE — Progress Notes (Signed)
[name]  MRN: [id number]  DOB/AGE: 1940-11-04 76 y.o.  Primary Care Physician:Gosrani, Doristine Johns, MD  Admit date: 09/11/2017  Chief Complaint:  Chief Complaint  Patient presents with  . Urinary Incontinence    S-Pt presented on  09/11/2017 with  Chief Complaint  Patient presents with  . Urinary Incontinence  .    Pt offers no new concerns.Pt continues to ask " when can I go home?"   Meds . allopurinol  100 mg Oral QPM  . Chlorhexidine Gluconate Cloth  6 each Topical Daily  . diphenhydrAMINE  25 mg Oral Once  . fluconazole  150 mg Oral Q3 days  . furosemide  20 mg Oral Daily  . gabapentin  300 mg Oral QHS  . latanoprost  1 drop Both Eyes QHS  . mouth rinse  15 mL Mouth Rinse BID  . metoprolol  150 mg Oral Daily  . pantoprazole  80 mg Oral Q1200  . phosphorus  500 mg Oral TID WC & HS  . sodium chloride flush  10-40 mL Intracatheter Q12H  . sodium chloride flush  3 mL Intravenous Q12H  . Warfarin - Pharmacist Dosing Inpatient   Does not apply Q24H      Physical Exam: Vital signs in last 24 hours: Temp:  [97.6 F (36.4 C)-97.8 F (36.6 C)] 97.6 F (36.4 C) (11/13 2123) Pulse Rate:  [61-85] 61 (11/13 2123) Resp:  [16-18] 18 (11/13 2123) BP: (121-127)/(63-78) 127/63 (11/13 2123) SpO2:  [95 %-100 %] 100 % (11/13 2123) Weight change:  Last BM Date: 09/13/17  Intake/Output from previous day: 11/13 0701 - 11/14 0700 In: 480 [P.O.:480] Out: 300 [Urine:300] No intake/output data recorded.   Physical Exam: General- pt is awake,alert, follows commands Resp- No acute REsp distress, CTA B/L NO Rhonchi CVS- S1S2 regular ij rate and rhythm GIT- BS+, soft, NT, ND EXT- NO LE Edema, NO Cyanosis   Lab Results: CBC Recent Labs    09/14/17 0410 09/15/17 0602  WBC 7.6 7.4  HGB 10.1* 10.5*  HCT 30.9* 31.7*  PLT 213 208    BMET Recent Labs    09/14/17 0410 09/15/17 0602  NA 128* 127*  K 4.2 3.7  CL 91* 87*  CO2 31 31  GLUCOSE 96 89  BUN 29* 22*   CREATININE 1.32* 1.38*  CALCIUM 8.5* 8.9   Trend Creat 2018  2.8=>2.2=>1.6=> 1.32 2017  1.2--1.3 2013   1.8--2.1 2011    1.4 2010    1.3   MICRO Recent Results (from the past 240 hour(s))  Urine culture     Status: None   Collection Time: 09/11/17  7:30 PM  Result Value Ref Range Status   Specimen Description URINE, CATHETERIZED  Final   Special Requests NONE  Final   Culture   Final    NO GROWTH Performed at [facility], [place] [address] [place], [place] [address]    Report Status 09/13/2017 FINAL  Final  MRSA PCR Screening     Status: None   Collection Time: 09/12/17  1:30 AM  Result Value Ref Range Status   MRSA by PCR NEGATIVE NEGATIVE Final    Comment:        The GeneXpert MRSA Assay (FDA approved for NASAL specimens only), is one component of a comprehensive MRSA colonization surveillance program. It is not intended to diagnose MRSA infection nor to guide or monitor treatment for MRSA infections.       Lab Results  Component Value Date  CALCIUM 8.9 09/15/2017   PHOS 1.5 (L) 09/15/2017               Impression: 1)Renal  AKI secondary to Prerenal/ATN                AKI sec to Hypovolemia/ACE on board                AKI on CKD               CKD stage 3.               CKD since 2010               CKD secondary to HTN/Multiple AKI                Progression of CKD now marked with AKI                Proteinura Absent .                Hematuria none.                Nephrolithiasis Hx Absent                  AKi now better                 Creat back to baseline   2)HTN  BP on lower side  Medication-  On Beta blockers    3)Anemia HGb trending down Hx of Iron defcicny anemia Trending low sec to IVF   4)Hyponatremia    Hypovolemia hyponatremia and Hypokalemia    120=> 121=>128  5)Hypokalemia Sec to hypovolemia from diuretics On kcl 2.3=>3.3=> 4.2=>3.7  6)CVs-hx of Afib  7)Acid base Co2 highMetabolic  aklalosis Contraction alkalosis Sec to diuretics 41=> 35=> 31 Now better   Plan:  Will continue current care      [place] S 09/15/2017, 9:20 AM

## 2017-09-16 ENCOUNTER — Inpatient Hospital Stay (HOSPITAL_COMMUNITY): Payer: Medicare Other

## 2017-09-16 DIAGNOSIS — Z452 Encounter for adjustment and management of vascular access device: Secondary | ICD-10-CM

## 2017-09-16 DIAGNOSIS — R945 Abnormal results of liver function studies: Secondary | ICD-10-CM

## 2017-09-16 LAB — COMPREHENSIVE METABOLIC PANEL
ALK PHOS: 153 U/L — AB (ref 38–126)
ALT: 17 U/L (ref 14–54)
ANION GAP: 10 (ref 5–15)
AST: 29 U/L (ref 15–41)
Albumin: 2.5 g/dL — ABNORMAL LOW (ref 3.5–5.0)
BILIRUBIN TOTAL: 1.5 mg/dL — AB (ref 0.3–1.2)
BUN: 16 mg/dL (ref 6–20)
CALCIUM: 8.2 mg/dL — AB (ref 8.9–10.3)
CO2: 31 mmol/L (ref 22–32)
CREATININE: 1.34 mg/dL — AB (ref 0.44–1.00)
Chloride: 85 mmol/L — ABNORMAL LOW (ref 101–111)
GFR calc non Af Amer: 38 mL/min — ABNORMAL LOW (ref >60)
GFR, EST AFRICAN AMERICAN: 44 mL/min — AB (ref >60)
Glucose, Bld: 111 mg/dL — ABNORMAL HIGH (ref 65–99)
Potassium: 2.7 mmol/L — CL (ref 3.5–5.1)
SODIUM: 126 mmol/L — AB (ref 135–145)
TOTAL PROTEIN: 5.6 g/dL — AB (ref 6.5–8.1)

## 2017-09-16 LAB — CBC
HCT: 30.4 % — ABNORMAL LOW (ref 36.0–46.0)
HEMOGLOBIN: 10.2 g/dL — AB (ref 12.0–15.0)
MCH: 29 pg (ref 26.0–34.0)
MCHC: 33.6 g/dL (ref 30.0–36.0)
MCV: 86.4 fL (ref 78.0–100.0)
PLATELETS: 185 10*3/uL (ref 150–400)
RBC: 3.52 MIL/uL — AB (ref 3.87–5.11)
RDW: 18.4 % — ABNORMAL HIGH (ref 11.5–15.5)
WBC: 6 10*3/uL (ref 4.0–10.5)

## 2017-09-16 LAB — BASIC METABOLIC PANEL
ANION GAP: 11 (ref 5–15)
BUN: 14 mg/dL (ref 6–20)
CO2: 27 mmol/L (ref 22–32)
Calcium: 8.2 mg/dL — ABNORMAL LOW (ref 8.9–10.3)
Chloride: 90 mmol/L — ABNORMAL LOW (ref 101–111)
Creatinine, Ser: 1.31 mg/dL — ABNORMAL HIGH (ref 0.44–1.00)
GFR calc Af Amer: 45 mL/min — ABNORMAL LOW (ref >60)
GFR, EST NON AFRICAN AMERICAN: 39 mL/min — AB (ref >60)
GLUCOSE: 101 mg/dL — AB (ref 65–99)
POTASSIUM: 3.3 mmol/L — AB (ref 3.5–5.1)
Sodium: 128 mmol/L — ABNORMAL LOW (ref 135–145)

## 2017-09-16 LAB — PROTIME-INR
INR: 1.75
PROTHROMBIN TIME: 20.3 s — AB (ref 11.4–15.2)

## 2017-09-16 LAB — PHOSPHORUS: PHOSPHORUS: 2.8 mg/dL (ref 2.5–4.6)

## 2017-09-16 LAB — OCCULT BLOOD X 1 CARD TO LAB, STOOL: FECAL OCCULT BLD: NEGATIVE

## 2017-09-16 LAB — MAGNESIUM: MAGNESIUM: 1.9 mg/dL (ref 1.7–2.4)

## 2017-09-16 MED ORDER — SODIUM CHLORIDE 1 G PO TABS
1.0000 g | ORAL_TABLET | Freq: Three times a day (TID) | ORAL | Status: DC
  Administered 2017-09-16 – 2017-09-20 (×13): 1 g via ORAL
  Filled 2017-09-16 (×19): qty 1

## 2017-09-16 MED ORDER — WARFARIN SODIUM 2 MG PO TABS
2.0000 mg | ORAL_TABLET | Freq: Once | ORAL | Status: AC
  Administered 2017-09-16: 2 mg via ORAL
  Filled 2017-09-16: qty 1

## 2017-09-16 MED ORDER — SODIUM CHLORIDE 0.9 % IV BOLUS (SEPSIS)
1000.0000 mL | Freq: Once | INTRAVENOUS | Status: AC
  Administered 2017-09-16: 1000 mL via INTRAVENOUS

## 2017-09-16 MED ORDER — TRAZODONE HCL 50 MG PO TABS
100.0000 mg | ORAL_TABLET | Freq: Every day | ORAL | Status: DC
  Administered 2017-09-16 – 2017-09-19 (×4): 100 mg via ORAL
  Filled 2017-09-16 (×4): qty 2

## 2017-09-16 MED ORDER — POTASSIUM CHLORIDE CRYS ER 20 MEQ PO TBCR
40.0000 meq | EXTENDED_RELEASE_TABLET | ORAL | Status: AC
  Administered 2017-09-16 (×2): 40 meq via ORAL
  Filled 2017-09-16 (×2): qty 2

## 2017-09-16 MED ORDER — POTASSIUM CHLORIDE 10 MEQ/100ML IV SOLN
10.0000 meq | INTRAVENOUS | Status: AC
  Administered 2017-09-16 (×3): 10 meq via INTRAVENOUS
  Filled 2017-09-16 (×3): qty 100

## 2017-09-16 NOTE — Progress Notes (Signed)
CRITICAL VALUE ALERT  Critical Value:  Potassium 2.7  Date & Time Notied: 09/16/2017, 1007  Provider Notified: Georgiann Mohs  Orders Received/Actions taken: No new orders at this time

## 2017-09-16 NOTE — Progress Notes (Signed)
ANTICOAGULATION CONSULT NOTE - follow up  Pharmacy Consult for  COUMADIN (home med) Indication: atrial fibrillation  No Known Allergies  Patient Measurements: Height: 5\' 7"  (170.2 cm) Weight: 191 lb 2.2 oz (86.7 kg) IBW/kg (Calculated) : 61.6  Vital Signs: Temp: 98.6 F (37 C) (11/15 0508) Temp Source: Axillary (11/15 0508) BP: 92/42 (11/15 0508) Pulse Rate: 93 (11/15 0508)  Labs: Recent Labs    09/14/17 0410 09/15/17 0602 09/15/17 1543 09/16/17 0545  HGB 10.1* 10.5*  --  10.2*  HCT 30.9* 31.7*  --  30.4*  PLT 213 208  --  185  LABPROT 16.4* 17.7*  --  20.3*  INR 1.33 1.47  --  1.75  CREATININE 1.32* 1.38* 1.39* 1.34*   Estimated Creatinine Clearance: 41 mL/min (A) (by C-G formula based on SCr of 1.34 mg/dL (H)).  Assessment: INR > 9 on admission.  MD ordered Vitamin K on admission.  INR has responded nicely.  INR now below goal  No bleeding noted.  INR response now will likely be sluggish due to Vitamin K effect.    Goal of Therapy:  INR 2-3 Monitor platelets by anticoagulation protocol: Yes   Plan:  Coumadin 2 mg today x 1 Monitor serial labs, INR, CBC Monitor for s/sx bleeding  Pricilla Larsson 09/16/2017,9:12 AM

## 2017-09-16 NOTE — Care Management Note (Signed)
Case Management Note  Patient Details  Name: [name] MRN: [id number] Date of Birth: 1940-12-05  If discussed at [place] Length of Stay Meetings, dates discussed:  09/16/2017  Additional Comments:  Sherald Barge, RN 09/16/2017, 12:02 PM

## 2017-09-16 NOTE — Progress Notes (Addendum)
PROGRESS NOTE    [name]   XNA:[id number]  DOB: 1941/05/19  DOA: 09/11/2017 PCP: Doree Albee, MD   Brief Narrative:  [name] a 76 y.o.femalewith a history of cognitive deficits, atrial fibrillation on chronic anticoagulation, GERD, glaucoma, hypertension,neuropathy. Due to patient'sintellectual disability/cognitive deficits, the history obtained by sister who takes care of the patient. Patient lives in a trailer in the sister's backyard and is fairly unsupervised. Over the past couple of weeks, patient has had severe urinary incontinence occurring every time she stood up and this is the reason she was brought to the ER. A UA was collect by [facility] to r/o UTI but no results obtained yet. The patient was noted to come into the ER with wet shoes. Per sister, she puts patient's meds in a pill box and patient takes them on her own.  Labs in ER:  NA+ 120, K 1.4, Cl+ 62, CO2, 40, BUN 65, CR 2.80, TnI 0.06, Lactic acid 2.1, INR 8.10 UA mod, Hb, rare bacteria, TNTC sq epithelial cells and 0-5 WBC  Subjective: No complaints. Per niece, she had 2 large BMs yesterday which were "sticky" and dark brown/ black. PICC line does not look normal.  She was able to walk to the door and ate fairly well. She rested better last night but not as well as she would have liked.      Assessment & Plan:  Severe electrolyte abnormalities, dehydration and AKI on CKD 3  Contraction alkalosis - d/c Lasix  - repeat ECHO shows normal EF now - given another 500 cc bolus of NS this AM- cont slow IVF - electrolytes(sodium, K, Mg and phos) replaced- see labs below - cont to replace daily  - K is 2.7 today likely because of large BMs yesterday - renal ultrasound: small kidneys with cortical thinning  Supra therapeutic INR - total of 8 mg Vit K given- INR now < 2 - pharmacy dosing coumadin  ? Black BM x 2 yesterday - hemoccult neg-  Hb stable  Bruising around PICC - we do have one IV line  however, as we need good IV access, I prefer not like to remove the PICC- I have asked RN to ask the PICC team to make sure it is working properly    Hyperbilirubinemia - with mildly elevated Alk phos- new finding- check liver/ gallbladder ultrasound today  Urinary incontinence - not very severe- patient is able to tell when she needs to void   A-fib- permanent  - cont Metoprolol and Coumadin- rate controlled   Mental retardation  Moisture wounds and blisters on legs and feet - appreciate wound care eval-     Elevated troponin - mild elevation and flat trend- no further work up needed    Vulvovaginitis due to yeast - Diflucan  Gout - allopurinol   DVT prophylaxis: Lovenox until INR is therapeutic  Code Status: Full code Family Communication: sister Disposition Plan: SNF Consultants:   none Procedures:   2 D ECHO  Upper normal LV wall thickness with LVEF 55-60%. Indeterminate   diastolic function. Moderate left atrial enlargement. Mildly   calcified mitral annulus with mild mitral regurgitation. Mild   calcified aortic annulus with mild aortic regurgitation. Mild   tricuspid regurgitation with PASP estimated 36 mmHg. Mild right   atrial enlargement. Prominent pericardial fat pad.  Antimicrobials:  Anti-infectives (From admission, onward)   Start     Dose/Rate Route Frequency Ordered Stop   09/12/17 0024  fluconazole (DIFLUCAN) tablet  150 mg     150 mg Oral Every 3 DAYS 09/12/17 0024 09/21/17 0029       Objective: Vitals:   09/15/17 2052 09/16/17 0508 09/16/17 0821 09/16/17 1246  BP: 117/70 (!) 92/42  106/74  Pulse: 81 93  77  Resp: 17 18  16   Temp: 97.6 F (36.4 C) 98.6 F (37 C)  98.2 F (36.8 C)  TempSrc: Axillary Axillary  Axillary  SpO2: 100% 94% 92% 100%  Weight:      Height:        Intake/Output Summary (Last 24 hours) at 09/16/2017 1335 Last data filed at 09/16/2017 0700 Gross per 24 hour  Intake 720 ml  Output 400 ml  Net 320 ml    Filed Weights   09/12/17 0023 09/12/17 0400 09/14/17 0500  Weight: 83.4 kg (183 lb 13.8 oz) 83.4 kg (183 lb 13.8 oz) 86.7 kg (191 lb 2.2 oz)    Examination: General exam: Appears comfortable  HEENT: PERRLA, oral mucosa dry, no sclera icterus or thrush Respiratory system: Clear to auscultation. Respiratory effort normal. Cardiovascular system: S1 & S2 heard, RRR.  No murmurs  Gastrointestinal system: Abdomen soft, non-tender, nondistended. Normal bowel sound. No organomegaly Central nervous system: Alert. No focal neurological deficits. Extremities: No cyanosis, clubbing or edema- bruising around PICC line with moderate edema noted Skin: numerous scabs, open sores and blisters on feet and legs- dusky discoloration of legs Psychiatry:  Mood & affect appropriate.     Data Reviewed: I have personally reviewed following labs and imaging studies  CBC: Recent Labs  Lab 09/11/17 1919 09/13/17 0442 09/14/17 0410 09/15/17 0602 09/16/17 0545  WBC 9.8 6.4 7.6 7.4 6.0  NEUTROABS 8.8*  --   --   --   --   HGB 12.2 10.2* 10.1* 10.5* 10.2*  HCT 35.5* 30.7* 30.9* 31.7* 30.4*  MCV 83.9 87.0 87.8 87.6 86.4  PLT 254 181 213 208 837   Basic Metabolic Panel: Recent Labs  Lab 09/12/17 0730 09/13/17 0442 09/14/17 0410 09/15/17 0602 09/15/17 1543 09/16/17 0545  NA  --  126*  128* 128* 127* 123* 126*  K  --  3.1*  3.3* 4.2 3.7 3.5 2.7*  CL  --  84*  85* 91* 87* 85* 85*  CO2  --  34*  35* 31 31 26 31   GLUCOSE  --  118*  118* 96 89 103* 111*  BUN  --  41*  42* 29* 22* 19 16  CREATININE  --  1.66*  1.60* 1.32* 1.38* 1.39* 1.34*  CALCIUM  --  8.3*  8.4* 8.5* 8.9 8.9 8.2*  MG 2.1 1.8 1.5* 1.7  --  1.9  PHOS  --  1.7*  --  1.5*  --  2.8   GFR: Estimated Creatinine Clearance: 41 mL/min (A) (by C-G formula based on SCr of 1.34 mg/dL (H)). Liver Function Tests: Recent Labs  Lab 09/12/17 0357 09/13/17 0442 09/14/17 0410 09/15/17 0602 09/16/17 0545  AST 34 36 29 30 29   ALT 16  16 17 19 17   ALKPHOS 136* 129* 128* 158* 153*  BILITOT 1.4* 1.0 1.0 1.4* 1.5*  PROT 5.7* 5.6* 5.6* 5.9* 5.6*  ALBUMIN 2.6* 2.6*  2.5* 2.6* 2.8* 2.5*   No results for input(s): LIPASE, AMYLASE in the last 168 hours. No results for input(s): AMMONIA in the last 168 hours. Coagulation Profile: Recent Labs  Lab 09/12/17 0357 09/13/17 0442 09/14/17 0410 09/15/17 0602 09/16/17 0545  INR 9.61* 2.43 1.33 1.47 1.75  Cardiac Enzymes: Recent Labs  Lab 09/11/17 1919 09/11/17 2124 09/13/17 0442  CKTOTAL  --  259* 90  TROPONINI 0.06* 0.06*  --    BNP (last 3 results) No results for input(s): PROBNP in the last 8760 hours. HbA1C: No results for input(s): HGBA1C in the last 72 hours. CBG: No results for input(s): GLUCAP in the last 168 hours. Lipid Profile: No results for input(s): CHOL, HDL, LDLCALC, TRIG, CHOLHDL, LDLDIRECT in the last 72 hours. Thyroid Function Tests: No results for input(s): TSH, T4TOTAL, FREET4, T3FREE, THYROIDAB in the last 72 hours. Anemia Panel: No results for input(s): VITAMINB12, FOLATE, FERRITIN, TIBC, IRON, RETICCTPCT in the last 72 hours. Urine analysis:    Component Value Date/Time   COLORURINE YELLOW 09/11/2017 1930   APPEARANCEUR CLOUDY (A) 09/11/2017 1930   LABSPEC 1.011 09/11/2017 1930   PHURINE 5.0 09/11/2017 1930   GLUCOSEU NEGATIVE 09/11/2017 1930   HGBUR MODERATE (A) 09/11/2017 1930   BILIRUBINUR NEGATIVE 09/11/2017 1930   KETONESUR NEGATIVE 09/11/2017 1930   PROTEINUR NEGATIVE 09/11/2017 1930   NITRITE NEGATIVE 09/11/2017 1930   LEUKOCYTESUR NEGATIVE 09/11/2017 1930   Sepsis Labs: @LABRCNTIP (procalcitonin:4,lacticidven:4) ) Recent Results (from the past 240 hour(s))  Urine culture     Status: None   Collection Time: 09/11/17  7:30 PM  Result Value Ref Range Status   Specimen Description URINE, CATHETERIZED  Final   Special Requests NONE  Final   Culture   Final    NO GROWTH Performed at [facility], [place] [address] [place],  [address]    Report Status 09/13/2017 FINAL  Final  MRSA PCR Screening     Status: None   Collection Time: 09/12/17  1:30 AM  Result Value Ref Range Status   MRSA by PCR NEGATIVE NEGATIVE Final    Comment:        The GeneXpert MRSA Assay (FDA approved for NASAL specimens only), is one component of a comprehensive MRSA colonization surveillance program. It is not intended to diagnose MRSA infection nor to guide or monitor treatment for MRSA infections.          Radiology Studies: No results found.    Scheduled Meds: . allopurinol  100 mg Oral QPM  . Chlorhexidine Gluconate Cloth  6 each Topical Daily  . diphenhydrAMINE  25 mg Oral Once  . enoxaparin (LOVENOX) injection  40 mg Subcutaneous Q24H  . fluconazole  150 mg Oral Q3 days  . gabapentin  300 mg Oral QHS  . latanoprost  1 drop Both Eyes QHS  . mouth rinse  15 mL Mouth Rinse BID  . metoprolol  150 mg Oral Daily  . pantoprazole  40 mg Oral Q1200  . phosphorus  500 mg Oral TID WC & HS  . potassium chloride  40 mEq Oral Q4H  . sodium chloride flush  10-40 mL Intracatheter Q12H  . sodium chloride flush  3 mL Intravenous Q12H  . sodium chloride  1 g Oral TID WC  . traZODone  100 mg Oral QHS  . warfarin  2 mg Oral Once  . Warfarin - Pharmacist Dosing Inpatient   Does not apply Q24H   Continuous Infusions:   LOS: 5 days    Time spent in minutes: 35    Debbe Odea, MD [facility] Pager: [email or web address] Password [email or web address] 09/16/2017, 1:35 PM

## 2017-09-16 NOTE — Progress Notes (Signed)
Physical Therapy Treatment Patient Details Name: [name] MRN: [id number] DOB: 10/16/1941 Today's Date: 09/16/2017    History of Present Illness [name] is a 76 y.o. female with a history of cognitive deficits, atrial fibrillation on chronic anticoagulation, GERD, glaucoma, hypertension, neuropathy.  Due to patient's mental retardation/cognitive deficits, the history obtained by sister who takes care of the patient.  Patient lives in a trailer in the sister's backyard and is fairly unsupervised.  Over the past couple of weeks, patient has had severe urinary incontinence occurring every time she stood up.  A nurse from Dr. Lanice Shirts office came and picked up the urine on Thursday to rule out UTI.  Today, the patient asked to be taken to the doctor's office and was brought here via EMS for evaluation.  No palliating or provoking factors to the patient's urinary incontinence.  Currently, the patient was in wet shoes for the majority of the day.    PT Comments    Patient limited to a few sit to stands from chair secondary to c/o fatigue and unable to attempt gait training.  Patient will benefit from continued physical therapy in hospital and recommended venue below to increase strength, balance, endurance for safe ADLs and gait.    Follow Up Recommendations  SNF;Supervision/Assistance - 24 hour     Equipment Recommendations  None recommended by PT    Recommendations for Other Services       Precautions / Restrictions Precautions Precautions: Fall Restrictions Weight Bearing Restrictions: No    Mobility  Bed Mobility               General bed mobility comments: Patient presents up in chair (assisted by nursing staff)  Transfers Overall transfer level: Needs assistance Equipment used: Standard walker Transfers: Sit to/from Stand Sit to Stand: Min assist;Mod assist         General transfer comment: verbal cues for proper hand placement during sit to  stands  Ambulation/Gait                 Stairs            Wheelchair Mobility    Modified Rankin (Stroke Patients Only)       Balance Overall balance assessment: Needs assistance Sitting-balance support: Feet supported;Bilateral upper extremity supported Sitting balance-Leahy Scale: Fair     Standing balance support: Bilateral upper extremity supported;During functional activity Standing balance-Leahy Scale: Poor                              Cognition Arousal/Alertness: Awake/alert Behavior During Therapy: Anxious Overall Cognitive Status: History of cognitive impairments - at baseline                                        Exercises General Exercises - Lower Extremity Ankle Circles/Pumps: Seated;AROM;Strengthening;Both;5 reps Long Arc Quad: Seated;AROM;Strengthening;Both;10 reps Hip Flexion/Marching: Seated;AROM;Strengthening;Both;10 reps    General Comments        Pertinent Vitals/Pain Pain Assessment: No/denies pain    Home Living                      Prior Function            PT Goals (current goals can now be found in the care plan section) Acute Rehab PT Goals Patient Stated Goal: To go home  PT Goal Formulation: With patient Time For Goal Achievement: 09/20/17 Potential to Achieve Goals: Good Progress towards PT goals: Progressing toward goals    Frequency    Min 3X/week      PT Plan Current plan remains appropriate    Co-evaluation              AM-PAC PT "6 Clicks" Daily Activity  Outcome Measure  Difficulty turning over in bed (including adjusting bedclothes, sheets and blankets)?: A Lot Difficulty moving from lying on back to sitting on the side of the bed? : A Lot Difficulty sitting down on and standing up from a chair with arms (e.g., wheelchair, bedside commode, etc,.)?: A Lot Help needed moving to and from a bed to chair (including a wheelchair)?: A Lot Help needed walking  in hospital room?: A Lot Help needed climbing 3-5 steps with a railing? : Total 6 Click Score: 11    End of Session   Activity Tolerance: Patient limited by fatigue Patient left: in chair;with call bell/phone within reach;with nursing/sitter in room Nurse Communication: Mobility status PT Visit Diagnosis: Unsteadiness on feet (R26.81);Other abnormalities of gait and mobility (R26.89);Muscle weakness (generalized) (M62.81)     Time: 1537-9432 PT Time Calculation (min) (ACUTE ONLY): 21 min  Charges:  $Therapeutic Activity: 8-22 mins                    G Codes:       3:50 PM, 09/25/17 Lonell Grandchild, MPT Physical Therapist with [facility] [phone] office [phone] mobile phone

## 2017-09-16 NOTE — Progress Notes (Signed)
Ultrasound called and said since the patient has eaten today, they will have to do her abdominal ultrasound tomorrow morning.  She must also be NPO, sips with meds after midnight tonight. Have put on NPO, sips order for tomorrow.

## 2017-09-16 NOTE — Progress Notes (Signed)
Subjective: Interval History: has no complaint of nausea or vomiting.  Patient however has 2 episode of diarrhea.  She is feeling much better.  She denies any weakness.  Objective: Vital signs in last 24 hours: Temp:  [97.6 F (36.4 C)-98.6 F (37 C)] 98.6 F (37 C) (11/15 0508) Pulse Rate:  [70-93] 93 (11/15 0508) Resp:  [16-18] 18 (11/15 0508) BP: (92-117)/(42-80) 92/42 (11/15 0508) SpO2:  [92 %-100 %] 92 % (11/15 0821) Weight change:   Intake/Output from previous day: 11/14 0701 - 11/15 0700 In: 1324.2 [P.O.:720; IV Piggyback:604.2] Out: 400 [Urine:400] Intake/Output this shift: No intake/output data recorded. Generally patient is alert and in no apparent distress Chest: Decreased breath sound bilaterally, no rales or rhonchi Heart exam irevealed regular rate and rhythm no murmur Extremities she has trace edema  Lab Results: Recent Labs    09/15/17 0602 09/16/17 0545  WBC 7.4 6.0  HGB 10.5* 10.2*  HCT 31.7* 30.4*  PLT 208 185   BMET:  Recent Labs    09/15/17 1543 09/16/17 0545  NA 123* 126*  K 3.5 2.7*  CL 85* 85*  CO2 26 31  GLUCOSE 103* 111*  BUN 19 16  CREATININE 1.39* 1.34*  CALCIUM 8.9 8.2*   No results for input(s): PTH in the last 72 hours. Iron Studies: No results for input(s): IRON, TIBC, TRANSFERRIN, FERRITIN in the last 72 hours.  Studies/Results: No results found.  I have reviewed the patient's current medications.  Assessment/Plan: 1] acute kidney injury superimposed on chronic presently her renal function is improving.  Presently her renal function has returned to her baseline.  Her creatinine is 1.34. 2]chronic renal failure stage III.  3] hyponatremia: Thought to be secondary to hypovolemic hyponatremia.  Her sodium is 126 improving. 4] hypokalemia: Potassium has declined after improving. 5] hypertension: Her blood pressure is reasonably controlled 6] bone and mineral disorder: Her calcium and phosphorus is range 7] history of  CHF Plan: We will KCl 10 mEq IV x2 doses 2] we continue with oral supplement. 3] we will check her renal panel in the morning.   LOS: 5 days   Bernhardt Riemenschneider S 09/16/2017,9:33 AM

## 2017-09-16 NOTE — Clinical Social Work Note (Signed)
SW following. Pt not yet stable for dc. Update given to Mardene Celeste at [facility]. Will continue to follow for support and dc planning.

## 2017-09-17 ENCOUNTER — Inpatient Hospital Stay (HOSPITAL_COMMUNITY): Payer: Medicare Other

## 2017-09-17 LAB — RENAL FUNCTION PANEL
ANION GAP: 9 (ref 5–15)
Albumin: 2.4 g/dL — ABNORMAL LOW (ref 3.5–5.0)
BUN: 10 mg/dL (ref 6–20)
CALCIUM: 7.9 mg/dL — AB (ref 8.9–10.3)
CO2: 29 mmol/L (ref 22–32)
Chloride: 89 mmol/L — ABNORMAL LOW (ref 101–111)
Creatinine, Ser: 1.19 mg/dL — ABNORMAL HIGH (ref 0.44–1.00)
GFR, EST AFRICAN AMERICAN: 50 mL/min — AB (ref >60)
GFR, EST NON AFRICAN AMERICAN: 44 mL/min — AB (ref >60)
Glucose, Bld: 100 mg/dL — ABNORMAL HIGH (ref 65–99)
PHOSPHORUS: 3.4 mg/dL (ref 2.5–4.6)
POTASSIUM: 2.8 mmol/L — AB (ref 3.5–5.1)
Sodium: 127 mmol/L — ABNORMAL LOW (ref 135–145)

## 2017-09-17 LAB — NA AND K (SODIUM & POTASSIUM), RAND UR
Potassium Urine: 46 mmol/L
SODIUM UR: 76 mmol/L

## 2017-09-17 LAB — CBC
HEMATOCRIT: 27.1 % — AB (ref 36.0–46.0)
Hemoglobin: 9.2 g/dL — ABNORMAL LOW (ref 12.0–15.0)
MCH: 29.5 pg (ref 26.0–34.0)
MCHC: 33.9 g/dL (ref 30.0–36.0)
MCV: 86.9 fL (ref 78.0–100.0)
Platelets: 166 10*3/uL (ref 150–400)
RBC: 3.12 MIL/uL — ABNORMAL LOW (ref 3.87–5.11)
RDW: 18.7 % — AB (ref 11.5–15.5)
WBC: 5.3 10*3/uL (ref 4.0–10.5)

## 2017-09-17 LAB — COMPREHENSIVE METABOLIC PANEL
ALBUMIN: 2.4 g/dL — AB (ref 3.5–5.0)
ALT: 17 U/L (ref 14–54)
AST: 23 U/L (ref 15–41)
Alkaline Phosphatase: 144 U/L — ABNORMAL HIGH (ref 38–126)
Anion gap: 9 (ref 5–15)
BUN: 11 mg/dL (ref 6–20)
CHLORIDE: 90 mmol/L — AB (ref 101–111)
CO2: 29 mmol/L (ref 22–32)
Calcium: 8 mg/dL — ABNORMAL LOW (ref 8.9–10.3)
Creatinine, Ser: 1.19 mg/dL — ABNORMAL HIGH (ref 0.44–1.00)
GFR calc Af Amer: 50 mL/min — ABNORMAL LOW (ref >60)
GFR calc non Af Amer: 44 mL/min — ABNORMAL LOW (ref >60)
GLUCOSE: 101 mg/dL — AB (ref 65–99)
POTASSIUM: 2.8 mmol/L — AB (ref 3.5–5.1)
Sodium: 128 mmol/L — ABNORMAL LOW (ref 135–145)
Total Bilirubin: 1.1 mg/dL (ref 0.3–1.2)
Total Protein: 5.2 g/dL — ABNORMAL LOW (ref 6.5–8.1)

## 2017-09-17 LAB — PHOSPHORUS: Phosphorus: 3.4 mg/dL (ref 2.5–4.6)

## 2017-09-17 LAB — MAGNESIUM: Magnesium: 1.6 mg/dL — ABNORMAL LOW (ref 1.7–2.4)

## 2017-09-17 LAB — PROTIME-INR
INR: 2.35
Prothrombin Time: 25.5 seconds — ABNORMAL HIGH (ref 11.4–15.2)

## 2017-09-17 MED ORDER — MAGNESIUM SULFATE 4 GM/100ML IV SOLN
4.0000 g | Freq: Once | INTRAVENOUS | Status: AC
  Administered 2017-09-17: 4 g via INTRAVENOUS
  Filled 2017-09-17: qty 100

## 2017-09-17 MED ORDER — WARFARIN SODIUM 2 MG PO TABS
2.0000 mg | ORAL_TABLET | Freq: Once | ORAL | Status: AC
  Administered 2017-09-17: 2 mg via ORAL
  Filled 2017-09-17: qty 1

## 2017-09-17 MED ORDER — POTASSIUM CHLORIDE CRYS ER 20 MEQ PO TBCR
40.0000 meq | EXTENDED_RELEASE_TABLET | Freq: Once | ORAL | Status: AC
  Administered 2017-09-17: 40 meq via ORAL
  Filled 2017-09-17: qty 2

## 2017-09-17 MED ORDER — SODIUM CHLORIDE 0.9 % IV BOLUS (SEPSIS)
1000.0000 mL | Freq: Once | INTRAVENOUS | Status: AC
  Administered 2017-09-17: 1000 mL via INTRAVENOUS

## 2017-09-17 MED ORDER — POTASSIUM CHLORIDE 10 MEQ/100ML IV SOLN
10.0000 meq | INTRAVENOUS | Status: AC
  Administered 2017-09-17 (×4): 10 meq via INTRAVENOUS
  Filled 2017-09-17 (×4): qty 100

## 2017-09-17 MED ORDER — ALUM & MAG HYDROXIDE-SIMETH 200-200-20 MG/5ML PO SUSP
30.0000 mL | ORAL | Status: DC | PRN
  Administered 2017-09-17: 30 mL via ORAL
  Filled 2017-09-17: qty 30

## 2017-09-17 MED ORDER — POTASSIUM CHLORIDE CRYS ER 20 MEQ PO TBCR
40.0000 meq | EXTENDED_RELEASE_TABLET | ORAL | Status: DC
  Filled 2017-09-17: qty 2

## 2017-09-17 MED ORDER — POTASSIUM CHLORIDE IN NACL 40-0.9 MEQ/L-% IV SOLN
INTRAVENOUS | Status: DC
  Administered 2017-09-17: 125 mL/h via INTRAVENOUS
  Administered 2017-09-17: 75 mL/h via INTRAVENOUS

## 2017-09-17 NOTE — Progress Notes (Signed)
ANTICOAGULATION CONSULT NOTE - follow up  Pharmacy Consult for  COUMADIN (home med) Indication: atrial fibrillation  No Known Allergies  Patient Measurements: Height: 5\' 7"  (170.2 cm) Weight: 191 lb 2.2 oz (86.7 kg) IBW/kg (Calculated) : 61.6  Vital Signs: Temp: 98.3 F (36.8 C) (11/16 0503) Temp Source: Axillary (11/16 0503) BP: 106/67 (11/16 0503) Pulse Rate: 94 (11/16 0503)  Labs: Recent Labs    09/15/17 0602  09/16/17 0545 09/16/17 1610 09/17/17 0450  HGB 10.5*  --  10.2*  --  9.2*  HCT 31.7*  --  30.4*  --  27.1*  PLT 208  --  185  --  166  LABPROT 17.7*  --  20.3*  --  25.5*  INR 1.47  --  1.75  --  2.35  CREATININE 1.38*   < > 1.34* 1.31* 1.19*  1.19*   < > = values in this interval not displayed.   Estimated Creatinine Clearance: 46.2 mL/min (A) (by C-G formula based on SCr of 1.19 mg/dL (H)).  Assessment: INR > 9 on admission.  MD ordered Vitamin K on admission.  INR has responded nicely.  No bleeding noted, but Hgb is down. Hemoccult was negative. INR is now at goal.   Goal of Therapy:  INR 2-3 Monitor platelets by anticoagulation protocol: Yes   Plan:  Coumadin 2 mg today x 1 D/C lovenox Monitor serial labs, INR, CBC Monitor for s/sx bleeding  Isac Sarna, BS Vena [place], BCPS Clinical Pharmacist Pager [phone] 09/17/2017,8:22 AM

## 2017-09-17 NOTE — Progress Notes (Signed)
Subjective: Interval History: Patient continued to have problem with diarrhea.  According to her family's she had about 3 or 4 times yesterday and is watery.  Patient however wants to go home.  She did not have any nausea or vomiting.  Objective: Vital signs in last 24 hours: Temp:  [98.2 F (36.8 C)-98.6 F (37 C)] 98.3 F (36.8 C) (11/16 0503) Pulse Rate:  [77-94] 94 (11/16 0503) Resp:  [16] 16 (11/16 0503) BP: (106-121)/(67-74) 106/67 (11/16 0503) SpO2:  [90 %-100 %] 90 % (11/16 0852) Weight change:   Intake/Output from previous day: No intake/output data recorded. Intake/Output this shift: No intake/output data recorded. Generally patient is alert and in no apparent distress Chest: Decreased breath sound bilaterally, no rales or rhonchi Heart exam irevealed regular rate and rhythm no murmur Extremities she has trace edema  Lab Results: Recent Labs    09/16/17 0545 09/17/17 0450  WBC 6.0 5.3  HGB 10.2* 9.2*  HCT 30.4* 27.1*  PLT 185 166   BMET:  Recent Labs    09/16/17 1610 09/17/17 0450  NA 128* 127*  128*  K 3.3* 2.8*  2.8*  CL 90* 89*  90*  CO2 27 29  29   GLUCOSE 101* 100*  101*  BUN 14 10  11   CREATININE 1.31* 1.19*  1.19*  CALCIUM 8.2* 7.9*  8.0*   No results for input(s): PTH in the last 72 hours. Iron Studies: No results for input(s): IRON, TIBC, TRANSFERRIN, FERRITIN in the last 72 hours.  Studies/Results: No results found.  I have reviewed the patient's current medications.  Assessment/Plan: 1] acute kidney injury superimposed on chronic presently her renal function is improving.  Her creatinine today is 1.19. 2]chronic renal failure stage III.  3] hyponatremia: Thought to be secondary to hypovolemic hyponatremia.  Her sodium is 128 improving. 4] hypokalemia: Potassium remains low.  At this moment etiology could be secondary to GI loss.  Patient also with hypomagnesemia.  Previous workup showed urine potassium to be 15 and urine sodium  to be 10.  Normal urine magnesium was done.  This also could be associated to Protonix. 5] hypertension: Her blood pressure is reasonably controlled 6] bone and mineral disorder: Her calcium and phosphorus is range 7] history of CHF Plan: We will KCl 10 mEq IV x2 doses 2] start patient on normal saline with 40 mEq of KCl at 100 cc/h 3] we will check her renal panel, magnesium in the morning. 4] we will check her urine sodium, potassium, magnesium. 5] will DC Protonix   LOS: 6 days   Adeeb Konecny S 09/17/2017,9:21 AM

## 2017-09-17 NOTE — Clinical Social Work Note (Signed)
SW following. Discussed pt with MD who states pt is not yet stable for dc. Potential for dc over the weekend. Updated Mardene Celeste at [facility] and they will accept pt over the weekend but they must have dc summary before 11AM on Saturday regardless of Sat or Sun dc. Updated MD who stated she will get it taken care of tomorrow AM. Messages left for weekend SW to follow up tomorrow AM to send the dc summary. Pt will likely need ambulance transport. RN to call report to [facility] [phone]. Updated pt's RN today so that information can be added to pt's handoff sheet. Met with pt and family to update. Weekend SW to follow.

## 2017-09-17 NOTE — Progress Notes (Signed)
Physical Therapy Treatment Patient Details Name: [name] MRN: [id number] DOB: [birth year] Today's Date: 09/17/2017    History of Present Illness [name] is a 76 y.o. female with a history of cognitive deficits, atrial fibrillation on chronic anticoagulation, GERD, glaucoma, hypertension, neuropathy.  Due to patient's mental retardation/cognitive deficits, the history obtained by sister who takes care of the patient.  Patient lives in a trailer in the sister's backyard and is fairly unsupervised.  Over the past couple of weeks, patient has had severe urinary incontinence occurring every time she stood up.  A nurse from Dr. Lanice Shirts office came and picked up the urine on Thursday to rule out UTI.  Today, the patient asked to be taken to the doctor's office and was brought here via EMS for evaluation.  No palliating or provoking factors to the patient's urinary incontinence.  Currently, the patient was in wet shoes for the majority of the day.    PT Comments    Pt supine in bed upon therapist entrance, agreeable to participate with therapy.  Mod assistance with sit to stand with cueing for handplacement for safety.  Mod A transfer to chair.  Pt continued to fatigue early on, unable to complete gait training due to weakness and fatigue.  Pt left in chair with chair alarm set and call bell within reach.  EOS spoke to RN to advice her of pt in chair with reports of need for return to bed for procedure.  Pt agitated to return to bed partly due to fatigue and was comfortable in chair.  Mod A for return to bed with transfer for safety.  Pt left with call bell within reach and RN in room.    Follow Up Recommendations        Equipment Recommendations  None recommended by PT    Recommendations for Other Services       Precautions / Restrictions Precautions Precautions: Fall Precaution Comments: Due to recent decline in mobility.     Mobility  Bed Mobility Overal bed mobility: Needs  Assistance Bed Mobility: Supine to Sit     Supine to sit: Min guard;HOB elevated     General bed mobility comments: Pt supine in bed, cueing for UE placement to assist with supine to sit  Transfers Overall transfer level: Needs assistance Equipment used: Standard walker Transfers: Sit to/from Stand Sit to Stand: Min assist;Mod assist Stand pivot transfers: Mod assist       General transfer comment: verbal cues for proper hand placement during sit to stands  Ambulation/Gait                 Stairs            Wheelchair Mobility    Modified Rankin (Stroke Patients Only)       Balance                                            Cognition Arousal/Alertness: Awake/alert Behavior During Therapy: Agitated;Anxious Overall Cognitive Status: History of cognitive impairments - at baseline                                        Exercises General Exercises - Lower Extremity Long Arc Quad: Seated;AROM;Strengthening;Both;10 reps    General Comments  Pertinent Vitals/Pain Pain Assessment: No/denies pain    Home Living                      Prior Function            PT Goals (current goals can now be found in the care plan section)      Frequency    Min 3X/week      PT Plan Current plan remains appropriate    Co-evaluation              AM-PAC PT "6 Clicks" Daily Activity  Outcome Measure  Difficulty turning over in bed (including adjusting bedclothes, sheets and blankets)?: A Lot Difficulty moving from lying on back to sitting on the side of the bed? : A Lot Difficulty sitting down on and standing up from a chair with arms (e.g., wheelchair, bedside commode, etc,.)?: A Lot Help needed moving to and from a bed to chair (including a wheelchair)?: A Lot Help needed walking in hospital room?: A Lot Help needed climbing 3-5 steps with a railing? : Total 6 Click Score: 11    End of Session  Equipment Utilized During Treatment: Gait belt Activity Tolerance: Patient limited by fatigue;Other (comment)(pt agitate with return from sitting to bed for RN procedure) Patient left: in bed;with bed alarm set;with call bell/phone within reach;with nursing/sitter in room Nurse Communication: Mobility status PT Visit Diagnosis: Unsteadiness on feet (R26.81);Other abnormalities of gait and mobility (R26.89);Muscle weakness (generalized) (M62.81)     Time: 7121-9758 PT Time Calculation (min) (ACUTE ONLY): 30 min  Charges:  $Therapeutic Activity: 23-37 mins                    G Codes:      Ihor Austin, LPTA; CBIS [phone]  [name] 09/17/2017, 4:49 PM

## 2017-09-17 NOTE — Progress Notes (Signed)
PROGRESS NOTE    [name]   CWC:[id number]  DOB: [birth year]  DOA: 09/11/2017 PCP: Doree Albee, MD   Brief Narrative:  [name] a 76 y.o.femalewith a history of cognitive deficits, atrial fibrillation on chronic anticoagulation, GERD, glaucoma, hypertension,neuropathy. Due to patient'sintellectual disability/cognitive deficits, the history obtained by sister who takes care of the patient. Patient lives in a trailer in the sister's backyard and is fairly unsupervised. Over the past couple of weeks, patient has had severe urinary incontinence occurring every time she stood up and this is the reason she was brought to the ER. A UA was collect by [facility] to r/o UTI but no results obtained yet. The patient was noted to come into the ER with wet shoes. Per sister, she puts patient's meds in a pill box and patient takes them on her own.  Labs in ER:  NA+ 120, K 1.4, Cl+ 62, CO2, 40, BUN 65, CR 2.80, TnI 0.06, Lactic acid 2.1, INR 8.10 UA mod, Hb, rare bacteria, TNTC sq epithelial cells and 0-5 WBC  Subjective: No complaints.     Assessment & Plan:  Severe electrolyte abnormalities, dehydration and AKI on CKD 3  Contraction alkalosis - d/c Lasix  - repeat ECHO shows normal EF now - given another 500 cc bolus of NS this AM- cont slow IVF - electrolytes(sodium, K, Mg and phos) being replaced- see labs below - cont to replace daily  - K is 2.8 today despite replacement likely because of large 3 BMs in the past 24 hrs - renal ultrasound: small kidneys with cortical thinning  Supra therapeutic INR - total of 8 mg Vit K given and  INR subsequently < 2 - pharmacy dosing coumadin - now therapeutic  ? Black BM x 2 11/14 - hemoccult neg-  Hb stable  Elevated Alk phos and T bili - f/u abd ultrasound- T bili down to 1 today - it did improve to 1 previously and then rose ot 1.5 again- follow  Bruising around PICC - we do have one IV line however, as we need good IV access, I  prefer not like to remove the PICC- I have asked RN to ask the PICC team to make sure it is working properly    Hyperbilirubinemia - with mildly elevated Alk phos- new finding- check liver/ gallbladder ultrasound today  Urinary incontinence - not very severe- patient is able to tell when she needs to void   A-fib- permanent  - cont Metoprolol and Coumadin- rate controlled   Mental retardation  Moisture wounds and blisters on legs and feet - appreciate wound care eval-     Elevated troponin - mild elevation and flat trend- no further work up needed    Vulvovaginitis due to yeast - Diflucan  Gout - allopurinol   DVT prophylaxis: Coumadin Code Status: Full code Family Communication: neice Disposition Plan: SNF Consultants:   none Procedures:   2 D ECHO  Upper normal LV wall thickness with LVEF 55-60%. Indeterminate   diastolic function. Moderate left atrial enlargement. Mildly   calcified mitral annulus with mild mitral regurgitation. Mild   calcified aortic annulus with mild aortic regurgitation. Mild   tricuspid regurgitation with PASP estimated 36 mmHg. Mild right   atrial enlargement. Prominent pericardial fat pad.  Antimicrobials:  Anti-infectives (From admission, onward)   Start     Dose/Rate Route Frequency Ordered Stop   09/12/17 0024  fluconazole (DIFLUCAN) tablet 150 mg     150 mg Oral Every  3 DAYS 09/12/17 0024 09/21/17 0029       Objective: Vitals:   09/16/17 1930 09/16/17 2115 09/17/17 0503 09/17/17 0852  BP:  121/73 106/67   Pulse:  85 94   Resp:  16 16   Temp:  98.6 F (37 C) 98.3 F (36.8 C)   TempSrc:  Axillary Axillary   SpO2: 94% 100% 95% 90%  Weight:      Height:        Intake/Output Summary (Last 24 hours) at 09/17/2017 1125 Last data filed at 09/17/2017 1058 Gross per 24 hour  Intake 10 ml  Output -  Net 10 ml   Filed Weights   09/12/17 0023 09/12/17 0400 09/14/17 0500  Weight: 83.4 kg (183 lb 13.8 oz) 83.4 kg (183 lb 13.8  oz) 86.7 kg (191 lb 2.2 oz)    Examination: General exam: Appears comfortable  HEENT: PERRLA, oral mucosa dry, no sclera icterus or thrush Respiratory system: Clear to auscultation. Respiratory effort normal. Cardiovascular system: S1 & S2 heard, RRR.  No murmurs  Gastrointestinal system: Abdomen soft, non-tender, nondistended. Normal bowel sound. No organomegaly Central nervous system: Alert. No focal neurological deficits. Oriented to person and place only. Extremities: No cyanosis, clubbing or edema- bruising around PICC line with mild edema noted- good pedal pulses Skin: numerous scabs, open sores and blisters on feet and legs steadily healing- dusky discoloration of legs is unchanged Psychiatry:  Mood & affect appropriate.     Data Reviewed: I have personally reviewed following labs and imaging studies  CBC: Recent Labs  Lab 09/11/17 1919 09/13/17 0442 09/14/17 0410 09/15/17 0602 09/16/17 0545 09/17/17 0450  WBC 9.8 6.4 7.6 7.4 6.0 5.3  NEUTROABS 8.8*  --   --   --   --   --   HGB 12.2 10.2* 10.1* 10.5* 10.2* 9.2*  HCT 35.5* 30.7* 30.9* 31.7* 30.4* 27.1*  MCV 83.9 87.0 87.8 87.6 86.4 86.9  PLT 254 181 213 208 185 235   Basic Metabolic Panel: Recent Labs  Lab 09/13/17 0442 09/14/17 0410 09/15/17 0602 09/15/17 1543 09/16/17 0545 09/16/17 1610 09/17/17 0450  NA 126*  128* 128* 127* 123* 126* 128* 127*  128*  K 3.1*  3.3* 4.2 3.7 3.5 2.7* 3.3* 2.8*  2.8*  CL 84*  85* 91* 87* 85* 85* 90* 89*  90*  CO2 34*  35* 31 31 26 31 27 29  29   GLUCOSE 118*  118* 96 89 103* 111* 101* 100*  101*  BUN 41*  42* 29* 22* 19 16 14 10  11   CREATININE 1.66*  1.60* 1.32* 1.38* 1.39* 1.34* 1.31* 1.19*  1.19*  CALCIUM 8.3*  8.4* 8.5* 8.9 8.9 8.2* 8.2* 7.9*  8.0*  MG 1.8 1.5* 1.7  --  1.9  --  1.6*  PHOS 1.7*  --  1.5*  --  2.8  --  3.4  3.4   GFR: Estimated Creatinine Clearance: 46.2 mL/min (A) (by C-G formula based on SCr of 1.19 mg/dL (H)). Liver Function  Tests: Recent Labs  Lab 09/13/17 0442 09/14/17 0410 09/15/17 0602 09/16/17 0545 09/17/17 0450  AST 36 29 30 29 23   ALT 16 17 19 17 17   ALKPHOS 129* 128* 158* 153* 144*  BILITOT 1.0 1.0 1.4* 1.5* 1.1  PROT 5.6* 5.6* 5.9* 5.6* 5.2*  ALBUMIN 2.6*  2.5* 2.6* 2.8* 2.5* 2.4*  2.4*   No results for input(s): LIPASE, AMYLASE in the last 168 hours. No results for input(s): AMMONIA in the last  168 hours. Coagulation Profile: Recent Labs  Lab 09/13/17 0442 09/14/17 0410 09/15/17 0602 09/16/17 0545 09/17/17 0450  INR 2.43 1.33 1.47 1.75 2.35   Cardiac Enzymes: Recent Labs  Lab 09/11/17 1919 09/11/17 2124 09/13/17 0442  CKTOTAL  --  259* 90  TROPONINI 0.06* 0.06*  --    BNP (last 3 results) No results for input(s): PROBNP in the last 8760 hours. HbA1C: No results for input(s): HGBA1C in the last 72 hours. CBG: No results for input(s): GLUCAP in the last 168 hours. Lipid Profile: No results for input(s): CHOL, HDL, LDLCALC, TRIG, CHOLHDL, LDLDIRECT in the last 72 hours. Thyroid Function Tests: No results for input(s): TSH, T4TOTAL, FREET4, T3FREE, THYROIDAB in the last 72 hours. Anemia Panel: No results for input(s): VITAMINB12, FOLATE, FERRITIN, TIBC, IRON, RETICCTPCT in the last 72 hours. Urine analysis:    Component Value Date/Time   COLORURINE YELLOW 09/11/2017 1930   APPEARANCEUR CLOUDY (A) 09/11/2017 1930   LABSPEC 1.011 09/11/2017 1930   PHURINE 5.0 09/11/2017 1930   GLUCOSEU NEGATIVE 09/11/2017 1930   HGBUR MODERATE (A) 09/11/2017 1930   BILIRUBINUR NEGATIVE 09/11/2017 1930   KETONESUR NEGATIVE 09/11/2017 1930   PROTEINUR NEGATIVE 09/11/2017 1930   NITRITE NEGATIVE 09/11/2017 1930   LEUKOCYTESUR NEGATIVE 09/11/2017 1930   Sepsis Labs: @LABRCNTIP (procalcitonin:4,lacticidven:4) ) Recent Results (from the past 240 hour(s))  Urine culture     Status: None   Collection Time: 09/11/17  7:30 PM  Result Value Ref Range Status   Specimen Description URINE,  CATHETERIZED  Final   Special Requests NONE  Final   Culture   Final    NO GROWTH Performed at [facility], [address] [place], [place] [address]    Report Status 09/13/2017 FINAL  Final  MRSA PCR Screening     Status: None   Collection Time: 09/12/17  1:30 AM  Result Value Ref Range Status   MRSA by PCR NEGATIVE NEGATIVE Final    Comment:        The GeneXpert MRSA Assay (FDA approved for NASAL specimens only), is one component of a comprehensive MRSA colonization surveillance program. It is not intended to diagnose MRSA infection nor to guide or monitor treatment for MRSA infections.          Radiology Studies: No results found.    Scheduled Meds: . allopurinol  100 mg Oral QPM  . Chlorhexidine Gluconate Cloth  6 each Topical Daily  . diphenhydrAMINE  25 mg Oral Once  . fluconazole  150 mg Oral Q3 days  . gabapentin  300 mg Oral QHS  . latanoprost  1 drop Both Eyes QHS  . mouth rinse  15 mL Mouth Rinse BID  . metoprolol  150 mg Oral Daily  . sodium chloride flush  10-40 mL Intracatheter Q12H  . sodium chloride flush  3 mL Intravenous Q12H  . sodium chloride  1 g Oral TID WC  . traZODone  100 mg Oral QHS  . warfarin  2 mg Oral Once  . Warfarin - Pharmacist Dosing Inpatient   Does not apply Q24H   Continuous Infusions: . 0.9 % NaCl with KCl 40 mEq / L 75 mL/hr (09/17/17 1052)  . potassium chloride 10 mEq (09/17/17 1044)     LOS: 6 days    Time spent in minutes: 35    Debbe Odea, MD [facility] Pager: [email or web address] Password [email or web address] 09/17/2017, 11:25 AM

## 2017-09-18 DIAGNOSIS — N179 Acute kidney failure, unspecified: Secondary | ICD-10-CM

## 2017-09-18 DIAGNOSIS — E878 Other disorders of electrolyte and fluid balance, not elsewhere classified: Secondary | ICD-10-CM

## 2017-09-18 DIAGNOSIS — R945 Abnormal results of liver function studies: Secondary | ICD-10-CM

## 2017-09-18 DIAGNOSIS — F79 Unspecified intellectual disabilities: Secondary | ICD-10-CM

## 2017-09-18 DIAGNOSIS — R7989 Other specified abnormal findings of blood chemistry: Secondary | ICD-10-CM

## 2017-09-18 DIAGNOSIS — N189 Chronic kidney disease, unspecified: Secondary | ICD-10-CM

## 2017-09-18 DIAGNOSIS — R748 Abnormal levels of other serum enzymes: Secondary | ICD-10-CM

## 2017-09-18 LAB — RENAL FUNCTION PANEL
Albumin: 2.4 g/dL — ABNORMAL LOW (ref 3.5–5.0)
Anion gap: 6 (ref 5–15)
BUN: 7 mg/dL (ref 6–20)
CALCIUM: 8.1 mg/dL — AB (ref 8.9–10.3)
CO2: 26 mmol/L (ref 22–32)
CREATININE: 1.16 mg/dL — AB (ref 0.44–1.00)
Chloride: 99 mmol/L — ABNORMAL LOW (ref 101–111)
GFR, EST AFRICAN AMERICAN: 52 mL/min — AB (ref >60)
GFR, EST NON AFRICAN AMERICAN: 45 mL/min — AB (ref >60)
Glucose, Bld: 97 mg/dL (ref 65–99)
Phosphorus: 2.1 mg/dL — ABNORMAL LOW (ref 2.5–4.6)
Potassium: 4.6 mmol/L (ref 3.5–5.1)
SODIUM: 131 mmol/L — AB (ref 135–145)

## 2017-09-18 LAB — PROTIME-INR
INR: 2.78
Prothrombin Time: 29.1 seconds — ABNORMAL HIGH (ref 11.4–15.2)

## 2017-09-18 LAB — PHOSPHORUS: Phosphorus: 2.1 mg/dL — ABNORMAL LOW (ref 2.5–4.6)

## 2017-09-18 LAB — MAGNESIUM: MAGNESIUM: 2.1 mg/dL (ref 1.7–2.4)

## 2017-09-18 MED ORDER — TRAMADOL HCL 50 MG PO TABS: 50.0000 mg | ORAL_TABLET | Freq: Four times a day (QID) | ORAL | 0 refills | Status: DC | PRN

## 2017-09-18 MED ORDER — METOPROLOL SUCCINATE ER 50 MG PO TB24: 150.0000 mg | ORAL_TABLET | Freq: Every day | ORAL | Status: DC

## 2017-09-18 MED ORDER — TRAZODONE HCL 100 MG PO TABS: 100.0000 mg | ORAL_TABLET | Freq: Every evening | ORAL | Status: DC | PRN

## 2017-09-18 MED ORDER — ALUM & MAG HYDROXIDE-SIMETH 200-200-20 MG/5ML PO SUSP: 30.0000 mL | ORAL | 0 refills | Status: DC | PRN

## 2017-09-18 MED ORDER — SODIUM CHLORIDE 0.9 % IV SOLN
INTRAVENOUS | Status: DC
  Administered 2017-09-18 – 2017-09-20 (×3): via INTRAVENOUS

## 2017-09-18 MED ORDER — WARFARIN SODIUM 1 MG PO TABS
1.0000 mg | ORAL_TABLET | Freq: Once | ORAL | Status: AC
  Administered 2017-09-18: 1 mg via ORAL
  Filled 2017-09-18: qty 1

## 2017-09-18 MED ORDER — LISINOPRIL 20 MG PO TABS: 20.0000 mg | ORAL_TABLET | Freq: Every day | ORAL | 6 refills | Status: DC

## 2017-09-18 NOTE — Progress Notes (Signed)
Subjective: Interval History: Patient denies any difficulty in breathing.  Patient also denies any nausea or vomiting.  Objective: Vital signs in last 24 hours: Temp:  [97.6 F (36.4 C)-98.3 F (36.8 C)] 97.6 F (36.4 C) (11/17 0645) Pulse Rate:  [87-99] 90 (11/17 0645) Resp:  [16-18] 18 (11/17 0645) BP: (115-129)/(69-85) 121/71 (11/17 0645) SpO2:  [97 %-100 %] 97 % (11/17 0645) Weight change:   Intake/Output from previous day: 11/16 0701 - 11/17 0700 In: 2092.5 [P.O.:460; I.V.:132.5; IV Piggyback:1500] Out: -  Intake/Output this shift: No intake/output data recorded. Generally patient is alert and in no apparent distress Chest: Decreased breath sound bilaterally, no rales or rhonchi Heart exam irevealed regular rate and rhythm no murmur Extremities she has trace edema  Lab Results: Recent Labs    09/16/17 0545 09/17/17 0450  WBC 6.0 5.3  HGB 10.2* 9.2*  HCT 30.4* 27.1*  PLT 185 166   BMET:  Recent Labs    09/17/17 0450 09/18/17 0656  NA 127*  128* 131*  K 2.8*  2.8* 4.6  CL 89*  90* 99*  CO2 29  29 26   GLUCOSE 100*  101* 97  BUN 10  11 7   CREATININE 1.19*  1.19* 1.16*  CALCIUM 7.9*  8.0* 8.1*   No results for input(s): PTH in the last 72 hours. Iron Studies: No results for input(s): IRON, TIBC, TRANSFERRIN, FERRITIN in the last 72 hours.  Studies/Results: US Abdomen Limited Ruq  Result Date: 09/17/2017 CLINICAL DATA:  Elevated liver enzymes EXAM: ULTRASOUND ABDOMEN LIMITED RIGHT UPPER QUADRANT COMPARISON:  None. FINDINGS: Gallbladder: Not visualized.  Question absence. Common bile duct: Diameter: 5 mm. No intrahepatic or extrahepatic biliary duct dilatation. Liver: No focal lesion identified. The liver echogenicity increased diffusely. Portal vein is patent on color Doppler imaging with normal direction of blood flow towards the liver. IMPRESSION: 1. Gallbladder not seen. Question absence versus severely contracted gallbladder. Note that there is gas  overlying the gallbladder fossa. 2. Diffuse increase in liver echotexture, a finding most likely indicative of hepatic steatosis. While no focal liver lesions are evident, it must be cautioned that the sensitivity of ultrasound for detection of focal liver lesions is diminished in this circumstance. Electronically Signed   By: Lowella Grip III M.D.   On: 09/17/2017 12:06    I have reviewed the patient's current medications.  Assessment/Plan: 1] acute kidney injury superimposed on chronic presently her renal function is improving.  Her creatinine today is 1.16.  Presently has returned to her baseline.. 2]chronic renal failure stage III.  3] hyponatremia: Thought to be secondary to hypovolemic hyponatremia.  Her sodium is 131 has improved. 4] hypokalemia: Potassium is normal today.  Patient is on potassium supplement. 5] hypertension: Her blood pressure is reasonably controlled 6] bone and mineral disorder: Her calcium and phosphorus is range 7] history of CHF: Denies any difficulty breathing. Plan: We will change IV fluid to normal saline at 50 cc/h 2] since patient renal function has recovered I will sign off.  Thank you for letting me participate in her care.   LOS: 7 days   Zaidin Blyden S 09/18/2017,9:24 AM

## 2017-09-18 NOTE — Progress Notes (Signed)
Late entry:  Per SW note, [facility] needed discharge summary before 1100 today in order for patient to be admitted over the weekend.  Message left with SW at [facility].  Dr. Wynelle Cleveland called and notified.  Dr. Wynelle Cleveland gave order to discontinue enteric, CDiff specimen, flexi-seal.  Dr. Wynelle Cleveland gave order to leave PICC line in place and not remove.

## 2017-09-18 NOTE — Plan of Care (Signed)
Pt in bed alert and oriented x3. Family at bedside. Able to make needs known. Picc to right upper arm with NS and 40K going at 125. 20G to left AC saline locked. Flexiseal in place. Skin breakdown to buttucks and inner thighs. Pt on isolation for enteric precautions. Dressing to right foot and leg in place. Call light within reach. Will continue to monitor.

## 2017-09-18 NOTE — Progress Notes (Signed)
ANTICOAGULATION CONSULT NOTE - follow up  Pharmacy Consult for  COUMADIN (home med) Indication: atrial fibrillation  No Known Allergies  Patient Measurements: Height: 5\' 7"  (170.2 cm) Weight: 191 lb 2.2 oz (86.7 kg) IBW/kg (Calculated) : 61.6  Vital Signs: Temp: 97.6 F (36.4 C) (11/17 0645) Temp Source: Oral (11/17 0645) BP: 121/71 (11/17 0645) Pulse Rate: 90 (11/17 0645)  Labs: Recent Labs    09/16/17 0545 09/16/17 1610 09/17/17 0450 09/18/17 0656  HGB 10.2*  --  9.2*  --   HCT 30.4*  --  27.1*  --   PLT 185  --  166  --   LABPROT 20.3*  --  25.5* 29.1*  INR 1.75  --  2.35 2.78  CREATININE 1.34* 1.31* 1.19*  1.19* 1.16*   Estimated Creatinine Clearance: 47.4 mL/min (A) (by C-G formula based on SCr of 1.16 mg/dL (H)).  Assessment: INR > 9 on admission.  MD ordered Vitamin K on admission.  INR has responded nicely.  No bleeding noted, but Hgb is down. Hemoccult was negative. INR is now at goal but quickly rising.  Goal of Therapy:  INR 2-3 Monitor platelets by anticoagulation protocol: Yes   Plan:  Coumadin 1 mg today x 1 Monitor serial labs, INR, CBC Monitor for s/sx bleeding  Isac Sarna, BS [place], BCPS Clinical Pharmacist Pager [phone] 09/18/2017,9:40 AM

## 2017-09-19 LAB — BASIC METABOLIC PANEL
Anion gap: 5 (ref 5–15)
BUN: 6 mg/dL (ref 6–20)
CALCIUM: 8.2 mg/dL — AB (ref 8.9–10.3)
CO2: 24 mmol/L (ref 22–32)
Chloride: 104 mmol/L (ref 101–111)
Creatinine, Ser: 1.08 mg/dL — ABNORMAL HIGH (ref 0.44–1.00)
GFR calc Af Amer: 57 mL/min — ABNORMAL LOW (ref >60)
GFR, EST NON AFRICAN AMERICAN: 49 mL/min — AB (ref >60)
GLUCOSE: 101 mg/dL — AB (ref 65–99)
Potassium: 4.4 mmol/L (ref 3.5–5.1)
Sodium: 133 mmol/L — ABNORMAL LOW (ref 135–145)

## 2017-09-19 LAB — MAGNESIUM, URINE: MAGNESIUM, URINE - MGUR: 31.4 mg/dL

## 2017-09-19 LAB — PROTIME-INR
INR: 3.6
Prothrombin Time: 35.6 seconds — ABNORMAL HIGH (ref 11.4–15.2)

## 2017-09-19 LAB — PHOSPHORUS
PHOSPHORUS: 2.3 mg/dL — AB (ref 2.5–4.6)
Phosphorus: 2.4 mg/dL — ABNORMAL LOW (ref 2.5–4.6)

## 2017-09-19 LAB — MAGNESIUM: Magnesium: 1.7 mg/dL (ref 1.7–2.4)

## 2017-09-19 NOTE — Progress Notes (Signed)
Patient repeatedly sitting up on edge of bed, even with assistance to lay back with [facility] elevated and education of fall risk and safety concerns. Patient repeatedly states that she is not going to fall. Patient not trying to standing, just remains seated on side of bed. Patient slightly irritable. Bed alarm on. Will continue to monitor.

## 2017-09-19 NOTE — Progress Notes (Signed)
ANTICOAGULATION CONSULT NOTE - follow up  Pharmacy Consult for  COUMADIN (home med) Indication: atrial fibrillation  No Known Allergies  Patient Measurements: Height: 5\' 7"  (170.2 cm) Weight: 205 lb (93 kg) IBW/kg (Calculated) : 61.6  Vital Signs: Temp: 97.7 F (36.5 C) (11/18 0500) Temp Source: Oral (11/18 0500) BP: 118/77 (11/18 0500) Pulse Rate: 82 (11/18 0500)  Labs: Recent Labs    09/16/17 1610 09/17/17 0450 09/18/17 0656 09/19/17 0621  HGB  --  9.2*  --   --   HCT  --  27.1*  --   --   PLT  --  166  --   --   LABPROT  --  25.5* 29.1* 35.6*  INR  --  2.35 2.78 3.60  CREATININE 1.31* 1.19*  1.19* 1.16*  --    Estimated Creatinine Clearance: 49.1 mL/min (A) (by C-G formula based on SCr of 1.16 mg/dL (H)).  Assessment: INR > 9 on admission.  MD ordered Vitamin K on admission.  INR has responded nicely.  No bleeding noted, but Hgb is down. Hemoccult was negative. INR is supratherapeutic.  Goal of Therapy:  INR 2-3 Monitor platelets by anticoagulation protocol: Yes   Plan:  No coumadin today Daily INR Monitor for s/sx bleeding  Isac Sarna, BS [place], BCPS Clinical Pharmacist Pager [phone] 09/19/2017,9:27 AM

## 2017-09-19 NOTE — Progress Notes (Signed)
PROGRESS NOTE    [name]   DGU:[id number]  DOB: Aug 25, 1941  DOA: 09/11/2017 PCP: Doree Albee, MD   Brief Narrative:  [name] a 76 y.o.femalewith a history of cognitive deficits, atrial fibrillation on chronic anticoagulation, GERD, glaucoma, hypertension,neuropathy. Due to patient'sintellectual disability/cognitive deficits, the history obtained by sister who takes care of the patient. Patient lives in a trailer in the sister's backyard and is fairly unsupervised. Over the past couple of weeks, patient has had severe urinary incontinence occurring every time she stood up and this is the reason she was brought to the ER. A UA was collect by [facility] to r/o UTI but no results obtained yet. The patient was noted to come into the ER with wet shoes. Per sister, she puts patient's meds in a pill box and patient takes them on her own.  Labs in ER:  NA+ 120, K 1.4, Cl+ 62, CO2, 40, BUN 65, CR 2.80, TnI 0.06, Lactic acid 2.1, INR 8.10 UA mod, Hb, rare bacteria, TNTC sq epithelial cells and 0-5 WBC  Subjective: No complaints.     Assessment & Plan:  Severe electrolyte abnormalities, dehydration and AKI on CKD 3  Contraction alkalosis - d/c Lasix  - repeat ECHO shows normal EF now - given another 500 cc bolus of NS this AM- cont slow IVF - electrolytes(sodium, K, Mg and phos) all low - renal ultrasound: small kidneys with cortical thinning - problems with electrolyte disturbances exacerbated by diarrhea while in the hospital which has resolved - PPI placed on hold due to diarrhea and hypomag- hold off on resuming for now  Supra therapeutic INR - total of 8 mg Vit K given and  INR subsequently < 2 - pharmacy dosing coumadin - now supra-therapeutic  ? Black BM x 2 11/14 - hemoccult neg-  Hb stable  Elevated Alk phos and T bili - abd ultrasound done and gallbladder not visualized - suspected to either be contracted or obscured by gas- fatty liver suspected  Bruising  around PICC - PICC working appropriately  Urinary incontinence - possibly due to Lasix in setting of weakness at home - not very severe in the hospital- patient is able to tell when she needs to void but if there is a delay in taking her to the bathroom, she will void on herself - using external female catheter  A-fib- permanent  - cont Metoprolol and Coumadin- rate controlled   Mental retardation  Mental retardation - able to understand quite a bit (ie. Was able to tell me that she was NPO for a test on her abdomen) but has some Impulsive behaviors and is not oriented to place  Moisture wounds and blisters on legs and feet - due to feet being soaked in urine - appreciate wound care eval-xeroform gauze to blistered areas- change QID and wrap with conform gauze   Vulvovaginitis due to yeast - Diflucan  Gout - allopurinol   DVT prophylaxis: Coumadin Code Status: Full code Family Communication: neice Disposition Plan: SNF Consultants:   none Procedures:   2 D ECHO  Upper normal LV wall thickness with LVEF 55-60%. Indeterminate   diastolic function. Moderate left atrial enlargement. Mildly   calcified mitral annulus with mild mitral regurgitation. Mild   calcified aortic annulus with mild aortic regurgitation. Mild   tricuspid regurgitation with PASP estimated 36 mmHg. Mild right   atrial enlargement. Prominent pericardial fat pad.  Antimicrobials:  Anti-infectives (From admission, onward)   Start  Dose/Rate Route Frequency Ordered Stop   09/12/17 0024  fluconazole (DIFLUCAN) tablet 150 mg     150 mg Oral Every 3 DAYS 09/12/17 0024 09/18/17 0104       Objective: Vitals:   09/18/17 1400 09/18/17 2103 09/18/17 2141 09/19/17 0500  BP: 124/71  (!) 141/96 118/77  Pulse: 87  95 82  Resp: 20  19 18   Temp: 98.1 F (36.7 C)  (!) 97.5 F (36.4 C) 97.7 F (36.5 C)  TempSrc:   Axillary Oral  SpO2: 96% 97% 97% 96%  Weight:    93 kg (205 lb)  Height:         Intake/Output Summary (Last 24 hours) at 09/19/2017 1439 Last data filed at 09/19/2017 0700 Gross per 24 hour  Intake 960 ml  Output 850 ml  Net 110 ml   Filed Weights   09/12/17 0400 09/14/17 0500 09/19/17 0500  Weight: 83.4 kg (183 lb 13.8 oz) 86.7 kg (191 lb 2.2 oz) 93 kg (205 lb)    Examination: General exam: Appears comfortable  HEENT: PERRLA, oral mucosa dry, no sclera icterus or thrush Respiratory system: Clear to auscultation. Respiratory effort normal. Cardiovascular system: S1 & S2 heard, RRR.  No murmurs  Gastrointestinal system: Abdomen soft, non-tender, nondistended. Normal bowel sound. No organomegaly Central nervous system: Alert. No focal neurological deficits. Oriented to person and place only. Extremities: No cyanosis, clubbing or edema- bruising around PICC line -  edema improving - good pedal pulses Skin: numerous scabs, open sores and blisters on feet and legs steadily healing- dusky discoloration of legs is unchanged Psychiatry:  Mood & affect appropriate.     Data Reviewed: I have personally reviewed following labs and imaging studies  CBC: Recent Labs  Lab 09/13/17 0442 09/14/17 0410 09/15/17 0602 09/16/17 0545 09/17/17 0450  WBC 6.4 7.6 7.4 6.0 5.3  HGB 10.2* 10.1* 10.5* 10.2* 9.2*  HCT 30.7* 30.9* 31.7* 30.4* 27.1*  MCV 87.0 87.8 87.6 86.4 86.9  PLT 181 213 208 185 811   Basic Metabolic Panel: Recent Labs  Lab 09/15/17 0602  09/16/17 0545 09/16/17 1610 09/17/17 0450 09/18/17 0656 09/19/17 0621 09/19/17 1025  NA 127*   < > 126* 128* 127*  128* 131*  --  133*  K 3.7   < > 2.7* 3.3* 2.8*  2.8* 4.6  --  4.4  CL 87*   < > 85* 90* 89*  90* 99*  --  104  CO2 31   < > 31 27 29  29 26   --  24  GLUCOSE 89   < > 111* 101* 100*  101* 97  --  101*  BUN 22*   < > 16 14 10  11 7   --  6  CREATININE 1.38*   < > 1.34* 1.31* 1.19*  1.19* 1.16*  --  1.08*  CALCIUM 8.9   < > 8.2* 8.2* 7.9*  8.0* 8.1*  --  8.2*  MG 1.7  --  1.9  --  1.6*  2.1  --  1.7  PHOS 1.5*  --  2.8  --  3.4  3.4 2.1*  2.1* 2.3* 2.4*   < > = values in this interval not displayed.   GFR: Estimated Creatinine Clearance: 52.7 mL/min (A) (by C-G formula based on SCr of 1.08 mg/dL (H)). Liver Function Tests: Recent Labs  Lab 09/13/17 0442 09/14/17 0410 09/15/17 0602 09/16/17 0545 09/17/17 0450 09/18/17 0656  AST 36 29 30 29 23   --  ALT 16 17 19 17 17   --   ALKPHOS 129* 128* 158* 153* 144*  --   BILITOT 1.0 1.0 1.4* 1.5* 1.1  --   PROT 5.6* 5.6* 5.9* 5.6* 5.2*  --   ALBUMIN 2.6*  2.5* 2.6* 2.8* 2.5* 2.4*  2.4* 2.4*   No results for input(s): LIPASE, AMYLASE in the last 168 hours. No results for input(s): AMMONIA in the last 168 hours. Coagulation Profile: Recent Labs  Lab 09/15/17 0602 09/16/17 0545 09/17/17 0450 09/18/17 0656 09/19/17 0621  INR 1.47 1.75 2.35 2.78 3.60   Cardiac Enzymes: Recent Labs  Lab 09/13/17 0442  CKTOTAL 90   BNP (last 3 results) No results for input(s): PROBNP in the last 8760 hours. HbA1C: No results for input(s): HGBA1C in the last 72 hours. CBG: No results for input(s): GLUCAP in the last 168 hours. Lipid Profile: No results for input(s): CHOL, HDL, LDLCALC, TRIG, CHOLHDL, LDLDIRECT in the last 72 hours. Thyroid Function Tests: No results for input(s): TSH, T4TOTAL, FREET4, T3FREE, THYROIDAB in the last 72 hours. Anemia Panel: No results for input(s): VITAMINB12, FOLATE, FERRITIN, TIBC, IRON, RETICCTPCT in the last 72 hours. Urine analysis:    Component Value Date/Time   COLORURINE YELLOW 09/11/2017 1930   APPEARANCEUR CLOUDY (A) 09/11/2017 1930   LABSPEC 1.011 09/11/2017 1930   PHURINE 5.0 09/11/2017 1930   GLUCOSEU NEGATIVE 09/11/2017 1930   HGBUR MODERATE (A) 09/11/2017 1930   BILIRUBINUR NEGATIVE 09/11/2017 1930   KETONESUR NEGATIVE 09/11/2017 1930   PROTEINUR NEGATIVE 09/11/2017 1930   NITRITE NEGATIVE 09/11/2017 1930   LEUKOCYTESUR NEGATIVE 09/11/2017 1930   Sepsis  Labs: @LABRCNTIP (procalcitonin:4,lacticidven:4) ) Recent Results (from the past 240 hour(s))  Urine culture     Status: None   Collection Time: 09/11/17  7:30 PM  Result Value Ref Range Status   Specimen Description URINE, CATHETERIZED  Final   Special Requests NONE  Final   Culture   Final    NO GROWTH Performed at [facility], [place] [address] [place], [place] [address]    Report Status 09/13/2017 FINAL  Final  MRSA PCR Screening     Status: None   Collection Time: 09/12/17  1:30 AM  Result Value Ref Range Status   MRSA by PCR NEGATIVE NEGATIVE Final    Comment:        The GeneXpert MRSA Assay (FDA approved for NASAL specimens only), is one component of a comprehensive MRSA colonization surveillance program. It is not intended to diagnose MRSA infection nor to guide or monitor treatment for MRSA infections.          Radiology Studies: No results found.    Scheduled Meds: . allopurinol  100 mg Oral QPM  . diphenhydrAMINE  25 mg Oral Once  . gabapentin  300 mg Oral QHS  . latanoprost  1 drop Both Eyes QHS  . mouth rinse  15 mL Mouth Rinse BID  . metoprolol  150 mg Oral Daily  . sodium chloride flush  10-40 mL Intracatheter Q12H  . sodium chloride flush  3 mL Intravenous Q12H  . sodium chloride  1 g Oral TID WC  . traZODone  100 mg Oral QHS  . Warfarin - Pharmacist Dosing Inpatient   Does not apply Q24H   Continuous Infusions: . sodium chloride 50 mL/hr at 09/19/17 0622     LOS: 8 days    Time spent in minutes: 35    Debbe Odea, MD [facility] Pager: [email or web address] Password [facility] 09/19/2017,  2:39 PM

## 2017-09-20 DIAGNOSIS — R41841 Cognitive communication deficit: Secondary | ICD-10-CM | POA: Diagnosis not present

## 2017-09-20 DIAGNOSIS — R748 Abnormal levels of other serum enzymes: Secondary | ICD-10-CM | POA: Diagnosis not present

## 2017-09-20 DIAGNOSIS — F0390 Unspecified dementia without behavioral disturbance: Secondary | ICD-10-CM | POA: Diagnosis not present

## 2017-09-20 DIAGNOSIS — Z7901 Long term (current) use of anticoagulants: Secondary | ICD-10-CM | POA: Diagnosis not present

## 2017-09-20 DIAGNOSIS — S022XXA Fracture of nasal bones, initial encounter for closed fracture: Secondary | ICD-10-CM | POA: Diagnosis not present

## 2017-09-20 DIAGNOSIS — E878 Other disorders of electrolyte and fluid balance, not elsewhere classified: Secondary | ICD-10-CM

## 2017-09-20 DIAGNOSIS — S064X0A Epidural hemorrhage without loss of consciousness, initial encounter: Secondary | ICD-10-CM | POA: Diagnosis not present

## 2017-09-20 DIAGNOSIS — I482 Chronic atrial fibrillation: Secondary | ICD-10-CM

## 2017-09-20 DIAGNOSIS — N179 Acute kidney failure, unspecified: Secondary | ICD-10-CM

## 2017-09-20 DIAGNOSIS — E871 Hypo-osmolality and hyponatremia: Secondary | ICD-10-CM | POA: Diagnosis not present

## 2017-09-20 DIAGNOSIS — J811 Chronic pulmonary edema: Secondary | ICD-10-CM | POA: Diagnosis not present

## 2017-09-20 DIAGNOSIS — R296 Repeated falls: Secondary | ICD-10-CM | POA: Diagnosis not present

## 2017-09-20 DIAGNOSIS — I4891 Unspecified atrial fibrillation: Secondary | ICD-10-CM | POA: Diagnosis not present

## 2017-09-20 DIAGNOSIS — S8254XA Nondisplaced fracture of medial malleolus of right tibia, initial encounter for closed fracture: Secondary | ICD-10-CM | POA: Diagnosis not present

## 2017-09-20 DIAGNOSIS — B373 Candidiasis of vulva and vagina: Secondary | ICD-10-CM

## 2017-09-20 DIAGNOSIS — K219 Gastro-esophageal reflux disease without esophagitis: Secondary | ICD-10-CM | POA: Diagnosis not present

## 2017-09-20 DIAGNOSIS — X58XXXD Exposure to other specified factors, subsequent encounter: Secondary | ICD-10-CM | POA: Diagnosis not present

## 2017-09-20 DIAGNOSIS — S0990XA Unspecified injury of head, initial encounter: Secondary | ICD-10-CM | POA: Diagnosis not present

## 2017-09-20 DIAGNOSIS — R05 Cough: Secondary | ICD-10-CM | POA: Diagnosis not present

## 2017-09-20 DIAGNOSIS — Z7401 Bed confinement status: Secondary | ICD-10-CM | POA: Diagnosis not present

## 2017-09-20 DIAGNOSIS — E869 Volume depletion, unspecified: Secondary | ICD-10-CM | POA: Diagnosis not present

## 2017-09-20 DIAGNOSIS — S82832A Other fracture of upper and lower end of left fibula, initial encounter for closed fracture: Secondary | ICD-10-CM | POA: Diagnosis not present

## 2017-09-20 DIAGNOSIS — Z79899 Other long term (current) drug therapy: Secondary | ICD-10-CM | POA: Diagnosis not present

## 2017-09-20 DIAGNOSIS — N183 Chronic kidney disease, stage 3 (moderate): Secondary | ICD-10-CM | POA: Diagnosis not present

## 2017-09-20 DIAGNOSIS — S82301D Unspecified fracture of lower end of right tibia, subsequent encounter for closed fracture with routine healing: Secondary | ICD-10-CM | POA: Diagnosis not present

## 2017-09-20 DIAGNOSIS — M6281 Muscle weakness (generalized): Secondary | ICD-10-CM | POA: Diagnosis not present

## 2017-09-20 DIAGNOSIS — F79 Unspecified intellectual disabilities: Secondary | ICD-10-CM | POA: Diagnosis not present

## 2017-09-20 DIAGNOSIS — S098XXA Other specified injuries of head, initial encounter: Secondary | ICD-10-CM | POA: Diagnosis not present

## 2017-09-20 DIAGNOSIS — F70 Mild intellectual disabilities: Secondary | ICD-10-CM | POA: Diagnosis not present

## 2017-09-20 DIAGNOSIS — S82302A Unspecified fracture of lower end of left tibia, initial encounter for closed fracture: Secondary | ICD-10-CM | POA: Diagnosis not present

## 2017-09-20 DIAGNOSIS — E876 Hypokalemia: Secondary | ICD-10-CM | POA: Diagnosis not present

## 2017-09-20 DIAGNOSIS — M109 Gout, unspecified: Secondary | ICD-10-CM | POA: Diagnosis not present

## 2017-09-20 DIAGNOSIS — S199XXA Unspecified injury of neck, initial encounter: Secondary | ICD-10-CM | POA: Diagnosis not present

## 2017-09-20 DIAGNOSIS — R2689 Other abnormalities of gait and mobility: Secondary | ICD-10-CM | POA: Diagnosis not present

## 2017-09-20 DIAGNOSIS — R279 Unspecified lack of coordination: Secondary | ICD-10-CM | POA: Diagnosis not present

## 2017-09-20 DIAGNOSIS — S82252A Displaced comminuted fracture of shaft of left tibia, initial encounter for closed fracture: Secondary | ICD-10-CM | POA: Diagnosis not present

## 2017-09-20 DIAGNOSIS — I129 Hypertensive chronic kidney disease with stage 1 through stage 4 chronic kidney disease, or unspecified chronic kidney disease: Secondary | ICD-10-CM | POA: Diagnosis not present

## 2017-09-20 DIAGNOSIS — S82301A Unspecified fracture of lower end of right tibia, initial encounter for closed fracture: Secondary | ICD-10-CM | POA: Diagnosis not present

## 2017-09-20 DIAGNOSIS — S022XXB Fracture of nasal bones, initial encounter for open fracture: Secondary | ICD-10-CM | POA: Diagnosis not present

## 2017-09-20 DIAGNOSIS — W06XXXA Fall from bed, initial encounter: Secondary | ICD-10-CM | POA: Diagnosis not present

## 2017-09-20 DIAGNOSIS — E860 Dehydration: Secondary | ICD-10-CM

## 2017-09-20 DIAGNOSIS — S022XXD Fracture of nasal bones, subsequent encounter for fracture with routine healing: Secondary | ICD-10-CM | POA: Diagnosis not present

## 2017-09-20 DIAGNOSIS — G4700 Insomnia, unspecified: Secondary | ICD-10-CM | POA: Diagnosis not present

## 2017-09-20 LAB — CBC
HEMATOCRIT: 29.7 % — AB (ref 36.0–46.0)
Hemoglobin: 9.5 g/dL — ABNORMAL LOW (ref 12.0–15.0)
MCH: 28.5 pg (ref 26.0–34.0)
MCHC: 32 g/dL (ref 30.0–36.0)
MCV: 89.2 fL (ref 78.0–100.0)
Platelets: 207 10*3/uL (ref 150–400)
RBC: 3.33 MIL/uL — ABNORMAL LOW (ref 3.87–5.11)
RDW: 19 % — AB (ref 11.5–15.5)
WBC: 5.2 10*3/uL (ref 4.0–10.5)

## 2017-09-20 LAB — PROTIME-INR
INR: 3.89
Prothrombin Time: 37.9 seconds — ABNORMAL HIGH (ref 11.4–15.2)

## 2017-09-20 LAB — PHOSPHORUS: Phosphorus: 2.5 mg/dL (ref 2.5–4.6)

## 2017-09-20 NOTE — Progress Notes (Signed)
ANTICOAGULATION CONSULT NOTE - follow up  Pharmacy Consult for  COUMADIN (home med) Indication: atrial fibrillation  No Known Allergies  Patient Measurements: Height: 5\' 7"  (170.2 cm) Weight: 203 lb 9.6 oz (92.4 kg) IBW/kg (Calculated) : 61.6  Vital Signs: Temp: 98.1 F (36.7 C) (11/18 2320) Temp Source: Oral (11/18 2320) BP: 130/66 (11/18 2320) Pulse Rate: 85 (11/18 2320)  Labs: Recent Labs    09/18/17 0656 09/19/17 0621 09/19/17 1025 09/20/17 0628  HGB  --   --   --  9.5*  HCT  --   --   --  29.7*  PLT  --   --   --  207  LABPROT 29.1* 35.6*  --  37.9*  INR 2.78 3.60  --  3.89  CREATININE 1.16*  --  1.08*  --    Estimated Creatinine Clearance: 52.5 mL/min (A) (by C-G formula based on SCr of 1.08 mg/dL (H)).  Assessment: INR > 9 on admission.  MD ordered Vitamin K on admission.  INR  responded nicely.  No bleeding noted, but Hgb is down but seems to be stable. Hemoccult was negative. INR is supratherapeutic again and slightly higher than yesterday.  Goal of Therapy:  INR 2-3 Monitor platelets by anticoagulation protocol: Yes   Plan:  No coumadin today Daily INR Monitor for s/sx bleeding  Isac Sarna, BS Vena Austria, BCPS Clinical Pharmacist Pager [phone] 09/20/2017,10:58 AM

## 2017-09-20 NOTE — Discharge Summary (Signed)
Physician Discharge Summary  [name] NOB:[id number] DOB: Jul 29, 1941 DOA: 09/11/2017  PCP: [name]  Admit date: 09/11/2017 Discharge date: 09/20/2017  Time spent: 35 minutes  Recommendations for Outpatient Follow-up:  1. xeroform gauze to blistered areas- change QID and wrap with conform gauze- would care to follow at SNF 2. CMET and INR in 1 week please 3. Daily weights- Lasix being held.  Follow patient volume status and resume lasix if needed.  Discharge Diagnoses:  Principal Problem:   Electrolyte depletion Active Problems:   MENTAL RETARDATION   GERD (gastroesophageal reflux disease)   Atrial fibrillation (HCC)   Chronic anticoagulation   Chronic kidney disease, stage 3 (HCC)   Hypokalemia   Hyponatremia   Acute renal injury (HCC)   Dehydration   Supratherapeutic INR   Hyperbilirubinemia   Elevated troponin   Vulvovaginitis due to yeast   Elevated alkaline phosphatase level   Acute renal failure superimposed on chronic kidney disease (HCC)   Elevated LFTs   Hypomagnesemia   Mental retardation   Discharge Condition: Stable and improved.  Patient will be discharged to a skilled nursing facility for further care and rehabilitation.  Diet recommendation: Heart healthy diet  Filed Weights   09/14/17 0500 09/19/17 0500 09/20/17 0500  Weight: 86.7 kg (191 lb 2.2 oz) 93 kg (205 lb) 92.4 kg (203 lb 9.6 oz)    History of present illness:  Laura J Gammonis a 76 y.o.femalewith a history of cognitive deficits, atrial fibrillation on chronic anticoagulation, GERD, glaucoma, hypertension,neuropathy. Due to patient'sintellectual disability/cognitive deficits, the history obtained by sister who takes care of the patient. Patient lives in a trailer in the sister's backyard and is fairly unsupervised. Over the past couple of weeks, patient has had severe urinary incontinence occurring every time she stood up and this is the reason she was brought to the ER. A  UA was collect by [facility] to r/o UTI but no results obtained yet. The patient was noted to come into the ER with wet shoes. Per sister, she puts patient's meds in a pill box and patient takes them on her own.  Labs in ER:  NA+ 120, K 1.4, Cl+ 62, CO2, 40, BUN 65, CR 2.80, TnI 0.06, Lactic acid 2.1, INR 8.10 UA mod, Hb, rare bacteria, TNTC sq epithelial cells and 0-5 WBC    Hospital Course:  Severe electrolyte abnormalities, dehydration and AKI, contraction alkalosis -d/c Lasix (repeat ECHO shows normal EF now)- given IVF In hospital -electrolytes(sodium, K, Mg and phos) replaced and stable at discharge -problems with electrolyte disturbances exacerbated by diarrhea while in the hospital which has resolved prior to discharge. -renal ultrasound: small kidneys with cortical thinning -check Bmet in 1 week at SNF  H/o Systolic CHF - ECHO in 9476 showed an EF of 45-50%- this has improved to 55-60% %- see report below -given AKI, dehydration and recent contraction alkalosis, will hold lasix at discharge -follow daily weight -continue low sodium diet   Supra therapeutic INR -total of 8 mg Vit K given- INR subsequently low and now normal  ? Black BM x 2per patient's family 11/14 -hemoccult neg- Hb stable  Elevated Alk phos and T bili - abd ultrasound done and gallbladder not visualized - suspected to either be contracted or obscured by gas- fatty liver suspected. -will recommend repeat CMET in 1 week to follow LFT's  A-fib- permanent  -cont Metoprolol at 150 mg daily -continue Coumadin, INR is therapeutic at discharge   Essential HTN -Lisinopril dose  decreased from 20 BID to 20 daily -cont Metoprolol  Urinary incontinence -possibly due to Lasix in setting of weakness at home -not very severe in the hospital- patient is able to tell when she needs to void but if there is a delay in taking her to the bathroom, she will void on herself.  Mental retardation -stable and back to  baseline -understand that he needs to go for rehabilitation before going home.  Moisture wounds and blisters on legs and feet -due to feet being soaked in urine -appreciate wound care eval-xeroform gauze to blistered areas- change QID and wrap with conform gauze  Elevated troponin -mild elevation and flat trend- no further work up needed -most likely from AKI  Vulvovaginitis due to yeast -treated with Diflucan  Gout -allopurinol   GERD -resume PPI   Procedures:   2 D ECHO Upper normal LV wall thickness with LVEF 55-60%. Indeterminate diastolic function. Moderate left atrial enlargement.Mildly calcified mitral annulus with mild mitral regurgitation. Mild calcified aortic annulus with mild aortic regurgitation. Mild tricuspid regurgitation with PASP estimated 36 mmHg. Mild right atrial enlargement. Prominent pericardial fat pad.   Consultations:  Renal service   Discharge Exam: Vitals:   09/19/17 2320 09/20/17 0645  BP: 130/66   Pulse: 85   Resp: 16   Temp: 98.1 F (36.7 C)   SpO2: 100% 100%    General: Pt is alert, awake, not in acute distress, able to follow commands and is oriented x2. Cardiovascular: RRR, S1/S2 +, no rubs, no gallops Respiratory: CTA bilaterally, no wheezing, no rhonchi Abdominal: Soft, NT, ND, bowel sounds + Extremities: no edema, no cyanosis- good pedal pulses Skin: erythema, abrasions, blisters and scab on lower legs noted Neuro: oriented to person and place- normal strength in extermities   Discharge Instructions    Current Discharge Medication List    START taking these medications   Details  alum & mag hydroxide-simeth (MAALOX/MYLANTA) 200-200-20 MG/5ML suspension Take 30 mLs every 4 (four) hours as needed by mouth for indigestion. Qty: 355 mL, Refills: 0    traZODone (DESYREL) 100 MG tablet Take 1 tablet (100 mg total) at bedtime as needed by mouth for sleep.      CONTINUE these medications which have  CHANGED   Details  lisinopril (PRINIVIL,ZESTRIL) 20 MG tablet Take 1 tablet (20 mg total) daily by mouth. Qty: 60 tablet, Refills: 6    metoprolol succinate (TOPROL-XL) 50 MG 24 hr tablet Take 3 tablets (150 mg total) daily by mouth. Take with or immediately following a meal.    traMADol (ULTRAM) 50 MG tablet Take 1 tablet (50 mg total) every 6 (six) hours as needed by mouth for moderate pain. For pain Qty: 90 tablet, Refills: 0      CONTINUE these medications which have NOT CHANGED   Details  allopurinol (ZYLOPRIM) 100 MG tablet Take 100 mg every evening by mouth.     Brinzolamide-Brimonidine (SIMBRINZA) 1-0.2 % SUSP Place 1 drop into both eyes 2 (two) times daily.     Cholecalciferol (VITAMIN D3) 5000 units CAPS Take 1 capsule by mouth daily.     esomeprazole (NEXIUM) 40 MG capsule TAKE ONE CAPSULE BY MOUTH TWO TIMES DAILY Qty: 60 capsule, Refills: 5    gabapentin (NEURONTIN) 300 MG capsule Take 300 mg at bedtime by mouth.    latanoprost (XALATAN) 0.005 % ophthalmic solution Place 1 drop into both eyes at bedtime.      loratadine (CLARITIN) 10 MG tablet Take 10 mg by mouth as  needed for allergies.    neomycin-polymyxin-hydrocortisone (CORTISPORIN) 3.5-10000-1 otic suspension Place 2 drops 4 (four) times daily as needed into both ears.     warfarin (COUMADIN) 2 MG tablet Take 2 mg daily by mouth.       STOP taking these medications     furosemide (LASIX) 40 MG tablet      meclizine (ANTIVERT) 25 MG tablet        No Known Allergies Contact information for after-discharge care    [facility] .   Service:  Skilled Nursing Contact information: [address] [place] [phone]              The results of significant diagnostics from this hospitalization (including imaging, microbiology, ancillary and laboratory) are listed below for reference.    Significant Diagnostic Studies: US Renal  Result  Date: 2017/09/16 CLINICAL DATA:  Chronic renal failure, stage III, with acute exacerbation EXAM: RENAL / URINARY TRACT ULTRASOUND COMPLETE COMPARISON:  None. FINDINGS: Right Kidney: Length: 8.4 cm. Echogenicity within normal limits. There is renal cortical thinning. No mass, perinephric fluid, or hydronephrosis visualized. No sonographically demonstrable calculus or ureterectasis. Left Kidney: Length: 8.9 cm. Echogenicity within normal limits there is renal cortical thinning. No mass, perinephric fluid, or hydronephrosis visualized. No sonographically demonstrable calculus or ureterectasis. Bladder: Appears normal for degree of bladder distention. IMPRESSION: Kidneys bilaterally are small with renal cortical thinning. These are findings that may be seen with medical renal disease. The renal echogenicity is within normal limits bilaterally. No obstructing foci identified in either kidney. Electronically Signed   By: Lowella Grip III M.D.   On: 2017/09/16 13:55   Dg Chest Port 1 View  Result Date: September 16, 2017 CLINICAL DATA:  Central catheter placement EXAM: PORTABLE CHEST 1 VIEW COMPARISON:  September 11, 2017 FINDINGS: Peripherally inserted central catheter tip is at the cavoatrial junction. No pneumothorax. There is airspace consolidation in the left lower lobe with small left pleural effusion. There is underlying cardiomegaly with pulmonary venous hypertension. There is mild interstitial edema. No evident bone lesions. No adenopathy. IMPRESSION: Central catheter tip at cavoatrial junction.  No pneumothorax. Cardiomegaly with pulmonary venous hypertension consistent with pulmonary vascular congestion. Mild interstitial edema with small left pleural effusion. Left lower lobe airspace consolidation, felt to be most likely due to pneumonia. Followup PA and lateral chest radiographs recommended in 3-4 weeks following trial of antibiotic therapy to ensure resolution and exclude underlying malignancy.  Electronically Signed   By: Lowella Grip III M.D.   On: 09-16-2017 13:57   Dg Chest Port 1 View  Result Date: 09/11/2017 CLINICAL DATA:  76 y/o  F; weakness, dry heaves, recent falls. EXAM: PORTABLE CHEST 1 VIEW COMPARISON:  12/26/2015 chest radiograph FINDINGS: Stable cardiomegaly and large hiatal hernia. Diffuse haziness of lungs, reticular opacities, and blunted costal diaphragmatic angles. No acute osseous abnormality identified. IMPRESSION: Mild pulmonary edema and small bilateral pleural effusions. Stable cardiomegaly and large hiatal hernia Electronically Signed   By: Kristine Garbe M.D.   On: 09/11/2017 19:39   US Abdomen Limited Ruq  Result Date: 09/17/2017 CLINICAL DATA:  Elevated liver enzymes EXAM: ULTRASOUND ABDOMEN LIMITED RIGHT UPPER QUADRANT COMPARISON:  None. FINDINGS: Gallbladder: Not visualized.  Question absence. Common bile duct: Diameter: 5 mm. No intrahepatic or extrahepatic biliary duct dilatation. Liver: No focal lesion identified. The liver echogenicity increased diffusely. Portal vein is patent on color Doppler imaging with normal direction of blood flow towards the liver.  IMPRESSION: 1. Gallbladder not seen. Question absence versus severely contracted gallbladder. Note that there is gas overlying the gallbladder fossa. 2. Diffuse increase in liver echotexture, a finding most likely indicative of hepatic steatosis. While no focal liver lesions are evident, it must be cautioned that the sensitivity of ultrasound for detection of focal liver lesions is diminished in this circumstance. Electronically Signed   By: Lowella Grip III M.D.   On: 09/17/2017 12:06    Microbiology: Recent Results (from the past 240 hour(s))  Urine culture     Status: None   Collection Time: 09/11/17  7:30 PM  Result Value Ref Range Status   Specimen Description URINE, CATHETERIZED  Final   Special Requests NONE  Final   Culture   Final    NO GROWTH Performed at [facility], [address] [place], [place] [address]    Report Status 09/13/2017 FINAL  Final  MRSA PCR Screening     Status: None   Collection Time: 09/12/17  1:30 AM  Result Value Ref Range Status   MRSA by PCR NEGATIVE NEGATIVE Final    Comment:        The GeneXpert MRSA Assay (FDA approved for NASAL specimens only), is one component of a comprehensive MRSA colonization surveillance program. It is not intended to diagnose MRSA infection nor to guide or monitor treatment for MRSA infections.      Labs: Basic Metabolic Panel: Recent Labs  Lab 09/15/17 0602  09/16/17 0545 09/16/17 1610 09/17/17 0450 09/18/17 0656 09/19/17 0621 09/19/17 1025 09/20/17 0628  NA 127*   < > 126* 128* 127*  128* 131*  --  133*  --   K 3.7   < > 2.7* 3.3* 2.8*  2.8* 4.6  --  4.4  --   CL 87*   < > 85* 90* 89*  90* 99*  --  104  --   CO2 31   < > 31 27 29  29 26   --  24  --   GLUCOSE 89   < > 111* 101* 100*  101* 97  --  101*  --   BUN 22*   < > 16 14 10  11 7   --  6  --   CREATININE 1.38*   < > 1.34* 1.31* 1.19*  1.19* 1.16*  --  1.08*  --   CALCIUM 8.9   < > 8.2* 8.2* 7.9*  8.0* 8.1*  --  8.2*  --   MG 1.7  --  1.9  --  1.6* 2.1  --  1.7  --   PHOS 1.5*  --  2.8  --  3.4  3.4 2.1*  2.1* 2.3* 2.4* 2.5   < > = values in this interval not displayed.   Liver Function Tests: Recent Labs  Lab 09/14/17 0410 09/15/17 0602 09/16/17 0545 09/17/17 0450 09/18/17 0656  AST 29 30 29 23   --   ALT 17 19 17 17   --   ALKPHOS 128* 158* 153* 144*  --   BILITOT 1.0 1.4* 1.5* 1.1  --   PROT 5.6* 5.9* 5.6* 5.2*  --   ALBUMIN 2.6* 2.8* 2.5* 2.4*  2.4* 2.4*   CBC: Recent Labs  Lab 09/14/17 0410 09/15/17 0602 09/16/17 0545 09/17/17 0450 09/20/17 0628  WBC 7.6 7.4 6.0 5.3 5.2  HGB 10.1* 10.5* 10.2* 9.2* 9.5*  HCT 30.9* 31.7* 30.4* 27.1* 29.7*  MCV 87.8 87.6 86.4 86.9 89.2  PLT 213 208  185 166 207    Signed:  Barton Dubois Orr.  [facility] 09/20/2017, 12:29 PM

## 2017-09-20 NOTE — Clinical Social Work Placement (Signed)
   CLINICAL SOCIAL WORK PLACEMENT  NOTE  Date:  09/20/2017  Patient Details  Name: [name] MRN: [id number] Date of Birth: 1941/01/13  Clinical Social Work is seeking post-discharge placement for this patient at the [place] level of care (*CSW will initial, date and re-position this form in  chart as items are completed):  Yes   Patient/family provided with [facility] Work Department's list of facilities offering this level of care within the geographic area requested by the patient (or if unable, by the patient's family).  Yes   Patient/family informed of their freedom to choose among providers that offer the needed level of care, that participate in Medicare, Medicaid or managed care program needed by the patient, have an available bed and are willing to accept the patient.  Yes   Patient/family informed of [facility]'s ownership interest in [facility] and [facility], as well as of the fact that they are under no obligation to receive care at these facilities.  PASRR submitted to EDS on 09/14/17     PASRR number received on 09/16/17     Existing PASRR number confirmed on       FL2 transmitted to all facilities in geographic area requested by pt/family on 09/14/17     FL2 transmitted to all facilities within larger geographic area on       Patient informed that his/her managed care company has contracts with or will negotiate with certain facilities, including the following:        Yes   Patient/family informed of bed offers received.  Patient chooses bed at [facility]     Physician recommends and patient chooses bed at      Patient to be transferred to [facility] on  .  Patient to be transferred to facility by non emergency ambulance     Patient family notified on 09/20/17 of transfer.  Name of family member notified:  Eric Form     PHYSICIAN       Additional Comment:     _______________________________________________ Shade Flood, LCSW 09/20/2017, 12:34 PM
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
## 2017-09-20 NOTE — Care Management Important Message (Signed)
Important Message  Patient Details  Name: KEITA DEMARCO MRN: [id number] Date of Birth: 12-31-1940   Medicare Important Message Given:  Yes    Avaeh Ewer, Chauncey Reading, RN 09/20/2017, 8:25 AM

## 2017-09-21 NOTE — Progress Notes (Signed)
Discharged to [facility] in stable condition via EMS.

## 2017-09-23 DIAGNOSIS — F0390 Unspecified dementia without behavioral disturbance: Secondary | ICD-10-CM | POA: Diagnosis not present

## 2017-09-23 DIAGNOSIS — W06XXXA Fall from bed, initial encounter: Secondary | ICD-10-CM | POA: Diagnosis not present

## 2017-09-23 DIAGNOSIS — S199XXA Unspecified injury of neck, initial encounter: Secondary | ICD-10-CM | POA: Diagnosis not present

## 2017-09-23 DIAGNOSIS — Z7901 Long term (current) use of anticoagulants: Secondary | ICD-10-CM | POA: Diagnosis not present

## 2017-09-23 DIAGNOSIS — S022XXA Fracture of nasal bones, initial encounter for closed fracture: Secondary | ICD-10-CM | POA: Diagnosis not present

## 2017-09-23 DIAGNOSIS — S0990XA Unspecified injury of head, initial encounter: Secondary | ICD-10-CM | POA: Diagnosis not present

## 2017-09-23 DIAGNOSIS — Z79899 Other long term (current) drug therapy: Secondary | ICD-10-CM | POA: Diagnosis not present

## 2017-09-23 DIAGNOSIS — S022XXB Fracture of nasal bones, initial encounter for open fracture: Secondary | ICD-10-CM | POA: Diagnosis not present

## 2017-10-14 DIAGNOSIS — S82302A Unspecified fracture of lower end of left tibia, initial encounter for closed fracture: Secondary | ICD-10-CM | POA: Diagnosis not present

## 2017-10-14 DIAGNOSIS — S82252A Displaced comminuted fracture of shaft of left tibia, initial encounter for closed fracture: Secondary | ICD-10-CM | POA: Diagnosis not present

## 2017-10-14 DIAGNOSIS — S82301A Unspecified fracture of lower end of right tibia, initial encounter for closed fracture: Secondary | ICD-10-CM | POA: Diagnosis not present

## 2017-10-14 DIAGNOSIS — S82832A Other fracture of upper and lower end of left fibula, initial encounter for closed fracture: Secondary | ICD-10-CM | POA: Diagnosis not present

## 2017-11-13 DIAGNOSIS — I482 Chronic atrial fibrillation: Secondary | ICD-10-CM | POA: Diagnosis not present

## 2017-11-13 DIAGNOSIS — K219 Gastro-esophageal reflux disease without esophagitis: Secondary | ICD-10-CM | POA: Diagnosis not present

## 2017-11-13 DIAGNOSIS — F70 Mild intellectual disabilities: Secondary | ICD-10-CM | POA: Diagnosis not present

## 2017-11-13 DIAGNOSIS — N183 Chronic kidney disease, stage 3 (moderate): Secondary | ICD-10-CM | POA: Diagnosis not present

## 2017-11-16 DIAGNOSIS — S82301D Unspecified fracture of lower end of right tibia, subsequent encounter for closed fracture with routine healing: Secondary | ICD-10-CM | POA: Diagnosis not present

## 2017-11-16 DIAGNOSIS — S8254XA Nondisplaced fracture of medial malleolus of right tibia, initial encounter for closed fracture: Secondary | ICD-10-CM | POA: Diagnosis not present

## 2017-12-03 DIAGNOSIS — Z7901 Long term (current) use of anticoagulants: Secondary | ICD-10-CM | POA: Diagnosis not present

## 2017-12-03 DIAGNOSIS — Z5181 Encounter for therapeutic drug level monitoring: Secondary | ICD-10-CM | POA: Diagnosis not present

## 2017-12-08 DIAGNOSIS — Z5181 Encounter for therapeutic drug level monitoring: Secondary | ICD-10-CM | POA: Diagnosis not present

## 2017-12-08 DIAGNOSIS — Z7901 Long term (current) use of anticoagulants: Secondary | ICD-10-CM | POA: Diagnosis not present

## 2017-12-11 DIAGNOSIS — E876 Hypokalemia: Secondary | ICD-10-CM | POA: Diagnosis not present

## 2017-12-12 DIAGNOSIS — R350 Frequency of micturition: Secondary | ICD-10-CM | POA: Diagnosis not present

## 2017-12-12 DIAGNOSIS — R8290 Unspecified abnormal findings in urine: Secondary | ICD-10-CM | POA: Diagnosis not present

## 2017-12-12 DIAGNOSIS — R4182 Altered mental status, unspecified: Secondary | ICD-10-CM | POA: Diagnosis not present

## 2017-12-13 DIAGNOSIS — R638 Other symptoms and signs concerning food and fluid intake: Secondary | ICD-10-CM | POA: Diagnosis not present

## 2017-12-13 DIAGNOSIS — R5383 Other fatigue: Secondary | ICD-10-CM | POA: Diagnosis not present

## 2017-12-15 DIAGNOSIS — Z96643 Presence of artificial hip joint, bilateral: Secondary | ICD-10-CM | POA: Diagnosis not present

## 2017-12-15 DIAGNOSIS — J449 Chronic obstructive pulmonary disease, unspecified: Secondary | ICD-10-CM | POA: Diagnosis not present

## 2017-12-15 DIAGNOSIS — Z7901 Long term (current) use of anticoagulants: Secondary | ICD-10-CM | POA: Diagnosis not present

## 2017-12-15 DIAGNOSIS — Z5181 Encounter for therapeutic drug level monitoring: Secondary | ICD-10-CM | POA: Diagnosis not present

## 2017-12-15 DIAGNOSIS — I4891 Unspecified atrial fibrillation: Secondary | ICD-10-CM | POA: Diagnosis not present

## 2017-12-15 DIAGNOSIS — E878 Other disorders of electrolyte and fluid balance, not elsewhere classified: Secondary | ICD-10-CM | POA: Diagnosis not present

## 2017-12-15 DIAGNOSIS — K219 Gastro-esophageal reflux disease without esophagitis: Secondary | ICD-10-CM | POA: Diagnosis not present

## 2017-12-15 DIAGNOSIS — I129 Hypertensive chronic kidney disease with stage 1 through stage 4 chronic kidney disease, or unspecified chronic kidney disease: Secondary | ICD-10-CM | POA: Diagnosis not present

## 2017-12-15 DIAGNOSIS — N183 Chronic kidney disease, stage 3 (moderate): Secondary | ICD-10-CM | POA: Diagnosis not present

## 2017-12-15 DIAGNOSIS — R2689 Other abnormalities of gait and mobility: Secondary | ICD-10-CM | POA: Diagnosis not present

## 2017-12-15 DIAGNOSIS — G629 Polyneuropathy, unspecified: Secondary | ICD-10-CM | POA: Diagnosis not present

## 2017-12-15 DIAGNOSIS — H409 Unspecified glaucoma: Secondary | ICD-10-CM | POA: Diagnosis not present

## 2017-12-15 DIAGNOSIS — Z9181 History of falling: Secondary | ICD-10-CM | POA: Diagnosis not present

## 2017-12-15 DIAGNOSIS — F79 Unspecified intellectual disabilities: Secondary | ICD-10-CM | POA: Diagnosis not present

## 2017-12-16 ENCOUNTER — Other Ambulatory Visit: Payer: Self-pay | Admitting: Cardiovascular Disease

## 2017-12-16 DIAGNOSIS — N183 Chronic kidney disease, stage 3 (moderate): Secondary | ICD-10-CM | POA: Diagnosis not present

## 2017-12-16 DIAGNOSIS — R2689 Other abnormalities of gait and mobility: Secondary | ICD-10-CM | POA: Diagnosis not present

## 2017-12-16 DIAGNOSIS — J449 Chronic obstructive pulmonary disease, unspecified: Secondary | ICD-10-CM | POA: Diagnosis not present

## 2017-12-16 DIAGNOSIS — E878 Other disorders of electrolyte and fluid balance, not elsewhere classified: Secondary | ICD-10-CM | POA: Diagnosis not present

## 2017-12-16 DIAGNOSIS — I129 Hypertensive chronic kidney disease with stage 1 through stage 4 chronic kidney disease, or unspecified chronic kidney disease: Secondary | ICD-10-CM | POA: Diagnosis not present

## 2017-12-16 DIAGNOSIS — I4891 Unspecified atrial fibrillation: Secondary | ICD-10-CM | POA: Diagnosis not present

## 2017-12-17 ENCOUNTER — Telehealth: Payer: Self-pay | Admitting: Cardiovascular Disease

## 2017-12-17 NOTE — Telephone Encounter (Signed)
Please give pt a call concerning her metoprolol succinate (TOPROL-XL) 50 MG 24 hr tablet [[id number]]

## 2017-12-17 NOTE — Telephone Encounter (Signed)
Returned pt call. No answer. Left message pt to return call.

## 2017-12-20 ENCOUNTER — Other Ambulatory Visit: Payer: Self-pay

## 2017-12-20 DIAGNOSIS — S82831D Other fracture of upper and lower end of right fibula, subsequent encounter for closed fracture with routine healing: Secondary | ICD-10-CM | POA: Diagnosis not present

## 2017-12-20 DIAGNOSIS — S82201A Unspecified fracture of shaft of right tibia, initial encounter for closed fracture: Secondary | ICD-10-CM | POA: Diagnosis not present

## 2017-12-20 DIAGNOSIS — S82301D Unspecified fracture of lower end of right tibia, subsequent encounter for closed fracture with routine healing: Secondary | ICD-10-CM | POA: Diagnosis not present

## 2017-12-20 MED ORDER — LISINOPRIL 5 MG PO TABS: 5.0000 mg | ORAL_TABLET | Freq: Every day | ORAL | 3 refills | Status: DC

## 2017-12-20 MED ORDER — METOPROLOL TARTRATE 25 MG PO TABS: 12.5000 mg | ORAL_TABLET | Freq: Two times a day (BID) | ORAL | 3 refills | Status: DC

## 2017-12-20 NOTE — Telephone Encounter (Signed)
Returned pt's daughters call. She stated that the pt's medication was changed by Dr. Sherrie Sport during a a recent hospital stay. He did not give her enough medication to make it to her next appt. With Dr. Lynnell Jude. I sent refills in for pt. I will request records from recent hospital visit.

## 2017-12-20 NOTE — Telephone Encounter (Signed)
Please call Eric Form @ [phone] concerning the Metoprolol

## 2017-12-21 DIAGNOSIS — N183 Chronic kidney disease, stage 3 (moderate): Secondary | ICD-10-CM | POA: Diagnosis not present

## 2017-12-21 DIAGNOSIS — E878 Other disorders of electrolyte and fluid balance, not elsewhere classified: Secondary | ICD-10-CM | POA: Diagnosis not present

## 2017-12-21 DIAGNOSIS — J449 Chronic obstructive pulmonary disease, unspecified: Secondary | ICD-10-CM | POA: Diagnosis not present

## 2017-12-21 DIAGNOSIS — I129 Hypertensive chronic kidney disease with stage 1 through stage 4 chronic kidney disease, or unspecified chronic kidney disease: Secondary | ICD-10-CM | POA: Diagnosis not present

## 2017-12-21 DIAGNOSIS — I4891 Unspecified atrial fibrillation: Secondary | ICD-10-CM | POA: Diagnosis not present

## 2017-12-21 DIAGNOSIS — R2689 Other abnormalities of gait and mobility: Secondary | ICD-10-CM | POA: Diagnosis not present

## 2017-12-23 DIAGNOSIS — J449 Chronic obstructive pulmonary disease, unspecified: Secondary | ICD-10-CM | POA: Diagnosis not present

## 2017-12-23 DIAGNOSIS — N183 Chronic kidney disease, stage 3 (moderate): Secondary | ICD-10-CM | POA: Diagnosis not present

## 2017-12-23 DIAGNOSIS — R2689 Other abnormalities of gait and mobility: Secondary | ICD-10-CM | POA: Diagnosis not present

## 2017-12-23 DIAGNOSIS — I129 Hypertensive chronic kidney disease with stage 1 through stage 4 chronic kidney disease, or unspecified chronic kidney disease: Secondary | ICD-10-CM | POA: Diagnosis not present

## 2017-12-23 DIAGNOSIS — E878 Other disorders of electrolyte and fluid balance, not elsewhere classified: Secondary | ICD-10-CM | POA: Diagnosis not present

## 2017-12-23 DIAGNOSIS — I4891 Unspecified atrial fibrillation: Secondary | ICD-10-CM | POA: Diagnosis not present

## 2017-12-24 DIAGNOSIS — E878 Other disorders of electrolyte and fluid balance, not elsewhere classified: Secondary | ICD-10-CM | POA: Diagnosis not present

## 2017-12-24 DIAGNOSIS — I129 Hypertensive chronic kidney disease with stage 1 through stage 4 chronic kidney disease, or unspecified chronic kidney disease: Secondary | ICD-10-CM | POA: Diagnosis not present

## 2017-12-24 DIAGNOSIS — I4891 Unspecified atrial fibrillation: Secondary | ICD-10-CM | POA: Diagnosis not present

## 2017-12-24 DIAGNOSIS — N183 Chronic kidney disease, stage 3 (moderate): Secondary | ICD-10-CM | POA: Diagnosis not present

## 2017-12-24 DIAGNOSIS — J449 Chronic obstructive pulmonary disease, unspecified: Secondary | ICD-10-CM | POA: Diagnosis not present

## 2017-12-24 DIAGNOSIS — R2689 Other abnormalities of gait and mobility: Secondary | ICD-10-CM | POA: Diagnosis not present

## 2017-12-27 DIAGNOSIS — N183 Chronic kidney disease, stage 3 (moderate): Secondary | ICD-10-CM | POA: Diagnosis not present

## 2017-12-27 DIAGNOSIS — J449 Chronic obstructive pulmonary disease, unspecified: Secondary | ICD-10-CM | POA: Diagnosis not present

## 2017-12-27 DIAGNOSIS — R2689 Other abnormalities of gait and mobility: Secondary | ICD-10-CM | POA: Diagnosis not present

## 2017-12-27 DIAGNOSIS — E878 Other disorders of electrolyte and fluid balance, not elsewhere classified: Secondary | ICD-10-CM | POA: Diagnosis not present

## 2017-12-27 DIAGNOSIS — I4891 Unspecified atrial fibrillation: Secondary | ICD-10-CM | POA: Diagnosis not present

## 2017-12-27 DIAGNOSIS — I129 Hypertensive chronic kidney disease with stage 1 through stage 4 chronic kidney disease, or unspecified chronic kidney disease: Secondary | ICD-10-CM | POA: Diagnosis not present

## 2017-12-28 DIAGNOSIS — I4891 Unspecified atrial fibrillation: Secondary | ICD-10-CM | POA: Diagnosis not present

## 2017-12-28 DIAGNOSIS — R2689 Other abnormalities of gait and mobility: Secondary | ICD-10-CM | POA: Diagnosis not present

## 2017-12-28 DIAGNOSIS — J449 Chronic obstructive pulmonary disease, unspecified: Secondary | ICD-10-CM | POA: Diagnosis not present

## 2017-12-28 DIAGNOSIS — E878 Other disorders of electrolyte and fluid balance, not elsewhere classified: Secondary | ICD-10-CM | POA: Diagnosis not present

## 2017-12-28 DIAGNOSIS — I129 Hypertensive chronic kidney disease with stage 1 through stage 4 chronic kidney disease, or unspecified chronic kidney disease: Secondary | ICD-10-CM | POA: Diagnosis not present

## 2017-12-28 DIAGNOSIS — N183 Chronic kidney disease, stage 3 (moderate): Secondary | ICD-10-CM | POA: Diagnosis not present

## 2017-12-30 DIAGNOSIS — E878 Other disorders of electrolyte and fluid balance, not elsewhere classified: Secondary | ICD-10-CM | POA: Diagnosis not present

## 2017-12-30 DIAGNOSIS — N183 Chronic kidney disease, stage 3 (moderate): Secondary | ICD-10-CM | POA: Diagnosis not present

## 2017-12-30 DIAGNOSIS — I129 Hypertensive chronic kidney disease with stage 1 through stage 4 chronic kidney disease, or unspecified chronic kidney disease: Secondary | ICD-10-CM | POA: Diagnosis not present

## 2017-12-30 DIAGNOSIS — J449 Chronic obstructive pulmonary disease, unspecified: Secondary | ICD-10-CM | POA: Diagnosis not present

## 2017-12-30 DIAGNOSIS — I4891 Unspecified atrial fibrillation: Secondary | ICD-10-CM | POA: Diagnosis not present

## 2017-12-30 DIAGNOSIS — R2689 Other abnormalities of gait and mobility: Secondary | ICD-10-CM | POA: Diagnosis not present

## 2017-12-31 DIAGNOSIS — I4891 Unspecified atrial fibrillation: Secondary | ICD-10-CM | POA: Diagnosis not present

## 2017-12-31 DIAGNOSIS — I129 Hypertensive chronic kidney disease with stage 1 through stage 4 chronic kidney disease, or unspecified chronic kidney disease: Secondary | ICD-10-CM | POA: Diagnosis not present

## 2017-12-31 DIAGNOSIS — N183 Chronic kidney disease, stage 3 (moderate): Secondary | ICD-10-CM | POA: Diagnosis not present

## 2017-12-31 DIAGNOSIS — R2689 Other abnormalities of gait and mobility: Secondary | ICD-10-CM | POA: Diagnosis not present

## 2017-12-31 DIAGNOSIS — J449 Chronic obstructive pulmonary disease, unspecified: Secondary | ICD-10-CM | POA: Diagnosis not present

## 2017-12-31 DIAGNOSIS — E878 Other disorders of electrolyte and fluid balance, not elsewhere classified: Secondary | ICD-10-CM | POA: Diagnosis not present

## 2018-01-03 DIAGNOSIS — I4891 Unspecified atrial fibrillation: Secondary | ICD-10-CM | POA: Diagnosis not present

## 2018-01-03 DIAGNOSIS — E878 Other disorders of electrolyte and fluid balance, not elsewhere classified: Secondary | ICD-10-CM | POA: Diagnosis not present

## 2018-01-03 DIAGNOSIS — I129 Hypertensive chronic kidney disease with stage 1 through stage 4 chronic kidney disease, or unspecified chronic kidney disease: Secondary | ICD-10-CM | POA: Diagnosis not present

## 2018-01-03 DIAGNOSIS — R2689 Other abnormalities of gait and mobility: Secondary | ICD-10-CM | POA: Diagnosis not present

## 2018-01-03 DIAGNOSIS — J449 Chronic obstructive pulmonary disease, unspecified: Secondary | ICD-10-CM | POA: Diagnosis not present

## 2018-01-03 DIAGNOSIS — N183 Chronic kidney disease, stage 3 (moderate): Secondary | ICD-10-CM | POA: Diagnosis not present

## 2018-01-04 DIAGNOSIS — N183 Chronic kidney disease, stage 3 (moderate): Secondary | ICD-10-CM | POA: Diagnosis not present

## 2018-01-04 DIAGNOSIS — I4891 Unspecified atrial fibrillation: Secondary | ICD-10-CM | POA: Diagnosis not present

## 2018-01-04 DIAGNOSIS — R2689 Other abnormalities of gait and mobility: Secondary | ICD-10-CM | POA: Diagnosis not present

## 2018-01-04 DIAGNOSIS — E878 Other disorders of electrolyte and fluid balance, not elsewhere classified: Secondary | ICD-10-CM | POA: Diagnosis not present

## 2018-01-04 DIAGNOSIS — I129 Hypertensive chronic kidney disease with stage 1 through stage 4 chronic kidney disease, or unspecified chronic kidney disease: Secondary | ICD-10-CM | POA: Diagnosis not present

## 2018-01-04 DIAGNOSIS — J449 Chronic obstructive pulmonary disease, unspecified: Secondary | ICD-10-CM | POA: Diagnosis not present

## 2018-01-06 DIAGNOSIS — J449 Chronic obstructive pulmonary disease, unspecified: Secondary | ICD-10-CM | POA: Diagnosis not present

## 2018-01-06 DIAGNOSIS — E878 Other disorders of electrolyte and fluid balance, not elsewhere classified: Secondary | ICD-10-CM | POA: Diagnosis not present

## 2018-01-06 DIAGNOSIS — R2689 Other abnormalities of gait and mobility: Secondary | ICD-10-CM | POA: Diagnosis not present

## 2018-01-06 DIAGNOSIS — N183 Chronic kidney disease, stage 3 (moderate): Secondary | ICD-10-CM | POA: Diagnosis not present

## 2018-01-06 DIAGNOSIS — I4891 Unspecified atrial fibrillation: Secondary | ICD-10-CM | POA: Diagnosis not present

## 2018-01-06 DIAGNOSIS — I129 Hypertensive chronic kidney disease with stage 1 through stage 4 chronic kidney disease, or unspecified chronic kidney disease: Secondary | ICD-10-CM | POA: Diagnosis not present

## 2018-01-10 DIAGNOSIS — N183 Chronic kidney disease, stage 3 (moderate): Secondary | ICD-10-CM | POA: Diagnosis not present

## 2018-01-10 DIAGNOSIS — E878 Other disorders of electrolyte and fluid balance, not elsewhere classified: Secondary | ICD-10-CM | POA: Diagnosis not present

## 2018-01-10 DIAGNOSIS — J449 Chronic obstructive pulmonary disease, unspecified: Secondary | ICD-10-CM | POA: Diagnosis not present

## 2018-01-10 DIAGNOSIS — I4891 Unspecified atrial fibrillation: Secondary | ICD-10-CM | POA: Diagnosis not present

## 2018-01-10 DIAGNOSIS — I129 Hypertensive chronic kidney disease with stage 1 through stage 4 chronic kidney disease, or unspecified chronic kidney disease: Secondary | ICD-10-CM | POA: Diagnosis not present

## 2018-01-10 DIAGNOSIS — R2689 Other abnormalities of gait and mobility: Secondary | ICD-10-CM | POA: Diagnosis not present

## 2018-01-11 ENCOUNTER — Encounter: Payer: Self-pay | Admitting: Cardiovascular Disease

## 2018-01-11 ENCOUNTER — Other Ambulatory Visit: Payer: Self-pay

## 2018-01-11 ENCOUNTER — Ambulatory Visit (INDEPENDENT_AMBULATORY_CARE_PROVIDER_SITE_OTHER): Payer: Medicare Other | Admitting: Cardiovascular Disease

## 2018-01-11 VITALS — BP 89/50 | HR 68 | Ht 65.0 in | Wt 207.0 lb

## 2018-01-11 DIAGNOSIS — I959 Hypotension, unspecified: Secondary | ICD-10-CM | POA: Diagnosis not present

## 2018-01-11 DIAGNOSIS — I482 Chronic atrial fibrillation, unspecified: Secondary | ICD-10-CM

## 2018-01-11 DIAGNOSIS — I5032 Chronic diastolic (congestive) heart failure: Secondary | ICD-10-CM

## 2018-01-11 DIAGNOSIS — R42 Dizziness and giddiness: Secondary | ICD-10-CM

## 2018-01-11 NOTE — Patient Instructions (Signed)
Your physician wants you to follow-up in: [place] will receive a reminder letter in the mail two months in advance. If you don't receive a letter, please call our office to schedule the follow-up appointment.  Your physician has recommended you make the following change in your medication:   STOP LISINOPRIL  HOLD LASIX AND POTASSIUM   Thank you for choosing [facility]!!

## 2018-01-11 NOTE — Progress Notes (Signed)
SUBJECTIVE: The patient presents for follow-up of chronic diastolic heart failure, atrial fibrillation and flutter, and hypertension.   Most recent echocardiogram performed on 09/12/17 demonstrated normal left ventricular systolic function, LVEF 54-00%, indeterminate diastolic function, moderate left atrial enlargement, mild mitral, tricuspid, and aortic regurgitation.  She has a lifelong history of mental retardation, but is responsive to questions and contributes some information. Bulk of the history is provided by her niece [name]). Mrs. Ola lives in a trailer in her sister's backyard.  Hospitalized for syncope in February 2017. Cardiac monitoring 01/2016 showed atrial flutter, avg HR 67 bpm.  She was hospitalized for severe electrolyte abnormalities with dehydration and acute kidney injury with a contraction alkalosis in November 2018.  She went to rehabilitation in November at [facility].  She fell and broke her right leg in her nose.  Her right leg is now in a boot.  She has not seen her PCP since then.  She does have some lightheadedness and dizziness followed by "hot flashes "and nausea as per [name].  She has been having symptoms related to gastroesophageal reflux disease.  She denies syncope.  She also denies chest pain.  She is being treated for an upper respiratory tract infection as well.     Review of Systems: As per "subjective", otherwise negative.  No Known Allergies  Current Outpatient Medications  Medication Sig Dispense Refill  . allopurinol (ZYLOPRIM) 100 MG tablet Take 100 mg every evening by mouth.     Marland Kitchen alum & mag hydroxide-simeth (MAALOX/MYLANTA) 200-200-20 MG/5ML suspension Take 30 mLs every 4 (four) hours as needed by mouth for indigestion. 355 mL 0  . Brinzolamide-Brimonidine (SIMBRINZA) 1-0.2 % SUSP Place 1 drop into both eyes 2 (two) times daily.     . Cholecalciferol (VITAMIN D3) 5000 units CAPS Take 1 capsule by mouth daily.     Marland Kitchen  esomeprazole (NEXIUM) 40 MG capsule TAKE ONE CAPSULE BY MOUTH TWO TIMES DAILY 60 capsule 5  . furosemide (LASIX) 40 MG tablet Take 20 mg by mouth 2 (two) times daily.    Marland Kitchen gabapentin (NEURONTIN) 300 MG capsule Take 300 mg at bedtime by mouth.    . latanoprost (XALATAN) 0.005 % ophthalmic solution Place 1 drop into both eyes at bedtime.      Marland Kitchen lisinopril (PRINIVIL,ZESTRIL) 5 MG tablet Take 1 tablet (5 mg total) by mouth daily. 90 tablet 3  . loratadine (CLARITIN) 10 MG tablet Take 10 mg by mouth as needed for allergies.    Marland Kitchen meclizine (ANTIVERT) 12.5 MG tablet Take 12.5 mg by mouth 3 (three) times daily as needed for dizziness.    . metoprolol tartrate (LOPRESSOR) 25 MG tablet Take 0.5 tablets (12.5 mg total) by mouth 2 (two) times daily. 90 tablet 3  . neomycin-polymyxin-hydrocortisone (CORTISPORIN) 3.5-10000-1 otic suspension Place 2 drops 4 (four) times daily as needed into both ears.     . potassium chloride SA (K-DUR,KLOR-CON) 20 MEQ tablet Take 20 mEq by mouth 2 (two) times daily.    . traMADol (ULTRAM) 50 MG tablet Take 1 tablet (50 mg total) every 6 (six) hours as needed by mouth for moderate pain. For pain 90 tablet 0  . traZODone (DESYREL) 100 MG tablet Take 1 tablet (100 mg total) at bedtime as needed by mouth for sleep.    Marland Kitchen warfarin (COUMADIN) 2 MG tablet Take 2 mg daily by mouth.      No current facility-administered medications for this visit.     Past  Medical History:  Diagnosis Date  . Adenomatous polyps    -resected via colonoscopy in 2011  . Anemia   . Anxiety    MR  . Atrial fibrillation (HCC)    EF of 45%  . Degenerative joint disease    status post right total hip arthroplasty  . Dysrhythmia   . GERD (gastroesophageal reflux disease)    -Barrett's esophagus  . Glaucoma   . Glaucoma   . Gout   . Hyperlipidemia    Recent lipid profile is excellent without lipid-lowering therapy  . Hypertension   . Mental retardation   . Premature ventricular contractions   .  Restless leg syndrome   . Shortness of breath dyspnea     Past Surgical History:  Procedure Laterality Date  . CATARACT EXTRACTION  2006   left eye  . COLONOSCOPY W/ POLYPECTOMY  2011  . ESOPHAGOGASTRODUODENOSCOPY N/A 01/01/2016   Procedure: ESOPHAGOGASTRODUODENOSCOPY (EGD);  Surgeon: Rogene Houston, MD;  [facility];  Service: Endoscopy;  Laterality: N/A;  200  . EYE SURGERY    . JOINT REPLACEMENT     x2  . RADIOLOGY WITH ANESTHESIA Right 11/21/2015   Procedure: MRI OF RIGHT KNEE WITHOUT CONTAST    (RADIOLOGY WITH ANESTHESIA);  Surgeon: Medication Radiologist, MD;  Location: [facility];  Service: Radiology;  Laterality: Right;  . TOTAL HIP ARTHROPLASTY  2006   Right    Social History   Socioeconomic History  . Marital status: Single    Spouse name: Not on file  . Number of children: Not on file  . Years of education: Not on file  . Highest education level: Not on file  Social Needs  . Financial resource strain: Not on file  . Food insecurity - worry: Not on file  . Food insecurity - inability: Not on file  . Transportation needs - medical: Not on file  . Transportation needs - non-medical: Not on file  Occupational History  . Occupation: disabled  Tobacco Use  . Smoking status: Never Smoker  . Smokeless tobacco: Never Used  Substance and Sexual Activity  . Alcohol use: No  . Drug use: No  . Sexual activity: Not on file  Other Topics Concern  . Not on file  Social History Narrative  . Not on file     Vitals:   01/11/18 1527  BP: (!) 89/50  Pulse: 68  Weight: 207 lb (93.9 kg)  Height: 5\' 5"  (1.651 m)    Wt Readings from Last 3 Encounters:  01/11/18 207 lb (93.9 kg)  09/20/17 203 lb 9.6 oz (92.4 kg)  07/26/17 201 lb (91.2 kg)     PHYSICAL EXAM General: NAD HEENT: Normal. Neck: No JVD, no thyromegaly. Lungs: Clear to auscultation bilaterally with normal respiratory effort. CV: Regular rate and irregular rhythm, normal S1/S2, no S3. No pretibial  or periankle edema.  Right leg in boot. Abdomen: Soft, nontender, no distention.  Neurologic: Alert.  Psych: Normal affect. Skin: Normal. Musculoskeletal: Right leg in boot    ECG: Most recent ECG reviewed.   Labs: Lab Results  Component Value Date/Time   K 4.4 09/19/2017 10:25 AM   BUN 6 09/19/2017 10:25 AM   CREATININE 1.08 (H) 09/19/2017 10:25 AM   ALT 17 09/17/2017 04:50 AM   TSH 1.525 12/26/2015 01:46 PM   TSH 3.852 11/25/2011 10:10 AM   HGB 9.5 (L) 09/20/2017 06:28 AM     Lipids: Lab Results  Component Value Date/Time   LDLCALC 75  07/27/2009   CHOL 132 07/27/2009   TRIG 69 07/27/2009   HDL 43 07/27/2009       ASSESSMENT AND PLAN:  1.  Permanent atrial fibrillation/flutter: Rate is controlled on current therapy.  She is no longer on metoprolol succinate but now takes metoprolol tartrate 12.5 mg twice daily. Warfarin managed by Dr. Anastasio Champion.  2. Chronic diastolic heart failure: Euvolemic and compensated.  However, blood pressure is low.  I will hold both lisinopril and Lasix along with potassium.  3.  Hypotension: Blood pressure is low and she has associated lightheadedness and dizziness.  I will discontinue lisinopril.  I will also hold Lasix for the time being.  4.  GERD: She continues to have symptoms and is currently taking Nexium.  I suspect she will be switched to Protonix 40 mg.  I will defer to her PCP and gastroenterologist.   Disposition: Follow up 1 year with me. Follow up with PCP tomorrow.   Kate Sable, M.D., F.A.C.C.

## 2018-01-12 ENCOUNTER — Other Ambulatory Visit: Payer: Self-pay | Admitting: Cardiovascular Disease

## 2018-01-12 DIAGNOSIS — Z7901 Long term (current) use of anticoagulants: Secondary | ICD-10-CM | POA: Diagnosis not present

## 2018-01-12 DIAGNOSIS — N182 Chronic kidney disease, stage 2 (mild): Secondary | ICD-10-CM | POA: Diagnosis not present

## 2018-01-12 DIAGNOSIS — R5383 Other fatigue: Secondary | ICD-10-CM | POA: Diagnosis not present

## 2018-01-12 DIAGNOSIS — E559 Vitamin D deficiency, unspecified: Secondary | ICD-10-CM | POA: Diagnosis not present

## 2018-01-12 DIAGNOSIS — J301 Allergic rhinitis due to pollen: Secondary | ICD-10-CM | POA: Diagnosis not present

## 2018-01-12 DIAGNOSIS — Z5181 Encounter for therapeutic drug level monitoring: Secondary | ICD-10-CM | POA: Diagnosis not present

## 2018-01-12 DIAGNOSIS — E785 Hyperlipidemia, unspecified: Secondary | ICD-10-CM | POA: Diagnosis not present

## 2018-01-13 DIAGNOSIS — I4891 Unspecified atrial fibrillation: Secondary | ICD-10-CM | POA: Diagnosis not present

## 2018-01-13 DIAGNOSIS — I129 Hypertensive chronic kidney disease with stage 1 through stage 4 chronic kidney disease, or unspecified chronic kidney disease: Secondary | ICD-10-CM | POA: Diagnosis not present

## 2018-01-13 DIAGNOSIS — J449 Chronic obstructive pulmonary disease, unspecified: Secondary | ICD-10-CM | POA: Diagnosis not present

## 2018-01-13 DIAGNOSIS — E878 Other disorders of electrolyte and fluid balance, not elsewhere classified: Secondary | ICD-10-CM | POA: Diagnosis not present

## 2018-01-13 DIAGNOSIS — R2689 Other abnormalities of gait and mobility: Secondary | ICD-10-CM | POA: Diagnosis not present

## 2018-01-13 DIAGNOSIS — N183 Chronic kidney disease, stage 3 (moderate): Secondary | ICD-10-CM | POA: Diagnosis not present

## 2018-01-17 DIAGNOSIS — I129 Hypertensive chronic kidney disease with stage 1 through stage 4 chronic kidney disease, or unspecified chronic kidney disease: Secondary | ICD-10-CM | POA: Diagnosis not present

## 2018-01-17 DIAGNOSIS — J449 Chronic obstructive pulmonary disease, unspecified: Secondary | ICD-10-CM | POA: Diagnosis not present

## 2018-01-17 DIAGNOSIS — E878 Other disorders of electrolyte and fluid balance, not elsewhere classified: Secondary | ICD-10-CM | POA: Diagnosis not present

## 2018-01-17 DIAGNOSIS — N183 Chronic kidney disease, stage 3 (moderate): Secondary | ICD-10-CM | POA: Diagnosis not present

## 2018-01-17 DIAGNOSIS — R2689 Other abnormalities of gait and mobility: Secondary | ICD-10-CM | POA: Diagnosis not present

## 2018-01-17 DIAGNOSIS — I4891 Unspecified atrial fibrillation: Secondary | ICD-10-CM | POA: Diagnosis not present

## 2018-01-19 ENCOUNTER — Encounter (INDEPENDENT_AMBULATORY_CARE_PROVIDER_SITE_OTHER): Payer: Self-pay | Admitting: Internal Medicine

## 2018-01-19 ENCOUNTER — Ambulatory Visit (INDEPENDENT_AMBULATORY_CARE_PROVIDER_SITE_OTHER): Payer: Medicare Other | Admitting: Internal Medicine

## 2018-01-19 VITALS — BP 140/90 | HR 60 | Temp 97.0°F | Ht 68.5 in | Wt 207.0 lb

## 2018-01-19 DIAGNOSIS — K219 Gastro-esophageal reflux disease without esophagitis: Secondary | ICD-10-CM

## 2018-01-19 DIAGNOSIS — K2270 Barrett's esophagus without dysplasia: Secondary | ICD-10-CM | POA: Diagnosis not present

## 2018-01-19 NOTE — Progress Notes (Signed)
Subjective:    Patient ID: [name], female    DOB: 03/01/1941, 77 y.o.   MRN: [id number] Wt 195 in March 2018, Ht  HPI Here today for f/u. Last seen in March of 2018. Hx of chronic GERD and Barrett's and maintained on Nexium BID. Recently did Rehab at [facility] before Thanksgiving for weakness. While she was at the [facility] she fell and broke her rt lower leg and her nose. She has a boot in place.  Her BMs are normal. No melena or BRRB.  Appetite is good.  She has gained 12 pounds since her last visit. In March.   Hx of atrial fib and maintained on Warfarin   01/01/2016 EGD: chronic GERD, Barrett's.  Impression: 4 cm long tubular Barrett's without ulceration or stricture. Moderate to large sliding hiatal hernia. No evidence of peptic ulcer disease. Four quadrant biopsies taken from distal and proximal segment of Barrett's mucosa  Biopsy shows Barrett's without dysplasia.  Her last colonoscopy was in 2011. Two small polyps. Small cecal ablated via cold biopsy. Biopsy: Hyperplastic polyp   Review of Systems Past Medical History:  Diagnosis Date  . Adenomatous polyps    -resected via colonoscopy in 2011  . Anemia   . Anxiety    MR  . Atrial fibrillation (HCC)    EF of 45%  . Degenerative joint disease    status post right total hip arthroplasty  . Dysrhythmia   . GERD (gastroesophageal reflux disease)    -Barrett's esophagus  . Glaucoma   . Glaucoma   . Gout   . Hyperlipidemia    Recent lipid profile is excellent without lipid-lowering therapy  . Hypertension   . Mental retardation   . Premature ventricular contractions   . Restless leg syndrome   . Shortness of breath dyspnea     Past Surgical History:  Procedure Laterality Date  . CATARACT EXTRACTION  2006   left eye  . COLONOSCOPY W/ POLYPECTOMY  2011  . ESOPHAGOGASTRODUODENOSCOPY N/A 01/01/2016   Procedure: ESOPHAGOGASTRODUODENOSCOPY (EGD);  Surgeon: Rogene Houston, MD;  [facility];  Service: Endoscopy;  Laterality: N/A;  200  . EYE SURGERY    . JOINT REPLACEMENT     x2  . RADIOLOGY WITH ANESTHESIA Right 11/21/2015   Procedure: MRI OF RIGHT KNEE WITHOUT CONTAST    (RADIOLOGY WITH ANESTHESIA);  Surgeon: Medication Radiologist, MD;  Location: [facility];  Service: Radiology;  Laterality: Right;  . TOTAL HIP ARTHROPLASTY  2006   Right    No Known Allergies  Current Outpatient Medications on File Prior to Visit  Medication Sig Dispense Refill  . allopurinol (ZYLOPRIM) 100 MG tablet Take 100 mg every evening by mouth.     Marland Kitchen alum & mag hydroxide-simeth (MAALOX/MYLANTA) 200-200-20 MG/5ML suspension Take 30 mLs every 4 (four) hours as needed by mouth for indigestion. 355 mL 0  . Brinzolamide-Brimonidine (SIMBRINZA) 1-0.2 % SUSP Place 1 drop into both eyes 2 (two) times daily.     . Cholecalciferol (VITAMIN D3) 5000 units CAPS Take 1 capsule by mouth daily.     Marland Kitchen esomeprazole (NEXIUM) 40 MG capsule TAKE ONE CAPSULE BY MOUTH TWO TIMES DAILY 60 capsule 5  . gabapentin (NEURONTIN) 300 MG capsule Take 300 mg at bedtime by mouth.    . latanoprost (XALATAN) 0.005 % ophthalmic solution Place 1 drop into both eyes at bedtime.      Marland Kitchen loratadine (CLARITIN) 10 MG tablet Take 10 mg by  mouth as needed for allergies.    Marland Kitchen meclizine (ANTIVERT) 12.5 MG tablet Take 12.5 mg by mouth 3 (three) times daily as needed for dizziness.    . metoprolol tartrate (LOPRESSOR) 25 MG tablet Take 0.5 tablets (12.5 mg total) by mouth 2 (two) times daily. 90 tablet 3  . neomycin-polymyxin-hydrocortisone (CORTISPORIN) 3.5-10000-1 otic suspension Place 2 drops 4 (four) times daily as needed into both ears.     . traMADol (ULTRAM) 50 MG tablet Take 1 tablet (50 mg total) every 6 (six) hours as needed by mouth for moderate pain. For pain 90 tablet 0  . traZODone (DESYREL) 100 MG tablet Take 1 tablet (100 mg total) at bedtime as needed by mouth for sleep.    Marland Kitchen warfarin (COUMADIN) 2 MG tablet Take 2 mg daily by  mouth.      No current facility-administered medications on file prior to visit.         Objective:   Physical Exam Blood pressure 140/90, pulse 60, temperature (!) 97 F (36.1 C), height 5' 8.5" (1.74 m), weight 207 lb (93.9 kg). Alert and oriented. Skin warm and dry. Oral mucosa is moist.   . Sclera anicteric, conjunctivae is pink. Thyroid not enlarged. No cervical lymphadenopathy. Lungs clear. Heart regular rate and rhythm.  Abdomen is soft. Bowel sounds are positive. No hepatomegaly. No abdominal masses felt. No tenderness.  No edema to lower extremities.   Rt orthopedic boot in place.          Assessment & Plan:  GERD/ Barrett's . She will continue the Nexium BID OV in 1 year.

## 2018-01-19 NOTE — Patient Instructions (Signed)
Continue the Nexium. OV in 1 year.

## 2018-01-25 DIAGNOSIS — R309 Painful micturition, unspecified: Secondary | ICD-10-CM | POA: Diagnosis not present

## 2018-01-25 DIAGNOSIS — N183 Chronic kidney disease, stage 3 (moderate): Secondary | ICD-10-CM | POA: Diagnosis not present

## 2018-01-25 DIAGNOSIS — R2689 Other abnormalities of gait and mobility: Secondary | ICD-10-CM | POA: Diagnosis not present

## 2018-01-25 DIAGNOSIS — R3915 Urgency of urination: Secondary | ICD-10-CM | POA: Diagnosis not present

## 2018-01-25 DIAGNOSIS — R350 Frequency of micturition: Secondary | ICD-10-CM | POA: Diagnosis not present

## 2018-01-25 DIAGNOSIS — I129 Hypertensive chronic kidney disease with stage 1 through stage 4 chronic kidney disease, or unspecified chronic kidney disease: Secondary | ICD-10-CM | POA: Diagnosis not present

## 2018-01-25 DIAGNOSIS — E878 Other disorders of electrolyte and fluid balance, not elsewhere classified: Secondary | ICD-10-CM | POA: Diagnosis not present

## 2018-01-25 DIAGNOSIS — I4891 Unspecified atrial fibrillation: Secondary | ICD-10-CM | POA: Diagnosis not present

## 2018-01-25 DIAGNOSIS — R8290 Unspecified abnormal findings in urine: Secondary | ICD-10-CM | POA: Diagnosis not present

## 2018-01-25 DIAGNOSIS — J449 Chronic obstructive pulmonary disease, unspecified: Secondary | ICD-10-CM | POA: Diagnosis not present

## 2018-02-02 DIAGNOSIS — S82832A Other fracture of upper and lower end of left fibula, initial encounter for closed fracture: Secondary | ICD-10-CM | POA: Diagnosis not present

## 2018-02-02 DIAGNOSIS — S82302A Unspecified fracture of lower end of left tibia, initial encounter for closed fracture: Secondary | ICD-10-CM | POA: Diagnosis not present

## 2018-02-03 DIAGNOSIS — E878 Other disorders of electrolyte and fluid balance, not elsewhere classified: Secondary | ICD-10-CM | POA: Diagnosis not present

## 2018-02-03 DIAGNOSIS — N183 Chronic kidney disease, stage 3 (moderate): Secondary | ICD-10-CM | POA: Diagnosis not present

## 2018-02-03 DIAGNOSIS — R2689 Other abnormalities of gait and mobility: Secondary | ICD-10-CM | POA: Diagnosis not present

## 2018-02-03 DIAGNOSIS — J449 Chronic obstructive pulmonary disease, unspecified: Secondary | ICD-10-CM | POA: Diagnosis not present

## 2018-02-03 DIAGNOSIS — I4891 Unspecified atrial fibrillation: Secondary | ICD-10-CM | POA: Diagnosis not present

## 2018-02-03 DIAGNOSIS — I129 Hypertensive chronic kidney disease with stage 1 through stage 4 chronic kidney disease, or unspecified chronic kidney disease: Secondary | ICD-10-CM | POA: Diagnosis not present

## 2018-02-04 DIAGNOSIS — I4891 Unspecified atrial fibrillation: Secondary | ICD-10-CM | POA: Diagnosis not present

## 2018-02-04 DIAGNOSIS — R2689 Other abnormalities of gait and mobility: Secondary | ICD-10-CM | POA: Diagnosis not present

## 2018-02-04 DIAGNOSIS — E878 Other disorders of electrolyte and fluid balance, not elsewhere classified: Secondary | ICD-10-CM | POA: Diagnosis not present

## 2018-02-04 DIAGNOSIS — J449 Chronic obstructive pulmonary disease, unspecified: Secondary | ICD-10-CM | POA: Diagnosis not present

## 2018-02-04 DIAGNOSIS — I129 Hypertensive chronic kidney disease with stage 1 through stage 4 chronic kidney disease, or unspecified chronic kidney disease: Secondary | ICD-10-CM | POA: Diagnosis not present

## 2018-02-04 DIAGNOSIS — N183 Chronic kidney disease, stage 3 (moderate): Secondary | ICD-10-CM | POA: Diagnosis not present

## 2018-02-07 DIAGNOSIS — I4891 Unspecified atrial fibrillation: Secondary | ICD-10-CM | POA: Diagnosis not present

## 2018-02-07 DIAGNOSIS — J449 Chronic obstructive pulmonary disease, unspecified: Secondary | ICD-10-CM | POA: Diagnosis not present

## 2018-02-07 DIAGNOSIS — E878 Other disorders of electrolyte and fluid balance, not elsewhere classified: Secondary | ICD-10-CM | POA: Diagnosis not present

## 2018-02-07 DIAGNOSIS — N183 Chronic kidney disease, stage 3 (moderate): Secondary | ICD-10-CM | POA: Diagnosis not present

## 2018-02-07 DIAGNOSIS — R2689 Other abnormalities of gait and mobility: Secondary | ICD-10-CM | POA: Diagnosis not present

## 2018-02-07 DIAGNOSIS — I129 Hypertensive chronic kidney disease with stage 1 through stage 4 chronic kidney disease, or unspecified chronic kidney disease: Secondary | ICD-10-CM | POA: Diagnosis not present

## 2018-02-08 DIAGNOSIS — R2689 Other abnormalities of gait and mobility: Secondary | ICD-10-CM | POA: Diagnosis not present

## 2018-02-08 DIAGNOSIS — N183 Chronic kidney disease, stage 3 (moderate): Secondary | ICD-10-CM | POA: Diagnosis not present

## 2018-02-08 DIAGNOSIS — J449 Chronic obstructive pulmonary disease, unspecified: Secondary | ICD-10-CM | POA: Diagnosis not present

## 2018-02-08 DIAGNOSIS — E878 Other disorders of electrolyte and fluid balance, not elsewhere classified: Secondary | ICD-10-CM | POA: Diagnosis not present

## 2018-02-08 DIAGNOSIS — I129 Hypertensive chronic kidney disease with stage 1 through stage 4 chronic kidney disease, or unspecified chronic kidney disease: Secondary | ICD-10-CM | POA: Diagnosis not present

## 2018-02-08 DIAGNOSIS — I4891 Unspecified atrial fibrillation: Secondary | ICD-10-CM | POA: Diagnosis not present

## 2018-02-09 ENCOUNTER — Other Ambulatory Visit: Payer: Self-pay | Admitting: Cardiovascular Disease

## 2018-02-09 DIAGNOSIS — N183 Chronic kidney disease, stage 3 (moderate): Secondary | ICD-10-CM | POA: Diagnosis not present

## 2018-02-09 DIAGNOSIS — I4891 Unspecified atrial fibrillation: Secondary | ICD-10-CM | POA: Diagnosis not present

## 2018-02-09 DIAGNOSIS — E878 Other disorders of electrolyte and fluid balance, not elsewhere classified: Secondary | ICD-10-CM | POA: Diagnosis not present

## 2018-02-09 DIAGNOSIS — R2689 Other abnormalities of gait and mobility: Secondary | ICD-10-CM | POA: Diagnosis not present

## 2018-02-09 DIAGNOSIS — J449 Chronic obstructive pulmonary disease, unspecified: Secondary | ICD-10-CM | POA: Diagnosis not present

## 2018-02-09 DIAGNOSIS — I129 Hypertensive chronic kidney disease with stage 1 through stage 4 chronic kidney disease, or unspecified chronic kidney disease: Secondary | ICD-10-CM | POA: Diagnosis not present

## 2018-02-11 DIAGNOSIS — N183 Chronic kidney disease, stage 3 (moderate): Secondary | ICD-10-CM | POA: Diagnosis not present

## 2018-02-11 DIAGNOSIS — I4891 Unspecified atrial fibrillation: Secondary | ICD-10-CM | POA: Diagnosis not present

## 2018-02-11 DIAGNOSIS — R2689 Other abnormalities of gait and mobility: Secondary | ICD-10-CM | POA: Diagnosis not present

## 2018-02-11 DIAGNOSIS — I129 Hypertensive chronic kidney disease with stage 1 through stage 4 chronic kidney disease, or unspecified chronic kidney disease: Secondary | ICD-10-CM | POA: Diagnosis not present

## 2018-02-11 DIAGNOSIS — E878 Other disorders of electrolyte and fluid balance, not elsewhere classified: Secondary | ICD-10-CM | POA: Diagnosis not present

## 2018-02-11 DIAGNOSIS — J449 Chronic obstructive pulmonary disease, unspecified: Secondary | ICD-10-CM | POA: Diagnosis not present

## 2018-02-13 DIAGNOSIS — Z9181 History of falling: Secondary | ICD-10-CM | POA: Diagnosis not present

## 2018-02-13 DIAGNOSIS — L89610 Pressure ulcer of right heel, unstageable: Secondary | ICD-10-CM | POA: Diagnosis not present

## 2018-02-13 DIAGNOSIS — I4891 Unspecified atrial fibrillation: Secondary | ICD-10-CM | POA: Diagnosis not present

## 2018-02-13 DIAGNOSIS — H409 Unspecified glaucoma: Secondary | ICD-10-CM | POA: Diagnosis not present

## 2018-02-13 DIAGNOSIS — Z96643 Presence of artificial hip joint, bilateral: Secondary | ICD-10-CM | POA: Diagnosis not present

## 2018-02-13 DIAGNOSIS — J449 Chronic obstructive pulmonary disease, unspecified: Secondary | ICD-10-CM | POA: Diagnosis not present

## 2018-02-13 DIAGNOSIS — Z5181 Encounter for therapeutic drug level monitoring: Secondary | ICD-10-CM | POA: Diagnosis not present

## 2018-02-13 DIAGNOSIS — S82891D Other fracture of right lower leg, subsequent encounter for closed fracture with routine healing: Secondary | ICD-10-CM | POA: Diagnosis not present

## 2018-02-13 DIAGNOSIS — I129 Hypertensive chronic kidney disease with stage 1 through stage 4 chronic kidney disease, or unspecified chronic kidney disease: Secondary | ICD-10-CM | POA: Diagnosis not present

## 2018-02-13 DIAGNOSIS — F79 Unspecified intellectual disabilities: Secondary | ICD-10-CM | POA: Diagnosis not present

## 2018-02-13 DIAGNOSIS — N183 Chronic kidney disease, stage 3 (moderate): Secondary | ICD-10-CM | POA: Diagnosis not present

## 2018-02-13 DIAGNOSIS — G629 Polyneuropathy, unspecified: Secondary | ICD-10-CM | POA: Diagnosis not present

## 2018-02-13 DIAGNOSIS — Z7901 Long term (current) use of anticoagulants: Secondary | ICD-10-CM | POA: Diagnosis not present

## 2018-02-13 DIAGNOSIS — K219 Gastro-esophageal reflux disease without esophagitis: Secondary | ICD-10-CM | POA: Diagnosis not present

## 2018-02-15 DIAGNOSIS — N183 Chronic kidney disease, stage 3 (moderate): Secondary | ICD-10-CM | POA: Diagnosis not present

## 2018-02-15 DIAGNOSIS — I4891 Unspecified atrial fibrillation: Secondary | ICD-10-CM | POA: Diagnosis not present

## 2018-02-15 DIAGNOSIS — S82891D Other fracture of right lower leg, subsequent encounter for closed fracture with routine healing: Secondary | ICD-10-CM | POA: Diagnosis not present

## 2018-02-15 DIAGNOSIS — J449 Chronic obstructive pulmonary disease, unspecified: Secondary | ICD-10-CM | POA: Diagnosis not present

## 2018-02-15 DIAGNOSIS — L89610 Pressure ulcer of right heel, unstageable: Secondary | ICD-10-CM | POA: Diagnosis not present

## 2018-02-15 DIAGNOSIS — I129 Hypertensive chronic kidney disease with stage 1 through stage 4 chronic kidney disease, or unspecified chronic kidney disease: Secondary | ICD-10-CM | POA: Diagnosis not present

## 2018-02-17 DIAGNOSIS — I4891 Unspecified atrial fibrillation: Secondary | ICD-10-CM | POA: Diagnosis not present

## 2018-02-17 DIAGNOSIS — N183 Chronic kidney disease, stage 3 (moderate): Secondary | ICD-10-CM | POA: Diagnosis not present

## 2018-02-17 DIAGNOSIS — J449 Chronic obstructive pulmonary disease, unspecified: Secondary | ICD-10-CM | POA: Diagnosis not present

## 2018-02-17 DIAGNOSIS — I129 Hypertensive chronic kidney disease with stage 1 through stage 4 chronic kidney disease, or unspecified chronic kidney disease: Secondary | ICD-10-CM | POA: Diagnosis not present

## 2018-02-17 DIAGNOSIS — L89610 Pressure ulcer of right heel, unstageable: Secondary | ICD-10-CM | POA: Diagnosis not present

## 2018-02-17 DIAGNOSIS — S82891D Other fracture of right lower leg, subsequent encounter for closed fracture with routine healing: Secondary | ICD-10-CM | POA: Diagnosis not present

## 2018-02-18 DIAGNOSIS — H04123 Dry eye syndrome of bilateral lacrimal glands: Secondary | ICD-10-CM | POA: Diagnosis not present

## 2018-02-18 DIAGNOSIS — Z961 Presence of intraocular lens: Secondary | ICD-10-CM | POA: Diagnosis not present

## 2018-02-18 DIAGNOSIS — H401133 Primary open-angle glaucoma, bilateral, severe stage: Secondary | ICD-10-CM | POA: Diagnosis not present

## 2018-02-18 DIAGNOSIS — H47233 Glaucomatous optic atrophy, bilateral: Secondary | ICD-10-CM | POA: Diagnosis not present

## 2018-02-22 DIAGNOSIS — N183 Chronic kidney disease, stage 3 (moderate): Secondary | ICD-10-CM | POA: Diagnosis not present

## 2018-02-22 DIAGNOSIS — S82891D Other fracture of right lower leg, subsequent encounter for closed fracture with routine healing: Secondary | ICD-10-CM | POA: Diagnosis not present

## 2018-02-22 DIAGNOSIS — I4891 Unspecified atrial fibrillation: Secondary | ICD-10-CM | POA: Diagnosis not present

## 2018-02-22 DIAGNOSIS — J449 Chronic obstructive pulmonary disease, unspecified: Secondary | ICD-10-CM | POA: Diagnosis not present

## 2018-02-22 DIAGNOSIS — L89610 Pressure ulcer of right heel, unstageable: Secondary | ICD-10-CM | POA: Diagnosis not present

## 2018-02-22 DIAGNOSIS — I129 Hypertensive chronic kidney disease with stage 1 through stage 4 chronic kidney disease, or unspecified chronic kidney disease: Secondary | ICD-10-CM | POA: Diagnosis not present

## 2018-02-23 DIAGNOSIS — J449 Chronic obstructive pulmonary disease, unspecified: Secondary | ICD-10-CM | POA: Diagnosis not present

## 2018-02-23 DIAGNOSIS — S82891D Other fracture of right lower leg, subsequent encounter for closed fracture with routine healing: Secondary | ICD-10-CM | POA: Diagnosis not present

## 2018-02-23 DIAGNOSIS — I129 Hypertensive chronic kidney disease with stage 1 through stage 4 chronic kidney disease, or unspecified chronic kidney disease: Secondary | ICD-10-CM | POA: Diagnosis not present

## 2018-02-23 DIAGNOSIS — N183 Chronic kidney disease, stage 3 (moderate): Secondary | ICD-10-CM | POA: Diagnosis not present

## 2018-02-23 DIAGNOSIS — L89610 Pressure ulcer of right heel, unstageable: Secondary | ICD-10-CM | POA: Diagnosis not present

## 2018-02-23 DIAGNOSIS — I4891 Unspecified atrial fibrillation: Secondary | ICD-10-CM | POA: Diagnosis not present

## 2018-02-24 DIAGNOSIS — N183 Chronic kidney disease, stage 3 (moderate): Secondary | ICD-10-CM | POA: Diagnosis not present

## 2018-02-24 DIAGNOSIS — S82891D Other fracture of right lower leg, subsequent encounter for closed fracture with routine healing: Secondary | ICD-10-CM | POA: Diagnosis not present

## 2018-02-24 DIAGNOSIS — L89610 Pressure ulcer of right heel, unstageable: Secondary | ICD-10-CM | POA: Diagnosis not present

## 2018-02-24 DIAGNOSIS — I129 Hypertensive chronic kidney disease with stage 1 through stage 4 chronic kidney disease, or unspecified chronic kidney disease: Secondary | ICD-10-CM | POA: Diagnosis not present

## 2018-02-24 DIAGNOSIS — J449 Chronic obstructive pulmonary disease, unspecified: Secondary | ICD-10-CM | POA: Diagnosis not present

## 2018-02-24 DIAGNOSIS — I4891 Unspecified atrial fibrillation: Secondary | ICD-10-CM | POA: Diagnosis not present

## 2018-02-28 DIAGNOSIS — J449 Chronic obstructive pulmonary disease, unspecified: Secondary | ICD-10-CM | POA: Diagnosis not present

## 2018-02-28 DIAGNOSIS — I4891 Unspecified atrial fibrillation: Secondary | ICD-10-CM | POA: Diagnosis not present

## 2018-02-28 DIAGNOSIS — S82891D Other fracture of right lower leg, subsequent encounter for closed fracture with routine healing: Secondary | ICD-10-CM | POA: Diagnosis not present

## 2018-02-28 DIAGNOSIS — I129 Hypertensive chronic kidney disease with stage 1 through stage 4 chronic kidney disease, or unspecified chronic kidney disease: Secondary | ICD-10-CM | POA: Diagnosis not present

## 2018-02-28 DIAGNOSIS — N183 Chronic kidney disease, stage 3 (moderate): Secondary | ICD-10-CM | POA: Diagnosis not present

## 2018-02-28 DIAGNOSIS — L89610 Pressure ulcer of right heel, unstageable: Secondary | ICD-10-CM | POA: Diagnosis not present

## 2018-03-01 DIAGNOSIS — N183 Chronic kidney disease, stage 3 (moderate): Secondary | ICD-10-CM | POA: Diagnosis not present

## 2018-03-01 DIAGNOSIS — J449 Chronic obstructive pulmonary disease, unspecified: Secondary | ICD-10-CM | POA: Diagnosis not present

## 2018-03-01 DIAGNOSIS — Z7901 Long term (current) use of anticoagulants: Secondary | ICD-10-CM | POA: Diagnosis not present

## 2018-03-01 DIAGNOSIS — I10 Essential (primary) hypertension: Secondary | ICD-10-CM | POA: Diagnosis not present

## 2018-03-01 DIAGNOSIS — R5383 Other fatigue: Secondary | ICD-10-CM | POA: Diagnosis not present

## 2018-03-01 DIAGNOSIS — L89610 Pressure ulcer of right heel, unstageable: Secondary | ICD-10-CM | POA: Diagnosis not present

## 2018-03-01 DIAGNOSIS — I129 Hypertensive chronic kidney disease with stage 1 through stage 4 chronic kidney disease, or unspecified chronic kidney disease: Secondary | ICD-10-CM | POA: Diagnosis not present

## 2018-03-01 DIAGNOSIS — S82891D Other fracture of right lower leg, subsequent encounter for closed fracture with routine healing: Secondary | ICD-10-CM | POA: Diagnosis not present

## 2018-03-01 DIAGNOSIS — E785 Hyperlipidemia, unspecified: Secondary | ICD-10-CM | POA: Diagnosis not present

## 2018-03-01 DIAGNOSIS — N390 Urinary tract infection, site not specified: Secondary | ICD-10-CM | POA: Diagnosis not present

## 2018-03-01 DIAGNOSIS — I4891 Unspecified atrial fibrillation: Secondary | ICD-10-CM | POA: Diagnosis not present

## 2018-03-04 DIAGNOSIS — S82891D Other fracture of right lower leg, subsequent encounter for closed fracture with routine healing: Secondary | ICD-10-CM | POA: Diagnosis not present

## 2018-03-04 DIAGNOSIS — J449 Chronic obstructive pulmonary disease, unspecified: Secondary | ICD-10-CM | POA: Diagnosis not present

## 2018-03-04 DIAGNOSIS — I129 Hypertensive chronic kidney disease with stage 1 through stage 4 chronic kidney disease, or unspecified chronic kidney disease: Secondary | ICD-10-CM | POA: Diagnosis not present

## 2018-03-04 DIAGNOSIS — L89610 Pressure ulcer of right heel, unstageable: Secondary | ICD-10-CM | POA: Diagnosis not present

## 2018-03-04 DIAGNOSIS — I4891 Unspecified atrial fibrillation: Secondary | ICD-10-CM | POA: Diagnosis not present

## 2018-03-04 DIAGNOSIS — N183 Chronic kidney disease, stage 3 (moderate): Secondary | ICD-10-CM | POA: Diagnosis not present

## 2018-03-07 DIAGNOSIS — S82891D Other fracture of right lower leg, subsequent encounter for closed fracture with routine healing: Secondary | ICD-10-CM | POA: Diagnosis not present

## 2018-03-07 DIAGNOSIS — I129 Hypertensive chronic kidney disease with stage 1 through stage 4 chronic kidney disease, or unspecified chronic kidney disease: Secondary | ICD-10-CM | POA: Diagnosis not present

## 2018-03-07 DIAGNOSIS — I4891 Unspecified atrial fibrillation: Secondary | ICD-10-CM | POA: Diagnosis not present

## 2018-03-07 DIAGNOSIS — J449 Chronic obstructive pulmonary disease, unspecified: Secondary | ICD-10-CM | POA: Diagnosis not present

## 2018-03-07 DIAGNOSIS — N183 Chronic kidney disease, stage 3 (moderate): Secondary | ICD-10-CM | POA: Diagnosis not present

## 2018-03-07 DIAGNOSIS — L89610 Pressure ulcer of right heel, unstageable: Secondary | ICD-10-CM | POA: Diagnosis not present

## 2018-03-08 DIAGNOSIS — N183 Chronic kidney disease, stage 3 (moderate): Secondary | ICD-10-CM | POA: Diagnosis not present

## 2018-03-08 DIAGNOSIS — I129 Hypertensive chronic kidney disease with stage 1 through stage 4 chronic kidney disease, or unspecified chronic kidney disease: Secondary | ICD-10-CM | POA: Diagnosis not present

## 2018-03-08 DIAGNOSIS — J449 Chronic obstructive pulmonary disease, unspecified: Secondary | ICD-10-CM | POA: Diagnosis not present

## 2018-03-08 DIAGNOSIS — S82891D Other fracture of right lower leg, subsequent encounter for closed fracture with routine healing: Secondary | ICD-10-CM | POA: Diagnosis not present

## 2018-03-08 DIAGNOSIS — I4891 Unspecified atrial fibrillation: Secondary | ICD-10-CM | POA: Diagnosis not present

## 2018-03-08 DIAGNOSIS — L89610 Pressure ulcer of right heel, unstageable: Secondary | ICD-10-CM | POA: Diagnosis not present

## 2018-03-10 DIAGNOSIS — L89610 Pressure ulcer of right heel, unstageable: Secondary | ICD-10-CM | POA: Diagnosis not present

## 2018-03-10 DIAGNOSIS — I4891 Unspecified atrial fibrillation: Secondary | ICD-10-CM | POA: Diagnosis not present

## 2018-03-10 DIAGNOSIS — N183 Chronic kidney disease, stage 3 (moderate): Secondary | ICD-10-CM | POA: Diagnosis not present

## 2018-03-10 DIAGNOSIS — I129 Hypertensive chronic kidney disease with stage 1 through stage 4 chronic kidney disease, or unspecified chronic kidney disease: Secondary | ICD-10-CM | POA: Diagnosis not present

## 2018-03-10 DIAGNOSIS — J449 Chronic obstructive pulmonary disease, unspecified: Secondary | ICD-10-CM | POA: Diagnosis not present

## 2018-03-10 DIAGNOSIS — S82891D Other fracture of right lower leg, subsequent encounter for closed fracture with routine healing: Secondary | ICD-10-CM | POA: Diagnosis not present

## 2018-03-21 DIAGNOSIS — S82301A Unspecified fracture of lower end of right tibia, initial encounter for closed fracture: Secondary | ICD-10-CM | POA: Diagnosis not present

## 2018-04-05 DIAGNOSIS — R0600 Dyspnea, unspecified: Secondary | ICD-10-CM | POA: Diagnosis not present

## 2018-04-11 ENCOUNTER — Emergency Department (HOSPITAL_COMMUNITY): Payer: Medicare Other

## 2018-04-11 ENCOUNTER — Encounter (HOSPITAL_COMMUNITY): Payer: Self-pay | Admitting: Emergency Medicine

## 2018-04-11 ENCOUNTER — Inpatient Hospital Stay (HOSPITAL_COMMUNITY)
Admission: EM | Admit: 2018-04-11 | Discharge: 2018-04-15 | DRG: 291 | Disposition: A | Discharge status: Home Health | Payer: Medicare Other | Attending: Internal Medicine | Admitting: Internal Medicine

## 2018-04-11 DIAGNOSIS — I480 Paroxysmal atrial fibrillation: Secondary | ICD-10-CM | POA: Diagnosis present

## 2018-04-11 DIAGNOSIS — R32 Unspecified urinary incontinence: Secondary | ICD-10-CM | POA: Diagnosis present

## 2018-04-11 DIAGNOSIS — G2581 Restless legs syndrome: Secondary | ICD-10-CM | POA: Diagnosis present

## 2018-04-11 DIAGNOSIS — E8770 Fluid overload, unspecified: Secondary | ICD-10-CM

## 2018-04-11 DIAGNOSIS — F79 Unspecified intellectual disabilities: Secondary | ICD-10-CM | POA: Diagnosis not present

## 2018-04-11 DIAGNOSIS — Z79891 Long term (current) use of opiate analgesic: Secondary | ICD-10-CM | POA: Diagnosis not present

## 2018-04-11 DIAGNOSIS — I5033 Acute on chronic diastolic (congestive) heart failure: Secondary | ICD-10-CM | POA: Diagnosis not present

## 2018-04-11 DIAGNOSIS — Z96641 Presence of right artificial hip joint: Secondary | ICD-10-CM | POA: Diagnosis present

## 2018-04-11 DIAGNOSIS — R600 Localized edema: Secondary | ICD-10-CM | POA: Diagnosis not present

## 2018-04-11 DIAGNOSIS — Z9842 Cataract extraction status, left eye: Secondary | ICD-10-CM | POA: Diagnosis not present

## 2018-04-11 DIAGNOSIS — F419 Anxiety disorder, unspecified: Secondary | ICD-10-CM | POA: Diagnosis present

## 2018-04-11 DIAGNOSIS — Z79899 Other long term (current) drug therapy: Secondary | ICD-10-CM

## 2018-04-11 DIAGNOSIS — K219 Gastro-esophageal reflux disease without esophagitis: Secondary | ICD-10-CM | POA: Diagnosis present

## 2018-04-11 DIAGNOSIS — I130 Hypertensive heart and chronic kidney disease with heart failure and stage 1 through stage 4 chronic kidney disease, or unspecified chronic kidney disease: Secondary | ICD-10-CM | POA: Diagnosis not present

## 2018-04-11 DIAGNOSIS — I340 Nonrheumatic mitral (valve) insufficiency: Secondary | ICD-10-CM | POA: Diagnosis not present

## 2018-04-11 DIAGNOSIS — K8020 Calculus of gallbladder without cholecystitis without obstruction: Secondary | ICD-10-CM | POA: Diagnosis not present

## 2018-04-11 DIAGNOSIS — H409 Unspecified glaucoma: Secondary | ICD-10-CM | POA: Diagnosis present

## 2018-04-11 DIAGNOSIS — Z7901 Long term (current) use of anticoagulants: Secondary | ICD-10-CM | POA: Diagnosis not present

## 2018-04-11 DIAGNOSIS — I4891 Unspecified atrial fibrillation: Secondary | ICD-10-CM | POA: Diagnosis present

## 2018-04-11 DIAGNOSIS — E039 Hypothyroidism, unspecified: Secondary | ICD-10-CM | POA: Diagnosis present

## 2018-04-11 DIAGNOSIS — I509 Heart failure, unspecified: Secondary | ICD-10-CM | POA: Diagnosis not present

## 2018-04-11 DIAGNOSIS — I5043 Acute on chronic combined systolic (congestive) and diastolic (congestive) heart failure: Secondary | ICD-10-CM | POA: Diagnosis present

## 2018-04-11 DIAGNOSIS — N183 Chronic kidney disease, stage 3 unspecified: Secondary | ICD-10-CM | POA: Diagnosis present

## 2018-04-11 DIAGNOSIS — E785 Hyperlipidemia, unspecified: Secondary | ICD-10-CM | POA: Diagnosis present

## 2018-04-11 DIAGNOSIS — J90 Pleural effusion, not elsewhere classified: Secondary | ICD-10-CM | POA: Diagnosis not present

## 2018-04-11 DIAGNOSIS — I5023 Acute on chronic systolic (congestive) heart failure: Secondary | ICD-10-CM | POA: Diagnosis not present

## 2018-04-11 LAB — URINALYSIS, ROUTINE W REFLEX MICROSCOPIC
BILIRUBIN URINE: NEGATIVE
GLUCOSE, UA: NEGATIVE mg/dL
HGB URINE DIPSTICK: NEGATIVE
Ketones, ur: NEGATIVE mg/dL
Leukocytes, UA: NEGATIVE
Nitrite: NEGATIVE
Protein, ur: NEGATIVE mg/dL
SPECIFIC GRAVITY, URINE: 1.009 (ref 1.005–1.030)
pH: 5 (ref 5.0–8.0)

## 2018-04-11 LAB — CBC WITH DIFFERENTIAL/PLATELET
BASOS ABS: 0 10*3/uL (ref 0.0–0.1)
Basophils Relative: 0 %
EOS ABS: 0.3 10*3/uL (ref 0.0–0.7)
EOS PCT: 5 %
HCT: 30.1 % — ABNORMAL LOW (ref 36.0–46.0)
Hemoglobin: 9.2 g/dL — ABNORMAL LOW (ref 12.0–15.0)
LYMPHS PCT: 21 %
Lymphs Abs: 1.1 10*3/uL (ref 0.7–4.0)
MCH: 25.7 pg — ABNORMAL LOW (ref 26.0–34.0)
MCHC: 30.6 g/dL (ref 30.0–36.0)
MCV: 84.1 fL (ref 78.0–100.0)
MONO ABS: 0.4 10*3/uL (ref 0.1–1.0)
Monocytes Relative: 8 %
Neutro Abs: 3.6 10*3/uL (ref 1.7–7.7)
Neutrophils Relative %: 66 %
PLATELETS: 256 10*3/uL (ref 150–400)
RBC: 3.58 MIL/uL — ABNORMAL LOW (ref 3.87–5.11)
RDW: 18.9 % — AB (ref 11.5–15.5)
WBC: 5.4 10*3/uL (ref 4.0–10.5)

## 2018-04-11 LAB — COMPREHENSIVE METABOLIC PANEL
ALK PHOS: 88 U/L (ref 38–126)
ALT: 8 U/L — AB (ref 14–54)
ANION GAP: 11 (ref 5–15)
AST: 16 U/L (ref 15–41)
Albumin: 3.3 g/dL — ABNORMAL LOW (ref 3.5–5.0)
BILIRUBIN TOTAL: 1 mg/dL (ref 0.3–1.2)
BUN: 13 mg/dL (ref 6–20)
CALCIUM: 8.8 mg/dL — AB (ref 8.9–10.3)
CO2: 32 mmol/L (ref 22–32)
CREATININE: 1.49 mg/dL — AB (ref 0.44–1.00)
Chloride: 98 mmol/L — ABNORMAL LOW (ref 101–111)
GFR calc Af Amer: 38 mL/min — ABNORMAL LOW (ref >60)
GFR, EST NON AFRICAN AMERICAN: 33 mL/min — AB (ref >60)
Glucose, Bld: 107 mg/dL — ABNORMAL HIGH (ref 65–99)
Potassium: 3.8 mmol/L (ref 3.5–5.1)
Sodium: 141 mmol/L (ref 135–145)
TOTAL PROTEIN: 6.8 g/dL (ref 6.5–8.1)

## 2018-04-11 LAB — PROTIME-INR
INR: 1.47
PROTHROMBIN TIME: 17.7 s — AB (ref 11.4–15.2)

## 2018-04-11 LAB — TROPONIN I: Troponin I: <0.03 ng/mL (ref <0.03)

## 2018-04-11 LAB — BRAIN NATRIURETIC PEPTIDE: B NATRIURETIC PEPTIDE 5: 219 pg/mL — AB (ref 0.0–100.0)

## 2018-04-11 MED ORDER — LATANOPROST 0.005 % OP SOLN
1.0000 [drp] | Freq: Every day | OPHTHALMIC | Status: DC
  Administered 2018-04-11 – 2018-04-14 (×4): 1 [drp] via OPHTHALMIC
  Filled 2018-04-11 (×3): qty 2.5

## 2018-04-11 MED ORDER — DILTIAZEM LOAD VIA INFUSION
10.0000 mg | Freq: Once | INTRAVENOUS | Status: AC
  Administered 2018-04-11: 10 mg via INTRAVENOUS
  Filled 2018-04-11: qty 10

## 2018-04-11 MED ORDER — VITAMIN D 1000 UNITS PO TABS
5000.0000 [IU] | ORAL_TABLET | Freq: Every day | ORAL | Status: DC
  Administered 2018-04-12 – 2018-04-15 (×4): 5000 [IU] via ORAL
  Filled 2018-04-11 (×4): qty 5

## 2018-04-11 MED ORDER — MECLIZINE HCL 12.5 MG PO TABS
12.5000 mg | ORAL_TABLET | Freq: Three times a day (TID) | ORAL | Status: DC | PRN
  Administered 2018-04-13 – 2018-04-14 (×2): 12.5 mg via ORAL
  Filled 2018-04-11 (×2): qty 1

## 2018-04-11 MED ORDER — LORATADINE 10 MG PO TABS
10.0000 mg | ORAL_TABLET | Freq: Every day | ORAL | Status: DC | PRN
Start: 2018-04-11 — End: 2018-04-15

## 2018-04-11 MED ORDER — FUROSEMIDE 10 MG/ML IJ SOLN
60.0000 mg | Freq: Once | INTRAMUSCULAR | Status: AC
  Administered 2018-04-11: 60 mg via INTRAVENOUS
  Filled 2018-04-11: qty 6

## 2018-04-11 MED ORDER — FUROSEMIDE 10 MG/ML IJ SOLN
20.0000 mg | Freq: Once | INTRAMUSCULAR | Status: AC
  Administered 2018-04-11: 20 mg via INTRAVENOUS
  Filled 2018-04-11: qty 2

## 2018-04-11 MED ORDER — TRAMADOL HCL 50 MG PO TABS
50.0000 mg | ORAL_TABLET | Freq: Four times a day (QID) | ORAL | Status: DC | PRN
  Administered 2018-04-11: 50 mg via ORAL
  Filled 2018-04-11: qty 1

## 2018-04-11 MED ORDER — METOPROLOL TARTRATE 25 MG PO TABS
12.5000 mg | ORAL_TABLET | Freq: Two times a day (BID) | ORAL | Status: DC
  Administered 2018-04-11 – 2018-04-15 (×7): 12.5 mg via ORAL
  Filled 2018-04-11 (×8): qty 1

## 2018-04-11 MED ORDER — GABAPENTIN 300 MG PO CAPS
300.0000 mg | ORAL_CAPSULE | Freq: Every day | ORAL | Status: DC
  Administered 2018-04-11 – 2018-04-14 (×4): 300 mg via ORAL
  Filled 2018-04-11 (×4): qty 1

## 2018-04-11 MED ORDER — WARFARIN - PHARMACIST DOSING INPATIENT: Freq: Every day | Status: DC

## 2018-04-11 MED ORDER — THYROID 30 MG PO TABS
30.0000 mg | ORAL_TABLET | Freq: Every day | ORAL | Status: DC
  Administered 2018-04-12 – 2018-04-15 (×4): 30 mg via ORAL
  Filled 2018-04-11 (×7): qty 1

## 2018-04-11 MED ORDER — ACETAMINOPHEN 325 MG PO TABS
650.0000 mg | ORAL_TABLET | ORAL | Status: DC | PRN
  Administered 2018-04-12 – 2018-04-14 (×3): 650 mg via ORAL
  Filled 2018-04-11 (×3): qty 2

## 2018-04-11 MED ORDER — WARFARIN SODIUM 3 MG PO TABS: 3.0000 mg | ORAL_TABLET | ORAL | Status: DC

## 2018-04-11 MED ORDER — SODIUM CHLORIDE 0.9 % IV SOLN: 250.0000 mL | INTRAVENOUS | Status: DC | PRN

## 2018-04-11 MED ORDER — ONDANSETRON HCL 4 MG/2ML IJ SOLN
4.0000 mg | Freq: Four times a day (QID) | INTRAMUSCULAR | Status: DC | PRN
  Administered 2018-04-11 – 2018-04-12 (×2): 4 mg via INTRAVENOUS
  Filled 2018-04-11 (×2): qty 2

## 2018-04-11 MED ORDER — FUROSEMIDE 10 MG/ML IJ SOLN
60.0000 mg | Freq: Two times a day (BID) | INTRAMUSCULAR | Status: DC
  Administered 2018-04-13 – 2018-04-15 (×5): 60 mg via INTRAVENOUS
  Filled 2018-04-11 (×6): qty 6

## 2018-04-11 MED ORDER — ALLOPURINOL 100 MG PO TABS
100.0000 mg | ORAL_TABLET | Freq: Every evening | ORAL | Status: DC
  Administered 2018-04-11 – 2018-04-14 (×4): 100 mg via ORAL
  Filled 2018-04-11 (×4): qty 1

## 2018-04-11 MED ORDER — SODIUM CHLORIDE 0.9% FLUSH: 3.0000 mL | INTRAVENOUS | Status: DC | PRN

## 2018-04-11 MED ORDER — DILTIAZEM HCL-DEXTROSE 100-5 MG/100ML-% IV SOLN (PREMIX)
5.0000 mg/h | INTRAVENOUS | Status: DC
  Administered 2018-04-11: 5 mg/h via INTRAVENOUS
  Filled 2018-04-11: qty 100

## 2018-04-11 MED ORDER — SODIUM CHLORIDE 0.9% FLUSH
3.0000 mL | Freq: Two times a day (BID) | INTRAVENOUS | Status: DC
  Administered 2018-04-12 – 2018-04-15 (×6): 3 mL via INTRAVENOUS

## 2018-04-11 NOTE — ED Notes (Signed)
Pt HR noted to increase 130'swhen pt arguing with family and is frequently pulling self up into sitting position. When lying still decreases low 100's

## 2018-04-11 NOTE — ED Notes (Signed)
Pt with bilateral edema from mid thigh to lower ext. Legs are tight. Noted to have swelling abd as well

## 2018-04-11 NOTE — ED Provider Notes (Signed)
[facility] Provider Note   CSN: [id number] Arrival date & time: 04/11/18  1539     History   Chief Complaint Chief Complaint  Patient presents with  . Leg Swelling  . Bloated    HPI [name] is a 77 y.o. female.  The history is provided by the patient, a caregiver and a relative. The history is limited by a developmental delay and the condition of the patient (Hx MR).    Pt was seen at 1555. Per pt and her family, c/o gradual onset and worsening of persistent "swelling" of her bilat LE's and abd for the past 9-10 days. Pt was evaluated by her PMD 6 days ago for this complaint, and was told to increase her lasix dose. Pt has been doing this without improvement. Pt's family states pt was SOB today while walking a short distance from her bed to stove. Pt lives alone in a trailer in her sister's backyard. Pt herself keeps repeating "I'm going home."   Past Medical History:  Diagnosis Date  . Adenomatous polyps    -resected via colonoscopy in 2011  . Anemia   . Anxiety    MR  . Atrial fibrillation (HCC)    EF of 45%  . Degenerative joint disease    status post right total hip arthroplasty  . Dysrhythmia   . GERD (gastroesophageal reflux disease)    -Barrett's esophagus  . Glaucoma   . Glaucoma   . Gout   . Hyperlipidemia    Recent lipid profile is excellent without lipid-lowering therapy  . Hypertension   . Mental retardation   . Premature ventricular contractions   . Restless leg syndrome   . Shortness of breath dyspnea     Patient Active Problem List   Diagnosis Date Noted  . Electrolyte depletion 09/18/2017  . Elevated alkaline phosphatase level 09/18/2017  . Acute renal failure superimposed on chronic kidney disease ([place])   . Elevated LFTs   . Hypomagnesemia   . Mental retardation   . Hypokalemia 09/11/2017  . Hyponatremia 09/11/2017  . Acute renal injury ([place]) 09/11/2017  . Dehydration 09/11/2017  . Supratherapeutic INR  09/11/2017  . Hyperbilirubinemia 09/11/2017  . Elevated troponin 09/11/2017  . Vulvovaginitis due to yeast 09/11/2017  . Syncope 12/26/2015  . Chronic anticoagulation 12/20/2012  . Chronic kidney disease, stage 3 ([place]) 12/20/2012  . Anemia, normocytic normochromic 12/20/2012  . History of diagnostic tests 12/20/2012  . Acute on chronic systolic heart failure ([place]) 11/25/2011  . Atrial fibrillation ([place])   . Degenerative joint disease   . GERD (gastroesophageal reflux disease)   . Gout   . Adenomatous polyps   . MENTAL RETARDATION 07/30/2009  . GLAUCOMA 06/25/2009    Past Surgical History:  Procedure Laterality Date  . CATARACT EXTRACTION  2006   left eye  . COLONOSCOPY W/ POLYPECTOMY  2011  . ESOPHAGOGASTRODUODENOSCOPY N/A 01/01/2016   Procedure: ESOPHAGOGASTRODUODENOSCOPY (EGD);  Surgeon: Rogene Houston, MD;  [facility];  Service: Endoscopy;  Laterality: N/A;  200  . EYE SURGERY    . JOINT REPLACEMENT     x2  . RADIOLOGY WITH ANESTHESIA Right 11/21/2015   Procedure: MRI OF RIGHT KNEE WITHOUT CONTAST    (RADIOLOGY WITH ANESTHESIA);  Surgeon: Medication Radiologist, MD;  Location: [facility];  Service: Radiology;  Laterality: Right;  . TOTAL HIP ARTHROPLASTY  2006   Right     OB History   None      Home  Medications    Prior to Admission medications   Medication Sig Start Date End Date Taking? Authorizing Provider  allopurinol (ZYLOPRIM) 100 MG tablet Take 100 mg every evening by mouth.     [provider]  alum & mag hydroxide-simeth (MAALOX/MYLANTA) 200-200-20 MG/5ML suspension Take 30 mLs every 4 (four) hours as needed by mouth for indigestion. 09/18/17   Debbe Odea, MD  Brinzolamide-Brimonidine [facility]) 1-0.2 % SUSP Place 1 drop into both eyes 2 (two) times daily.     [provider]  Cholecalciferol (VITAMIN D3) 5000 units CAPS Take 1 capsule by mouth daily.     [provider]  esomeprazole (NEXIUM) 40 MG capsule TAKE ONE  CAPSULE BY MOUTH TWO TIMES DAILY 03/12/16   Setzer, Rona Ravens, NP  gabapentin (NEURONTIN) 300 MG capsule Take 300 mg at bedtime by mouth. 08/23/17   [provider]  latanoprost (XALATAN) 0.005 % ophthalmic solution Place 1 drop into both eyes at bedtime.      [provider]  loratadine (CLARITIN) 10 MG tablet Take 10 mg by mouth as needed for allergies.    [provider]  meclizine (ANTIVERT) 12.5 MG tablet Take 12.5 mg by mouth 3 (three) times daily as needed for dizziness.    [provider]  metoprolol tartrate (LOPRESSOR) 25 MG tablet Take 0.5 tablets (12.5 mg total) by mouth 2 (two) times daily. 12/20/17 03/20/18  Herminio Commons, MD  neomycin-polymyxin-hydrocortisone (CORTISPORIN) 3.5-10000-1 otic suspension Place 2 drops 4 (four) times daily as needed into both ears.     [provider]  thyroid (ARMOUR) 30 MG tablet Take 30 mg by mouth daily before breakfast.    [provider]  traMADol (ULTRAM) 50 MG tablet Take 1 tablet (50 mg total) every 6 (six) hours as needed by mouth for moderate pain. For pain 09/18/17   Debbe Odea, MD  traZODone (DESYREL) 100 MG tablet Take 1 tablet (100 mg total) at bedtime as needed by mouth for sleep. 09/18/17   Debbe Odea, MD  warfarin (COUMADIN) 2 MG tablet Take 3 mg by mouth daily.     [provider]    Family History History reviewed. No pertinent family history.  Social History Social History   Tobacco Use  . Smoking status: Never Smoker  . Smokeless tobacco: Never Used  Substance Use Topics  . Alcohol use: No  . Drug use: No     Allergies   Patient has no known allergies.   Review of Systems Review of Systems  Unable to perform ROS: Other     Physical Exam Updated Vital Signs BP (!) 150/94 (BP Location: Right Arm)   Pulse (!) 124   Temp 98.5 F (36.9 C) (Oral)   Resp (!) 22   Wt 91.6 kg (202 lb)   SpO2 95%   BMI 30.27 kg/m    BP (!) 136/97 (BP  Location: Right Arm)   Pulse (!) 118   Temp 98.5 F (36.9 C) (Oral)   Resp 18   Wt 91.6 kg (202 lb)   SpO2 94%   BMI 30.27 kg/m    Physical Exam 1600: Physical examination:  Nursing notes reviewed; Vital signs and O2 SAT reviewed;  Constitutional: Well developed, Well nourished, In no acute distress; Head:  Normocephalic, atraumatic; Eyes: EOMI, PERRL, No scleral icterus; ENMT: Mouth and pharynx normal, Mucous membranes dry; Neck: Supple, Full range of motion, No lymphadenopathy; Cardiovascular: Irregular rate and rhythm, No gallop; Respiratory: Breath sounds clear & equal  bilaterally, No wheezes.  Speaking full sentences with ease, Normal respiratory effort/excursion; Chest: Nontender, Movement normal; Abdomen: Soft, Nontender, Nondistended, Normal bowel sounds; Genitourinary: No CVA tenderness; Extremities: Peripheral pulses normal, No deformity. No tenderness, +3 edema bilat LE's from feet to lower abd wall. No calf asymmetry.; Neuro: Awake, alert, confusion per baseline. Major CN grossly intact. No facial droop. Speech clear. No gross focal motor or sensory deficits in extremities. Climbs off wheelchair to stand and pivot to stretcher with assist x2.; Skin: Color normal, Warm, Dry.; Psych:  Easily agitated and starts yelling in loud voice.     ED Treatments / Results  Labs (all labs ordered are listed, but only abnormal results are displayed)   EKG EKG Interpretation  Date/Time:  Monday April 11 2018 16:04:21 EDT Ventricular Rate:  127 PR Interval:    QRS Duration: 94 QT Interval:  306 QTC Calculation: 445 R Axis:   124 Text Interpretation:  Atrial fibrillation Right axis deviation Low voltage, precordial leads Minimal ST depression, inferior leads Nonspecific ST and T wave abnormality Lateral leads Baseline wander Artifact When compared with ECG of 09/11/2017 QT has shortened rate is faster Confirmed by Francine Graven [phone]) on 04/11/2018 4:25:47  PM   Radiology   Procedures Procedures (including critical care time)  Medications Ordered in ED Medications - No data to display   Initial Impression / Assessment and Plan / ED Course  I have reviewed the triage vital signs and the nursing notes.  Pertinent labs & imaging results that were available during my care of the patient were reviewed by me and considered in my medical decision making (see chart for details).  MDM Reviewed: previous chart, nursing note and vitals Reviewed previous: labs and ECG Interpretation: labs, ECG and x-ray   Results for orders placed or performed during the hospital encounter of 04/11/18  Urinalysis, Routine w reflex microscopic  Result Value Ref Range   Color, Urine YELLOW YELLOW   APPearance CLEAR CLEAR   Specific Gravity, Urine 1.009 1.005 - 1.030   pH 5.0 5.0 - 8.0   Glucose, UA NEGATIVE NEGATIVE mg/dL   Hgb urine dipstick NEGATIVE NEGATIVE   Bilirubin Urine NEGATIVE NEGATIVE   Ketones, ur NEGATIVE NEGATIVE mg/dL   Protein, ur NEGATIVE NEGATIVE mg/dL   Nitrite NEGATIVE NEGATIVE   Leukocytes, UA NEGATIVE NEGATIVE  Comprehensive metabolic panel  Result Value Ref Range   Sodium 141 135 - 145 mmol/L   Potassium 3.8 3.5 - 5.1 mmol/L   Chloride 98 (L) 101 - 111 mmol/L   CO2 32 22 - 32 mmol/L   Glucose, Bld 107 (H) 65 - 99 mg/dL   BUN 13 6 - 20 mg/dL   Creatinine, Ser 1.49 (H) 0.44 - 1.00 mg/dL   Calcium 8.8 (L) 8.9 - 10.3 mg/dL   Total Protein 6.8 6.5 - 8.1 g/dL   Albumin 3.3 (L) 3.5 - 5.0 g/dL   AST 16 15 - 41 U/L   ALT 8 (L) 14 - 54 U/L   Alkaline Phosphatase 88 38 - 126 U/L   Total Bilirubin 1.0 0.3 - 1.2 mg/dL   GFR calc non Af Amer 33 (L) >60 mL/min   GFR calc Af Amer 38 (L) >60 mL/min   Anion gap 11 5 - 15  Brain natriuretic peptide  Result Value Ref Range   B Natriuretic Peptide 219.0 (H) 0.0 - 100.0 pg/mL  Troponin I  Result Value Ref Range   Troponin I <0.03 <0.03 ng/mL  CBC with Differential  Result Value Ref  Range   WBC 5.4 4.0 - 10.5 K/uL   RBC 3.58 (L) 3.87 - 5.11 MIL/uL   Hemoglobin 9.2 (L) 12.0 - 15.0 g/dL   HCT 30.1 (L) 36.0 - 46.0 %   MCV 84.1 78.0 - 100.0 fL   MCH 25.7 (L) 26.0 - 34.0 pg   MCHC 30.6 30.0 - 36.0 g/dL   RDW 18.9 (H) 11.5 - 15.5 %   Platelets 256 150 - 400 K/uL   Neutrophils Relative % 66 %   Neutro Abs 3.6 1.7 - 7.7 K/uL   Lymphocytes Relative 21 %   Lymphs Abs 1.1 0.7 - 4.0 K/uL   Monocytes Relative 8 %   Monocytes Absolute 0.4 0.1 - 1.0 K/uL   Eosinophils Relative 5 %   Eosinophils Absolute 0.3 0.0 - 0.7 K/uL   Basophils Relative 0 %   Basophils Absolute 0.0 0.0 - 0.1 K/uL  Protime-INR  Result Value Ref Range   Prothrombin Time 17.7 (H) 11.4 - 15.2 seconds   INR 1.47    Dg Chest 2 View Result Date: 04/11/2018 CLINICAL DATA:  Abdominal swelling.  History of CHF. EXAM: CHEST - 2 VIEW COMPARISON:  September 12, 2017 FINDINGS: Cardiomegaly. Mild edema. Small bilateral effusions with underlying atelectasis. IMPRESSION: Cardiomegaly, small effusions, and mild edema. Electronically Signed   By: Dorise Bullion III M.D   On: 04/11/2018 16:59   US Abdomen Complete Result Date: 04/11/2018 CLINICAL DATA:  Abdominal swelling EXAM: ABDOMEN ULTRASOUND COMPLETE COMPARISON:  Ultrasound 09/17/2017 FINDINGS: Gallbladder: Contracted gallbladder with gallstones measuring up to 8 mm. Gallbladder wall 3 mm Common bile duct: Diameter: 4 mm Liver: Increased echogenicity of the liver without focal lesion. Portal vein is patent on color Doppler imaging with normal direction of blood flow towards the liver. IVC: No abnormality visualized. Pancreas: Limited due to bowel gas Spleen: Small spleen 3.6 mm with accessory spleen 14 mm Right Kidney: Length: 8.3 cm. Echogenicity within normal limits. No mass or hydronephrosis visualized. Left Kidney: Length: . Not visualized. This may be due to bowel gas or absence of the kidney. Abdominal aorta: Not visualized due to bowel gas Other findings: No free  fluid IMPRESSION: Contracted gallbladder with gallstones.  Common bile duct 4 mm. Left kidney not visualized possibly due to absence or diminished visualization due to bowel gas. Exam detail significantly degraded by patient size and bowel gas. Electronically Signed   By: Franchot Gallo M.D.   On: 04/11/2018 16:55   Results for ITHA, KROEKER (MRN [id number]) as of 04/11/2018 17:36  Ref. Range 09/17/2017 04:50 09/18/2017 06:56 09/19/2017 10:25 04/11/2018 16:14  BUN Latest Ref Range: 6 - 20 mg/dL 10 7 6 13   Creatinine Latest Ref Range: 0.44 - 1.00 mg/dL 1.19 (H) 1.16 (H) 1.08 (H) 1.49 (H)    Results for DESTINEY, SANABIA (MRN [id number]) as of 04/11/2018 17:36  Ref. Range 09/17/2017 04:50 09/20/2017 06:28 04/11/2018 16:14  Hemoglobin Latest Ref Range: 12.0 - 15.0 g/dL 9.2 (L) 9.5 (L) 9.2 (L)  HCT Latest Ref Range: 36.0 - 46.0 % 27.1 (L) 29.7 (L) 30.1 (L)    1850:  Pt becomes easily agitated and starts to yell loudly and argue with family; HR immediately increases to 130's. Family at bedside calms pt; HR returns to 90's. Pt appears clinically fluid overloaded, BNP elevated (no old to compare) and edema seen on CXR; will give dose IV lasix. Dx and testing d/w pt and family.  Questions answered.  Verb understanding, agreeable to admit.  T/C returned from [facility] Dr. Shanon Brow, case discussed, including:  HPI, pertinent PM/SHx, VS/PE, dx testing, ED course and treatment:  Agreeable to admit, requests to dose another IV 60mg  lasix (for 80mg  total).      Final Clinical Impressions(s) / ED Diagnoses   Final diagnoses:  None    ED Discharge Orders    None       Francine Graven, DO 04/12/18 1757

## 2018-04-11 NOTE — ED Triage Notes (Signed)
Patient's family member states patient was taken to PCP for swelling to bilateral legs and abdomen last week. States she was put on a fluid pill but today noticed patient's abdomen was swollen, tight, and red.

## 2018-04-11 NOTE — ED Notes (Signed)
Patient placed on 2 liters of oxygen by nasal cannula due to O2 stas were 88 percent on room air.

## 2018-04-11 NOTE — ED Notes (Signed)
Pt taken to US

## 2018-04-11 NOTE — H&P (Signed)
History and Physical    [name]:[id number] DOB: Jul 26, 1941 DOA: 04/11/2018  PCP: Doree Albee, MD  Patient coming from:  home  Chief Complaint:   Sob, swelling  HPI: [name] is a 77 y.o. female with medical history significant of mental disability, anxiety, A. fib, congestive heart failure, lives alone in the backyard in a trailer close to her sisters.  All of the history is obtained from her sisters who are her primary caregivers.  She does not really comply with salt intake due to her mental disabilities.  She has been swelling in her legs all the way up into her thighs and her abdomen for several weeks now.  Her Lasix was recently increased from 40 mg daily to twice a day this past week and it is not really helping her a lot.  She has been urinating a lot but her swelling is not any better.  Patient does not understand that she has to raise her legs and keep them raised she likes to hang her legs off the bed all day long.  She has not had any fevers.  No nausea vomiting or diarrhea.  Today the sisters noticed that she was more short of breath with walking and she normally is.  Patient referred for admission for worsening heart failure requiring usage of IV diuretics.  Patient has agitated and upset that she is in her room in the stepdown with no windows and no light shining through the windows.  Her mental capabilities about that of a second grader her sisters.   Review of Systems: As per HPI otherwise 10 point review of systems negative per sisters  Past Medical History:  Diagnosis Date  . Adenomatous polyps    -resected via colonoscopy in 2011  . Anemia   . Anxiety    MR  . Atrial fibrillation (HCC)    EF of 45%  . Degenerative joint disease    status post right total hip arthroplasty  . Dysrhythmia   . GERD (gastroesophageal reflux disease)    -Barrett's esophagus  . Glaucoma   . Glaucoma   . Gout   . Hyperlipidemia    Recent lipid profile is excellent  without lipid-lowering therapy  . Hypertension   . Mental retardation   . Premature ventricular contractions   . Restless leg syndrome   . Shortness of breath dyspnea     Past Surgical History:  Procedure Laterality Date  . CATARACT EXTRACTION  2006   left eye  . COLONOSCOPY W/ POLYPECTOMY  2011  . ESOPHAGOGASTRODUODENOSCOPY N/A 01/01/2016   Procedure: ESOPHAGOGASTRODUODENOSCOPY (EGD);  Surgeon: Rogene Houston, MD;  Location: [facility];  Service: Endoscopy;  Laterality: N/A;  200  . EYE SURGERY    . JOINT REPLACEMENT     x2  . RADIOLOGY WITH ANESTHESIA Right 11/21/2015   Procedure: MRI OF RIGHT KNEE WITHOUT CONTAST    (RADIOLOGY WITH ANESTHESIA);  Surgeon: Medication Radiologist, MD;  Location: [facility];  Service: Radiology;  Laterality: Right;  . TOTAL HIP ARTHROPLASTY  2006   Right     reports that she has never smoked. She has never used smokeless tobacco. She reports that she does not drink alcohol or use drugs.  No Known Allergies  History reviewed. No pertinent family history.  No premature coronary artery disease  Prior to Admission medications   Medication Sig Start Date End Date Taking? Authorizing Provider  allopurinol (ZYLOPRIM) 100 MG tablet Take 100 mg every  evening by mouth.    Yes [provider]  Brinzolamide-Brimonidine (SIMBRINZA) 1-0.2 % SUSP Place 1 drop into both eyes 2 (two) times daily.    Yes [provider]  Cholecalciferol (VITAMIN D3) 5000 units CAPS Take 1 capsule by mouth daily.    Yes [provider]  esomeprazole (NEXIUM 24HR) 20 MG capsule Take 20 mg by mouth daily at 12 noon.   Yes [provider]  furosemide (LASIX) 40 MG tablet Take 40 mg by mouth 2 (two) times daily. In the morning and at 2P 08/23/17  Yes [provider]  gabapentin (NEURONTIN) 300 MG capsule Take 300 mg at bedtime by mouth. 08/23/17  Yes [provider]  latanoprost (XALATAN) 0.005 % ophthalmic solution Place 1 drop into  both eyes at bedtime.     Yes [provider]  loratadine (CLARITIN) 10 MG tablet Take 10 mg by mouth as needed for allergies.   Yes [provider]  meclizine (ANTIVERT) 12.5 MG tablet Take 12.5 mg by mouth 3 (three) times daily as needed for dizziness.   Yes [provider]  metoprolol tartrate (LOPRESSOR) 25 MG tablet Take 0.5 tablets (12.5 mg total) by mouth 2 (two) times daily. 12/20/17 04/11/18 Yes Herminio Commons, MD  neomycin-polymyxin-hydrocortisone (CORTISPORIN) 3.5-10000-1 otic suspension Place 2 drops 4 (four) times daily as needed into both ears.    Yes [provider]  ondansetron (ZOFRAN-ODT) 4 MG disintegrating tablet TAKE ONE Tab BY MOUTH 4-6 hours AS NEEDED for nausea 03/17/18  Yes [provider]  thyroid (ARMOUR) 30 MG tablet Take 30 mg by mouth daily before breakfast.   Yes [provider]  traMADol (ULTRAM) 50 MG tablet Take 1 tablet (50 mg total) every 6 (six) hours as needed by mouth for moderate pain. For pain 09/18/17  Yes Debbe Odea, MD  warfarin (COUMADIN) 2 MG tablet Take 3 mg by mouth every other day.    Yes [provider]  traZODone (DESYREL) 100 MG tablet Take 1 tablet (100 mg total) at bedtime as needed by mouth for sleep. Patient not taking: Reported on 04/11/2018 09/18/17   Debbe Odea, MD    Physical Exam: Vitals:   04/11/18 1724 04/11/18 1730 04/11/18 1830 04/11/18 1845  BP: (!) 136/97     Pulse: (!) 118 (!) 115    Resp: 18 17 15 20   Temp:      TempSrc:      SpO2: 94% 93%    Weight:          Constitutional: NAD, calm, comfortable Vitals:   04/11/18 1724 04/11/18 1730 04/11/18 1830 04/11/18 1845  BP: (!) 136/97     Pulse: (!) 118 (!) 115    Resp: 18 17 15 20   Temp:      TempSrc:      SpO2: 94% 93%    Weight:       Eyes: PERRL, lids and conjunctivae normal ENMT: Mucous membranes are moist. Posterior pharynx clear of any exudate or lesions.Normal dentition.  Neck: normal,  supple, no masses, no thyromegaly Respiratory: clear to auscultation bilaterally, no wheezing, no crackles. Normal respiratory effort. No accessory muscle use.  Cardiovascular: Regular rate and rhythm, no murmurs / rubs / gallops.  3+ extremity edema up to thighs with pannus edema. 2+ pedal pulses. No carotid bruits.  Abdomen: no tenderness, no masses palpated. No hepatosplenomegaly. Bowel sounds positive.  Musculoskeletal: no clubbing / cyanosis. No joint deformity upper and lower extremities. Good ROM, no contractures.  Normal muscle tone.  Skin: no rashes, lesions, ulcers. No induration edema as above Neurologic: CN 2-12 grossly intact. Sensation intact, DTR normal. Strength 5/5 in all 4.  Psychiatric: Normal judgment and insight. Alert and oriented x 3. Normal mood.    Labs on Admission: I have personally reviewed following labs and imaging studies  CBC: Recent Labs  Lab 04/11/18 1614  WBC 5.4  NEUTROABS 3.6  HGB 9.2*  HCT 30.1*  MCV 84.1  PLT 633   Basic Metabolic Panel: Recent Labs  Lab 04/11/18 1614  NA 141  K 3.8  CL 98*  CO2 32  GLUCOSE 107*  BUN 13  CREATININE 1.49*  CALCIUM 8.8*   GFR: Estimated Creatinine Clearance: 38.4 mL/min (A) (by C-G formula based on SCr of 1.49 mg/dL (H)). Liver Function Tests: Recent Labs  Lab 04/11/18 1614  AST 16  ALT 8*  ALKPHOS 88  BILITOT 1.0  PROT 6.8  ALBUMIN 3.3*   No results for input(s): LIPASE, AMYLASE in the last 168 hours. No results for input(s): AMMONIA in the last 168 hours. Coagulation Profile: Recent Labs  Lab 04/11/18 1614  INR 1.47   Cardiac Enzymes: Recent Labs  Lab 04/11/18 1614  TROPONINI <0.03   BNP (last 3 results) No results for input(s): PROBNP in the last 8760 hours. HbA1C: No results for input(s): HGBA1C in the last 72 hours. CBG: No results for input(s): GLUCAP in the last 168 hours. Lipid Profile: No results for input(s): CHOL, HDL, LDLCALC, TRIG, CHOLHDL, LDLDIRECT in the last 72  hours. Thyroid Function Tests: No results for input(s): TSH, T4TOTAL, FREET4, T3FREE, THYROIDAB in the last 72 hours. Anemia Panel: No results for input(s): VITAMINB12, FOLATE, FERRITIN, TIBC, IRON, RETICCTPCT in the last 72 hours. Urine analysis:    Component Value Date/Time   COLORURINE YELLOW 04/11/2018 1602   APPEARANCEUR CLEAR 04/11/2018 1602   LABSPEC 1.009 04/11/2018 1602   PHURINE 5.0 04/11/2018 1602   GLUCOSEU NEGATIVE 04/11/2018 1602   HGBUR NEGATIVE 04/11/2018 1602   BILIRUBINUR NEGATIVE 04/11/2018 1602   KETONESUR NEGATIVE 04/11/2018 1602   PROTEINUR NEGATIVE 04/11/2018 1602   NITRITE NEGATIVE 04/11/2018 1602   LEUKOCYTESUR NEGATIVE 04/11/2018 1602   Sepsis Labs: !!!!!!!!!!!!!!!!!!!!!!!!!!!!!!!!!!!!!!!!!!!! @LABRCNTIP (procalcitonin:4,lacticidven:4) )No results found for this or any previous visit (from the past 240 hour(s)).   Radiological Exams on Admission: Dg Chest 2 View  Result Date: 04/11/2018 CLINICAL DATA:  Abdominal swelling.  History of CHF. EXAM: CHEST - 2 VIEW COMPARISON:  September 12, 2017 FINDINGS: Cardiomegaly. Mild edema. Small bilateral effusions with underlying atelectasis. IMPRESSION: Cardiomegaly, small effusions, and mild edema. Electronically Signed   By: Dorise Bullion III M.D   On: 04/11/2018 16:59   US Abdomen Complete  Result Date: 04/11/2018 CLINICAL DATA:  Abdominal swelling EXAM: ABDOMEN ULTRASOUND COMPLETE COMPARISON:  Ultrasound 09/17/2017 FINDINGS: Gallbladder: Contracted gallbladder with gallstones measuring up to 8 mm. Gallbladder wall 3 mm Common bile duct: Diameter: 4 mm Liver: Increased echogenicity of the liver without focal lesion. Portal vein is patent on color Doppler imaging with normal direction of blood flow towards the liver. IVC: No abnormality visualized. Pancreas: Limited due to bowel gas Spleen: Small spleen 3.6 mm with accessory spleen 14 mm Right Kidney: Length: 8.3 cm. Echogenicity within normal limits. No mass or  hydronephrosis visualized. Left Kidney: Length: . Not visualized. This may be due to bowel gas or absence of the kidney. Abdominal aorta: Not visualized due to bowel gas Other findings: No free fluid IMPRESSION: Contracted gallbladder with gallstones.  Common bile duct 4 mm. Left kidney not visualized possibly due to absence or diminished visualization due to bowel gas. Exam detail significantly degraded by patient size and bowel gas. Electronically Signed   By: Franchot Gallo M.D.   On: 04/11/2018 16:55    EKG: Independently reviewed. afib with rvr no acute issues Old chart reviewed cxr reviewed with edema b effusions no focal infiltrate Case discussed with dr Elise Benne in the ED  Assessment/Plan 77 year old female with acute on chronic congestive heart failure Principal Problem:   Acute on chronic systolic heart failure (HCC)-suspect diastolic in etiology has not had a echo in over 6 months.  Repeat cardiac echo.  We will increase her Lasix to 60 mg IV every 12 hours.  Will place TED hose.  Low-salt diet cardiac diet.  Control her heart rate.   Active Problems:   MENTAL RETARDATION-noted    Chronic anticoagulation-continue Coumadin and consult pharmacy for dosing    Chronic kidney disease, stage 3 (HCC)-this is stable at a creatinine of 1.5    Atrial fibrillation with RVR (HCC)-continue diltiazem drip.  I suspect this will be hard to control as patient just gets frustrated for being here due to her mental incapacity to understand.  This is likely contributing to her rapid heart rate.  Try to minimize sedating agents.     DVT prophylaxis: coumadin Code Status:  full Family Communication: Sisters Disposition Plan:  Per day team Consults called:  none Admission status:  admission   Magdelyn Roebuck A MD [facility]  If 7PM-7AM, please contact night-coverage [email or web address] Password [email or web address]  04/11/2018, 7:04 PM

## 2018-04-12 ENCOUNTER — Inpatient Hospital Stay (HOSPITAL_COMMUNITY): Payer: Medicare Other

## 2018-04-12 ENCOUNTER — Other Ambulatory Visit: Payer: Self-pay

## 2018-04-12 DIAGNOSIS — I5023 Acute on chronic systolic (congestive) heart failure: Secondary | ICD-10-CM

## 2018-04-12 DIAGNOSIS — I340 Nonrheumatic mitral (valve) insufficiency: Secondary | ICD-10-CM

## 2018-04-12 DIAGNOSIS — Z7901 Long term (current) use of anticoagulants: Secondary | ICD-10-CM

## 2018-04-12 DIAGNOSIS — I4891 Unspecified atrial fibrillation: Secondary | ICD-10-CM

## 2018-04-12 DIAGNOSIS — N183 Chronic kidney disease, stage 3 (moderate): Secondary | ICD-10-CM

## 2018-04-12 LAB — ECHOCARDIOGRAM COMPLETE
HEIGHTINCHES: 68 in
WEIGHTICAEL: 3216.95 [oz_av]

## 2018-04-12 LAB — PROTIME-INR
INR: 1.63
Prothrombin Time: 19.2 seconds — ABNORMAL HIGH (ref 11.4–15.2)

## 2018-04-12 LAB — BASIC METABOLIC PANEL
Anion gap: 10 (ref 5–15)
BUN: 12 mg/dL (ref 6–20)
CHLORIDE: 97 mmol/L — AB (ref 101–111)
CO2: 34 mmol/L — AB (ref 22–32)
CREATININE: 1.44 mg/dL — AB (ref 0.44–1.00)
Calcium: 8.6 mg/dL — ABNORMAL LOW (ref 8.9–10.3)
GFR calc Af Amer: 40 mL/min — ABNORMAL LOW (ref >60)
GFR calc non Af Amer: 34 mL/min — ABNORMAL LOW (ref >60)
GLUCOSE: 95 mg/dL (ref 65–99)
Potassium: 3.6 mmol/L (ref 3.5–5.1)
Sodium: 141 mmol/L (ref 135–145)

## 2018-04-12 LAB — TROPONIN I: Troponin I: <0.03 ng/mL (ref <0.03)

## 2018-04-12 LAB — MAGNESIUM: Magnesium: 1.4 mg/dL — ABNORMAL LOW (ref 1.7–2.4)

## 2018-04-12 LAB — MRSA PCR SCREENING: MRSA by PCR: NEGATIVE

## 2018-04-12 MED ORDER — ORAL CARE MOUTH RINSE
15.0000 mL | Freq: Two times a day (BID) | OROMUCOSAL | Status: DC
  Administered 2018-04-12 – 2018-04-15 (×2): 15 mL via OROMUCOSAL

## 2018-04-12 MED ORDER — WARFARIN SODIUM 1 MG PO TABS
1.0000 mg | ORAL_TABLET | Freq: Once | ORAL | Status: AC
  Administered 2018-04-12: 1 mg via ORAL
  Filled 2018-04-12: qty 1

## 2018-04-12 MED ORDER — WARFARIN - PHARMACIST DOSING INPATIENT
Status: DC
  Administered 2018-04-14: 18:00:00

## 2018-04-12 MED ORDER — PERFLUTREN LIPID MICROSPHERE
1.0000 mL | INTRAVENOUS | Status: AC | PRN
  Administered 2018-04-12: 3 mL via INTRAVENOUS
  Filled 2018-04-12: qty 10

## 2018-04-12 MED ORDER — MAGNESIUM SULFATE 4 GM/100ML IV SOLN
4.0000 g | Freq: Once | INTRAVENOUS | Status: AC
  Administered 2018-04-12: 4 g via INTRAVENOUS
  Filled 2018-04-12: qty 100

## 2018-04-12 MED ORDER — PANTOPRAZOLE SODIUM 40 MG PO TBEC
40.0000 mg | DELAYED_RELEASE_TABLET | Freq: Every day | ORAL | Status: DC
  Administered 2018-04-12 – 2018-04-15 (×4): 40 mg via ORAL
  Filled 2018-04-12 (×4): qty 1

## 2018-04-12 MED ORDER — TRAZODONE HCL 50 MG PO TABS
100.0000 mg | ORAL_TABLET | Freq: Every evening | ORAL | Status: DC | PRN
  Administered 2018-04-12 – 2018-04-13 (×2): 100 mg via ORAL
  Filled 2018-04-12 (×2): qty 2

## 2018-04-12 NOTE — Progress Notes (Signed)
  Echocardiogram 2D Echocardiogram with definity has been performed.  [name] M 04/12/2018, 1:50 PM

## 2018-04-12 NOTE — Progress Notes (Signed)
This RN spoke with Dr. Olevia Bowens- adv to hold Cardizem gtt at this time as well as 0600 dose of Lasix. Adv to let day shift RN know and see if pts day shift MD wants to give.  BP-97/62- HR 62. Will continue to monitor pt

## 2018-04-12 NOTE — Care Management Note (Signed)
Case Management Note  Patient Details  Name: [name] MRN: [id number] Date of Birth: 03/26/1941  Subjective/Objective:  CHF. From home with family. Patient has intellectual disability. Niece is her CAP aide. Patient walks with RW.  Has WC, BSC and shower chair. Has PCP. Patient has had PT with [place]. Recommended again. Family agreeable.                Action/Plan: Juliann Pulse of [facility] notified and will obtain orders via Epic when available.    Expected Discharge Date:   04/14/2018               Expected Discharge Plan:  [place]  In-House Referral:     Discharge planning Services  CM Consult  Post Acute Care Choice:  Home Health Choice offered to:  [facility] POA / Guardian  DME Arranged:    DME Agency:     HH Arranged:  PT [place]:  [place]  Status of Service:  Completed, signed off  If discussed at [place] of Stay Meetings, dates discussed:    Additional Comments:  Diar Berkel, Chauncey [place], RN 04/12/2018, 11:46 AM

## 2018-04-12 NOTE — Progress Notes (Signed)
Dr. Olevia Bowens paged and made aware of pts BP in the 90s to low 100s as well as Cardizem gtt decreased to 2.10mcg/hr @ 0341 d/t low BP and HR.  Adv pt is not yet on po Cardizem. Waiting for orders/call back. Will continue to monitor pt

## 2018-04-12 NOTE — Evaluation (Signed)
Physical Therapy Evaluation Patient Details Name: [name] MRN: [id number] DOB: 1941/02/26 Today's Date: 04/12/2018   History of Present Illness  [name] is a 77 y.o. female with medical history significant of mental disability, anxiety, A. fib, congestive heart failure, lives alone in the backyard in a trailer close to her sisters.  All of the history is obtained from her sisters who are her primary caregivers.  She does not really comply with salt intake due to her mental disabilities.  She has been swelling in her legs all the way up into her thighs and her abdomen for several weeks now.  Her Lasix was recently increased from 40 mg daily to twice a day this past week and it is not really helping her a lot.  She has been urinating a lot but her swelling is not any better.  Patient does not understand that she has to raise her legs and keep them raised she likes to hang her legs off the bed all day long.  She has not had any fevers.  No nausea vomiting or diarrhea.  Today the sisters noticed that she was more short of breath with walking and she normally is.  Patient referred for admission for worsening heart failure requiring usage of IV diuretics.  Patient has agitated and upset that she is in her room in the stepdown with no windows and no light shining through the windows.  Her mental capabilities about that of a second grader her sisters.    Clinical Impression  Patient functioning near baseline for functional mobility and gait, demonstrates slow slightly labored movement and limited to ambulation in room due to c/o fatigue and tolerated sitting up in chair after therapy.  Patient will benefit from continued physical therapy in hospital and recommended venue below to increase strength, balance, endurance for safe ADLs and gait.    Follow Up Recommendations Home health PT;Supervision for mobility/OOB    Equipment Recommendations  None recommended by PT    Recommendations for Other  Services       Precautions / Restrictions Precautions Precautions: Fall Restrictions Weight Bearing Restrictions: No      Mobility  Bed Mobility Overal bed mobility: Needs Assistance Bed Mobility: Supine to Sit     Supine to sit: Supervision        Transfers Overall transfer level: Needs assistance Equipment used: Rolling walker (2 wheeled) Transfers: Sit to/from [facility] Sit to Stand: Min guard Stand pivot transfers: Min guard       General transfer comment: slow labored movement  Ambulation/Gait Ambulation/Gait assistance: Min guard Ambulation Distance (Feet): 18 Feet Assistive device: Rolling walker (2 wheeled) Gait Pattern/deviations: Decreased step length - right;Decreased step length - left;Decreased stride length Gait velocity: slow   General Gait Details: demonstrates slow labored cadence without loss of balance, limited secondary to c/o fatigue  Stairs            Wheelchair Mobility    Modified Rankin (Stroke Patients Only)       Balance Overall balance assessment: Needs assistance Sitting-balance support: Feet supported;No upper extremity supported Sitting balance-Leahy Scale: Good     Standing balance support: Bilateral upper extremity supported;During functional activity Standing balance-Leahy Scale: Fair                               Pertinent Vitals/Pain Pain Assessment: No/denies pain    Home Living Family/patient expects to be discharged to::  Private residence Living Arrangements: Other relatives Available Help at Discharge: Family;Personal care attendant Type of Home: Mobile home Home Access: Ramped entrance     [place]: [place]: [place] - 2 wheels;Bedside commode;Wheelchair - manual;Shower seat;Hospital bed      Prior Function Level of Independence: Needs assistance   Gait / Transfers Assistance Needed: household gait with RW  ADL's / Homemaking Assistance Needed:  assisted by family        Hand Dominance   Dominant Hand: Right    Extremity/Trunk Assessment   Upper Extremity Assessment Upper Extremity Assessment: Generalized weakness    Lower Extremity Assessment Lower Extremity Assessment: Generalized weakness    Cervical / Trunk Assessment Cervical / Trunk Assessment: Normal  Communication   Communication: No difficulties  Cognition Arousal/Alertness: Awake/alert Behavior During Therapy: WFL for tasks assessed/performed Overall Cognitive Status: History of cognitive impairments - at baseline                                 General Comments: will follow directions with encouragement      General Comments      Exercises     Assessment/Plan    PT Assessment Patient needs continued PT services  PT Problem List Decreased strength;Decreased activity tolerance;Decreased balance;Decreased mobility       PT Treatment Interventions Gait training;Stair training;Functional mobility training;Therapeutic activities;Therapeutic exercise;Patient/family education    PT Goals (Current goals can be found in the Care Plan section)  Acute Rehab PT Goals Patient Stated Goal: return home with family to assist PT Goal Formulation: With patient Time For Goal Achievement: 04/19/18 Potential to Achieve Goals: Good    Frequency Min 3X/week   Barriers to discharge        Co-evaluation               AM-PAC PT "6 Clicks" Daily Activity  Outcome Measure Difficulty turning over in bed (including adjusting bedclothes, sheets and blankets)?: None Difficulty moving from lying on back to sitting on the side of the bed? : None Difficulty sitting down on and standing up from a chair with arms (e.g., wheelchair, bedside commode, etc,.)?: A Little Help needed moving to and from a bed to chair (including a wheelchair)?: A Little Help needed walking in hospital room?: A Little Help needed climbing 3-5 steps with a railing? : A  Lot 6 Click Score: 19    End of Session   Activity Tolerance: Patient tolerated treatment well;Patient limited by fatigue Patient left: in chair;with call bell/phone within reach Nurse Communication: Mobility status;Other (comment)(RN notified that patient left up in chair) PT Visit Diagnosis: Unsteadiness on feet (R26.81);Other abnormalities of gait and mobility (R26.89);Muscle weakness (generalized) (M62.81)    Time: 5885-0277 PT Time Calculation (min) (ACUTE ONLY): 24 min   Charges:   PT Evaluation $PT Eval Moderate Complexity: 1 Mod PT Treatments $Therapeutic Activity: 23-37 mins   PT G Codes:        1:37 PM, 2018-04-18 Lonell Grandchild, MPT Physical Therapist with [facility] [phone] office [phone] mobile phone

## 2018-04-12 NOTE — Progress Notes (Signed)
[place] [place] for Warfarin Indication: atrial fibrillation  No Known Allergies  Patient Measurements: Height: 5\' 8"  (172.7 cm) Weight: 201 lb 1 oz (91.2 kg) IBW/kg (Calculated) : 63.9 Heparin Dosing Weight:   Vital Signs: Temp: 97.8 F (36.6 C) (06/11 0813) Temp Source: Axillary (06/11 0813) BP: 84/52 (06/11 0600) Pulse Rate: 68 (06/11 0600)  Labs: Recent Labs    04/11/18 1614 04/11/18 1922 04/12/18 0108 04/12/18 0709  HGB 9.2*  --   --   --   HCT 30.1*  --   --   --   PLT 256  --   --   --   LABPROT 17.7*  --   --  19.2*  INR 1.47  --   --  1.63  CREATININE 1.49*  --   --  1.44*  TROPONINI <0.03 <0.03 <0.03 <0.03    Estimated Creatinine Clearance: 39.2 mL/min (A) (by C-G formula based on SCr of 1.44 mg/dL (H)).   Medical History: Past Medical History:  Diagnosis Date  . Adenomatous polyps    -resected via colonoscopy in 2011  . Anemia   . Anxiety    MR  . Atrial fibrillation (HCC)    EF of 45%  . Degenerative joint disease    status post right total hip arthroplasty  . Dysrhythmia   . GERD (gastroesophageal reflux disease)    -Barrett's esophagus  . Glaucoma   . Glaucoma   . Gout   . Hyperlipidemia    Recent lipid profile is excellent without lipid-lowering therapy  . Hypertension   . Mental retardation   . Premature ventricular contractions   . Restless leg syndrome   . Shortness of breath dyspnea    Medications:  Medications Prior to Admission  Medication Sig Dispense Refill Last Dose  . allopurinol (ZYLOPRIM) 100 MG tablet Take 100 mg every evening by mouth.    04/10/2018 at Unknown time  . Brinzolamide-Brimonidine (SIMBRINZA) 1-0.2 % SUSP Place 1 drop into both eyes 2 (two) times daily.    04/11/2018 at Unknown time  . Cholecalciferol (VITAMIN D3) 5000 units CAPS Take 1 capsule by mouth daily.    04/11/2018 at Unknown time  . esomeprazole (NEXIUM 24HR) 20 MG capsule Take 20 mg by mouth daily at 12 noon.    04/11/2018 at Unknown time  . furosemide (LASIX) 40 MG tablet Take 40 mg by mouth 2 (two) times daily. In the morning and at 2P   04/11/2018 at 1400  . gabapentin (NEURONTIN) 300 MG capsule Take 300 mg at bedtime by mouth.   04/10/2018 at Unknown time  . latanoprost (XALATAN) 0.005 % ophthalmic solution Place 1 drop into both eyes at bedtime.     04/10/2018 at Unknown time  . loratadine (CLARITIN) 10 MG tablet Take 10 mg by mouth as needed for allergies.   04/11/2018 at Unknown time  . meclizine (ANTIVERT) 12.5 MG tablet Take 12.5 mg by mouth 3 (three) times daily as needed for dizziness.   04/11/2018 at Unknown time  . metoprolol tartrate (LOPRESSOR) 25 MG tablet Take 0.5 tablets (12.5 mg total) by mouth 2 (two) times daily. 90 tablet 3 04/11/2018 at Unknown time  . neomycin-polymyxin-hydrocortisone (CORTISPORIN) 3.5-10000-1 otic suspension Place 2 drops 4 (four) times daily as needed into both ears.    unknown  . ondansetron (ZOFRAN-ODT) 4 MG disintegrating tablet TAKE ONE Tab BY MOUTH 4-6 hours AS NEEDED for nausea  1 04/11/2018 at Unknown time  . thyroid (ARMOUR) 30 MG  tablet Take 30 mg by mouth daily before breakfast.   04/11/2018 at Unknown time  . traMADol (ULTRAM) 50 MG tablet Take 1 tablet (50 mg total) every 6 (six) hours as needed by mouth for moderate pain. For pain 90 tablet 0 Past Month at Unknown time  . warfarin (COUMADIN) 2 MG tablet Take 3 mg by mouth every other day.    04/11/2018 at [place]  . traZODone (DESYREL) 100 MG tablet Take 1 tablet (100 mg total) at bedtime as needed by mouth for sleep. (Patient not taking: Reported on 04/11/2018)   Not Taking at Unknown time   Assessment: Okay for Protocol, no bleeding noted.  History sensivity to warfarin therapy.  Currently receiving doses every other day. INR below goal.  Goal of Therapy:  INR 2-3   Plan:  Warfarin 1mg  PO x 1 today. Monitor for signs and symptoms of bleeding.  Daily PT/INR.  Pricilla Larsson 04/12/2018,9:24 AM

## 2018-04-12 NOTE — Progress Notes (Signed)
PROGRESS NOTE    [name]  UEA:[id number] DOB: May 17, 1941 DOA: 04/11/2018 PCP: Doree Albee, MD    Brief Narrative:  77 year old female admitted to the hospital with decompensated CHF.  Family reports that she was seen by her primary care physician who had increased her dosing of Lasix without significant improvement.  She is been admitted for intravenous diuresis.  Also noted to have rapid atrial fibrillation on arrival, since converted to sinus rhythm.   Assessment & Plan:   Principal Problem:   Acute on chronic systolic heart failure (HCC) Active Problems:   MENTAL RETARDATION   Chronic anticoagulation   Chronic kidney disease, stage 3 (HCC)   Atrial fibrillation with RVR (HCC)   1. Acute CHF.  Echo from 09/2017 showed normal ejection fraction.  Repeat echocardiogram has been performed with report currently pending.  She does have signs of volume overload.  She is been started on intravenous Lasix.  Urine output difficult to accurately record since she is had several incontinent accidents.  Urinary catheter will be placed for accurate intake and output.  We discussed her overall fluid intake as well as salt intake.  She may be taking excess amounts of fluid.  Will request dietitian consultation to discuss appropriate dietary restrictions.  Continue on beta-blockers. 2. Paroxysmal atrial fibrillation.  Initially presented with rapid atrial fibrillation.  She has since converted back to sinus rhythm.  Briefly required Cardizem infusion, but now on oral Lopressor.  Anticoagulated with Coumadin. 3. Chronic kidney disease stage III.  Patient presents with a creatinine of 1.4.  This is been stable with diuresis.  Continue to monitor. 4. Hypothyroidism.  Continue on thyroid replacement   DVT prophylaxis: Coumadin Code Status: Full code Family Communication: Discussed with niece at the bedside Disposition Plan: Discharge home once improved   Consultants:     Procedures:      Antimicrobials:      Subjective: Appears to be doing better today.  Shortness of breath improving.  Less agitated this morning.  Objective: Vitals:   04/12/18 1200 04/12/18 1300 04/12/18 1400 04/12/18 1500  BP: 117/72 109/78 (!) 114/92 117/80  Pulse: 83 70 78 80  Resp:  12 14 14   Temp:  98 F (36.7 C)    TempSrc:  Oral    SpO2: 100% 100% (!) 79% 100%  Weight:      Height:        Intake/Output Summary (Last 24 hours) at 04/12/2018 1533 Last data filed at 04/12/2018 1200 Gross per 24 hour  Intake 934.17 ml  Output 550 ml  Net 384.17 ml   Filed Weights   04/11/18 1551 04/11/18 2116 04/12/18 0500  Weight: 91.6 kg (202 lb) 91.2 kg (201 lb 1 oz) 91.2 kg (201 lb 1 oz)    Examination:  General exam: Appears calm and comfortable  Respiratory system: Crackles at bases. Respiratory effort normal. Cardiovascular system: S1 & S2 heard, RRR. No JVD, murmurs, rubs, gallops or clicks. 1+ pedal edema. Gastrointestinal system: Abdomen is nondistended, soft and nontender. No organomegaly or masses felt. Normal bowel sounds heard. Central nervous system: . No focal neurological deficits. Extremities: Symmetric 5 x 5 power. Skin: No rashes, lesions or ulcers Psychiatry: Answers yes and no questions.     Data Reviewed: I have personally reviewed following labs and imaging studies  CBC: Recent Labs  Lab 04/11/18 1614  WBC 5.4  NEUTROABS 3.6  HGB 9.2*  HCT 30.1*  MCV 84.1  PLT 478   Basic Metabolic  Panel: Recent Labs  Lab 04/11/18 1614 04/12/18 0108 04/12/18 0709  NA 141  --  141  K 3.8  --  3.6  CL 98*  --  97*  CO2 32  --  34*  GLUCOSE 107*  --  95  BUN 13  --  12  CREATININE 1.49*  --  1.44*  CALCIUM 8.8*  --  8.6*  MG  --  1.4*  --    GFR: Estimated Creatinine Clearance: 39.2 mL/min (A) (by C-G formula based on SCr of 1.44 mg/dL (H)). Liver Function Tests: Recent Labs  Lab 04/11/18 1614  AST 16  ALT 8*  ALKPHOS 88  BILITOT 1.0  PROT 6.8   ALBUMIN 3.3*   No results for input(s): LIPASE, AMYLASE in the last 168 hours. No results for input(s): AMMONIA in the last 168 hours. Coagulation Profile: Recent Labs  Lab 04/11/18 1614 04/12/18 0709  INR 1.47 1.63   Cardiac Enzymes: Recent Labs  Lab 04/11/18 1614 04/11/18 1922 04/12/18 0108 04/12/18 0709  TROPONINI <0.03 <0.03 <0.03 <0.03   BNP (last 3 results) No results for input(s): PROBNP in the last 8760 hours. HbA1C: No results for input(s): HGBA1C in the last 72 hours. CBG: No results for input(s): GLUCAP in the last 168 hours. Lipid Profile: No results for input(s): CHOL, HDL, LDLCALC, TRIG, CHOLHDL, LDLDIRECT in the last 72 hours. Thyroid Function Tests: No results for input(s): TSH, T4TOTAL, FREET4, T3FREE, THYROIDAB in the last 72 hours. Anemia Panel: No results for input(s): VITAMINB12, FOLATE, FERRITIN, TIBC, IRON, RETICCTPCT in the last 72 hours. Sepsis Labs: No results for input(s): PROCALCITON, LATICACIDVEN in the last 168 hours.  Recent Results (from the past 240 hour(s))  Urine culture     Status: Abnormal (Preliminary result)   Collection Time: 04/11/18  4:02 PM  Result Value Ref Range Status   Specimen Description   Final    URINE, RANDOM Performed at [facility], [address] [place], [place] [address]    Special Requests   Final    NONE Performed at [facility], [address] [place], [place] [address]    Culture >=100,000 COLONIES/mL ENTEROCOCCUS FAECIUM (A)  Final   Report Status PENDING  Incomplete  MRSA PCR Screening     Status: None   Collection Time: 04/11/18  9:15 PM  Result Value Ref Range Status   MRSA by PCR NEGATIVE NEGATIVE Final    Comment:        The GeneXpert MRSA Assay (FDA approved for NASAL specimens only), is one component of a comprehensive MRSA colonization surveillance program. It is not intended to diagnose MRSA infection nor to guide or monitor treatment for MRSA infections. Performed at [facility], [address], [place], [place] [address]          Radiology Studies: Dg Chest 2 View  Result Date: 04/11/2018 CLINICAL DATA:  Abdominal swelling.  History of CHF. EXAM: CHEST - 2 VIEW COMPARISON:  September 12, 2017 FINDINGS: Cardiomegaly. Mild edema. Small bilateral effusions with underlying atelectasis. IMPRESSION: Cardiomegaly, small effusions, and mild edema. Electronically Signed   By: Dorise Bullion III M.D   On: 04/11/2018 16:59   US Abdomen Complete  Result Date: 04/11/2018 CLINICAL DATA:  Abdominal swelling EXAM: ABDOMEN ULTRASOUND COMPLETE COMPARISON:  Ultrasound 09/17/2017 FINDINGS: Gallbladder: Contracted gallbladder with gallstones measuring up to 8 mm. Gallbladder wall 3 mm Common bile duct: Diameter: 4 mm Liver: Increased echogenicity of the liver without focal lesion. Portal vein is patent on  color Doppler imaging with normal direction of blood flow towards the liver. IVC: No abnormality visualized. Pancreas: Limited due to bowel gas Spleen: Small spleen 3.6 mm with accessory spleen 14 mm Right Kidney: Length: 8.3 cm. Echogenicity within normal limits. No mass or hydronephrosis visualized. Left Kidney: Length: . Not visualized. This may be due to bowel gas or absence of the kidney. Abdominal aorta: Not visualized due to bowel gas Other findings: No free fluid IMPRESSION: Contracted gallbladder with gallstones.  Common bile duct 4 mm. Left kidney not visualized possibly due to absence or diminished visualization due to bowel gas. Exam detail significantly degraded by patient size and bowel gas. Electronically Signed   By: Franchot Gallo M.D.   On: 04/11/2018 16:55        Scheduled Meds: . allopurinol  100 mg Oral QPM  . cholecalciferol  5,000 Units Oral Daily  . furosemide  60 mg Intravenous Q12H  . gabapentin  300 mg Oral QHS  . latanoprost  1 drop Both Eyes QHS  . mouth rinse  15 mL Mouth Rinse BID  . metoprolol tartrate  12.5 mg Oral BID  . pantoprazole  40 mg Oral  Daily  . sodium chloride flush  3 mL Intravenous Q12H  . thyroid  30 mg Oral QAC breakfast  . warfarin  1 mg Oral Once  . [START ON 04/13/2018] Warfarin - Pharmacist Dosing Inpatient   Does not apply Q24H   Continuous Infusions: . sodium chloride    . diltiazem (CARDIZEM) infusion Stopped (04/12/18 0509)  . magnesium sulfate 1 - 4 g bolus IVPB       LOS: 1 day    Time spent: 63mins. Greater than 50% of this time spent in direct contact with patient and family discussing her lifestyle habits including salt and fluid intake, compliance of medications, further work-up pending in the hospital and anticipated treatments moving forward.    Kathie Dike, MD [facility] Pager [phone]  If 7PM-7AM, please contact night-coverage [email or web address] Password [facility] 04/12/2018, 3:33 PM

## 2018-04-12 NOTE — Plan of Care (Signed)
  Problem: Acute Rehab PT Goals(only PT should resolve) Goal: Pt Will Go Supine/Side To Sit Outcome: Progressing Flowsheets (Taken 04/12/2018 1339) Pt will go Supine/Side to Sit: with modified independence Goal: Patient Will Transfer Sit To/From Stand Outcome: Progressing Flowsheets (Taken 04/12/2018 1339) Patient will transfer sit to/from stand: with supervision Goal: Pt Will Transfer Bed To Chair/Chair To Bed Outcome: Progressing Flowsheets (Taken 04/12/2018 1339) Pt will Transfer Bed to Chair/Chair to Bed: with supervision Goal: Pt Will Ambulate Outcome: Progressing Flowsheets (Taken 04/12/2018 1339) Pt will Ambulate: 50 feet;with rolling walker;with supervision   1:40 PM, 04/12/18 Lonell Grandchild, MPT Physical Therapist with [facility] 336 [phone] office [phone] mobile phone

## 2018-04-13 LAB — BASIC METABOLIC PANEL
ANION GAP: 9 (ref 5–15)
BUN: 12 mg/dL (ref 6–20)
CO2: 36 mmol/L — AB (ref 22–32)
Calcium: 8.8 mg/dL — ABNORMAL LOW (ref 8.9–10.3)
Chloride: 95 mmol/L — ABNORMAL LOW (ref 101–111)
Creatinine, Ser: 1.38 mg/dL — ABNORMAL HIGH (ref 0.44–1.00)
GFR calc Af Amer: 42 mL/min — ABNORMAL LOW (ref >60)
GFR calc non Af Amer: 36 mL/min — ABNORMAL LOW (ref >60)
Glucose, Bld: 101 mg/dL — ABNORMAL HIGH (ref 65–99)
POTASSIUM: 3.7 mmol/L (ref 3.5–5.1)
Sodium: 140 mmol/L (ref 135–145)

## 2018-04-13 LAB — URINE CULTURE: Culture: >=100000 — AB

## 2018-04-13 LAB — PROTIME-INR
INR: 1.64
Prothrombin Time: 19.3 seconds — ABNORMAL HIGH (ref 11.4–15.2)

## 2018-04-13 MED ORDER — WARFARIN SODIUM 2 MG PO TABS
3.0000 mg | ORAL_TABLET | Freq: Once | ORAL | Status: AC
  Administered 2018-04-13: 3 mg via ORAL
  Filled 2018-04-13: qty 1

## 2018-04-13 MED ORDER — ALUM & MAG HYDROXIDE-SIMETH 200-200-20 MG/5ML PO SUSP
30.0000 mL | ORAL | Status: DC | PRN
Start: 2018-04-13 — End: 2018-04-15
  Administered 2018-04-13: 30 mL via ORAL
  Filled 2018-04-13: qty 30

## 2018-04-13 NOTE — Progress Notes (Signed)
[name] for Warfarin Indication: atrial fibrillation  No Known Allergies  Patient Measurements: Height: 5\' 8"  (172.7 cm) Weight: 201 lb 11.5 oz (91.5 kg) IBW/kg (Calculated) : 63.9 Heparin Dosing Weight:   Vital Signs: Temp: 97.3 F (36.3 C) (06/12 0831) Temp Source: Oral (06/12 0831) BP: 99/57 (06/12 0700) Pulse Rate: 72 (06/12 0700)  Labs: Recent Labs    04/11/18 1614 04/11/18 1922 04/12/18 0108 04/12/18 0709 04/13/18 0420  HGB 9.2*  --   --   --   --   HCT 30.1*  --   --   --   --   PLT 256  --   --   --   --   LABPROT 17.7*  --   --  19.2* 19.3*  INR 1.47  --   --  1.63 1.64  CREATININE 1.49*  --   --  1.44* 1.38*  TROPONINI <0.03 <0.03 <0.03 <0.03  --     Estimated Creatinine Clearance: 41 mL/min (A) (by C-G formula based on SCr of 1.38 mg/dL (H)).   Medical History: Past Medical History:  Diagnosis Date  . Adenomatous polyps    -resected via colonoscopy in 2011  . Anemia   . Anxiety    MR  . Atrial fibrillation (HCC)    EF of 45%  . Degenerative joint disease    status post right total hip arthroplasty  . Dysrhythmia   . GERD (gastroesophageal reflux disease)    -Barrett's esophagus  . Glaucoma   . Glaucoma   . Gout   . Hyperlipidemia    Recent lipid profile is excellent without lipid-lowering therapy  . Hypertension   . Mental retardation   . Premature ventricular contractions   . Restless leg syndrome   . Shortness of breath dyspnea    Medications:  Medications Prior to Admission  Medication Sig Dispense Refill Last Dose  . allopurinol (ZYLOPRIM) 100 MG tablet Take 100 mg every evening by mouth.    04/10/2018 at Unknown time  . Brinzolamide-Brimonidine (SIMBRINZA) 1-0.2 % SUSP Place 1 drop into both eyes 2 (two) times daily.    04/11/2018 at Unknown time  . Cholecalciferol (VITAMIN D3) 5000 units CAPS Take 1 capsule by mouth daily.    04/11/2018 at Unknown time  . esomeprazole (NEXIUM 24HR) 20 MG  capsule Take 20 mg by mouth daily at 12 noon.   04/11/2018 at Unknown time  . furosemide (LASIX) 40 MG tablet Take 40 mg by mouth 2 (two) times daily. In the morning and at 2P   04/11/2018 at 1400  . gabapentin (NEURONTIN) 300 MG capsule Take 300 mg at bedtime by mouth.   04/10/2018 at Unknown time  . latanoprost (XALATAN) 0.005 % ophthalmic solution Place 1 drop into both eyes at bedtime.     04/10/2018 at Unknown time  . loratadine (CLARITIN) 10 MG tablet Take 10 mg by mouth as needed for allergies.   04/11/2018 at Unknown time  . meclizine (ANTIVERT) 12.5 MG tablet Take 12.5 mg by mouth 3 (three) times daily as needed for dizziness.   04/11/2018 at Unknown time  . metoprolol tartrate (LOPRESSOR) 25 MG tablet Take 0.5 tablets (12.5 mg total) by mouth 2 (two) times daily. 90 tablet 3 04/11/2018 at Unknown time  . neomycin-polymyxin-hydrocortisone (CORTISPORIN) 3.5-10000-1 otic suspension Place 2 drops 4 (four) times daily as needed into both ears.    unknown  . ondansetron (ZOFRAN-ODT) 4 MG disintegrating tablet TAKE ONE Tab BY MOUTH 4-6  hours AS NEEDED for nausea  1 04/11/2018 at Unknown time  . thyroid (ARMOUR) 30 MG tablet Take 30 mg by mouth daily before breakfast.   04/11/2018 at Unknown time  . traMADol (ULTRAM) 50 MG tablet Take 1 tablet (50 mg total) every 6 (six) hours as needed by mouth for moderate pain. For pain 90 tablet 0 Past Month at Unknown time  . warfarin (COUMADIN) 3 MG tablet Take 3 mg by mouth See admin instructions. Take one tablet every other day.   04/11/2018 at [place]  . traZODone (DESYREL) 100 MG tablet Take 1 tablet (100 mg total) at bedtime as needed by mouth for sleep. (Patient not taking: Reported on 04/11/2018)   Not Taking at Unknown time   Assessment: Okay for Protocol, no bleeding noted.  History sensivity to warfarin therapy.  Currently receiving doses every other day. INR below goal.  Goal of Therapy:  INR 2-3   Plan:  Warfarin 3mg  PO x 1 today. Monitor for signs and  symptoms of bleeding.  Daily PT/INR.  Revonda Standard Ilanna Deihl 04/13/2018,8:40 AM

## 2018-04-13 NOTE — Evaluation (Signed)
Clinical/Bedside Swallow Evaluation Patient Details  Name: [name] MRN: [id number] Date of Birth: September 12, 1941  Today's Date: 04/13/2018 Time: SLP Start Time (ACUTE ONLY): 1435 SLP Stop Time (ACUTE ONLY): 1500 SLP Time Calculation (min) (ACUTE ONLY): 25 min  Past Medical History:  Past Medical History:  Diagnosis Date  . Adenomatous polyps    -resected via colonoscopy in 2011  . Anemia   . Anxiety    MR  . Atrial fibrillation (HCC)    EF of 45%  . Degenerative joint disease    status post right total hip arthroplasty  . Dysrhythmia   . GERD (gastroesophageal reflux disease)    -Barrett's esophagus  . Glaucoma   . Glaucoma   . Gout   . Hyperlipidemia    Recent lipid profile is excellent without lipid-lowering therapy  . Hypertension   . Mental retardation   . Premature ventricular contractions   . Restless leg syndrome   . Shortness of breath dyspnea    Past Surgical History:  Past Surgical History:  Procedure Laterality Date  . CATARACT EXTRACTION  2006   left eye  . COLONOSCOPY W/ POLYPECTOMY  2011  . ESOPHAGOGASTRODUODENOSCOPY N/A 01/01/2016   Procedure: ESOPHAGOGASTRODUODENOSCOPY (EGD);  Surgeon: Rogene Houston, MD;  [facility];  Service: Endoscopy;  Laterality: N/A;  200  . EYE SURGERY    . JOINT REPLACEMENT     x2  . RADIOLOGY WITH ANESTHESIA Right 11/21/2015   Procedure: MRI OF RIGHT KNEE WITHOUT CONTAST    (RADIOLOGY WITH ANESTHESIA);  Surgeon: Medication Radiologist, MD;  Location: [place];  Service: Radiology;  Laterality: Right;  . TOTAL HIP ARTHROPLASTY  2006   Right   HPI:  Laura Trotter Gammonis a 77 y.o.femalewith medical history significant ofmental disability, anxiety, A. fib, congestive heart failure, lives alone in the backyard in a trailer close to her sisters. All of the history is obtained from her sisters who are her primary caregivers. She does not really comply with salt intake due to her intellectual impairment. She has been  swelling in her legs all the way up into her thighs and her abdomen for several weeks now. Her Lasix was recently increased from 40 mg daily to twice a day this past week and it is not really helping her a lot. She has been urinating a lot but her swelling is not any better. Patient does not understand that she has to raise her legs and keep them raised she likes to hang her legs off the bed all day long. She has not had any fevers. No nausea vomiting or diarrhea. Today the sisters noticed that she was more short of breath with walking and she normally is. Patient referred for admission for worsening heart failure requiring usage of IV diuretics. Patient has agitated and upset that she is in her room in the stepdown with no windows and no light shining through the windows. Her mental capabilities are about that of a second grader per her sisters. Chest x-ray shows: Cardiomegaly, small effusions, and mild edema. BSE ordered.   Assessment / Plan / Recommendation Clinical Impression  Clinical swallow evaluation completed at bedside. Pt had chips and drinks on her tray and was self feeding prior to SLP arrival. Pt reports h/o esophageal phase dysphagia for which she is followed by Dr. Laural Golden (had EGD in 2017 it appears). Pt endorses occasional esophageal symptoms ("things come back up"), however reports good tolerance at this time. Pt shows no overt signs of  symptoms of aspiration with consistencies and textures presented, although mild prolonged oral phase likely due to edentulous status. Pt also noted to talk with food in her mouth and she was encouraged to refrain from talking while eating. Pt would likely do better with mechanical soft textures given some impulsivity and edentulous status, however she stated that she does not want her diet altered. Continue diet as ordered. SLP will sign off at this time.   SLP Visit Diagnosis: Dysphagia, unspecified (R13.10)    Aspiration Risk  Mild aspiration  risk    Diet Recommendation Dysphagia 3 (Mech soft);Regular;Thin liquid(Pt likely won't regulate)   Liquid Administration via: Cup;Straw Medication Administration: Whole meds with liquid Supervision: Patient able to self feed;Intermittent supervision to cue for compensatory strategies Compensations: Minimize environmental distractions Postural Changes: Seated upright at 90 degrees;Remain upright for at least 30 minutes after po intake    Other  Recommendations Oral Care Recommendations: Oral care BID;Staff/trained caregiver to provide oral care Other Recommendations: Clarify dietary restrictions   Follow up Recommendations None      Frequency and Duration            Prognosis Prognosis for Safe Diet Advancement: Good Barriers to Reach Goals: Cognitive deficits      Swallow Study   General Date of Onset: 04/11/18 HPI: Laura Leisner Gammonis a 77 y.o.femalewith medical history significant ofmental disability, anxiety, A. fib, congestive heart failure, lives alone in the backyard in a trailer close to her sisters. All of the history is obtained from her sisters who are her primary caregivers. She does not really comply with salt intake due to her intellectual impairment. She has been swelling in her legs all the way up into her thighs and her abdomen for several weeks now. Her Lasix was recently increased from 40 mg daily to twice a day this past week and it is not really helping her a lot. She has been urinating a lot but her swelling is not any better. Patient does not understand that she has to raise her legs and keep them raised she likes to hang her legs off the bed all day long. She has not had any fevers. No nausea vomiting or diarrhea. Today the sisters noticed that she was more short of breath with walking and she normally is. Patient referred for admission for worsening heart failure requiring usage of IV diuretics. Patient has agitated and upset that she is in her room in  the stepdown with no windows and no light shining through the windows. Her mental capabilities are about that of a second grader per her sisters. Chest x-ray shows: Cardiomegaly, small effusions, and mild edema. BSE ordered. Type of Study: Bedside Swallow Evaluation Previous Swallow Assessment: EGD 2017 [place] Diet Prior to this Study: Regular;Thin liquids Temperature Spikes Noted: No Respiratory Status: Nasal cannula History of Recent Intubation: No Behavior/Cognition: Alert;Cooperative;Pleasant mood Oral Cavity Assessment: Within Functional Limits Oral Care Completed by SLP: No Oral Cavity - Dentition: Edentulous Vision: Functional for self-feeding Self-Feeding Abilities: Able to feed self Patient Positioning: Upright in bed Baseline Vocal Quality: Normal(harsh quality with inappropriate loudness) Volitional Cough: Strong Volitional Swallow: Able to elicit    Oral/Motor/Sensory Function Overall Oral Motor/Sensory Function: Within functional limits   Ice Chips Ice chips: Within functional limits Presentation: Spoon   Thin Liquid Thin Liquid: Within functional limits Presentation: Cup;Self Fed;Straw    Nectar Thick Nectar Thick Liquid: Not tested   Honey Thick Honey Thick Liquid: Not tested   Puree Puree: Not tested  Solid   GO   Solid: Impaired Presentation: Self Fed Oral Phase Impairments: Impaired mastication Oral Phase Functional Implications: Prolonged oral transit Other Comments: Pt talking with food in mouth       Thank you,  Genene Churn, [place]  Nickholas Goldston 04/13/2018,3:07 PM

## 2018-04-13 NOTE — Care Management Important Message (Signed)
Important Message  Patient Details  Name: [name] MRN: [id number] Date of Birth: 31-Aug-1941   Medicare Important Message Given:  Yes    Shelda Altes 04/13/2018, 11:57 AM

## 2018-04-13 NOTE — Progress Notes (Signed)
Physical Therapy Treatment Patient Details Name: [name] MRN: [id number] DOB: Sep 05, 1941 Today's Date: 04/13/2018    History of Present Illness [name] is a 77 y.o. female with medical history significant of mental disability, anxiety, A. fib, congestive heart failure, lives alone in the backyard in a trailer close to her sisters.  All of the history is obtained from her sisters who are her primary caregivers.  She does not really comply with salt intake due to her mental disabilities.  She has been swelling in her legs all the way up into her thighs and her abdomen for several weeks now.  Her Lasix was recently increased from 40 mg daily to twice a day this past week and it is not really helping her a lot.  She has been urinating a lot but her swelling is not any better.  Patient does not understand that she has to raise her legs and keep them raised she likes to hang her legs off the bed all day long.  She has not had any fevers.  No nausea vomiting or diarrhea.  Today the sisters noticed that she was more short of breath with walking and she normally is.  Patient referred for admission for worsening heart failure requiring usage of IV diuretics.  Patient has agitated and upset that she is in her room in the stepdown with no windows and no light shining through the windows.  Her mental capabilities about that of a second grader her sisters.    PT Comments    Patient demonstrates increased endurance/distance for ambulation, but limited secondary to fatigue and requires additional assistance for last 10--15 steps to make it back to bedside, once fatigued patient becomes anxious and yells for family member.  Patient put back to bed after therapy to be moved to room 317.  Patient will benefit from continued physical therapy in hospital and recommended venue below to increase strength, balance, endurance for safe ADLs and gait.    Follow Up Recommendations  Home health PT;Supervision for  mobility/OOB     Equipment Recommendations  None recommended by PT    Recommendations for Other Services       Precautions / Restrictions Precautions Precautions: Fall Restrictions Weight Bearing Restrictions: No    Mobility  Bed Mobility Overal bed mobility: Needs Assistance Bed Mobility: Supine to Sit;Sit to Supine     Supine to sit: Supervision Sit to supine: Supervision   General bed mobility comments: slow slightly labored movement  Transfers Overall transfer level: Needs assistance Equipment used: Rolling walker (2 wheeled) Transfers: Sit to/from [facility] Sit to Stand: Min guard Stand pivot transfers: Min guard          Ambulation/Gait Ambulation/Gait assistance: Min guard;Min assist Ambulation Distance (Feet): 30 Feet Assistive device: Rolling walker (2 wheeled) Gait Pattern/deviations: Decreased step length - right;Decreased step length - left;Decreased stride length Gait velocity: slow   General Gait Details: demonstrates increased endurance/distance for gait traiing with slow labored cadence, once fatigued required more assistance to make it back to bed   Stairs             Wheelchair Mobility    Modified Rankin (Stroke Patients Only)       Balance Overall balance assessment: Needs assistance Sitting-balance support: Feet supported;No upper extremity supported Sitting balance-Leahy Scale: Good     Standing balance support: Bilateral upper extremity supported;During functional activity Standing balance-Leahy Scale: Fair  Cognition Arousal/Alertness: Awake/alert Behavior During Therapy: WFL for tasks assessed/performed Overall Cognitive Status: History of cognitive impairments - at baseline                                 General Comments: very cooperative with family member present in room      Exercises General Exercises - Lower Extremity Ankle  Circles/Pumps: Seated;AROM;Strengthening;Both;10 reps Long Arc Quad: Seated;AROM;Strengthening;Both;10 reps Hip Flexion/Marching: Seated;AROM;Strengthening;Both;10 reps    General Comments        Pertinent Vitals/Pain Pain Assessment: No/denies pain    Home Living                      Prior Function            PT Goals (current goals can now be found in the care plan section) Acute Rehab PT Goals Patient Stated Goal: return home with family to assist PT Goal Formulation: With patient Time For Goal Achievement: 04/19/18 Potential to Achieve Goals: Good Progress towards PT goals: Progressing toward goals    Frequency    Min 3X/week      PT Plan Current plan remains appropriate    Co-evaluation              AM-PAC PT "6 Clicks" Daily Activity  Outcome Measure  Difficulty turning over in bed (including adjusting bedclothes, sheets and blankets)?: None Difficulty moving from lying on back to sitting on the side of the bed? : None Difficulty sitting down on and standing up from a chair with arms (e.g., wheelchair, bedside commode, etc,.)?: A Little Help needed moving to and from a bed to chair (including a wheelchair)?: A Little Help needed walking in hospital room?: A Little Help needed climbing 3-5 steps with a railing? : A Lot 6 Click Score: 19    End of Session   Activity Tolerance: Patient tolerated treatment well;Patient limited by fatigue Patient left: in bed;with call bell/phone within reach;with family/visitor present Nurse Communication: Mobility status PT Visit Diagnosis: Unsteadiness on feet (R26.81);Other abnormalities of gait and mobility (R26.89);Muscle weakness (generalized) (M62.81)     Time: 0722-5750 PT Time Calculation (min) (ACUTE ONLY): 24 min  Charges:  $Therapeutic Exercise: 8-22 mins $Therapeutic Activity: 8-22 mins                    G Codes:       2:00 PM, 26-Apr-2018 Lonell Grandchild, MPT Physical Therapist with  [facility] [phone] office [phone] mobile phone

## 2018-04-13 NOTE — Progress Notes (Signed)
PROGRESS NOTE    [name]  ZSW:[id number] DOB: [birth year] DOA: 04/11/2018 PCP: Doree Albee, MD    Brief Narrative:  77 year old female admitted to the hospital with decompensated CHF.  Family reports that she was seen by her primary care physician who had increased her dosing of Lasix without significant improvement.  She is been admitted for intravenous diuresis.  Also noted to have rapid atrial fibrillation on arrival, since converted to sinus rhythm.   Assessment & Plan:   Principal Problem:   Acute on chronic diastolic CHF (congestive heart failure) (HCC) Active Problems:   MENTAL RETARDATION   Chronic anticoagulation   Chronic kidney disease, stage 3 (HCC)   Atrial fibrillation with RVR (HCC)   1. Acute diastolic CHF.  Echo shows normal ejection fraction with diastolic dysfunction.  She continues to have signs of volume overload.  Continues to have fair diuresis with IV Lasix.  Continue current treatments.  Continue on beta-blockers. 2. Paroxysmal atrial fibrillation.  Initially presented with rapid atrial fibrillation.  She has since converted back to sinus rhythm.  Briefly required Cardizem infusion, but now on oral Lopressor.  Anticoagulated with Coumadin. 3. Chronic kidney disease stage III.  Patient presents with a creatinine of 1.4.  This has been stable with diuresis.  Continue to monitor. 4. Hypothyroidism.  Continue on thyroid replacement   DVT prophylaxis: Coumadin Code Status: Full code Family Communication: Discussed with niece at the bedside Disposition Plan: Discharge home once improved   Consultants:     Procedures:     Antimicrobials:      Subjective: Feeling better. Shortness of breath improving. Wants to go home  Objective: Vitals:   04/13/18 0800 04/13/18 0831 04/13/18 1352 04/13/18 1430  BP: 119/67  103/69 94/61  Pulse: 72  85 (!) 54  Resp: 13  18   Temp:  (!) 97.3 F (36.3 C) 97.8 F (36.6 C) (!) 97.5 F (36.4 C)    TempSrc:  Oral Oral Oral  SpO2: 100%  91% (!) 84%  Weight:      Height:        Intake/Output Summary (Last 24 hours) at 04/13/2018 1843 Last data filed at 04/13/2018 1743 Gross per 24 hour  Intake 720 ml  Output 3350 ml  Net -2630 ml   Filed Weights   04/11/18 2116 04/12/18 0500 04/13/18 0444  Weight: 91.2 kg (201 lb 1 oz) 91.2 kg (201 lb 1 oz) 91.5 kg (201 lb 11.5 oz)    Examination:  General exam: Alert, awake, oriented x 3 Respiratory system: Crackles at bases. Respiratory effort normal. Cardiovascular system:RRR. No murmurs, rubs, gallops. Gastrointestinal system: Abdomen is nondistended, soft and nontender. No organomegaly or masses felt. Normal bowel sounds heard. Central nervous system: Alert and oriented. No focal neurological deficits. Extremities: 1+ edema Skin: No rashes, lesions or ulcers Psychiatry: difficult to comprehend speech      Data Reviewed: I have personally reviewed following labs and imaging studies  CBC: Recent Labs  Lab 04/11/18 1614  WBC 5.4  NEUTROABS 3.6  HGB 9.2*  HCT 30.1*  MCV 84.1  PLT 322   Basic Metabolic Panel: Recent Labs  Lab 04/11/18 1614 04/12/18 0108 04/12/18 0709 04/13/18 0420  NA 141  --  141 140  K 3.8  --  3.6 3.7  CL 98*  --  97* 95*  CO2 32  --  34* 36*  GLUCOSE 107*  --  95 101*  BUN 13  --  12 12  CREATININE  1.49*  --  1.44* 1.38*  CALCIUM 8.8*  --  8.6* 8.8*  MG  --  1.4*  --   --    GFR: Estimated Creatinine Clearance: 41 mL/min (A) (by C-G formula based on SCr of 1.38 mg/dL (H)). Liver Function Tests: Recent Labs  Lab 04/11/18 1614  AST 16  ALT 8*  ALKPHOS 88  BILITOT 1.0  PROT 6.8  ALBUMIN 3.3*   No results for input(s): LIPASE, AMYLASE in the last 168 hours. No results for input(s): AMMONIA in the last 168 hours. Coagulation Profile: Recent Labs  Lab 04/11/18 1614 04/12/18 0709 04/13/18 0420  INR 1.47 1.63 1.64   Cardiac Enzymes: Recent Labs  Lab 04/11/18 1614 04/11/18 1922  04/12/18 0108 04/12/18 0709  TROPONINI <0.03 <0.03 <0.03 <0.03   BNP (last 3 results) No results for input(s): PROBNP in the last 8760 hours. HbA1C: No results for input(s): HGBA1C in the last 72 hours. CBG: No results for input(s): GLUCAP in the last 168 hours. Lipid Profile: No results for input(s): CHOL, HDL, LDLCALC, TRIG, CHOLHDL, LDLDIRECT in the last 72 hours. Thyroid Function Tests: No results for input(s): TSH, T4TOTAL, FREET4, T3FREE, THYROIDAB in the last 72 hours. Anemia Panel: No results for input(s): VITAMINB12, FOLATE, FERRITIN, TIBC, IRON, RETICCTPCT in the last 72 hours. Sepsis Labs: No results for input(s): PROCALCITON, LATICACIDVEN in the last 168 hours.  Recent Results (from the past 240 hour(s))  Urine culture     Status: Abnormal   Collection Time: 04/11/18  4:02 PM  Result Value Ref Range Status   Specimen Description   Final    URINE, RANDOM Performed at [facility], [address] [place], [place] [address]    Special Requests   Final    NONE Performed at [facility], [address] [place], [place] [address]    Culture >=100,000 COLONIES/mL ENTEROCOCCUS FAECIUM (A)  Final   Report Status 04/13/2018 FINAL  Final   Organism ID, Bacteria ENTEROCOCCUS FAECIUM (A)  Final      Susceptibility   Enterococcus faecium - MIC*    AMPICILLIN >=32 RESISTANT Resistant     LEVOFLOXACIN >=8 RESISTANT Resistant     NITROFURANTOIN 256 RESISTANT Resistant     VANCOMYCIN <=0.5 SENSITIVE Sensitive     * >=100,000 COLONIES/mL ENTEROCOCCUS FAECIUM  MRSA PCR Screening     Status: None   Collection Time: 04/11/18  9:15 PM  Result Value Ref Range Status   MRSA by PCR NEGATIVE NEGATIVE Final    Comment:        The GeneXpert MRSA Assay (FDA approved for NASAL specimens only), is one component of a comprehensive MRSA colonization surveillance program. It is not intended to diagnose MRSA infection nor to guide or monitor treatment for MRSA infections. Performed at  [facility], [address], [place], [place] [address]          Radiology Studies: No results found.      Scheduled Meds: . allopurinol  100 mg Oral QPM  . cholecalciferol  5,000 Units Oral Daily  . furosemide  60 mg Intravenous Q12H  . gabapentin  300 mg Oral QHS  . latanoprost  1 drop Both Eyes QHS  . mouth rinse  15 mL Mouth Rinse BID  . metoprolol tartrate  12.5 mg Oral BID  . pantoprazole  40 mg Oral Daily  . sodium chloride flush  3 mL Intravenous Q12H  . thyroid  30 mg Oral QAC breakfast  . Warfarin - Pharmacist Dosing  Inpatient   Does not apply Q24H   Continuous Infusions: . sodium chloride    . diltiazem (CARDIZEM) infusion Stopped (04/12/18 0509)     LOS: 2 days    Time spent: 54mins    Kathie Dike, MD [facility] Pager [phone]  If 7PM-7AM, please contact night-coverage [email or web address] Password [facility] 04/13/2018, 6:43 PM

## 2018-04-14 LAB — BASIC METABOLIC PANEL
Anion gap: 10 (ref 5–15)
BUN: 11 mg/dL (ref 6–20)
CHLORIDE: 93 mmol/L — AB (ref 101–111)
CO2: 37 mmol/L — AB (ref 22–32)
CREATININE: 1.49 mg/dL — AB (ref 0.44–1.00)
Calcium: 8.8 mg/dL — ABNORMAL LOW (ref 8.9–10.3)
GFR calc non Af Amer: 33 mL/min — ABNORMAL LOW (ref >60)
GFR, EST AFRICAN AMERICAN: 38 mL/min — AB (ref >60)
Glucose, Bld: 104 mg/dL — ABNORMAL HIGH (ref 65–99)
Potassium: 3.1 mmol/L — ABNORMAL LOW (ref 3.5–5.1)
Sodium: 140 mmol/L (ref 135–145)

## 2018-04-14 LAB — MAGNESIUM: Magnesium: 1.7 mg/dL (ref 1.7–2.4)

## 2018-04-14 LAB — PROTIME-INR
INR: 1.51
Prothrombin Time: 18.1 seconds — ABNORMAL HIGH (ref 11.4–15.2)

## 2018-04-14 MED ORDER — WARFARIN SODIUM 2 MG PO TABS
4.0000 mg | ORAL_TABLET | Freq: Once | ORAL | Status: AC
  Administered 2018-04-14: 4 mg via ORAL
  Filled 2018-04-14: qty 2

## 2018-04-14 MED ORDER — POTASSIUM CHLORIDE CRYS ER 20 MEQ PO TBCR
40.0000 meq | EXTENDED_RELEASE_TABLET | ORAL | Status: AC
  Administered 2018-04-14 (×2): 40 meq via ORAL
  Filled 2018-04-14 (×2): qty 2

## 2018-04-14 NOTE — Progress Notes (Signed)
PROGRESS NOTE    [name]  NAT:[id number] DOB: [birth year] DOA: 04/11/2018 PCP: Doree Albee, MD    Brief Narrative:  77 year old female admitted to the hospital with decompensated CHF.  Family reports that she was seen by her primary care physician who had increased her dosing of Lasix without significant improvement.  She is been admitted for intravenous diuresis.  Also noted to have rapid atrial fibrillation on arrival, since converted to sinus rhythm.   Assessment & Plan:   Principal Problem:   Acute on chronic diastolic CHF (congestive heart failure) (HCC) Active Problems:   MENTAL RETARDATION   Chronic anticoagulation   Chronic kidney disease, stage 3 (HCC)   Atrial fibrillation with RVR (HCC)   1. Acute diastolic CHF.  Echo shows normal ejection fraction with diastolic dysfunction.  Although volume status is improving, still has some evidence of volume overload.  Continue current dose of IV Lasix since she has fair urine output.  Continue current treatments.  Continue on beta-blockers. 2. Paroxysmal atrial fibrillation.  Initially presented with rapid atrial fibrillation.  She has since converted back to sinus rhythm.  Briefly required Cardizem infusion, but now on oral Lopressor.  Anticoagulated with Coumadin. 3. Chronic kidney disease stage III.  Patient presents with a creatinine of 1.4.  This has been stable with diuresis.  Continue to monitor. 4. Hypothyroidism.  Continue on thyroid replacement   DVT prophylaxis: Coumadin Code Status: Full code Family Communication: No family present today Disposition Plan: Discharge home once improved   Consultants:     Procedures:     Antimicrobials:      Subjective: Feels that breathing is improving.  No chest pain.  Wants to go home.  Objective: Vitals:   04/13/18 2107 04/14/18 0500 04/14/18 1302 04/14/18 1438  BP: (!) 103/55 114/68 (!) 93/43   Pulse: 82 80 78   Resp: 16 17 18    Temp: 97.6 F (36.4 C)  98.2 F (36.8 C) 97.9 F (36.6 C)   TempSrc: Oral Oral Oral   SpO2: 92% 96% 100% 96%  Weight:  93.5 kg (206 lb 2.1 oz)    Height:        Intake/Output Summary (Last 24 hours) at 04/14/2018 1655 Last data filed at 04/14/2018 1300 Gross per 24 hour  Intake 1200 ml  Output 2700 ml  Net -1500 ml   Filed Weights   04/12/18 0500 04/13/18 0444 04/14/18 0500  Weight: 91.2 kg (201 lb 1 oz) 91.5 kg (201 lb 11.5 oz) 93.5 kg (206 lb 2.1 oz)    Examination:  General exam: Alert, awake, oriented x 3 Respiratory system: Crackles at bases bilaterally. Respiratory effort normal. Cardiovascular system:RRR. No murmurs, rubs, gallops. Gastrointestinal system: Abdomen is nondistended, soft and nontender. No organomegaly or masses felt. Normal bowel sounds heard. Central nervous system: Alert and oriented. No focal neurological deficits. Extremities: 1+ edema bilaterally Skin: No rashes, lesions or ulcers Psychiatry: Pleasant, cooperative with exam, wants to go home      Data Reviewed: I have personally reviewed following labs and imaging studies  CBC: Recent Labs  Lab 04/11/18 1614  WBC 5.4  NEUTROABS 3.6  HGB 9.2*  HCT 30.1*  MCV 84.1  PLT 427   Basic Metabolic Panel: Recent Labs  Lab 04/11/18 1614 04/12/18 0108 04/12/18 0709 04/13/18 0420 04/14/18 0402  NA 141  --  141 140 140  K 3.8  --  3.6 3.7 3.1*  CL 98*  --  97* 95* 93*  CO2 32  --  34* 36* 37*  GLUCOSE 107*  --  95 101* 104*  BUN 13  --  12 12 11   CREATININE 1.49*  --  1.44* 1.38* 1.49*  CALCIUM 8.8*  --  8.6* 8.8* 8.8*  MG  --  1.4*  --   --  1.7   GFR: Estimated Creatinine Clearance: 38.4 mL/min (A) (by C-G formula based on SCr of 1.49 mg/dL (H)). Liver Function Tests: Recent Labs  Lab 04/11/18 1614  AST 16  ALT 8*  ALKPHOS 88  BILITOT 1.0  PROT 6.8  ALBUMIN 3.3*   No results for input(s): LIPASE, AMYLASE in the last 168 hours. No results for input(s): AMMONIA in the last 168 hours. Coagulation  Profile: Recent Labs  Lab 04/11/18 1614 04/12/18 0709 04/13/18 0420 04/14/18 0402  INR 1.47 1.63 1.64 1.51   Cardiac Enzymes: Recent Labs  Lab 04/11/18 1614 04/11/18 1922 04/12/18 0108 04/12/18 0709  TROPONINI <0.03 <0.03 <0.03 <0.03   BNP (last 3 results) No results for input(s): PROBNP in the last 8760 hours. HbA1C: No results for input(s): HGBA1C in the last 72 hours. CBG: No results for input(s): GLUCAP in the last 168 hours. Lipid Profile: No results for input(s): CHOL, HDL, LDLCALC, TRIG, CHOLHDL, LDLDIRECT in the last 72 hours. Thyroid Function Tests: No results for input(s): TSH, T4TOTAL, FREET4, T3FREE, THYROIDAB in the last 72 hours. Anemia Panel: No results for input(s): VITAMINB12, FOLATE, FERRITIN, TIBC, IRON, RETICCTPCT in the last 72 hours. Sepsis Labs: No results for input(s): PROCALCITON, LATICACIDVEN in the last 168 hours.  Recent Results (from the past 240 hour(s))  Urine culture     Status: Abnormal   Collection Time: 04/11/18  4:02 PM  Result Value Ref Range Status   Specimen Description   Final    URINE, RANDOM Performed at [facility], [address] [place], [place] [address]    Special Requests   Final    NONE Performed at [facility], [address] [place], [place] [address]    Culture >=100,000 COLONIES/mL ENTEROCOCCUS FAECIUM (A)  Final   Report Status 04/13/2018 FINAL  Final   Organism ID, Bacteria ENTEROCOCCUS FAECIUM (A)  Final      Susceptibility   Enterococcus faecium - MIC*    AMPICILLIN >=32 RESISTANT Resistant     LEVOFLOXACIN >=8 RESISTANT Resistant     NITROFURANTOIN 256 RESISTANT Resistant     VANCOMYCIN <=0.5 SENSITIVE Sensitive     * >=100,000 COLONIES/mL ENTEROCOCCUS FAECIUM  MRSA PCR Screening     Status: None   Collection Time: 04/11/18  9:15 PM  Result Value Ref Range Status   MRSA by PCR NEGATIVE NEGATIVE Final    Comment:        The GeneXpert MRSA Assay (FDA approved for NASAL specimens only), is one  component of a comprehensive MRSA colonization surveillance program. It is not intended to diagnose MRSA infection nor to guide or monitor treatment for MRSA infections. Performed at [facility], [address], [place], [place] [address]          Radiology Studies: No results found.      Scheduled Meds: . allopurinol  100 mg Oral QPM  . cholecalciferol  5,000 Units Oral Daily  . furosemide  60 mg Intravenous Q12H  . gabapentin  300 mg Oral QHS  . latanoprost  1 drop Both Eyes QHS  . mouth rinse  15 mL Mouth Rinse BID  . metoprolol tartrate  12.5 mg Oral BID  . pantoprazole  40 mg Oral Daily  . sodium chloride flush  3 mL Intravenous Q12H  . thyroid  30 mg Oral QAC breakfast  . warfarin  4 mg Oral Once  . Warfarin - Pharmacist Dosing Inpatient   Does not apply Q24H   Continuous Infusions: . sodium chloride    . diltiazem (CARDIZEM) infusion Stopped (04/12/18 0509)     LOS: 3 days    Time spent: 35mins    Kathie Dike, MD [facility] Pager [phone]  If 7PM-7AM, please contact night-coverage [email or web address] Password [facility] 04/14/2018, 4:55 PM

## 2018-04-14 NOTE — Progress Notes (Signed)
Physical Therapy Treatment Patient Details Name: [name] MRN: [id number] DOB: January 03, 1941 Today's Date: 04/14/2018    History of Present Illness [name] is a 77 y.o. female with medical history significant of mental disability, anxiety, A. fib, congestive heart failure, lives alone in the backyard in a trailer close to her sisters.  All of the history is obtained from her sisters who are her primary caregivers.  She does not really comply with salt intake due to her mental disabilities.  She has been swelling in her legs all the way up into her thighs and her abdomen for several weeks now.  Her Lasix was recently increased from 40 mg daily to twice a day this past week and it is not really helping her a lot.  She has been urinating a lot but her swelling is not any better.  Patient does not understand that she has to raise her legs and keep them raised she likes to hang her legs off the bed all day long.  She has not had any fevers.  No nausea vomiting or diarrhea.  Today the sisters noticed that she was more short of breath with walking and she normally is.  Patient referred for admission for worsening heart failure requiring usage of IV diuretics.  Patient has agitated and upset that she is in her room in the stepdown with no windows and no light shining through the windows.  Her mental capabilities about that of a second grader her sisters.    PT Comments    Patient cooperative and demonstrates good return for completing BLE exercises while seated at bedside, limited for gait secondary to fatigue and SOB with O2 saturation dropping to 86-88% while on room air.  Patient tolerated sitting up in chair after therapy - RN notified.  Patient will benefit from continued physical therapy in hospital and recommended venue below to increase strength, balance, endurance for safe ADLs and gait.    Follow Up Recommendations  Home health PT;Supervision for mobility/OOB     Equipment Recommendations  None recommended by PT    Recommendations for Other Services       Precautions / Restrictions Precautions Precautions: Fall Restrictions Weight Bearing Restrictions: No    Mobility  Bed Mobility Overal bed mobility: Needs Assistance Bed Mobility: Supine to Sit     Supine to sit: Supervision        Transfers Overall transfer level: Needs assistance Equipment used: Rolling walker (2 wheeled) Transfers: Sit to/from [facility] Sit to Stand: Min guard Stand pivot transfers: Min guard       General transfer comment: slightly labored movement  Ambulation/Gait Ambulation/Gait assistance: Min guard;Min assist Gait Distance (Feet): 25 Feet Assistive device: Rolling walker (2 wheeled) Gait Pattern/deviations: Decreased step length - right;Decreased step length - left;Decreased stride length Gait velocity: slow   General Gait Details: demonstrates slow labored cadence with tendency to push RW to far in front requiring verbal/tactile cueing to step closer to RW with fair carryover, limited secondary to fatige & SOB, on room air with O2 sats dropping to 86-88%   Stairs             Wheelchair Mobility    Modified Rankin (Stroke Patients Only)       Balance Overall balance assessment: Needs assistance Sitting-balance support: Feet supported;No upper extremity supported Sitting balance-Leahy Scale: Good     Standing balance support: Bilateral upper extremity supported;During functional activity Standing balance-Leahy Scale: Fair  Cognition Arousal/Alertness: Awake/alert Behavior During Therapy: WFL for tasks assessed/performed Overall Cognitive Status: History of cognitive impairments - at baseline                                        Exercises General Exercises - Lower Extremity Ankle Circles/Pumps: Seated;AROM;Strengthening;Both;10 reps Long Arc Quad:  Seated;AROM;Strengthening;Both;10 reps Hip Flexion/Marching: Seated;AROM;Strengthening;Both;10 reps    General Comments        Pertinent Vitals/Pain Pain Assessment: No/denies pain    Home Living                      Prior Function            PT Goals (current goals can now be found in the care plan section) Acute Rehab PT Goals Patient Stated Goal: return home with family to assist PT Goal Formulation: With patient Time For Goal Achievement: 04/19/18 Potential to Achieve Goals: Good Progress towards PT goals: Progressing toward goals    Frequency    Min 3X/week      PT Plan Current plan remains appropriate    Co-evaluation              AM-PAC PT "6 Clicks" Daily Activity  Outcome Measure  Difficulty turning over in bed (including adjusting bedclothes, sheets and blankets)?: None Difficulty moving from lying on back to sitting on the side of the bed? : None Difficulty sitting down on and standing up from a chair with arms (e.g., wheelchair, bedside commode, etc,.)?: A Little Help needed moving to and from a bed to chair (including a wheelchair)?: A Little Help needed walking in hospital room?: A Lot Help needed climbing 3-5 steps with a railing? : A Lot 6 Click Score: 18    End of Session   Activity Tolerance: Patient tolerated treatment well;Patient limited by fatigue Patient left: with call bell/phone within reach;with chair alarm set;in chair Nurse Communication: Mobility status;Other (comment)(RN notified that patient left up in chair) PT Visit Diagnosis: Unsteadiness on feet (R26.81);Other abnormalities of gait and mobility (R26.89);Muscle weakness (generalized) (M62.81)     Time: 0768-0881 PT Time Calculation (min) (ACUTE ONLY): 25 min  Charges:  $Therapeutic Activity: 8-22 mins                    G Codes:       2:02 PM, 04/20/2018 Lonell Grandchild, MPT Physical Therapist with [facility] [phone] office [phone]  mobile phone

## 2018-04-14 NOTE — Progress Notes (Signed)
[place] for Warfarin Indication: atrial fibrillation  No Known Allergies  Patient Measurements: Height: 5\' 8"  (172.7 cm) Weight: 206 lb 2.1 oz (93.5 kg) IBW/kg (Calculated) : 63.9 Heparin Dosing Weight:   Vital Signs: Temp: 98.2 F (36.8 C) (06/13 0500) Temp Source: Oral (06/13 0500) BP: 114/68 (06/13 0500) Pulse Rate: 80 (06/13 0500)  Labs: Recent Labs    04/11/18 1614 04/11/18 1922 04/12/18 0108 04/12/18 0709 04/13/18 0420 04/14/18 0402  HGB 9.2*  --   --   --   --   --   HCT 30.1*  --   --   --   --   --   PLT 256  --   --   --   --   --   LABPROT 17.7*  --   --  19.2* 19.3* 18.1*  INR 1.47  --   --  1.63 1.64 1.51  CREATININE 1.49*  --   --  1.44* 1.38* 1.49*  TROPONINI <0.03 <0.03 <0.03 <0.03  --   --     Estimated Creatinine Clearance: 38.4 mL/min (A) (by C-G formula based on SCr of 1.49 mg/dL (H)).   Medical History: Past Medical History:  Diagnosis Date  . Adenomatous polyps    -resected via colonoscopy in 2011  . Anemia   . Anxiety    MR  . Atrial fibrillation (HCC)    EF of 45%  . Degenerative joint disease    status post right total hip arthroplasty  . Dysrhythmia   . GERD (gastroesophageal reflux disease)    -Barrett's esophagus  . Glaucoma   . Glaucoma   . Gout   . Hyperlipidemia    Recent lipid profile is excellent without lipid-lowering therapy  . Hypertension   . Mental retardation   . Premature ventricular contractions   . Restless leg syndrome   . Shortness of breath dyspnea    Medications:  Medications Prior to Admission  Medication Sig Dispense Refill Last Dose  . allopurinol (ZYLOPRIM) 100 MG tablet Take 100 mg every evening by mouth.    04/10/2018 at Unknown time  . Brinzolamide-Brimonidine (SIMBRINZA) 1-0.2 % SUSP Place 1 drop into both eyes 2 (two) times daily.    04/11/2018 at Unknown time  . Cholecalciferol (VITAMIN D3) 5000 units CAPS Take 1 capsule by mouth daily.    04/11/2018 at  Unknown time  . esomeprazole (NEXIUM 24HR) 20 MG capsule Take 20 mg by mouth daily at 12 noon.   04/11/2018 at Unknown time  . furosemide (LASIX) 40 MG tablet Take 40 mg by mouth 2 (two) times daily. In the morning and at 2P   04/11/2018 at 1400  . gabapentin (NEURONTIN) 300 MG capsule Take 300 mg at bedtime by mouth.   04/10/2018 at Unknown time  . latanoprost (XALATAN) 0.005 % ophthalmic solution Place 1 drop into both eyes at bedtime.     04/10/2018 at Unknown time  . loratadine (CLARITIN) 10 MG tablet Take 10 mg by mouth as needed for allergies.   04/11/2018 at Unknown time  . meclizine (ANTIVERT) 12.5 MG tablet Take 12.5 mg by mouth 3 (three) times daily as needed for dizziness.   04/11/2018 at Unknown time  . metoprolol tartrate (LOPRESSOR) 25 MG tablet Take 0.5 tablets (12.5 mg total) by mouth 2 (two) times daily. 90 tablet 3 04/11/2018 at Unknown time  . neomycin-polymyxin-hydrocortisone (CORTISPORIN) 3.5-10000-1 otic suspension Place 2 drops 4 (four) times daily as needed into both ears.  unknown  . ondansetron (ZOFRAN-ODT) 4 MG disintegrating tablet TAKE ONE Tab BY MOUTH 4-6 hours AS NEEDED for nausea  1 04/11/2018 at Unknown time  . thyroid (ARMOUR) 30 MG tablet Take 30 mg by mouth daily before breakfast.   04/11/2018 at Unknown time  . traMADol (ULTRAM) 50 MG tablet Take 1 tablet (50 mg total) every 6 (six) hours as needed by mouth for moderate pain. For pain 90 tablet 0 Past Month at Unknown time  . warfarin (COUMADIN) 3 MG tablet Take 3 mg by mouth See admin instructions. Take one tablet every other day.   04/11/2018 at [place]  . traZODone (DESYREL) 100 MG tablet Take 1 tablet (100 mg total) at bedtime as needed by mouth for sleep. (Patient not taking: Reported on 04/11/2018)   Not Taking at Unknown time   Assessment: Okay for Protocol, no bleeding noted.  History sensivity to warfarin therapy.  Currently receiving doses every other day. INR below goal today at 1.51.  Goal of Therapy:  INR 2-3    Plan:  Give warfarin 4mg  PO x 1 today. Monitor for signs and symptoms of bleeding.  Daily PT/INR, every other day CBC   Despina Pole 04/14/2018,12:25 PM

## 2018-04-15 DIAGNOSIS — I5033 Acute on chronic diastolic (congestive) heart failure: Secondary | ICD-10-CM

## 2018-04-15 LAB — BASIC METABOLIC PANEL
Anion gap: 8 (ref 5–15)
BUN: 14 mg/dL (ref 6–20)
CHLORIDE: 93 mmol/L — AB (ref 101–111)
CO2: 37 mmol/L — AB (ref 22–32)
CREATININE: 1.68 mg/dL — AB (ref 0.44–1.00)
Calcium: 8.7 mg/dL — ABNORMAL LOW (ref 8.9–10.3)
GFR calc Af Amer: 33 mL/min — ABNORMAL LOW (ref >60)
GFR calc non Af Amer: 28 mL/min — ABNORMAL LOW (ref >60)
GLUCOSE: 96 mg/dL (ref 65–99)
POTASSIUM: 4 mmol/L (ref 3.5–5.1)
SODIUM: 138 mmol/L (ref 135–145)

## 2018-04-15 LAB — CBC
HEMATOCRIT: 27.5 % — AB (ref 36.0–46.0)
Hemoglobin: 8.4 g/dL — ABNORMAL LOW (ref 12.0–15.0)
MCH: 25.9 pg — ABNORMAL LOW (ref 26.0–34.0)
MCHC: 30.5 g/dL (ref 30.0–36.0)
MCV: 84.9 fL (ref 78.0–100.0)
Platelets: 202 10*3/uL (ref 150–400)
RBC: 3.24 MIL/uL — AB (ref 3.87–5.11)
RDW: 19 % — ABNORMAL HIGH (ref 11.5–15.5)
WBC: 4.2 10*3/uL (ref 4.0–10.5)

## 2018-04-15 LAB — PROTIME-INR
INR: 1.66
Prothrombin Time: 19.5 seconds — ABNORMAL HIGH (ref 11.4–15.2)

## 2018-04-15 MED ORDER — WARFARIN SODIUM 2 MG PO TABS: 3.0000 mg | ORAL_TABLET | Freq: Once | ORAL | Status: DC

## 2018-04-15 MED ORDER — MAGNESIUM SULFATE 2 GM/50ML IV SOLN
2.0000 g | Freq: Once | INTRAVENOUS | Status: AC
  Administered 2018-04-15: 2 g via INTRAVENOUS
  Filled 2018-04-15: qty 50

## 2018-04-15 MED ORDER — TORSEMIDE 20 MG PO TABS: 20.0000 mg | ORAL_TABLET | Freq: Two times a day (BID) | ORAL | 0 refills | Status: DC

## 2018-04-15 NOTE — Progress Notes (Signed)
[place] for Warfarin Indication: atrial fibrillation  No Known Allergies  Patient Measurements: Height: 5\' 8"  (172.7 cm) Weight: 206 lb 2.4 oz (93.5 kg) IBW/kg (Calculated) : 63.9 Heparin Dosing Weight:   Vital Signs: Temp: 97.5 F (36.4 C) (06/14 0540) Temp Source: Oral (06/14 0540) BP: 95/84 (06/14 0540) Pulse Rate: 97 (06/14 0542)  Labs: Recent Labs    04/13/18 0420 04/14/18 0402 04/15/18 0455  HGB  --   --  8.4*  HCT  --   --  27.5*  PLT  --   --  202  LABPROT 19.3* 18.1* 19.5*  INR 1.64 1.51 1.66  CREATININE 1.38* 1.49* 1.68*   Estimated Creatinine Clearance: 34 mL/min (A) (by C-G formula based on SCr of 1.68 mg/dL (H)).  Assessment: Okay for Protocol, no bleeding noted.  History sensivity to warfarin therapy.  Currently receiving doses every other day at home. INR below goal today at 1.66.  Goal of Therapy:  INR 2-3   Plan:  Give warfarin 3mg  PO x 1 today. Monitor for signs and symptoms of bleeding.  Daily PT/INR, every other day CBC   Pricilla Larsson 04/15/2018,11:53 AM

## 2018-04-15 NOTE — Progress Notes (Signed)
Patient discharging home.  Waiting for O2 arrival.  IVs removed - WNL.  Reviewed AVS and medications with patient and her niece/caregiver.  Emphasized HF management at home and importance of a low salt diet and daily weighing.  Follow up in place with cardio.  Educational handouts reviewed and given.  No questions at this time and patient is in NAD.  Will DC home once oxygen arrives.

## 2018-04-15 NOTE — Care Management Note (Signed)
Case Management Note  Patient Details  Name: ZAYA KESSENICH MRN: [id number] Date of Birth: [birth year]  Expected Discharge Date:  04/15/18               Expected Discharge Plan:  [place]  In-House Referral:     Discharge planning Services  CM Consult  Post Acute Care Choice:  Home Health, Durable Medical Equipment Choice offered to:  [facility] POA / Guardian  DME Arranged:  Oxygen DME Agency:  [place]:  PT [place] Agency:  [facility]  Status of Service:  Completed, signed off  If discussed at [place] of Stay Meetings, dates discussed:    Additional Comments: DC home today with resumption of [place] services. Family aware HH has 48 hrs to make resumption visit. Pt will need home O2, Kathy, [facility] rep, aware of DME needs (per family choice) and will deliver port tank to pt room prior to DC. DC planning discussed with pt's family at bedside, all in agreement with DC plan.  Sherald Barge, RN 04/15/2018, 11:33 AM

## 2018-04-15 NOTE — Progress Notes (Signed)
SATURATION QUALIFICATIONS: (This note is used to comply with regulatory documentation for home oxygen)  Patient Saturations on Room Air at Rest = 84%  Patient Saturations on Room Air while Ambulating = %  Patient Saturations on  Liters of oxygen while Ambulating = %  Please briefly explain why patient needs home oxygen:  Patients O2 sat decreased to 84% while on RA at rest.  Increased to 94% when placed on 2L O2 [place]

## 2018-04-15 NOTE — Progress Notes (Signed)
Nutrition Education Note  RD consulted for nutrition education regarding acute on chronic onset CHF.  Patient and niece present (caregiver). The patient keeps shouting the Dr didn't tell me I couldn't eat hoop cheese!  RD provided "Heart Failure Nutrition Therapy" handout from the [employer]. Reviewed patient's dietary recall. As noted the niece prepares the meals daily.   Breakfast is usually eggs, sausage patty and biscuit (which she shares with her dog). Other foods the patient  Commonly eats is potato soup, bologna, hot dogs, [employer] Stew, Hom[place] and rice. She drinks water mainly but also drinks coke and likes [place] dry gingerale.  The niece describes pt as a "junk food" lover. Preferred salty snacks  Chips, cheetos and hoop cheese.  Provided examples on ways to decrease sodium intake in diet. Discouraged intake of processed foods and use of salt shaker. Encouraged fresh fruits and vegetables especially at night instead of her usual choice of chips. The niece says they use low sodium vegetables and rinse before heating and canola oil when frying.  RD discussed why it is important for patient to adhere to diet recommendations, and emphasized the role of fluids, foods to avoid, and importance of weighing self daily. Teach back method used.  Expect fair compliance. Niece says, the uncle gives in an lets Ms Ricke eat whatever she wants due to her loud vocal objections.  Body mass index is 31.35 kg/m. Pt meets criteria for obese based on current BMI.  Current diet order is Heart Healthy, patient is consuming approximately 75% of meals at this time. Labs and medications reviewed.   Goals: 1. Patient will weigh herself every morning and keep record. 2. Patient will eat food as prepared without adding salt at the table. 3. Patient will substitute fruit for snack in the evening instead of chips or cheetos.  No further nutrition interventions warranted at this time.  RD contact information provided.   Colman Cater MS,RD,CSG,LDN Office: [phone] Pager: [phone]

## 2018-04-15 NOTE — Progress Notes (Signed)
Patient  Oxygen saturation dropped during night and placed back on 2liters to maintain oxygen saturation. Patient is in upper 70s to 103s without oxygen.  Patient sob stayed consistent and mentation the same.

## 2018-04-15 NOTE — Discharge Summary (Signed)
Physician Discharge Summary  [name] VOH:[id number] DOB: 1941-08-04 DOA: 04/11/2018  PCP: Doree Albee, MD  Admit date: 04/11/2018 Discharge date: 04/15/2018  Admitted From: home Disposition:  home  Recommendations for Outpatient Follow-up:  1. Follow up with PCP in 1-2 weeks 2. Please obtain BMP/CBC in one week 3. Follow-up has been scheduled with cardiology on 6/19  Home Health: Resume home health on discharge Equipment/Devices: Oxygen via nasal cannula 2 L  Discharge Condition: Stable CODE STATUS: Full code Diet recommendation: Heart healthy  Brief/Interim Summary: 77 year old female admitted to the hospital with decompensated CHF.  Family reports that she was seen by her primary care physician who had increased her dosing of Lasix without significant improvement.  She is been admitted for intravenous diuresis.  Also noted to have rapid atrial fibrillation on arrival, since converted to sinus rhythm.    Discharge Diagnoses:  Principal Problem:   Acute on chronic diastolic CHF (congestive heart failure) (HCC) Active Problems:   MENTAL RETARDATION   Chronic anticoagulation   Chronic kidney disease, stage 3 (HCC)   Atrial fibrillation with RVR (HCC)  1. Acute diastolic CHF.  Echo shows normal ejection fraction with diastolic dysfunction.    Patient was treated with intravenous Lasix.  Overall volume status is better.  Edema has improved.  Shortness of breath is better.  Total volume status is negative 5.4 L.  BUN/creatinine trending up with IV Lasix and blood pressure is mildly low.  Will transition to oral diuretics.  After discussing with Dr Bronson Ing, she will be transitioned to oral Demadex 20 mg twice daily.  She will follow-up with cardiology in the next week.  Nutrition consult to discuss dietary restrictions as I feel this is likely driving most of her volume overload..  Continue on beta-blockers. 2. Paroxysmal atrial fibrillation.  Initially presented with rapid  atrial fibrillation.  She has since converted back to sinus rhythm.  Briefly required Cardizem infusion, but now on oral Lopressor.  Anticoagulated with Coumadin. 3. Chronic kidney disease stage III.  Patient presents with a creatinine of 1.4.  This has been stable with diuresis.  Continue to monitor. 4. Hypothyroidism.  Continue on thyroid replacement     Discharge Instructions  Discharge Instructions    Diet - low sodium heart healthy   Complete by:  As directed    Increase activity slowly   Complete by:  As directed      Allergies as of 04/15/2018   No Known Allergies     Medication List    STOP taking these medications   furosemide 40 MG tablet Commonly known as:  LASIX     TAKE these medications   allopurinol 100 MG tablet Commonly known as:  ZYLOPRIM Take 100 mg every evening by mouth.   gabapentin 300 MG capsule Commonly known as:  NEURONTIN Take 300 mg at bedtime by mouth.   loratadine 10 MG tablet Commonly known as:  CLARITIN Take 10 mg by mouth as needed for allergies.   meclizine 12.5 MG tablet Commonly known as:  ANTIVERT Take 12.5 mg by mouth 3 (three) times daily as needed for dizziness.   metoprolol tartrate 25 MG tablet Commonly known as:  LOPRESSOR Take 0.5 tablets (12.5 mg total) by mouth 2 (two) times daily.   neomycin-polymyxin-hydrocortisone 3.5-10000-1 OTIC suspension Commonly known as:  CORTISPORIN Place 2 drops 4 (four) times daily as needed into both ears.   NEXIUM 24HR 20 MG capsule Generic drug:  esomeprazole Take 20 mg by mouth daily  at 12 noon.   ondansetron 4 MG disintegrating tablet Commonly known as:  ZOFRAN-ODT TAKE ONE Tab BY MOUTH 4-6 hours AS NEEDED for nausea   SIMBRINZA 1-0.2 % Susp Generic drug:  Brinzolamide-Brimonidine Place 1 drop into both eyes 2 (two) times daily.   thyroid 30 MG tablet Commonly known as:  ARMOUR Take 30 mg by mouth daily before breakfast.   torsemide 20 MG tablet Commonly known as:   DEMADEX Take 1 tablet (20 mg total) by mouth 2 (two) times daily.   traMADol 50 MG tablet Commonly known as:  ULTRAM Take 1 tablet (50 mg total) every 6 (six) hours as needed by mouth for moderate pain. For pain   traZODone 100 MG tablet Commonly known as:  DESYREL Take 1 tablet (100 mg total) at bedtime as needed by mouth for sleep.   Vitamin D3 5000 units Caps Take 1 capsule by mouth daily.   warfarin 3 MG tablet Commonly known as:  COUMADIN Take 3 mg by mouth See admin instructions. Take one tablet every other day.   XALATAN 0.005 % ophthalmic solution Generic drug:  latanoprost Place 1 drop into both eyes at bedtime.            Durable Medical Equipment  (From admission, onward)        Start     Ordered   04/15/18 1013  For home use only DME oxygen  Once    Question Answer Comment  Mode or (Route) Nasal cannula   Liters per Minute 2   Frequency Continuous (stationary and portable oxygen unit needed)   Oxygen delivery system Gas      04/15/18 1012      No Known Allergies  Consultations:     Procedures/Studies: Dg Chest 2 View  Result Date: 04/11/2018 CLINICAL DATA:  Abdominal swelling.  History of CHF. EXAM: CHEST - 2 VIEW COMPARISON:  September 12, 2017 FINDINGS: Cardiomegaly. Mild edema. Small bilateral effusions with underlying atelectasis. IMPRESSION: Cardiomegaly, small effusions, and mild edema. Electronically Signed   By: Dorise Bullion III M.D   On: 04/11/2018 16:59   US Abdomen Complete  Result Date: 04/11/2018 CLINICAL DATA:  Abdominal swelling EXAM: ABDOMEN ULTRASOUND COMPLETE COMPARISON:  Ultrasound 09/17/2017 FINDINGS: Gallbladder: Contracted gallbladder with gallstones measuring up to 8 mm. Gallbladder wall 3 mm Common bile duct: Diameter: 4 mm Liver: Increased echogenicity of the liver without focal lesion. Portal vein is patent on color Doppler imaging with normal direction of blood flow towards the liver. IVC: No abnormality visualized.  Pancreas: Limited due to bowel gas Spleen: Small spleen 3.6 mm with accessory spleen 14 mm Right Kidney: Length: 8.3 cm. Echogenicity within normal limits. No mass or hydronephrosis visualized. Left Kidney: Length: . Not visualized. This may be due to bowel gas or absence of the kidney. Abdominal aorta: Not visualized due to bowel gas Other findings: No free fluid IMPRESSION: Contracted gallbladder with gallstones.  Common bile duct 4 mm. Left kidney not visualized possibly due to absence or diminished visualization due to bowel gas. Exam detail significantly degraded by patient size and bowel gas. Electronically Signed   By: Franchot Gallo M.D.   On: 04/11/2018 16:55       Subjective: Feeling better today. Shortness of breath is better. Edema improving  Discharge Exam: Vitals:   04/15/18 0540 04/15/18 0542  BP: 95/84   Pulse: 88 97  Resp: 20   Temp: (!) 97.5 F (36.4 C)   SpO2: (!) 84% 97%   Vitals:  04/14/18 2200 04/15/18 0540 04/15/18 0542 04/15/18 0725  BP: 98/62 95/84    Pulse:  88 97   Resp: 16 20    Temp: 98.2 F (36.8 C) (!) 97.5 F (36.4 C)    TempSrc: Oral Oral    SpO2: 94% (!) 84% 97%   Weight:    93.5 kg (206 lb 2.4 oz)  Height:        General: Pt is alert, awake, not in acute distress Cardiovascular: RRR, S1/S2 +, no rubs, no gallops Respiratory: CTA bilaterally, no wheezing, no rhonchi Abdominal: Soft, NT, ND, bowel sounds + Extremities: no edema, no cyanosis    The results of significant diagnostics from this hospitalization (including imaging, microbiology, ancillary and laboratory) are listed below for reference.     Microbiology: Recent Results (from the past 240 hour(s))  Urine culture     Status: Abnormal   Collection Time: 04/11/18  4:02 PM  Result Value Ref Range Status   Specimen Description   Final    URINE, RANDOM Performed at [facility], [address], [place], [place] [address]    Special Requests   Final    NONE Performed at [facility], [address] [place], [place] [address]    Culture >=100,000 COLONIES/mL ENTEROCOCCUS FAECIUM (A)  Final   Report Status 04/13/2018 FINAL  Final   Organism ID, Bacteria ENTEROCOCCUS FAECIUM (A)  Final      Susceptibility   Enterococcus faecium - MIC*    AMPICILLIN >=32 RESISTANT Resistant     LEVOFLOXACIN >=8 RESISTANT Resistant     NITROFURANTOIN 256 RESISTANT Resistant     VANCOMYCIN <=0.5 SENSITIVE Sensitive     * >=100,000 COLONIES/mL ENTEROCOCCUS FAECIUM  MRSA PCR Screening     Status: None   Collection Time: 04/11/18  9:15 PM  Result Value Ref Range Status   MRSA by PCR NEGATIVE NEGATIVE Final    Comment:        The GeneXpert MRSA Assay (FDA approved for NASAL specimens only), is one component of a comprehensive MRSA colonization surveillance program. It is not intended to diagnose MRSA infection nor to guide or monitor treatment for MRSA infections. Performed at [facility], [address], [place],  [address]      Labs: BNP (last 3 results) Recent Labs    04/11/18 1614  BNP 976.7*   Basic Metabolic Panel: Recent Labs  Lab 04/11/18 1614 04/12/18 0108 04/12/18 0709 04/13/18 0420 04/14/18 0402 04/15/18 0455  NA 141  --  141 140 140 138  K 3.8  --  3.6 3.7 3.1* 4.0  CL 98*  --  97* 95* 93* 93*  CO2 32  --  34* 36* 37* 37*  GLUCOSE 107*  --  95 101* 104* 96  BUN 13  --  12 12 11 14   CREATININE 1.49*  --  1.44* 1.38* 1.49* 1.68*  CALCIUM 8.8*  --  8.6* 8.8* 8.8* 8.7*  MG  --  1.4*  --   --  1.7  --    Liver Function Tests: Recent Labs  Lab 04/11/18 1614  AST 16  ALT 8*  ALKPHOS 88  BILITOT 1.0  PROT 6.8  ALBUMIN 3.3*   No results for input(s): LIPASE, AMYLASE in the last 168 hours. No results for input(s): AMMONIA in the last 168 hours. CBC: Recent Labs  Lab 04/11/18 1614 04/15/18 0455  WBC 5.4 4.2  NEUTROABS 3.6  --   HGB 9.2* 8.4*  HCT 30.1* 27.5*  MCV 84.1 84.9  PLT 256 202   Cardiac Enzymes: Recent Labs  Lab  04/11/18 1614 04/11/18 1922 04/12/18 0108 04/12/18 0709  TROPONINI <0.03 <0.03 <0.03 <0.03   BNP: Invalid input(s): POCBNP CBG: No results for input(s): GLUCAP in the last 168 hours. D-Dimer No results for input(s): DDIMER in the last 72 hours. Hgb A1c No results for input(s): HGBA1C in the last 72 hours. Lipid Profile No results for input(s): CHOL, HDL, LDLCALC, TRIG, CHOLHDL, LDLDIRECT in the last 72 hours. Thyroid function studies No results for input(s): TSH, T4TOTAL, T3FREE, THYROIDAB in the last 72 hours.  Invalid input(s): FREET3 Anemia work up No results for input(s): VITAMINB12, FOLATE, FERRITIN, TIBC, IRON, RETICCTPCT in the last 72 hours. Urinalysis    Component Value Date/Time   COLORURINE YELLOW 04/11/2018 1602   APPEARANCEUR CLEAR 04/11/2018 1602   LABSPEC 1.009 04/11/2018 1602   PHURINE 5.0 04/11/2018 1602   GLUCOSEU NEGATIVE 04/11/2018 1602   HGBUR NEGATIVE 04/11/2018 1602   BILIRUBINUR NEGATIVE 04/11/2018 1602   KETONESUR NEGATIVE 04/11/2018 1602   PROTEINUR NEGATIVE 04/11/2018 1602   NITRITE NEGATIVE 04/11/2018 1602   LEUKOCYTESUR NEGATIVE 04/11/2018 1602   Sepsis Labs Invalid input(s): PROCALCITONIN,  WBC,  LACTICIDVEN Microbiology Recent Results (from the past 240 hour(s))  Urine culture     Status: Abnormal   Collection Time: 04/11/18  4:02 PM  Result Value Ref Range Status   Specimen Description   Final    URINE, RANDOM Performed at [facility], [address] [place], [place] [address]    Special Requests   Final    NONE Performed at [facility], [address] [place], [place] [address]    Culture >=100,000 COLONIES/mL ENTEROCOCCUS FAECIUM (A)  Final   Report Status 04/13/2018 FINAL  Final   Organism ID, Bacteria ENTEROCOCCUS FAECIUM (A)  Final      Susceptibility   Enterococcus faecium - MIC*    AMPICILLIN >=32 RESISTANT Resistant     LEVOFLOXACIN >=8 RESISTANT Resistant     NITROFURANTOIN 256 RESISTANT Resistant     VANCOMYCIN  <=0.5 SENSITIVE Sensitive     * >=100,000 COLONIES/mL ENTEROCOCCUS FAECIUM  MRSA PCR Screening     Status: None   Collection Time: 04/11/18  9:15 PM  Result Value Ref Range Status   MRSA by PCR NEGATIVE NEGATIVE Final    Comment:        The GeneXpert MRSA Assay (FDA approved for NASAL specimens only), is one component of a comprehensive MRSA colonization surveillance program. It is not intended to diagnose MRSA infection nor to guide or monitor treatment for MRSA infections. Performed at [facility], [address], [place],  [address]      Time coordinating discharge: 11mins  SIGNED:   Kathie Dike, MD  [facility] 04/15/2018, 11:24 AM Pager   If 7PM-7AM, please contact night-coverage [email or web address] Password [email or web address]

## 2018-04-17 DIAGNOSIS — I480 Paroxysmal atrial fibrillation: Secondary | ICD-10-CM | POA: Diagnosis not present

## 2018-04-17 DIAGNOSIS — Z9981 Dependence on supplemental oxygen: Secondary | ICD-10-CM | POA: Diagnosis not present

## 2018-04-17 DIAGNOSIS — K2270 Barrett's esophagus without dysplasia: Secondary | ICD-10-CM | POA: Diagnosis not present

## 2018-04-17 DIAGNOSIS — G2581 Restless legs syndrome: Secondary | ICD-10-CM | POA: Diagnosis not present

## 2018-04-17 DIAGNOSIS — Z7901 Long term (current) use of anticoagulants: Secondary | ICD-10-CM | POA: Diagnosis not present

## 2018-04-17 DIAGNOSIS — M109 Gout, unspecified: Secondary | ICD-10-CM | POA: Diagnosis not present

## 2018-04-17 DIAGNOSIS — M1991 Primary osteoarthritis, unspecified site: Secondary | ICD-10-CM | POA: Diagnosis not present

## 2018-04-17 DIAGNOSIS — F79 Unspecified intellectual disabilities: Secondary | ICD-10-CM | POA: Diagnosis not present

## 2018-04-17 DIAGNOSIS — Z79899 Other long term (current) drug therapy: Secondary | ICD-10-CM | POA: Diagnosis not present

## 2018-04-17 DIAGNOSIS — I130 Hypertensive heart and chronic kidney disease with heart failure and stage 1 through stage 4 chronic kidney disease, or unspecified chronic kidney disease: Secondary | ICD-10-CM | POA: Diagnosis not present

## 2018-04-17 DIAGNOSIS — F419 Anxiety disorder, unspecified: Secondary | ICD-10-CM | POA: Diagnosis not present

## 2018-04-17 DIAGNOSIS — N183 Chronic kidney disease, stage 3 (moderate): Secondary | ICD-10-CM | POA: Diagnosis not present

## 2018-04-17 DIAGNOSIS — Z96641 Presence of right artificial hip joint: Secondary | ICD-10-CM | POA: Diagnosis not present

## 2018-04-17 DIAGNOSIS — H409 Unspecified glaucoma: Secondary | ICD-10-CM | POA: Diagnosis not present

## 2018-04-17 DIAGNOSIS — I5033 Acute on chronic diastolic (congestive) heart failure: Secondary | ICD-10-CM | POA: Diagnosis not present

## 2018-04-19 DIAGNOSIS — F79 Unspecified intellectual disabilities: Secondary | ICD-10-CM | POA: Diagnosis not present

## 2018-04-19 DIAGNOSIS — I4891 Unspecified atrial fibrillation: Secondary | ICD-10-CM | POA: Diagnosis not present

## 2018-04-19 DIAGNOSIS — K2270 Barrett's esophagus without dysplasia: Secondary | ICD-10-CM | POA: Diagnosis not present

## 2018-04-19 DIAGNOSIS — I480 Paroxysmal atrial fibrillation: Secondary | ICD-10-CM | POA: Diagnosis not present

## 2018-04-19 DIAGNOSIS — I130 Hypertensive heart and chronic kidney disease with heart failure and stage 1 through stage 4 chronic kidney disease, or unspecified chronic kidney disease: Secondary | ICD-10-CM | POA: Diagnosis not present

## 2018-04-19 DIAGNOSIS — I5033 Acute on chronic diastolic (congestive) heart failure: Secondary | ICD-10-CM | POA: Diagnosis not present

## 2018-04-19 DIAGNOSIS — R5383 Other fatigue: Secondary | ICD-10-CM | POA: Diagnosis not present

## 2018-04-19 DIAGNOSIS — I5032 Chronic diastolic (congestive) heart failure: Secondary | ICD-10-CM | POA: Diagnosis not present

## 2018-04-19 DIAGNOSIS — I10 Essential (primary) hypertension: Secondary | ICD-10-CM | POA: Diagnosis not present

## 2018-04-19 DIAGNOSIS — N183 Chronic kidney disease, stage 3 (moderate): Secondary | ICD-10-CM | POA: Diagnosis not present

## 2018-04-20 ENCOUNTER — Encounter: Payer: Self-pay | Admitting: Cardiovascular Disease

## 2018-04-20 ENCOUNTER — Encounter: Payer: Self-pay | Admitting: *Deleted

## 2018-04-20 ENCOUNTER — Ambulatory Visit (INDEPENDENT_AMBULATORY_CARE_PROVIDER_SITE_OTHER): Payer: Medicare Other | Admitting: Cardiovascular Disease

## 2018-04-20 VITALS — BP 100/80 | HR 83 | Ht 68.0 in | Wt 184.8 lb

## 2018-04-20 DIAGNOSIS — N183 Chronic kidney disease, stage 3 unspecified: Secondary | ICD-10-CM

## 2018-04-20 DIAGNOSIS — Z9289 Personal history of other medical treatment: Secondary | ICD-10-CM

## 2018-04-20 DIAGNOSIS — I481 Persistent atrial fibrillation: Secondary | ICD-10-CM

## 2018-04-20 DIAGNOSIS — I4811 Longstanding persistent atrial fibrillation: Secondary | ICD-10-CM

## 2018-04-20 DIAGNOSIS — I5032 Chronic diastolic (congestive) heart failure: Secondary | ICD-10-CM | POA: Diagnosis not present

## 2018-04-20 NOTE — Progress Notes (Signed)
SUBJECTIVE: Patient presents for posthospitalization follow-up.  She has a history of chronic diastolic heart failure, atrial flutter and fibrillation, and hypertension.  I reviewed the most recent echocardiogram performed on 04/12/2018 which showed normal left ventricular systolic function and regional wall motion, LVEF 55 to 42%, diastolic dysfunction with high ventricular filling pressures, and mild aortic, mild mitral, and moderate tricuspid regurgitation.  She was recently discharged on 04/15/2018 after being hospitalized for acute diastolic heart failure.  She was discharged on torsemide 20 mg twice daily after I discussed her case with the hospitalist.  She saw her PCP recently who ordered blood work yesterday.  I do not have a copy of these results and will have to request them.  Blood pressure is normal today 100/80 with oxygen saturations of 99%.  She denies chest pain, palpitations, shortness of breath.  Her daughter says that leg swelling has considerably decreased.  The patient's only complaints are feeling weak and tired.    Review of Systems: As per "subjective", otherwise negative.  No Known Allergies  Current Outpatient Medications  Medication Sig Dispense Refill  . allopurinol (ZYLOPRIM) 100 MG tablet Take 100 mg every evening by mouth.     . Brinzolamide-Brimonidine (SIMBRINZA) 1-0.2 % SUSP Place 1 drop into both eyes 2 (two) times daily.     . Cholecalciferol (VITAMIN D3) 5000 units CAPS Take 1 capsule by mouth daily.     Marland Kitchen esomeprazole (NEXIUM 24HR) 20 MG capsule Take 20 mg by mouth daily at 12 noon.    . gabapentin (NEURONTIN) 300 MG capsule Take 300 mg at bedtime by mouth.    . latanoprost (XALATAN) 0.005 % ophthalmic solution Place 1 drop into both eyes at bedtime.      Marland Kitchen loratadine (CLARITIN) 10 MG tablet Take 10 mg by mouth as needed for allergies.    Marland Kitchen meclizine (ANTIVERT) 12.5 MG tablet Take 12.5 mg by mouth 3 (three) times daily as needed for dizziness.     . neomycin-polymyxin-hydrocortisone (CORTISPORIN) 3.5-10000-1 otic suspension Place 2 drops 4 (four) times daily as needed into both ears.     . ondansetron (ZOFRAN-ODT) 4 MG disintegrating tablet TAKE ONE Tab BY MOUTH 4-6 hours AS NEEDED for nausea  1  . thyroid (ARMOUR) 30 MG tablet Take 30 mg by mouth daily before breakfast.    . torsemide (DEMADEX) 20 MG tablet Take 1 tablet (20 mg total) by mouth 2 (two) times daily. 60 tablet 0  . traMADol (ULTRAM) 50 MG tablet Take 1 tablet (50 mg total) every 6 (six) hours as needed by mouth for moderate pain. For pain 90 tablet 0  . traZODone (DESYREL) 100 MG tablet Take 1 tablet (100 mg total) at bedtime as needed by mouth for sleep.    Marland Kitchen warfarin (COUMADIN) 3 MG tablet Take 3 mg by mouth See admin instructions. Take one tablet every other day.    . metoprolol tartrate (LOPRESSOR) 25 MG tablet Take 0.5 tablets (12.5 mg total) by mouth 2 (two) times daily. 90 tablet 3   No current facility-administered medications for this visit.     Past Medical History:  Diagnosis Date  . Adenomatous polyps    -resected via colonoscopy in 2011  . Anemia   . Anxiety    MR  . Atrial fibrillation (HCC)    EF of 45%  . Degenerative joint disease    status post right total hip arthroplasty  . Dysrhythmia   . GERD (gastroesophageal reflux disease)    -  Barrett's esophagus  . Glaucoma   . Glaucoma   . Gout   . Hyperlipidemia    Recent lipid profile is excellent without lipid-lowering therapy  . Hypertension   . Mental retardation   . Premature ventricular contractions   . Restless leg syndrome   . Shortness of breath dyspnea     Past Surgical History:  Procedure Laterality Date  . CATARACT EXTRACTION  2006   left eye  . COLONOSCOPY W/ POLYPECTOMY  2011  . ESOPHAGOGASTRODUODENOSCOPY N/A 01/01/2016   Procedure: ESOPHAGOGASTRODUODENOSCOPY (EGD);  Surgeon: Rogene Houston, MD;  Location: [facility];  Service: Endoscopy;  Laterality: N/A;  200  . EYE  SURGERY    . JOINT REPLACEMENT     x2  . RADIOLOGY WITH ANESTHESIA Right 11/21/2015   Procedure: MRI OF RIGHT KNEE WITHOUT CONTAST    (RADIOLOGY WITH ANESTHESIA);  Surgeon: Medication Radiologist, MD;  Location: [place];  Service: Radiology;  Laterality: Right;  . TOTAL HIP ARTHROPLASTY  2006   Right    Social History   Socioeconomic History  . Marital status: Single    Spouse name: Not on file  . Number of children: Not on file  . Years of education: Not on file  . Highest education level: Not on file  Occupational History  . Occupation: disabled  Social Needs  . Financial resource strain: Not on file  . Food insecurity:    Worry: Not on file    Inability: Not on file  . Transportation needs:    Medical: Not on file    Non-medical: Not on file  Tobacco Use  . Smoking status: Never Smoker  . Smokeless tobacco: Never Used  Substance and Sexual Activity  . Alcohol use: No  . Drug use: No  . Sexual activity: Not on file  Lifestyle  . Physical activity:    Days per week: Not on file    Minutes per session: Not on file  . Stress: Not on file  Relationships  . Social connections:    Talks on phone: Not on file    Gets together: Not on file    Attends religious service: Not on file    Active member of club or organization: Not on file    Attends meetings of clubs or organizations: Not on file    Relationship status: Not on file  . Intimate partner violence:    Fear of current or ex partner: Not on file    Emotionally abused: Not on file    Physically abused: Not on file    Forced sexual activity: Not on file  Other Topics Concern  . Not on file  Social History Narrative  . Not on file     Vitals:   04/20/18 1343  BP: 100/80  Pulse: 83  SpO2: 98%  Weight: 184 lb 12.8 oz (83.8 kg)  Height: 5\' 8"  (1.727 m)    Wt Readings from Last 3 Encounters:  04/20/18 184 lb 12.8 oz (83.8 kg)  04/15/18 206 lb 2.4 oz (93.5 kg)  01/19/18 207 lb (93.9 kg)     PHYSICAL  EXAM General: NAD HEENT: Normal. Neck: No JVD, no thyromegaly. Lungs: Clear to auscultation bilaterally with normal respiratory effort. CV: Regular rate and irregular rhythm, normal S1/S2, no S3, no murmur.  Trace bilateral lower extremity edema.   Abdomen: Soft, nontender, no distention.  Neurologic: Alert.  Psych: Normal affect. Skin: Normal. Musculoskeletal: No gross deformities.    ECG: Most recent ECG reviewed.  Labs: Lab Results  Component Value Date/Time   K 4.0 04/15/2018 04:55 AM   BUN 14 04/15/2018 04:55 AM   CREATININE 1.68 (H) 04/15/2018 04:55 AM   ALT 8 (L) 04/11/2018 04:14 PM   TSH 1.525 12/26/2015 01:46 PM   TSH 3.852 11/25/2011 10:10 AM   HGB 8.4 (L) 04/15/2018 04:55 AM     Lipids: Lab Results  Component Value Date/Time   LDLCALC 75 07/27/2009   CHOL 132 07/27/2009   TRIG 69 07/27/2009   HDL 43 07/27/2009       ASSESSMENT AND PLAN: 1.  Chronic diastolic heart failure: Appears compensated.  Most recent echocardiogram reviewed above.  Currently on torsemide 20 mg twice daily.  Not on supplemental potassium.  I will obtain a copy of labs from PCP performed on 04/19/2018.  2.  Longstanding persistent atrial fibrillation/flutter: Symptomatically stable.  Heart rate is controlled on metoprolol tartrate 12.5 mg twice daily.  Systemically anticoagulated with warfarin.  No changes to therapy.  3.  Chronic kidney disease stage III: BUN 14, creatinine 1.68 on 04/15/2018.  I will obtain a copy of most recent labs performed by PCP yesterday.    Disposition: Follow up 3 months   Kate Sable, M.D., F.A.C.C.

## 2018-04-20 NOTE — Patient Instructions (Signed)
Medication Instructions:   Your physician recommends that you continue on your current medications as directed. Please refer to the Current Medication list given to you today.  Labwork:  NONE  Testing/Procedures:  NONE  Follow-Up:  Your physician recommends that you schedule a follow-up appointment in: 3 months with Dr. Bronson Ing or APP.  Any Other Special Instructions Will Be Listed Below (If Applicable).  If you need a refill on your cardiac medications before your next appointment, please call your pharmacy.

## 2018-04-21 DIAGNOSIS — K2270 Barrett's esophagus without dysplasia: Secondary | ICD-10-CM | POA: Diagnosis not present

## 2018-04-21 DIAGNOSIS — F79 Unspecified intellectual disabilities: Secondary | ICD-10-CM | POA: Diagnosis not present

## 2018-04-21 DIAGNOSIS — N183 Chronic kidney disease, stage 3 (moderate): Secondary | ICD-10-CM | POA: Diagnosis not present

## 2018-04-21 DIAGNOSIS — I130 Hypertensive heart and chronic kidney disease with heart failure and stage 1 through stage 4 chronic kidney disease, or unspecified chronic kidney disease: Secondary | ICD-10-CM | POA: Diagnosis not present

## 2018-04-21 DIAGNOSIS — I5033 Acute on chronic diastolic (congestive) heart failure: Secondary | ICD-10-CM | POA: Diagnosis not present

## 2018-04-21 DIAGNOSIS — I480 Paroxysmal atrial fibrillation: Secondary | ICD-10-CM | POA: Diagnosis not present

## 2018-04-22 DIAGNOSIS — I480 Paroxysmal atrial fibrillation: Secondary | ICD-10-CM | POA: Diagnosis not present

## 2018-04-22 DIAGNOSIS — I130 Hypertensive heart and chronic kidney disease with heart failure and stage 1 through stage 4 chronic kidney disease, or unspecified chronic kidney disease: Secondary | ICD-10-CM | POA: Diagnosis not present

## 2018-04-22 DIAGNOSIS — K2270 Barrett's esophagus without dysplasia: Secondary | ICD-10-CM | POA: Diagnosis not present

## 2018-04-22 DIAGNOSIS — F79 Unspecified intellectual disabilities: Secondary | ICD-10-CM | POA: Diagnosis not present

## 2018-04-22 DIAGNOSIS — I5033 Acute on chronic diastolic (congestive) heart failure: Secondary | ICD-10-CM | POA: Diagnosis not present

## 2018-04-22 DIAGNOSIS — N183 Chronic kidney disease, stage 3 (moderate): Secondary | ICD-10-CM | POA: Diagnosis not present

## 2018-04-25 DIAGNOSIS — I5033 Acute on chronic diastolic (congestive) heart failure: Secondary | ICD-10-CM | POA: Diagnosis not present

## 2018-04-25 DIAGNOSIS — N183 Chronic kidney disease, stage 3 (moderate): Secondary | ICD-10-CM | POA: Diagnosis not present

## 2018-04-25 DIAGNOSIS — F79 Unspecified intellectual disabilities: Secondary | ICD-10-CM | POA: Diagnosis not present

## 2018-04-25 DIAGNOSIS — K2270 Barrett's esophagus without dysplasia: Secondary | ICD-10-CM | POA: Diagnosis not present

## 2018-04-25 DIAGNOSIS — I480 Paroxysmal atrial fibrillation: Secondary | ICD-10-CM | POA: Diagnosis not present

## 2018-04-25 DIAGNOSIS — I130 Hypertensive heart and chronic kidney disease with heart failure and stage 1 through stage 4 chronic kidney disease, or unspecified chronic kidney disease: Secondary | ICD-10-CM | POA: Diagnosis not present

## 2018-04-26 DIAGNOSIS — I300 Acute nonspecific idiopathic pericarditis: Secondary | ICD-10-CM | POA: Diagnosis not present

## 2018-04-26 DIAGNOSIS — I480 Paroxysmal atrial fibrillation: Secondary | ICD-10-CM | POA: Diagnosis not present

## 2018-04-26 DIAGNOSIS — K2270 Barrett's esophagus without dysplasia: Secondary | ICD-10-CM | POA: Diagnosis not present

## 2018-04-26 DIAGNOSIS — I5033 Acute on chronic diastolic (congestive) heart failure: Secondary | ICD-10-CM | POA: Diagnosis not present

## 2018-04-26 DIAGNOSIS — F79 Unspecified intellectual disabilities: Secondary | ICD-10-CM | POA: Diagnosis not present

## 2018-04-26 DIAGNOSIS — I130 Hypertensive heart and chronic kidney disease with heart failure and stage 1 through stage 4 chronic kidney disease, or unspecified chronic kidney disease: Secondary | ICD-10-CM | POA: Diagnosis not present

## 2018-04-26 DIAGNOSIS — N183 Chronic kidney disease, stage 3 (moderate): Secondary | ICD-10-CM | POA: Diagnosis not present

## 2018-04-28 DIAGNOSIS — I5033 Acute on chronic diastolic (congestive) heart failure: Secondary | ICD-10-CM | POA: Diagnosis not present

## 2018-04-28 DIAGNOSIS — N183 Chronic kidney disease, stage 3 (moderate): Secondary | ICD-10-CM | POA: Diagnosis not present

## 2018-04-28 DIAGNOSIS — F79 Unspecified intellectual disabilities: Secondary | ICD-10-CM | POA: Diagnosis not present

## 2018-04-28 DIAGNOSIS — I480 Paroxysmal atrial fibrillation: Secondary | ICD-10-CM | POA: Diagnosis not present

## 2018-04-28 DIAGNOSIS — I130 Hypertensive heart and chronic kidney disease with heart failure and stage 1 through stage 4 chronic kidney disease, or unspecified chronic kidney disease: Secondary | ICD-10-CM | POA: Diagnosis not present

## 2018-04-28 DIAGNOSIS — K2270 Barrett's esophagus without dysplasia: Secondary | ICD-10-CM | POA: Diagnosis not present

## 2018-05-03 DIAGNOSIS — I480 Paroxysmal atrial fibrillation: Secondary | ICD-10-CM | POA: Diagnosis not present

## 2018-05-03 DIAGNOSIS — I5033 Acute on chronic diastolic (congestive) heart failure: Secondary | ICD-10-CM | POA: Diagnosis not present

## 2018-05-03 DIAGNOSIS — K2270 Barrett's esophagus without dysplasia: Secondary | ICD-10-CM | POA: Diagnosis not present

## 2018-05-03 DIAGNOSIS — F79 Unspecified intellectual disabilities: Secondary | ICD-10-CM | POA: Diagnosis not present

## 2018-05-03 DIAGNOSIS — N183 Chronic kidney disease, stage 3 (moderate): Secondary | ICD-10-CM | POA: Diagnosis not present

## 2018-05-03 DIAGNOSIS — I130 Hypertensive heart and chronic kidney disease with heart failure and stage 1 through stage 4 chronic kidney disease, or unspecified chronic kidney disease: Secondary | ICD-10-CM | POA: Diagnosis not present

## 2018-05-04 DIAGNOSIS — I130 Hypertensive heart and chronic kidney disease with heart failure and stage 1 through stage 4 chronic kidney disease, or unspecified chronic kidney disease: Secondary | ICD-10-CM | POA: Diagnosis not present

## 2018-05-04 DIAGNOSIS — I480 Paroxysmal atrial fibrillation: Secondary | ICD-10-CM | POA: Diagnosis not present

## 2018-05-04 DIAGNOSIS — F79 Unspecified intellectual disabilities: Secondary | ICD-10-CM | POA: Diagnosis not present

## 2018-05-04 DIAGNOSIS — I5033 Acute on chronic diastolic (congestive) heart failure: Secondary | ICD-10-CM | POA: Diagnosis not present

## 2018-05-04 DIAGNOSIS — N183 Chronic kidney disease, stage 3 (moderate): Secondary | ICD-10-CM | POA: Diagnosis not present

## 2018-05-04 DIAGNOSIS — K2270 Barrett's esophagus without dysplasia: Secondary | ICD-10-CM | POA: Diagnosis not present

## 2018-05-06 DIAGNOSIS — I130 Hypertensive heart and chronic kidney disease with heart failure and stage 1 through stage 4 chronic kidney disease, or unspecified chronic kidney disease: Secondary | ICD-10-CM | POA: Diagnosis not present

## 2018-05-06 DIAGNOSIS — I480 Paroxysmal atrial fibrillation: Secondary | ICD-10-CM | POA: Diagnosis not present

## 2018-05-06 DIAGNOSIS — K2270 Barrett's esophagus without dysplasia: Secondary | ICD-10-CM | POA: Diagnosis not present

## 2018-05-06 DIAGNOSIS — I5033 Acute on chronic diastolic (congestive) heart failure: Secondary | ICD-10-CM | POA: Diagnosis not present

## 2018-05-06 DIAGNOSIS — N183 Chronic kidney disease, stage 3 (moderate): Secondary | ICD-10-CM | POA: Diagnosis not present

## 2018-05-06 DIAGNOSIS — F79 Unspecified intellectual disabilities: Secondary | ICD-10-CM | POA: Diagnosis not present

## 2018-05-10 DIAGNOSIS — I480 Paroxysmal atrial fibrillation: Secondary | ICD-10-CM | POA: Diagnosis not present

## 2018-05-10 DIAGNOSIS — N183 Chronic kidney disease, stage 3 (moderate): Secondary | ICD-10-CM | POA: Diagnosis not present

## 2018-05-10 DIAGNOSIS — F79 Unspecified intellectual disabilities: Secondary | ICD-10-CM | POA: Diagnosis not present

## 2018-05-10 DIAGNOSIS — I5033 Acute on chronic diastolic (congestive) heart failure: Secondary | ICD-10-CM | POA: Diagnosis not present

## 2018-05-10 DIAGNOSIS — I130 Hypertensive heart and chronic kidney disease with heart failure and stage 1 through stage 4 chronic kidney disease, or unspecified chronic kidney disease: Secondary | ICD-10-CM | POA: Diagnosis not present

## 2018-05-10 DIAGNOSIS — K2270 Barrett's esophagus without dysplasia: Secondary | ICD-10-CM | POA: Diagnosis not present

## 2018-05-12 DIAGNOSIS — N183 Chronic kidney disease, stage 3 (moderate): Secondary | ICD-10-CM | POA: Diagnosis not present

## 2018-05-12 DIAGNOSIS — F79 Unspecified intellectual disabilities: Secondary | ICD-10-CM | POA: Diagnosis not present

## 2018-05-12 DIAGNOSIS — I130 Hypertensive heart and chronic kidney disease with heart failure and stage 1 through stage 4 chronic kidney disease, or unspecified chronic kidney disease: Secondary | ICD-10-CM | POA: Diagnosis not present

## 2018-05-12 DIAGNOSIS — I480 Paroxysmal atrial fibrillation: Secondary | ICD-10-CM | POA: Diagnosis not present

## 2018-05-12 DIAGNOSIS — I5033 Acute on chronic diastolic (congestive) heart failure: Secondary | ICD-10-CM | POA: Diagnosis not present

## 2018-05-12 DIAGNOSIS — K2270 Barrett's esophagus without dysplasia: Secondary | ICD-10-CM | POA: Diagnosis not present

## 2018-05-16 DIAGNOSIS — I130 Hypertensive heart and chronic kidney disease with heart failure and stage 1 through stage 4 chronic kidney disease, or unspecified chronic kidney disease: Secondary | ICD-10-CM | POA: Diagnosis not present

## 2018-05-16 DIAGNOSIS — N183 Chronic kidney disease, stage 3 (moderate): Secondary | ICD-10-CM | POA: Diagnosis not present

## 2018-05-16 DIAGNOSIS — I480 Paroxysmal atrial fibrillation: Secondary | ICD-10-CM | POA: Diagnosis not present

## 2018-05-16 DIAGNOSIS — K2270 Barrett's esophagus without dysplasia: Secondary | ICD-10-CM | POA: Diagnosis not present

## 2018-05-16 DIAGNOSIS — F79 Unspecified intellectual disabilities: Secondary | ICD-10-CM | POA: Diagnosis not present

## 2018-05-16 DIAGNOSIS — I5033 Acute on chronic diastolic (congestive) heart failure: Secondary | ICD-10-CM | POA: Diagnosis not present

## 2018-05-18 ENCOUNTER — Telehealth: Payer: Self-pay | Admitting: Cardiovascular Disease

## 2018-05-18 MED ORDER — TORSEMIDE 20 MG PO TABS: 20.0000 mg | ORAL_TABLET | Freq: Two times a day (BID) | ORAL | 3 refills | Status: DC

## 2018-05-18 NOTE — Telephone Encounter (Signed)
Rx sent 

## 2018-05-18 NOTE — Telephone Encounter (Signed)
Patient needs new RX for Torsemide sent to [facility]. Phone number is [phone]. / tg

## 2018-05-20 ENCOUNTER — Telehealth: Payer: Self-pay | Admitting: Cardiovascular Disease

## 2018-05-20 MED ORDER — TORSEMIDE 20 MG PO TABS: 20.0000 mg | ORAL_TABLET | Freq: Two times a day (BID) | ORAL | 3 refills | Status: DC

## 2018-05-20 NOTE — Telephone Encounter (Signed)
Niece who cares for pt states she has gained 4 lbs in 2 days.She is going to take an extra demadex today and watch her sodium intake.

## 2018-05-20 NOTE — Telephone Encounter (Signed)
Pt has had a 4lb weight gain on her home scales since Tuesday.

## 2018-05-24 DIAGNOSIS — F79 Unspecified intellectual disabilities: Secondary | ICD-10-CM | POA: Diagnosis not present

## 2018-05-24 DIAGNOSIS — K2270 Barrett's esophagus without dysplasia: Secondary | ICD-10-CM | POA: Diagnosis not present

## 2018-05-24 DIAGNOSIS — I480 Paroxysmal atrial fibrillation: Secondary | ICD-10-CM | POA: Diagnosis not present

## 2018-05-24 DIAGNOSIS — N183 Chronic kidney disease, stage 3 (moderate): Secondary | ICD-10-CM | POA: Diagnosis not present

## 2018-05-24 DIAGNOSIS — I5033 Acute on chronic diastolic (congestive) heart failure: Secondary | ICD-10-CM | POA: Diagnosis not present

## 2018-05-24 DIAGNOSIS — I130 Hypertensive heart and chronic kidney disease with heart failure and stage 1 through stage 4 chronic kidney disease, or unspecified chronic kidney disease: Secondary | ICD-10-CM | POA: Diagnosis not present

## 2018-05-25 DIAGNOSIS — J189 Pneumonia, unspecified organism: Secondary | ICD-10-CM | POA: Diagnosis not present

## 2018-05-30 DIAGNOSIS — N182 Chronic kidney disease, stage 2 (mild): Secondary | ICD-10-CM | POA: Diagnosis not present

## 2018-05-30 DIAGNOSIS — R5383 Other fatigue: Secondary | ICD-10-CM | POA: Diagnosis not present

## 2018-05-30 DIAGNOSIS — R600 Localized edema: Secondary | ICD-10-CM | POA: Diagnosis not present

## 2018-05-30 DIAGNOSIS — Z7901 Long term (current) use of anticoagulants: Secondary | ICD-10-CM | POA: Diagnosis not present

## 2018-05-30 DIAGNOSIS — I10 Essential (primary) hypertension: Secondary | ICD-10-CM | POA: Diagnosis not present

## 2018-05-30 DIAGNOSIS — I5032 Chronic diastolic (congestive) heart failure: Secondary | ICD-10-CM | POA: Diagnosis not present

## 2018-05-31 DIAGNOSIS — I130 Hypertensive heart and chronic kidney disease with heart failure and stage 1 through stage 4 chronic kidney disease, or unspecified chronic kidney disease: Secondary | ICD-10-CM | POA: Diagnosis not present

## 2018-05-31 DIAGNOSIS — I480 Paroxysmal atrial fibrillation: Secondary | ICD-10-CM | POA: Diagnosis not present

## 2018-05-31 DIAGNOSIS — N183 Chronic kidney disease, stage 3 (moderate): Secondary | ICD-10-CM | POA: Diagnosis not present

## 2018-05-31 DIAGNOSIS — K2270 Barrett's esophagus without dysplasia: Secondary | ICD-10-CM | POA: Diagnosis not present

## 2018-05-31 DIAGNOSIS — F79 Unspecified intellectual disabilities: Secondary | ICD-10-CM | POA: Diagnosis not present

## 2018-05-31 DIAGNOSIS — I5033 Acute on chronic diastolic (congestive) heart failure: Secondary | ICD-10-CM | POA: Diagnosis not present

## 2018-06-07 DIAGNOSIS — I130 Hypertensive heart and chronic kidney disease with heart failure and stage 1 through stage 4 chronic kidney disease, or unspecified chronic kidney disease: Secondary | ICD-10-CM | POA: Diagnosis not present

## 2018-06-07 DIAGNOSIS — K2270 Barrett's esophagus without dysplasia: Secondary | ICD-10-CM | POA: Diagnosis not present

## 2018-06-07 DIAGNOSIS — N183 Chronic kidney disease, stage 3 (moderate): Secondary | ICD-10-CM | POA: Diagnosis not present

## 2018-06-07 DIAGNOSIS — I5033 Acute on chronic diastolic (congestive) heart failure: Secondary | ICD-10-CM | POA: Diagnosis not present

## 2018-06-07 DIAGNOSIS — I480 Paroxysmal atrial fibrillation: Secondary | ICD-10-CM | POA: Diagnosis not present

## 2018-06-07 DIAGNOSIS — F79 Unspecified intellectual disabilities: Secondary | ICD-10-CM | POA: Diagnosis not present

## 2018-06-13 DIAGNOSIS — I130 Hypertensive heart and chronic kidney disease with heart failure and stage 1 through stage 4 chronic kidney disease, or unspecified chronic kidney disease: Secondary | ICD-10-CM | POA: Diagnosis not present

## 2018-06-13 DIAGNOSIS — F79 Unspecified intellectual disabilities: Secondary | ICD-10-CM | POA: Diagnosis not present

## 2018-06-13 DIAGNOSIS — I5033 Acute on chronic diastolic (congestive) heart failure: Secondary | ICD-10-CM | POA: Diagnosis not present

## 2018-06-13 DIAGNOSIS — I480 Paroxysmal atrial fibrillation: Secondary | ICD-10-CM | POA: Diagnosis not present

## 2018-06-13 DIAGNOSIS — N183 Chronic kidney disease, stage 3 (moderate): Secondary | ICD-10-CM | POA: Diagnosis not present

## 2018-06-13 DIAGNOSIS — K2270 Barrett's esophagus without dysplasia: Secondary | ICD-10-CM | POA: Diagnosis not present

## 2018-06-23 DIAGNOSIS — I5033 Acute on chronic diastolic (congestive) heart failure: Secondary | ICD-10-CM | POA: Diagnosis not present

## 2018-06-23 DIAGNOSIS — I130 Hypertensive heart and chronic kidney disease with heart failure and stage 1 through stage 4 chronic kidney disease, or unspecified chronic kidney disease: Secondary | ICD-10-CM | POA: Diagnosis not present

## 2018-06-23 DIAGNOSIS — I480 Paroxysmal atrial fibrillation: Secondary | ICD-10-CM | POA: Diagnosis not present

## 2018-06-23 DIAGNOSIS — N183 Chronic kidney disease, stage 3 (moderate): Secondary | ICD-10-CM | POA: Diagnosis not present

## 2018-07-06 ENCOUNTER — Emergency Department (HOSPITAL_COMMUNITY)
Admission: EM | Admit: 2018-07-06 | Discharge: 2018-07-06 | Disposition: A | Discharge status: Home / Self Care | Payer: Medicare Other | Attending: Emergency Medicine | Admitting: Emergency Medicine

## 2018-07-06 ENCOUNTER — Other Ambulatory Visit: Payer: Self-pay

## 2018-07-06 ENCOUNTER — Emergency Department (HOSPITAL_COMMUNITY): Payer: Medicare Other

## 2018-07-06 ENCOUNTER — Encounter (HOSPITAL_COMMUNITY): Payer: Self-pay

## 2018-07-06 DIAGNOSIS — Z7901 Long term (current) use of anticoagulants: Secondary | ICD-10-CM | POA: Insufficient documentation

## 2018-07-06 DIAGNOSIS — R410 Disorientation, unspecified: Secondary | ICD-10-CM | POA: Diagnosis not present

## 2018-07-06 DIAGNOSIS — R000 Tachycardia, unspecified: Secondary | ICD-10-CM | POA: Diagnosis not present

## 2018-07-06 DIAGNOSIS — I509 Heart failure, unspecified: Secondary | ICD-10-CM | POA: Diagnosis not present

## 2018-07-06 DIAGNOSIS — Z96641 Presence of right artificial hip joint: Secondary | ICD-10-CM | POA: Insufficient documentation

## 2018-07-06 DIAGNOSIS — Z79899 Other long term (current) drug therapy: Secondary | ICD-10-CM | POA: Diagnosis not present

## 2018-07-06 DIAGNOSIS — R55 Syncope and collapse: Secondary | ICD-10-CM | POA: Diagnosis not present

## 2018-07-06 DIAGNOSIS — I110 Hypertensive heart disease with heart failure: Secondary | ICD-10-CM | POA: Diagnosis not present

## 2018-07-06 DIAGNOSIS — I4891 Unspecified atrial fibrillation: Secondary | ICD-10-CM | POA: Diagnosis not present

## 2018-07-06 DIAGNOSIS — R112 Nausea with vomiting, unspecified: Secondary | ICD-10-CM | POA: Diagnosis not present

## 2018-07-06 DIAGNOSIS — R0902 Hypoxemia: Secondary | ICD-10-CM | POA: Diagnosis not present

## 2018-07-06 DIAGNOSIS — R0689 Other abnormalities of breathing: Secondary | ICD-10-CM | POA: Diagnosis not present

## 2018-07-06 LAB — COMPREHENSIVE METABOLIC PANEL
ALBUMIN: 3.8 g/dL (ref 3.5–5.0)
ALT: 12 U/L (ref 0–44)
ANION GAP: 11 (ref 5–15)
AST: 17 U/L (ref 15–41)
Alkaline Phosphatase: 80 U/L (ref 38–126)
BILIRUBIN TOTAL: 0.6 mg/dL (ref 0.3–1.2)
BUN: 28 mg/dL — ABNORMAL HIGH (ref 8–23)
CHLORIDE: 102 mmol/L (ref 98–111)
CO2: 26 mmol/L (ref 22–32)
Calcium: 9.2 mg/dL (ref 8.9–10.3)
Creatinine, Ser: 2.29 mg/dL — ABNORMAL HIGH (ref 0.44–1.00)
GFR calc Af Amer: 23 mL/min — ABNORMAL LOW (ref >60)
GFR calc non Af Amer: 20 mL/min — ABNORMAL LOW (ref >60)
GLUCOSE: 119 mg/dL — AB (ref 70–99)
POTASSIUM: 4.6 mmol/L (ref 3.5–5.1)
SODIUM: 139 mmol/L (ref 135–145)
TOTAL PROTEIN: 7.5 g/dL (ref 6.5–8.1)

## 2018-07-06 LAB — CBC WITH DIFFERENTIAL/PLATELET
BASOS ABS: 0 10*3/uL (ref 0.0–0.1)
Basophils Relative: 0 %
Eosinophils Absolute: 0.1 10*3/uL (ref 0.0–0.7)
Eosinophils Relative: 2 %
HEMATOCRIT: 30.5 % — AB (ref 36.0–46.0)
Hemoglobin: 9.3 g/dL — ABNORMAL LOW (ref 12.0–15.0)
LYMPHS PCT: 12 %
Lymphs Abs: 0.7 10*3/uL (ref 0.7–4.0)
MCH: 25.9 pg — ABNORMAL LOW (ref 26.0–34.0)
MCHC: 30.5 g/dL (ref 30.0–36.0)
MCV: 85 fL (ref 78.0–100.0)
MONO ABS: 0.4 10*3/uL (ref 0.1–1.0)
Monocytes Relative: 7 %
NEUTROS ABS: 4.5 10*3/uL (ref 1.7–7.7)
Neutrophils Relative %: 79 %
Platelets: 173 10*3/uL (ref 150–400)
RBC: 3.59 MIL/uL — ABNORMAL LOW (ref 3.87–5.11)
RDW: 20.4 % — AB (ref 11.5–15.5)
WBC: 5.7 10*3/uL (ref 4.0–10.5)

## 2018-07-06 LAB — URINALYSIS, COMPLETE (UACMP) WITH MICROSCOPIC
BILIRUBIN URINE: NEGATIVE
Glucose, UA: NEGATIVE mg/dL
HGB URINE DIPSTICK: NEGATIVE
Ketones, ur: NEGATIVE mg/dL
Nitrite: NEGATIVE
PH: 5 (ref 5.0–8.0)
Protein, ur: NEGATIVE mg/dL
SPECIFIC GRAVITY, URINE: 1.011 (ref 1.005–1.030)

## 2018-07-06 LAB — CBG MONITORING, ED: GLUCOSE-CAPILLARY: 109 mg/dL — AB (ref 70–99)

## 2018-07-06 MED ORDER — SODIUM CHLORIDE 0.9 % IV BOLUS
1000.0000 mL | Freq: Once | INTRAVENOUS | Status: AC
  Administered 2018-07-06: 1000 mL via INTRAVENOUS

## 2018-07-06 MED ORDER — SODIUM CHLORIDE 0.9 % IV SOLN
INTRAVENOUS | Status: DC
  Administered 2018-07-06: 16:00:00 via INTRAVENOUS

## 2018-07-06 NOTE — ED Notes (Signed)
Pt wheeled to car by NT Legrand Como and family. Family verbalized understanding of D/C instructions.

## 2018-07-06 NOTE — ED Provider Notes (Signed)
[facility] EMERGENCY DEPARTMENT Provider Note   CSN: [id number] Arrival date & time: 07/06/18  1515     History   Chief Complaint Chief Complaint  Patient presents with  . Loss of Consciousness    HPI [name] is a 77 y.o. female.  HPI Patient with multiple medical issues including cognitive delay presents via EMS due to episode of altered mental status. The patient herself seems to recall an episode of unconsciousness, though her ability as a historian is questionable, as she repeatedly asks whether she can go home during the initial evaluation, and to transition to our gurney from the EMS stretcher. Level 5 caveat secondary to cognitive state. EMS providers note that the patient was with family members, was in a warm environment, she noticed having an episode of decreased interactivity, slumped over, possibly for several minutes, possibly without pulses. No CPR, but the patient gradually recovered to normal interactivity. The patient herself states that she feels fine.  Past Medical History:  Diagnosis Date  . Adenomatous polyps    -resected via colonoscopy in 2011  . Anemia   . Anxiety    MR  . Atrial fibrillation (HCC)    EF of 45%  . Degenerative joint disease    status post right total hip arthroplasty  . Dysrhythmia   . GERD (gastroesophageal reflux disease)    -Barrett's esophagus  . Glaucoma   . Glaucoma   . Gout   . Hyperlipidemia    Recent lipid profile is excellent without lipid-lowering therapy  . Hypertension   . Mental retardation   . Premature ventricular contractions   . Restless leg syndrome   . Shortness of breath dyspnea     Patient Active Problem List   Diagnosis Date Noted  . CHF, acute on chronic ([place]) 04/11/2018  . Atrial fibrillation with RVR ([place]) 04/11/2018  . Electrolyte depletion 09/18/2017  . Elevated alkaline phosphatase level 09/18/2017  . Acute renal failure superimposed on chronic kidney disease ([place])   . Elevated LFTs    . Hypomagnesemia   . Mental retardation   . Hypokalemia 09/11/2017  . Hyponatremia 09/11/2017  . Acute renal injury ([place]) 09/11/2017  . Dehydration 09/11/2017  . Supratherapeutic INR 09/11/2017  . Hyperbilirubinemia 09/11/2017  . Elevated troponin 09/11/2017  . Vulvovaginitis due to yeast 09/11/2017  . Syncope 12/26/2015  . Chronic anticoagulation 12/20/2012  . Chronic kidney disease, stage 3 ([place]) 12/20/2012  . Anemia, normocytic normochromic 12/20/2012  . History of diagnostic tests 12/20/2012  . Acute on chronic diastolic CHF (congestive heart failure) ([place]) 11/25/2011  . Atrial fibrillation ([place])   . Degenerative joint disease   . GERD (gastroesophageal reflux disease)   . Gout   . Adenomatous polyps   . MENTAL RETARDATION 07/30/2009  . GLAUCOMA 06/25/2009    Past Surgical History:  Procedure Laterality Date  . CATARACT EXTRACTION  2006   left eye  . COLONOSCOPY W/ POLYPECTOMY  2011  . ESOPHAGOGASTRODUODENOSCOPY N/A 01/01/2016   Procedure: ESOPHAGOGASTRODUODENOSCOPY (EGD);  Surgeon: Rogene Houston, MD;  [facility];  Service: Endoscopy;  Laterality: N/A;  200  . EYE SURGERY    . JOINT REPLACEMENT     x2  . RADIOLOGY WITH ANESTHESIA Right 11/21/2015   Procedure: MRI OF RIGHT KNEE WITHOUT CONTAST    (RADIOLOGY WITH ANESTHESIA);  Surgeon: Medication Radiologist, MD;  Location: [place];  Service: Radiology;  Laterality: Right;  . TOTAL HIP ARTHROPLASTY  2006   Right  OB History   None      Home Medications    Prior to Admission medications   Medication Sig Start Date End Date Taking? Authorizing Provider  acetaminophen (TYLENOL) 500 MG tablet Take 500-1,000 mg by mouth every 6 (six) hours as needed for mild pain or moderate pain.   Yes [provider]  allopurinol (ZYLOPRIM) 100 MG tablet Take 100 mg every evening by mouth.    Yes [provider]  Brinzolamide-Brimonidine (SIMBRINZA) 1-0.2 % SUSP Place 1 drop into both eyes 2 (two)  times daily.    Yes [provider]  Cholecalciferol (VITAMIN D3) 5000 units CAPS Take 1 capsule by mouth daily.    Yes [provider]  gabapentin (NEURONTIN) 300 MG capsule Take 300 mg at bedtime by mouth. 08/23/17  Yes [provider]  latanoprost (XALATAN) 0.005 % ophthalmic solution Place 1 drop into both eyes at bedtime.     Yes [provider]  loratadine (CLARITIN) 10 MG tablet Take 10 mg by mouth daily.    Yes [provider]  meclizine (ANTIVERT) 12.5 MG tablet Take 12.5 mg by mouth 3 (three) times daily as needed for dizziness.   Yes [provider]  metoprolol tartrate (LOPRESSOR) 25 MG tablet Take 0.5 tablets (12.5 mg total) by mouth 2 (two) times daily. 12/20/17 07/06/18 Yes Herminio Commons, MD  neomycin-polymyxin-hydrocortisone (CORTISPORIN) 3.5-10000-1 otic suspension Place 2 drops 4 (four) times daily as needed into both ears.    Yes [provider]  ondansetron (ZOFRAN-ODT) 4 MG disintegrating tablet Take 4 mg by mouth every 4 (four) hours as needed for nausea or vomiting.  03/17/18  Yes [provider]  potassium chloride SA (K-DUR,KLOR-CON) 20 MEQ tablet Take 40 mEq by mouth daily.   Yes [provider]  thyroid (ARMOUR) 90 MG tablet Take 90 mg by mouth daily before breakfast.    Yes [provider]  torsemide (DEMADEX) 20 MG tablet Take 1 tablet (20 mg total) by mouth 2 (two) times daily. 05/20/18  Yes Herminio Commons, MD  traMADol (ULTRAM) 50 MG tablet Take 1 tablet (50 mg total) every 6 (six) hours as needed by mouth for moderate pain. For pain 09/18/17  Yes Debbe Odea, MD  traZODone (DESYREL) 100 MG tablet Take 1 tablet (100 mg total) at bedtime as needed by mouth for sleep. 09/18/17  Yes Debbe Odea, MD  warfarin (COUMADIN) 3 MG tablet Take 3 mg by mouth every other day.    Yes [provider]    Family History No family history on file.  Social History Social  History   Tobacco Use  . Smoking status: Never Smoker  . Smokeless tobacco: Never Used  Substance Use Topics  . Alcohol use: No  . Drug use: No     Allergies   Patient has no known allergies.   Review of Systems Review of Systems  Unable to perform ROS: Other  cognitive delay   Physical Exam Updated Vital Signs BP (!) 150/80   Pulse 84   Resp 17   Wt 83.8 kg   SpO2 100%   BMI 28.09 kg/m   Physical Exam  Constitutional: She is oriented to person, place, and time. She appears well-developed and well-nourished. No distress.  HENT:  Head: Normocephalic and atraumatic.  Eyes: Conjunctivae and EOM are normal.  Cardiovascular: Normal rate. An irregularly irregular rhythm present.  Pulmonary/Chest: Effort normal and breath sounds normal. No stridor. No respiratory distress.  Abdominal: She  exhibits no distension.  Musculoskeletal: She exhibits no edema.  Neurological: She is alert and oriented to person, place, and time. No cranial nerve deficit.  Skin: Skin is warm and dry.  Psychiatric: Her affect is blunt and labile. Her speech is delayed. She is withdrawn. Cognition and memory are impaired.  Nursing note and vitals reviewed.    ED Treatments / Results  Labs (all labs ordered are listed, but only abnormal results are displayed) Labs Reviewed  COMPREHENSIVE METABOLIC PANEL - Abnormal; Notable for the following components:      Result Value   Glucose, Bld 119 (*)    BUN 28 (*)    Creatinine, Ser 2.29 (*)    GFR calc non Af Amer 20 (*)    GFR calc Af Amer 23 (*)    All other components within normal limits  CBC WITH DIFFERENTIAL/PLATELET - Abnormal; Notable for the following components:   RBC 3.59 (*)    Hemoglobin 9.3 (*)    HCT 30.5 (*)    MCH 25.9 (*)    RDW 20.4 (*)    All other components within normal limits  URINALYSIS, COMPLETE (UACMP) WITH MICROSCOPIC - Abnormal; Notable for the following components:   APPearance HAZY (*)    Leukocytes, UA TRACE  (*)    Bacteria, UA RARE (*)    All other components within normal limits  CBG MONITORING, ED - Abnormal; Notable for the following components:   Glucose-Capillary 109 (*)    All other components within normal limits    EMS rhythm strip without notable findings, narrow complex irregular rhythm She does have a history of atrial fibrillation, however.  EKG EKG Interpretation  Date/Time:  Wednesday July 06 2018 15:25:57 EDT Ventricular Rate:  105 PR Interval:    QRS Duration: 96 QT Interval:  337 QTC Calculation: 446 R Axis:   118 Text Interpretation:  Atrial fibrillation Ventricular premature complex Low voltage, precordial leads Probable right ventricular hypertrophy Borderline T abnormalities, diffuse leads Artifact Abnormal ekg Confirmed by Carmin Muskrat [phone]) on 07/06/2018 3:45:34 PM   Radiology Dg Chest Port 1 View  Result Date: 07/06/2018 CLINICAL DATA:  Altered level of consciousness. Patient found unresponsive in cyanotic. EXAM: PORTABLE CHEST 1 VIEW COMPARISON:  PA and lateral chest 04/11/2018. Single-view of the chest 09/12/2007. FINDINGS: There is cardiomegaly and pulmonary edema. No pneumothorax. Likely left pleural effusion is noted. No acute or focal bony abnormality. IMPRESSION: Congestive heart failure. Electronically Signed   By: Inge Rise M.D.   On: 07/06/2018 15:56     Procedures Procedures (including critical care time)  Medications Ordered in ED Medications  sodium chloride 0.9 % bolus 1,000 mL (0 mLs Intravenous Stopped 07/06/18 1658)    And  0.9 %  sodium chloride infusion ( Intravenous New Bag/Given 07/06/18 1530)     Initial Impression / Assessment and Plan / ED Course  I have reviewed the triage vital signs and the nursing notes.  Pertinent labs & imaging results that were available during my care of the patient were reviewed by me and considered in my medical decision making (see chart for details).     6:47 PM Patient calm, smiling,  sitting upright, stating that she feels fine. She is now accompanied by 2 family members, 1 of whom was with her earlier today.  I went a lengthy conversation about all findings, including some evidence for both dehydration, as well as worsening heart failure, but no evidence for pneumonia, ACS. Patient continues to move all  extremity spontaneously, has no focal neurologic deficits, and seizure remains a consideration, though she has had no additional seizure activity. After lengthy conversation about possible etiologies for her episode of altered mental status, including after mentioned change in hydration status versus seizure versus sustained A. fib with tachycardia, we discussed risks and benefits, pros and cons of admission versus outpatient follow-up closely. Given the patient's cognitive state, this latter option was selected by family members, and seems reasonable given the absence of recurrence here, absence of hemodynamic instability, and absence of complaints by the patient herself. Patient discharged in stable condition after fluid resuscitation was complete.   Final Clinical Impressions(s) / ED Diagnoses  Altered mental status   Carmin Muskrat, MD 07/06/18 [phone]

## 2018-07-06 NOTE — ED Triage Notes (Addendum)
Pt was found by caretaker unresponsive and cyanotic. Pt was like this for a few mins. When EMS arrived pt was diaphoretic and pale. Pt now responsive and alert in ED. NAD. VSS. A fib on the monitor. Vomited with EMS and given 4 of Zofran

## 2018-07-06 NOTE — Discharge Instructions (Addendum)
As discussed, your evaluation today has been largely reassuring.  But, it is important that you monitor your condition carefully, and do not hesitate to return to the ED if you develop new, or concerning changes in your condition. ? ?Otherwise, please follow-up with your physician for appropriate ongoing care. ? ?

## 2018-07-06 NOTE — ED Notes (Signed)
Pt wheeled to waiting room. Pt verbalized understanding of discharge instructions.   

## 2018-07-12 DIAGNOSIS — I70203 Unspecified atherosclerosis of native arteries of extremities, bilateral legs: Secondary | ICD-10-CM | POA: Diagnosis not present

## 2018-07-12 DIAGNOSIS — M79676 Pain in unspecified toe(s): Secondary | ICD-10-CM | POA: Diagnosis not present

## 2018-07-12 DIAGNOSIS — B351 Tinea unguium: Secondary | ICD-10-CM | POA: Diagnosis not present

## 2018-07-13 DIAGNOSIS — R4182 Altered mental status, unspecified: Secondary | ICD-10-CM | POA: Diagnosis not present

## 2018-07-13 DIAGNOSIS — G40009 Localization-related (focal) (partial) idiopathic epilepsy and epileptic syndromes with seizures of localized onset, not intractable, without status epilepticus: Secondary | ICD-10-CM | POA: Diagnosis not present

## 2018-07-13 DIAGNOSIS — I10 Essential (primary) hypertension: Secondary | ICD-10-CM | POA: Diagnosis not present

## 2018-07-14 ENCOUNTER — Other Ambulatory Visit (HOSPITAL_COMMUNITY): Payer: Self-pay | Admitting: Internal Medicine

## 2018-07-14 DIAGNOSIS — G40009 Localization-related (focal) (partial) idiopathic epilepsy and epileptic syndromes with seizures of localized onset, not intractable, without status epilepticus: Secondary | ICD-10-CM

## 2018-07-22 DIAGNOSIS — Z7901 Long term (current) use of anticoagulants: Secondary | ICD-10-CM | POA: Diagnosis not present

## 2018-07-22 DIAGNOSIS — I482 Chronic atrial fibrillation: Secondary | ICD-10-CM | POA: Diagnosis not present

## 2018-07-26 ENCOUNTER — Ambulatory Visit: Payer: Medicare Other | Admitting: Cardiovascular Disease

## 2018-08-08 ENCOUNTER — Other Ambulatory Visit (HOSPITAL_COMMUNITY)
Admission: RE | Admit: 2018-08-08 | Discharge: 2018-08-08 | Disposition: A | Discharge status: Home / Self Care | Payer: Medicare Other | Source: Ambulatory Visit | Attending: Cardiovascular Disease | Admitting: Cardiovascular Disease

## 2018-08-08 ENCOUNTER — Encounter: Payer: Self-pay | Admitting: Cardiovascular Disease

## 2018-08-08 ENCOUNTER — Ambulatory Visit (INDEPENDENT_AMBULATORY_CARE_PROVIDER_SITE_OTHER): Payer: Medicare Other | Admitting: Cardiovascular Disease

## 2018-08-08 VITALS — BP 115/80 | HR 89 | Ht 67.0 in | Wt 181.0 lb

## 2018-08-08 DIAGNOSIS — N183 Chronic kidney disease, stage 3 unspecified: Secondary | ICD-10-CM

## 2018-08-08 DIAGNOSIS — R079 Chest pain, unspecified: Secondary | ICD-10-CM | POA: Diagnosis not present

## 2018-08-08 DIAGNOSIS — R55 Syncope and collapse: Secondary | ICD-10-CM | POA: Diagnosis not present

## 2018-08-08 DIAGNOSIS — R0789 Other chest pain: Secondary | ICD-10-CM | POA: Diagnosis not present

## 2018-08-08 DIAGNOSIS — Z79899 Other long term (current) drug therapy: Secondary | ICD-10-CM | POA: Diagnosis not present

## 2018-08-08 DIAGNOSIS — I4811 Longstanding persistent atrial fibrillation: Secondary | ICD-10-CM

## 2018-08-08 DIAGNOSIS — R0609 Other forms of dyspnea: Secondary | ICD-10-CM | POA: Diagnosis not present

## 2018-08-08 DIAGNOSIS — I5032 Chronic diastolic (congestive) heart failure: Secondary | ICD-10-CM

## 2018-08-08 DIAGNOSIS — Z9289 Personal history of other medical treatment: Secondary | ICD-10-CM

## 2018-08-08 LAB — BASIC METABOLIC PANEL
ANION GAP: 9 (ref 5–15)
BUN: 17 mg/dL (ref 8–23)
CHLORIDE: 97 mmol/L — AB (ref 98–111)
CO2: 30 mmol/L (ref 22–32)
Calcium: 9.1 mg/dL (ref 8.9–10.3)
Creatinine, Ser: 1.45 mg/dL — ABNORMAL HIGH (ref 0.44–1.00)
GFR calc non Af Amer: 34 mL/min — ABNORMAL LOW (ref >60)
GFR, EST AFRICAN AMERICAN: 39 mL/min — AB (ref >60)
Glucose, Bld: 86 mg/dL (ref 70–99)
POTASSIUM: 4.6 mmol/L (ref 3.5–5.1)
SODIUM: 136 mmol/L (ref 135–145)

## 2018-08-08 NOTE — Progress Notes (Signed)
SUBJECTIVE: The patient presents for routine follow-up she was evaluated in the ED in early September 2019 for altered mental status.  She may have had a syncopal episode.  It appears she was with family members and found to be slumped over possibly for several minutes and possibly without pulses.  Labs showed BUN 28 and creatinine 2.29.  Creatinine had been 1.68 on 04/15/2018.  Hemoglobin was low at 9.3.  Urinalysis was unremarkable.  Glucose was 109.  ECG showed atrial fibrillation. She was given IV fluids.  She has a history of chronic diastolic heart failure, atrial flutter and fibrillation, and hypertension.  Echocardiogram performed on 04/12/2018 showed normal left ventricular systolic function and regional wall motion, LVEF 55 to 99%, diastolic dysfunction with high ventricular filling pressures, and mild aortic, mild mitral, and moderate tricuspid regurgitation.  Since the episode in early September, she has had more exertional fatigue and dyspnea as noted by her daughter.  She has had some chest discomfort which they have attributed to indigestion.  She has had some more swelling of her feet recently and her daughter has been giving her an extra torsemide tablet about every other day.  Prior to this they had reduce torsemide to 20 mg daily. Family members have noted increased exertional dyspnea and fatigue when walking about 25 feet from her bed to the shower.  She is scheduled for an MRI on 08/11/2018 as ordered by her PCP.  Home scales have her weight ranging in the 162-163 pound range with a walker.  It is 181 pounds in our office.  There has been no recent weight gain.  She denies orthopnea and paroxysmal nocturnal dyspnea.    Review of Systems: As per "subjective", otherwise negative.  No Known Allergies  Current Outpatient Medications  Medication Sig Dispense Refill  . acetaminophen (TYLENOL) 500 MG tablet Take 500-1,000 mg by mouth 2 (two) times daily as needed for mild  pain or moderate pain.     Marland Kitchen allopurinol (ZYLOPRIM) 100 MG tablet Take 100 mg by mouth at bedtime.     . Brinzolamide-Brimonidine (SIMBRINZA) 1-0.2 % SUSP Place 1 drop into both eyes 2 (two) times daily.     . Cholecalciferol (VITAMIN D3) 5000 units CAPS Take 5,000 Units by mouth daily.     Marland Kitchen esomeprazole (NEXIUM) 20 MG packet Take 20 mg by mouth daily.    . fluticasone (FLONASE) 50 MCG/ACT nasal spray Place 1 spray into both nostrils daily.    Marland Kitchen gabapentin (NEURONTIN) 300 MG capsule Take 300 mg at bedtime by mouth.    Marland Kitchen guaiFENesin-dextromethorphan (ROBITUSSIN DM) 100-10 MG/5ML syrup Take 5 mLs by mouth every 4 (four) hours as needed for cough.    . latanoprost (XALATAN) 0.005 % ophthalmic solution Place 1 drop into both eyes at bedtime.      Marland Kitchen loratadine (CLARITIN) 10 MG tablet Take 10 mg by mouth daily.     . meclizine (ANTIVERT) 12.5 MG tablet Take 12.5 mg by mouth 3 (three) times daily as needed for dizziness.    . metoprolol tartrate (LOPRESSOR) 25 MG tablet Take 0.5 tablets (12.5 mg total) by mouth 2 (two) times daily. 90 tablet 3  . neomycin-polymyxin-hydrocortisone (CORTISPORIN) 3.5-10000-1 otic suspension Place 2-3 drops into both ears 2 (two) times daily as needed (ear pain).     . ondansetron (ZOFRAN-ODT) 4 MG disintegrating tablet Take 4 mg by mouth every 4 (four) hours as needed for nausea or vomiting.   1  . potassium  chloride SA (K-DUR,KLOR-CON) 20 MEQ tablet Take 20 mEq by mouth 2 (two) times daily.     Marland Kitchen thyroid (ARMOUR) 90 MG tablet Take 90 mg by mouth daily before breakfast.     . torsemide (DEMADEX) 20 MG tablet Take 1 tablet (20 mg total) by mouth 2 (two) times daily. 180 tablet 3  . traZODone (DESYREL) 100 MG tablet Take 1 tablet (100 mg total) at bedtime as needed by mouth for sleep. (Patient taking differently: Take 100 mg by mouth at bedtime. )    . warfarin (COUMADIN) 3 MG tablet Take 3 mg by mouth daily.      No current facility-administered medications for this visit.      Past Medical History:  Diagnosis Date  . Adenomatous polyps    -resected via colonoscopy in 2011  . Anemia   . Anxiety    MR  . Atrial fibrillation (HCC)    EF of 45%  . Degenerative joint disease    status post right total hip arthroplasty  . Dysrhythmia   . GERD (gastroesophageal reflux disease)    -Barrett's esophagus  . Glaucoma   . Glaucoma   . Gout   . Hyperlipidemia    Recent lipid profile is excellent without lipid-lowering therapy  . Hypertension   . Mental retardation   . Premature ventricular contractions   . Restless leg syndrome   . Shortness of breath dyspnea     Past Surgical History:  Procedure Laterality Date  . CATARACT EXTRACTION  2006   left eye  . COLONOSCOPY W/ POLYPECTOMY  2011  . ESOPHAGOGASTRODUODENOSCOPY N/A 01/01/2016   Procedure: ESOPHAGOGASTRODUODENOSCOPY (EGD);  Surgeon: Rogene Houston, MD;  [facility];  Service: Endoscopy;  Laterality: N/A;  200  . EYE SURGERY    . JOINT REPLACEMENT     x2  . RADIOLOGY WITH ANESTHESIA Right 11/21/2015   Procedure: MRI OF RIGHT KNEE WITHOUT CONTAST    (RADIOLOGY WITH ANESTHESIA);  Surgeon: Medication Radiologist, MD;  Location: [facility];  Service: Radiology;  Laterality: Right;  . TOTAL HIP ARTHROPLASTY  2006   Right    Social History   Socioeconomic History  . Marital status: Single    Spouse name: Not on file  . Number of children: Not on file  . Years of education: Not on file  . Highest education level: Not on file  Occupational History  . Occupation: disabled  Social Needs  . Financial resource strain: Not on file  . Food insecurity:    Worry: Not on file    Inability: Not on file  . Transportation needs:    Medical: Not on file    Non-medical: Not on file  Tobacco Use  . Smoking status: Never Smoker  . Smokeless tobacco: Never Used  Substance and Sexual Activity  . Alcohol use: No  . Drug use: No  . Sexual activity: Not on file  Lifestyle  . Physical activity:     Days per week: Not on file    Minutes per session: Not on file  . Stress: Not on file  Relationships  . Social connections:    Talks on phone: Not on file    Gets together: Not on file    Attends religious service: Not on file    Active member of club or organization: Not on file    Attends meetings of clubs or organizations: Not on file    Relationship status: Not on file  . Intimate partner violence:  Fear of current or ex partner: Not on file    Emotionally abused: Not on file    Physically abused: Not on file    Forced sexual activity: Not on file  Other Topics Concern  . Not on file  Social History Narrative  . Not on file     Vitals:   08/08/18 1017  BP: 115/80  Pulse: 89  SpO2: 90%  Weight: 181 lb (82.1 kg)  Height: 5\' 7"  (1.702 m)    Wt Readings from Last 3 Encounters:  08/08/18 181 lb (82.1 kg)  07/06/18 184 lb 11.9 oz (83.8 kg)  04/20/18 184 lb 12.8 oz (83.8 kg)     PHYSICAL EXAM General: NAD, using oxygen by nasal cannula HEENT: Normal. Neck: No JVD, no thyromegaly. Lungs: Diminished effort with no crackles or wheezes. CV: Regular rate and irregular rhythm, normal S1/S2, no S3, no murmur. No pretibial or periankle edema.    Abdomen: Soft, nontender, no distention.  Neurologic: Alert and oriented.  Psych: Normal affect. Skin: Normal. Musculoskeletal: No gross deformities.    ECG: Reviewed above under Subjective   Labs: Lab Results  Component Value Date/Time   K 4.6 07/06/2018 03:31 PM   BUN 28 (H) 07/06/2018 03:31 PM   CREATININE 2.29 (H) 07/06/2018 03:31 PM   ALT 12 07/06/2018 03:31 PM   TSH 1.525 12/26/2015 01:46 PM   TSH 3.852 11/25/2011 10:10 AM   HGB 9.3 (L) 07/06/2018 03:31 PM     Lipids: Lab Results  Component Value Date/Time   LDLCALC 75 07/27/2009   CHOL 132 07/27/2009   TRIG 69 07/27/2009   HDL 43 07/27/2009       ASSESSMENT AND PLAN:  1.  Chronic diastolic heart failure: Appears compensated.  Most recent  echocardiogram reviewed above.  Currently on torsemide 20 mg daily.  She takes an extra torsemide as needed for increased leg and feet swelling. Not on supplemental potassium.  I will repeat a basic metabolic panel.  2.  Longstanding persistent atrial fibrillation/flutter: Symptomatically stable.  Heart rate is controlled on metoprolol tartrate 12.5 mg twice daily.  Systemically anticoagulated with warfarin.  No changes to therapy.  3.  Chronic kidney disease stage III: Labs reviewed above with elevated creatinine.  I will repeat a basic metabolic panel.  4.  Chest pain, shortness of breath, and syncope: She has chest discomfort which may be due to GERD but given her episode of syncope and increased exertional fatigue and dyspnea, ischemic heart disease will need to be ruled out. I will proceed with a nuclear myocardial perfusion imaging study to evaluate for ischemic heart disease (Lexiscan Myoview). Her PCP has ordered an MRI of her brain to be performed on 08/11/2018.   Disposition: Follow up 3 months   Kate Sable, M.D., F.A.C.C.

## 2018-08-08 NOTE — Patient Instructions (Signed)
Medication Instructions:  Your physician recommends that you continue on your current medications as directed. Please refer to the Current Medication list given to you today.  If you need a refill on your cardiac medications before your next appointment, please call your pharmacy.   Lab work: Probation officer today  If you have labs (blood work) drawn today and your tests are completely normal, you will receive your results only by: Marland Kitchen MyChart Message (if you have MyChart) OR . A paper copy in the mail If you have any lab test that is abnormal or we need to change your treatment, we will call you to review the results.  Testing/Procedures: Your physician has requested that you have a lexiscan myoview. For further information please visit [email or web address]. Please follow instruction sheet, as given.   Follow-Up: At [facility], you and your health needs are our priority.  As part of our continuing mission to provide you with exceptional heart care, we have created designated Provider Care Teams.  These Care Teams include your primary Cardiologist (physician) and Advanced Practice Providers (APPs -  Physician Assistants and Nurse Practitioners) who all work together to provide you with the care you need, when you need it. . 10 weeks with Dr.Koneswaran  Any Other Special Instructions Will Be Listed Below (If Applicable).NONE        Thank you for choosing [facility] !

## 2018-08-09 DIAGNOSIS — Z7901 Long term (current) use of anticoagulants: Secondary | ICD-10-CM | POA: Diagnosis not present

## 2018-08-10 ENCOUNTER — Other Ambulatory Visit: Payer: Self-pay

## 2018-08-10 ENCOUNTER — Encounter (HOSPITAL_COMMUNITY): Payer: Self-pay | Admitting: *Deleted

## 2018-08-10 NOTE — Progress Notes (Signed)
SDW-Pre-op call completed by pt niece, [name] ([place]). [name] denies that pt C/O SOB and chest pain. [name] stated that pt is under the care of Dr. Bronson Ing, Cardiology. [name] denies that pt had a stress test and cardiac cath. Denies made aware to have pt stop taking vitamins and herbal medications. [name] verbalized understanding of all pre-op instructions. See anesthesia note

## 2018-08-10 NOTE — Progress Notes (Signed)
Anesthesia Chart Review: SAME DAY WORKUP   Case:  [id number] Date/Time:  08/11/18 0945   Procedure:  MRI of the BRAIN WITH ANESTHESIA WITHOUT CONTRAST (N/A )   Anesthesia type:  General   Pre-op diagnosis:  epilepsy, epileptic syndrome with seizures   Location:  [place] [facility] [place] [facility]   Surgeon:  Radiologist, Medication, MD      DISCUSSION: 77 yo feamle never smoker. Pertinent hx includes Afib on chronic anticoagulation, HFpEF, GERD, DOE, Anxiety, HTN, Gout, Anemia, CKD.  Echocardiogram performed on 04/12/2018 showed normal left ventricular systolic function and regional wall motion, LVEF 55 to 01%, diastolic dysfunction with high ventricular filling pressures, and mild aortic, mild mitral, and moderate tricuspid regurgitation.  Pt was seen by Dr. Bronson Ing 08/08/2018 and he recommended Lexiscan to rule out ischemia given her recent progression of fatigue and DOE. Lexiscan scheduled 08/19/18. I spoke to Dr. Bronson Ing 08/10/18 to confirm it was okay with him to proceed with MRI under GA prior to having Lexiscan. He advised it was fine to proceed with procedure tomorrow.  Anticipate she can proceed as planned barring acute status change.  VS: There were no vitals taken for this visit.  PROVIDERS: Doree Albee, MD is PCP  Kate Sable, MD is Cardiologist  LABS: Most recent labs displayed below. Elevated creatinine 1.45 c/w pt's hx of CKD.  Lab Results  Component Value Date   WBC 5.7 07/06/2018   HGB 9.3 (L) 07/06/2018   HCT 30.5 (L) 07/06/2018   PLT 173 07/06/2018   GLUCOSE 86 08/08/2018   CHOL 132 07/27/2009   TRIG 69 07/27/2009   HDL 43 07/27/2009   LDLCALC 75 07/27/2009   ALT 12 07/06/2018   AST 17 07/06/2018   NA 136 08/08/2018   K 4.6 08/08/2018   CL 97 (L) 08/08/2018   CREATININE 1.45 (H) 08/08/2018   BUN 17 08/08/2018   CO2 30 08/08/2018   Labs Reviewed - No data to display   IMAGES: PORTABLE CHEST 1 VIEW 07/06/2018  COMPARISON:  PA and lateral  chest 04/11/2018. Single-view of the chest 09/12/2007.  FINDINGS: There is cardiomegaly and pulmonary edema. No pneumothorax. Likely left pleural effusion is noted. No acute or focal bony abnormality.  IMPRESSION: Congestive heart failure.  EKG: 07/06/2018: Afib vent rate 105  CV: TTE 04/12/2018: Study Conclusions  - Procedure narrative: Transthoracic echocardiography. Image   quality was adequate. Definity (contrast) was employed. - Left ventricle: The cavity size was normal. Wall thickness was   normal. Systolic function was normal. The estimated ejection   fraction was in the range of 55% to 60%. Wall motion was normal;   there were no regional wall motion abnormalities. Findings   consistent with left ventricular diastolic dysfunction, grade   indeterminate. Doppler parameters are consistent with high   ventricular filling pressure. - Aortic valve: Mildly calcified annulus. Trileaflet. There was   mild regurgitation. - Mitral valve: Mildly calcified annulus. Normal thickness leaflets   . There was mild regurgitation. - Right ventricle: Systolic function was reduced. - Tricuspid valve: There was moderate regurgitation. - Pulmonary arteries: PA peak pressure: 46 mm Hg (S).  Past Medical History:  Diagnosis Date  . Adenomatous polyps    -resected via colonoscopy in 2011  . Anemia   . Anxiety    MR  . Atrial fibrillation (HCC)    EF of 45%  . Degenerative joint disease    status post right total hip arthroplasty  . Dysrhythmia   .  GERD (gastroesophageal reflux disease)    -Barrett's esophagus  . Glaucoma   . Glaucoma   . Gout   . Hyperlipidemia    Recent lipid profile is excellent without lipid-lowering therapy  . Hypertension   . Mental retardation   . Premature ventricular contractions   . Restless leg syndrome   . Shortness of breath dyspnea     Past Surgical History:  Procedure Laterality Date  . CATARACT EXTRACTION  2006   left eye  . COLONOSCOPY  W/ POLYPECTOMY  2011  . ESOPHAGOGASTRODUODENOSCOPY N/A 01/01/2016   Procedure: ESOPHAGOGASTRODUODENOSCOPY (EGD);  Surgeon: Rogene Houston, MD;  [facility];  Service: Endoscopy;  Laterality: N/A;  200  . EYE SURGERY    . JOINT REPLACEMENT     x2  . RADIOLOGY WITH ANESTHESIA Right 11/21/2015   Procedure: MRI OF RIGHT KNEE WITHOUT CONTAST    (RADIOLOGY WITH ANESTHESIA);  Surgeon: Medication Radiologist, MD;  Location: [place];  Service: Radiology;  Laterality: Right;  . TOTAL HIP ARTHROPLASTY  2006   Right    MEDICATIONS: No current facility-administered medications for this encounter.    Marland Kitchen acetaminophen (TYLENOL) 500 MG tablet  . allopurinol (ZYLOPRIM) 100 MG tablet  . Brinzolamide-Brimonidine (SIMBRINZA) 1-0.2 % SUSP  . Cholecalciferol (VITAMIN D3) 5000 units CAPS  . esomeprazole (NEXIUM) 20 MG packet  . fluticasone (FLONASE) 50 MCG/ACT nasal spray  . gabapentin (NEURONTIN) 300 MG capsule  . guaiFENesin-dextromethorphan (ROBITUSSIN DM) 100-10 MG/5ML syrup  . latanoprost (XALATAN) 0.005 % ophthalmic solution  . loratadine (CLARITIN) 10 MG tablet  . meclizine (ANTIVERT) 12.5 MG tablet  . metoprolol tartrate (LOPRESSOR) 25 MG tablet  . neomycin-polymyxin-hydrocortisone (CORTISPORIN) 3.5-10000-1 otic suspension  . ondansetron (ZOFRAN-ODT) 4 MG disintegrating tablet  . potassium chloride SA (K-DUR,KLOR-CON) 20 MEQ tablet  . thyroid (ARMOUR) 90 MG tablet  . torsemide (DEMADEX) 20 MG tablet  . traZODone (DESYREL) 100 MG tablet  . warfarin (COUMADIN) 3 MG tablet     Wynonia Musty [facility] Stay [facility]/Anesthesiology Phone [phone] 08/10/2018 8:49 AM

## 2018-08-11 ENCOUNTER — Ambulatory Visit (HOSPITAL_COMMUNITY)
Admission: RE | Admit: 2018-08-11 | Discharge: 2018-08-11 | Disposition: A | Discharge status: Home / Self Care | Payer: Medicare Other | Source: Ambulatory Visit | Attending: Internal Medicine | Admitting: Internal Medicine

## 2018-08-11 ENCOUNTER — Ambulatory Visit (HOSPITAL_COMMUNITY): Payer: Medicare Other | Admitting: Physician Assistant

## 2018-08-11 ENCOUNTER — Encounter (HOSPITAL_COMMUNITY)
Admission: RE | Disposition: A | Discharge status: Home / Self Care | Payer: Self-pay | Source: Ambulatory Visit | Attending: Internal Medicine

## 2018-08-11 DIAGNOSIS — Z7951 Long term (current) use of inhaled steroids: Secondary | ICD-10-CM | POA: Diagnosis not present

## 2018-08-11 DIAGNOSIS — I130 Hypertensive heart and chronic kidney disease with heart failure and stage 1 through stage 4 chronic kidney disease, or unspecified chronic kidney disease: Secondary | ICD-10-CM | POA: Insufficient documentation

## 2018-08-11 DIAGNOSIS — Z96641 Presence of right artificial hip joint: Secondary | ICD-10-CM | POA: Insufficient documentation

## 2018-08-11 DIAGNOSIS — N189 Chronic kidney disease, unspecified: Secondary | ICD-10-CM | POA: Insufficient documentation

## 2018-08-11 DIAGNOSIS — I5032 Chronic diastolic (congestive) heart failure: Secondary | ICD-10-CM | POA: Insufficient documentation

## 2018-08-11 DIAGNOSIS — F79 Unspecified intellectual disabilities: Secondary | ICD-10-CM | POA: Insufficient documentation

## 2018-08-11 DIAGNOSIS — Z79890 Hormone replacement therapy (postmenopausal): Secondary | ICD-10-CM | POA: Diagnosis not present

## 2018-08-11 DIAGNOSIS — G40009 Localization-related (focal) (partial) idiopathic epilepsy and epileptic syndromes with seizures of localized onset, not intractable, without status epilepticus: Secondary | ICD-10-CM

## 2018-08-11 DIAGNOSIS — Q761 Klippel-Feil syndrome: Secondary | ICD-10-CM | POA: Diagnosis not present

## 2018-08-11 DIAGNOSIS — K219 Gastro-esophageal reflux disease without esophagitis: Secondary | ICD-10-CM | POA: Diagnosis not present

## 2018-08-11 DIAGNOSIS — I4891 Unspecified atrial fibrillation: Secondary | ICD-10-CM | POA: Insufficient documentation

## 2018-08-11 DIAGNOSIS — M109 Gout, unspecified: Secondary | ICD-10-CM | POA: Insufficient documentation

## 2018-08-11 DIAGNOSIS — I110 Hypertensive heart disease with heart failure: Secondary | ICD-10-CM | POA: Diagnosis not present

## 2018-08-11 DIAGNOSIS — Q7649 Other congenital malformations of spine, not associated with scoliosis: Secondary | ICD-10-CM | POA: Diagnosis not present

## 2018-08-11 DIAGNOSIS — Z7901 Long term (current) use of anticoagulants: Secondary | ICD-10-CM | POA: Insufficient documentation

## 2018-08-11 DIAGNOSIS — G40909 Epilepsy, unspecified, not intractable, without status epilepticus: Secondary | ICD-10-CM | POA: Diagnosis not present

## 2018-08-11 DIAGNOSIS — H409 Unspecified glaucoma: Secondary | ICD-10-CM | POA: Diagnosis not present

## 2018-08-11 DIAGNOSIS — R9082 White matter disease, unspecified: Secondary | ICD-10-CM | POA: Diagnosis not present

## 2018-08-11 DIAGNOSIS — G319 Degenerative disease of nervous system, unspecified: Secondary | ICD-10-CM | POA: Diagnosis not present

## 2018-08-11 DIAGNOSIS — Z79899 Other long term (current) drug therapy: Secondary | ICD-10-CM | POA: Diagnosis not present

## 2018-08-11 DIAGNOSIS — R55 Syncope and collapse: Secondary | ICD-10-CM | POA: Diagnosis not present

## 2018-08-11 DIAGNOSIS — Z23 Encounter for immunization: Secondary | ICD-10-CM | POA: Diagnosis not present

## 2018-08-11 DIAGNOSIS — I5033 Acute on chronic diastolic (congestive) heart failure: Secondary | ICD-10-CM | POA: Diagnosis not present

## 2018-08-11 HISTORY — DX: Heart failure, unspecified: I50.9

## 2018-08-11 HISTORY — DX: Pneumonia, unspecified organism: J18.9

## 2018-08-11 HISTORY — DX: Presence of spectacles and contact lenses: Z97.3

## 2018-08-11 HISTORY — PX: RADIOLOGY WITH ANESTHESIA: SHX6223

## 2018-08-11 HISTORY — DX: Presence of dental prosthetic device (complete) (partial): Z97.2

## 2018-08-11 HISTORY — DX: Chronic kidney disease, unspecified: N18.9

## 2018-08-11 LAB — CBC
HCT: 30.9 % — ABNORMAL LOW (ref 36.0–46.0)
Hemoglobin: 8.9 g/dL — ABNORMAL LOW (ref 12.0–15.0)
MCH: 26 pg (ref 26.0–34.0)
MCHC: 28.8 g/dL — ABNORMAL LOW (ref 30.0–36.0)
MCV: 90.4 fL (ref 80.0–100.0)
NRBC: 0 % (ref 0.0–0.2)
PLATELETS: 161 10*3/uL (ref 150–400)
RBC: 3.42 MIL/uL — AB (ref 3.87–5.11)
RDW: 19.8 % — AB (ref 11.5–15.5)
WBC: 5.3 10*3/uL (ref 4.0–10.5)

## 2018-08-11 SURGERY — MRI WITH ANESTHESIA
Anesthesia: General

## 2018-08-11 MED ORDER — FENTANYL CITRATE (PF) 100 MCG/2ML IJ SOLN: 25.0000 ug | INTRAMUSCULAR | Status: DC | PRN

## 2018-08-11 MED ORDER — LACTATED RINGERS IV SOLN
INTRAVENOUS | Status: DC
  Administered 2018-08-11: 09:00:00 via INTRAVENOUS

## 2018-08-11 NOTE — Anesthesia Preprocedure Evaluation (Addendum)
Anesthesia Evaluation  Patient identified by MRN, date of birth, ID band Patient awake    Reviewed: Allergy & Precautions, H&P , NPO status , Patient's Chart, lab work & pertinent test results, reviewed documented beta blocker date and time   Airway Mallampati: II  TM Distance: >3 FB Neck ROM: Full    Dental no notable dental hx. (+) Edentulous Upper, Edentulous Lower, Dental Advisory Given   Pulmonary neg pulmonary ROS,    Pulmonary exam normal breath sounds clear to auscultation       Cardiovascular hypertension, Pt. on medications and Pt. on home beta blockers +CHF  + dysrhythmias Atrial Fibrillation  Rhythm:Irregular Rate:Normal     Neuro/Psych Anxiety negative neurological ROS     GI/Hepatic Neg liver ROS, GERD  Medicated and Controlled,  Endo/Other  negative endocrine ROS  Renal/GU negative Renal ROS  negative genitourinary   Musculoskeletal  (+) Arthritis , Osteoarthritis,    Abdominal   Peds  Hematology  (+) Blood dyscrasia, anemia ,   Anesthesia Other Findings   Reproductive/Obstetrics negative OB ROS                            Anesthesia Physical Anesthesia Plan  ASA: III  Anesthesia Plan: General   Post-op Pain Management:    Induction: Intravenous  PONV Risk Score and Plan: 3 and Ondansetron, Dexamethasone and Treatment may vary due to age or medical condition  Airway Management Planned: Oral ETT  Additional Equipment:   Intra-op Plan:   Post-operative Plan: Extubation in OR  Informed Consent: I have reviewed the patients History and Physical, chart, labs and discussed the procedure including the risks, benefits and alternatives for the proposed anesthesia with the patient or authorized representative who has indicated his/her understanding and acceptance.   Dental advisory given  Plan Discussed with: CRNA  Anesthesia Plan Comments:        Anesthesia  Quick Evaluation

## 2018-08-11 NOTE — Transfer of Care (Signed)
Immediate Anesthesia Transfer of Care Note  Patient: [name]  Procedure(s) Performed: MRI of the BRAIN WITH ANESTHESIA WITHOUT CONTRAST (N/A )  Patient Location: PACU  Anesthesia Type:General  Level of Consciousness: awake, alert , oriented and patient cooperative  Airway & Oxygen Therapy: Patient Spontanous Breathing and Patient connected to nasal cannula oxygen  Post-op Assessment: Report given to RN, Post -op Vital signs reviewed and stable and Patient moving all extremities  Post vital signs: Reviewed and stable  Last Vitals:  Vitals Value Taken Time  BP 131/82 08/11/2018 12:00 PM  Temp 36.4 C 08/11/2018 12:00 PM  Pulse 77 08/11/2018 12:06 PM  Resp 15 08/11/2018 12:06 PM  SpO2 96 % 08/11/2018 12:06 PM  Vitals shown include unvalidated device data.  Last Pain:  Vitals:   08/11/18 1200  TempSrc:   PainSc: 0-No pain      Patients Stated Pain Goal: 2 (70/48/88 9169)  Complications: No apparent anesthesia complications

## 2018-08-11 NOTE — Anesthesia Postprocedure Evaluation (Signed)
Anesthesia Post Note  Patient: [name]  Procedure(s) Performed: MRI of the BRAIN WITH ANESTHESIA WITHOUT CONTRAST (N/A )     Patient location during evaluation: PACU Anesthesia Type: General Level of consciousness: awake and alert Pain management: pain level controlled Vital Signs Assessment: post-procedure vital signs reviewed and stable Respiratory status: spontaneous breathing, nonlabored ventilation, respiratory function stable and patient connected to nasal cannula oxygen Cardiovascular status: blood pressure returned to baseline and stable Postop Assessment: no apparent nausea or vomiting Anesthetic complications: no    Last Vitals:  Vitals:   08/11/18 1227 08/11/18 1231  BP: (!) 141/80 139/86  Pulse: 77 81  Resp: 17   Temp: 36.5 C   SpO2: 100% 100%    Last Pain:  Vitals:   08/11/18 1231  TempSrc:   PainSc: 0-No pain                 Hazelene Doten,W. EDMOND

## 2018-08-12 ENCOUNTER — Encounter (HOSPITAL_COMMUNITY): Payer: Self-pay | Admitting: Radiology

## 2018-08-12 MED FILL — Lidocaine HCl(Cardiac) IV PF Soln Pref Syr 100 MG/5ML (2%): INTRAVENOUS | Qty: 5 | Status: AC

## 2018-08-12 MED FILL — Phenylephrine-NaCl Pref Syr 0.4 MG/10ML-0.9% (40 MCG/ML): INTRAVENOUS | Qty: 10 | Status: AC

## 2018-08-12 MED FILL — Propofol IV Emul 200 MG/20ML (10 MG/ML): INTRAVENOUS | Qty: 20 | Status: AC

## 2018-08-12 MED FILL — Rocuronium Bromide IV Soln 50 MG/5ML (10 MG/ML): INTRAVENOUS | Qty: 5 | Status: AC

## 2018-08-12 MED FILL — Sugammadex Sodium IV 200 MG/2ML (Base Equivalent): INTRAVENOUS | Qty: 2 | Status: AC

## 2018-08-12 MED FILL — Fentanyl Citrate Preservative Free (PF) Inj 100 MCG/2ML: INTRAMUSCULAR | Qty: 2 | Status: AC

## 2018-08-12 MED FILL — Ondansetron HCl Inj 4 MG/2ML (2 MG/ML): INTRAMUSCULAR | Qty: 2 | Status: AC

## 2018-08-16 DIAGNOSIS — Z7901 Long term (current) use of anticoagulants: Secondary | ICD-10-CM | POA: Diagnosis not present

## 2018-08-19 ENCOUNTER — Encounter (HOSPITAL_COMMUNITY)
Admission: RE | Admit: 2018-08-19 | Discharge: 2018-08-19 | Disposition: A | Discharge status: Home / Self Care | Payer: Medicare Other | Source: Ambulatory Visit | Attending: Cardiovascular Disease | Admitting: Cardiovascular Disease

## 2018-08-19 ENCOUNTER — Encounter (HOSPITAL_COMMUNITY): Payer: Medicare Other

## 2018-08-19 DIAGNOSIS — R079 Chest pain, unspecified: Secondary | ICD-10-CM | POA: Diagnosis not present

## 2018-08-19 DIAGNOSIS — R55 Syncope and collapse: Secondary | ICD-10-CM | POA: Diagnosis not present

## 2018-08-19 LAB — NM MYOCAR MULTI W/SPECT W/WALL MOTION / EF
CHL CUP NUCLEAR SSS: 8
CSEPPHR: 123 {beats}/min
LV sys vol: 36 mL
LVDIAVOL: 72 mL (ref 46–106)
NUC STRESS TID: 1.06
RATE: 0.48
Rest HR: 81 {beats}/min
SDS: 4
SRS: 4

## 2018-08-19 MED ORDER — TECHNETIUM TC 99M TETROFOSMIN IV KIT
30.0000 | PACK | Freq: Once | INTRAVENOUS | Status: AC | PRN
  Administered 2018-08-19: 33 via INTRAVENOUS

## 2018-08-19 MED ORDER — REGADENOSON 0.4 MG/5ML IV SOLN
INTRAVENOUS | Status: AC
  Administered 2018-08-19: 0.4 mg via INTRAVENOUS
  Filled 2018-08-19: qty 5

## 2018-08-19 MED ORDER — TECHNETIUM TC 99M TETROFOSMIN IV KIT
10.0000 | PACK | Freq: Once | INTRAVENOUS | Status: AC | PRN
  Administered 2018-08-19: 10.7 via INTRAVENOUS

## 2018-08-19 MED ORDER — SODIUM CHLORIDE 0.9% FLUSH
INTRAVENOUS | Status: AC
  Administered 2018-08-19: 10 mL via INTRAVENOUS
  Filled 2018-08-19: qty 10

## 2018-08-22 DIAGNOSIS — H401133 Primary open-angle glaucoma, bilateral, severe stage: Secondary | ICD-10-CM | POA: Diagnosis not present

## 2018-09-06 DIAGNOSIS — Z7901 Long term (current) use of anticoagulants: Secondary | ICD-10-CM | POA: Diagnosis not present

## 2018-09-14 ENCOUNTER — Emergency Department (HOSPITAL_COMMUNITY)
Admission: EM | Admit: 2018-09-14 | Discharge: 2018-09-14 | Disposition: A | Discharge status: Home / Self Care | Payer: Medicare Other | Attending: Emergency Medicine | Admitting: Emergency Medicine

## 2018-09-14 ENCOUNTER — Emergency Department (HOSPITAL_COMMUNITY): Payer: Medicare Other

## 2018-09-14 ENCOUNTER — Encounter (HOSPITAL_COMMUNITY): Payer: Self-pay | Admitting: Emergency Medicine

## 2018-09-14 ENCOUNTER — Other Ambulatory Visit: Payer: Self-pay

## 2018-09-14 DIAGNOSIS — Z79899 Other long term (current) drug therapy: Secondary | ICD-10-CM | POA: Insufficient documentation

## 2018-09-14 DIAGNOSIS — R062 Wheezing: Secondary | ICD-10-CM | POA: Diagnosis not present

## 2018-09-14 DIAGNOSIS — N183 Chronic kidney disease, stage 3 (moderate): Secondary | ICD-10-CM | POA: Diagnosis not present

## 2018-09-14 DIAGNOSIS — Z96649 Presence of unspecified artificial hip joint: Secondary | ICD-10-CM | POA: Insufficient documentation

## 2018-09-14 DIAGNOSIS — Z7901 Long term (current) use of anticoagulants: Secondary | ICD-10-CM | POA: Diagnosis not present

## 2018-09-14 DIAGNOSIS — R0989 Other specified symptoms and signs involving the circulatory and respiratory systems: Secondary | ICD-10-CM | POA: Diagnosis not present

## 2018-09-14 DIAGNOSIS — F79 Unspecified intellectual disabilities: Secondary | ICD-10-CM | POA: Insufficient documentation

## 2018-09-14 DIAGNOSIS — I509 Heart failure, unspecified: Secondary | ICD-10-CM | POA: Diagnosis not present

## 2018-09-14 DIAGNOSIS — I130 Hypertensive heart and chronic kidney disease with heart failure and stage 1 through stage 4 chronic kidney disease, or unspecified chronic kidney disease: Secondary | ICD-10-CM | POA: Insufficient documentation

## 2018-09-14 DIAGNOSIS — Z96641 Presence of right artificial hip joint: Secondary | ICD-10-CM | POA: Insufficient documentation

## 2018-09-14 NOTE — ED Provider Notes (Signed)
[facility] EMERGENCY DEPARTMENT Provider Note   CSN: [id number] Arrival date & time: 09/14/18  1804     History   Chief Complaint Chief Complaint  Patient presents with  . Wheezing    HPI [name] is a 77 y.o. female.  HPI Presents with concern of wheezing. Patient has multiple medical issues including mental retardation, but has been in her usual state of health. Today, a home health nurse found the patient have wheezing, without new fever, without other complaints according the patient's sister After discussion with the patient's primary care physician she was sent here for evaluation. The patient herself only requests to go home, denies pain, but her retardation prohibits substantial involvement in the HPI, level 5 caveat secondary to cognitive condition. History provided by the patient's sister who arrives with her. Past Medical History:  Diagnosis Date  . Adenomatous polyps    -resected via colonoscopy in 2011  . Anemia   . Anxiety    MR  . Atrial fibrillation (HCC)    EF of 45%  . CHF (congestive heart failure) ([place])   . Chronic kidney disease   . Degenerative joint disease    status post right total hip arthroplasty  . Dysrhythmia   . GERD (gastroesophageal reflux disease)    -Barrett's esophagus  . Glaucoma   . Glaucoma   . Gout   . Hyperlipidemia    Recent lipid profile is excellent without lipid-lowering therapy  . Hypertension   . Mental retardation   . Pneumonia   . Premature ventricular contractions   . Restless leg syndrome   . Shortness of breath dyspnea   . Wears dentures   . Wears glasses     Patient Active Problem List   Diagnosis Date Noted  . CHF, acute on chronic ([place]) 04/11/2018  . Atrial fibrillation with RVR ([place]) 04/11/2018  . Electrolyte depletion 09/18/2017  . Elevated alkaline phosphatase level 09/18/2017  . Acute renal failure superimposed on chronic kidney disease ([place])   . Elevated LFTs   . Hypomagnesemia   .  Mental retardation   . Hypokalemia 09/11/2017  . Hyponatremia 09/11/2017  . Acute renal injury ([place]) 09/11/2017  . Dehydration 09/11/2017  . Supratherapeutic INR 09/11/2017  . Hyperbilirubinemia 09/11/2017  . Elevated troponin 09/11/2017  . Vulvovaginitis due to yeast 09/11/2017  . Syncope 12/26/2015  . Chronic anticoagulation 12/20/2012  . Chronic kidney disease, stage 3 ([place]) 12/20/2012  . Anemia, normocytic normochromic 12/20/2012  . History of diagnostic tests 12/20/2012  . Acute on chronic diastolic CHF (congestive heart failure) ([place]) 11/25/2011  . Atrial fibrillation ([place])   . Degenerative joint disease   . GERD (gastroesophageal reflux disease)   . Gout   . Adenomatous polyps   . MENTAL RETARDATION 07/30/2009  . GLAUCOMA 06/25/2009    Past Surgical History:  Procedure Laterality Date  . CATARACT EXTRACTION  2006   left eye  . COLONOSCOPY W/ POLYPECTOMY  2011  . ESOPHAGOGASTRODUODENOSCOPY N/A 01/01/2016   Procedure: ESOPHAGOGASTRODUODENOSCOPY (EGD);  Surgeon: Rogene Houston, MD;  [facility];  Service: Endoscopy;  Laterality: N/A;  200  . EYE SURGERY    . JOINT REPLACEMENT     x2  . MULTIPLE TOOTH EXTRACTIONS    . RADIOLOGY WITH ANESTHESIA Right 11/21/2015   Procedure: MRI OF RIGHT KNEE WITHOUT CONTAST    (RADIOLOGY WITH ANESTHESIA);  Surgeon: Medication Radiologist, MD;  Location: [place];  Service: Radiology;  Laterality: Right;  . RADIOLOGY WITH ANESTHESIA N/A  08/11/2018   Procedure: MRI of the BRAIN WITH ANESTHESIA WITHOUT CONTRAST;  Surgeon: Radiologist, Medication, MD;  Location: [place];  Service: Radiology;  Laterality: N/A;  . TONSILLECTOMY    . TOTAL HIP ARTHROPLASTY  2006   Right     OB History   None      Home Medications    Prior to Admission medications   Medication Sig Start Date End Date Taking? Authorizing Provider  acetaminophen (TYLENOL) 500 MG tablet Take 500-1,000 mg by mouth 2 (two) times daily as needed for mild pain or  moderate pain.     [provider]  allopurinol (ZYLOPRIM) 100 MG tablet Take 100 mg by mouth at bedtime.     [provider]  Brinzolamide-Brimonidine (SIMBRINZA) 1-0.2 % SUSP Place 1 drop into both eyes 2 (two) times daily.     [provider]  Cholecalciferol (VITAMIN D3) 5000 units CAPS Take 5,000 Units by mouth daily.     [provider]  esomeprazole (NEXIUM) 20 MG packet Take 20 mg by mouth daily.    [provider]  fluticasone (FLONASE) 50 MCG/ACT nasal spray Place 1 spray into both nostrils daily.    [provider]  gabapentin (NEURONTIN) 300 MG capsule Take 300 mg at bedtime by mouth. 08/23/17   [provider]  guaiFENesin-dextromethorphan (ROBITUSSIN DM) 100-10 MG/5ML syrup Take 5 mLs by mouth every 4 (four) hours as needed for cough.    [provider]  latanoprost (XALATAN) 0.005 % ophthalmic solution Place 1 drop into both eyes at bedtime.      [provider]  loratadine (CLARITIN) 10 MG tablet Take 10 mg by mouth daily.     [provider]  meclizine (ANTIVERT) 12.5 MG tablet Take 12.5 mg by mouth 3 (three) times daily as needed for dizziness.    [provider]  metoprolol tartrate (LOPRESSOR) 25 MG tablet Take 0.5 tablets (12.5 mg total) by mouth 2 (two) times daily. 12/20/17 08/08/18  Herminio Commons, MD  neomycin-polymyxin-hydrocortisone (CORTISPORIN) 3.5-10000-1 otic suspension Place 2-3 drops into both ears 2 (two) times daily as needed (ear pain).     [provider]  ondansetron (ZOFRAN-ODT) 4 MG disintegrating tablet Take 4 mg by mouth every 4 (four) hours as needed for nausea or vomiting.  03/17/18   [provider]  potassium chloride SA (K-DUR,KLOR-CON) 20 MEQ tablet Take 20 mEq by mouth 2 (two) times daily.     [provider]  thyroid (ARMOUR) 90 MG tablet Take 90 mg by mouth daily before breakfast.     [provider]  torsemide  (DEMADEX) 20 MG tablet Take 1 tablet (20 mg total) by mouth 2 (two) times daily. 05/20/18   Herminio Commons, MD  traZODone (DESYREL) 100 MG tablet Take 1 tablet (100 mg total) at bedtime as needed by mouth for sleep. Patient taking differently: Take 100 mg by mouth at bedtime.  09/18/17   Debbe Odea, MD  warfarin (COUMADIN) 3 MG tablet Take 3 mg by mouth daily.     [provider]    Family History History reviewed. No pertinent family history.  Social History Social History   Tobacco Use  . Smoking status: Never Smoker  . Smokeless tobacco: Never Used  Substance Use Topics  . Alcohol use: No  . Drug use: No     Allergies   Patient has no known allergies.   Review of Systems Review of Systems  Unable to perform ROS:  Other  Mental retardation   Physical Exam Updated Vital Signs BP 135/88 (BP Location: Right Arm)   Pulse 79   Temp 97.7 F (36.5 C) (Oral)   Resp 18   Ht 5\' 8"  (1.727 m)   Wt 83.5 kg   SpO2 99%   BMI 27.98 kg/m   Physical Exam  Constitutional: She appears well-developed and well-nourished. No distress.  HENT:  Head: Normocephalic and atraumatic.  Eyes: Conjunctivae and EOM are normal.  Cardiovascular: Normal rate and regular rhythm.  Pulmonary/Chest: Effort normal. She has wheezes.  2 L via nasal cannula baseline according to family  Abdominal: She exhibits no distension.  Musculoskeletal: She exhibits no edema.  Neurological: She is alert. No cranial nerve deficit. She exhibits normal muscle tone.  Skin: Skin is warm and dry.  Psychiatric: Her mood appears anxious. Cognition and memory are impaired.  Nursing note and vitals reviewed.    ED Treatments / Results   Radiology Dg Chest 2 View  Result Date: 09/14/2018 CLINICAL DATA:  Wheezing. EXAM: CHEST - 2 VIEW COMPARISON:  July 06, 2018 FINDINGS: A large hiatal hernia is again identified. Cardiomegaly. The hila and mediastinum are unchanged. No pneumothorax. No  pulmonary nodules or masses. Mild increased interstitial markings suggest pulmonary venous congestion without overt edema. No focal infiltrate. IMPRESSION: Cardiomegaly and pulmonary venous congestion. Electronically Signed   By: Dorise Bullion III M.D   On: 09/14/2018 20:25    Procedures Procedures (including critical care time)    Initial Impression / Assessment and Plan / ED Course  I have reviewed the triage vital signs and the nursing notes.  Pertinent labs & imaging results that were available during my care of the patient were reviewed by me and considered in my medical decision making (see chart for details).  8:35 PM Patient in no distress, smiling, interacting in an animated fashion. We discussed the x-ray, no evidence for pneumonia, but some suggestion of cardiomegaly, pulmonary venous congestion, he given the patient's history of heart failure, audible wheezing, patient will of increased dose of diuretic for 3 days, will follow up with primary care purulent absent distress, without any focal complaints on the patient's part herself, with no hemodynamic instability, low suspicion for occult infection, no evidence for ACS, patient discharged in stable condition.  Final Clinical Impressions(s) / ED Diagnoses   Final diagnoses:  Wheezing     Carmin Muskrat, MD 09/14/18 2038

## 2018-09-14 NOTE — ED Triage Notes (Signed)
Patient seen by [facility] nurse this am. Wheezing heard on auscultation. Patient's doctor contacted and unable to get an appointment, sent to ED for CXR.

## 2018-09-14 NOTE — Discharge Instructions (Addendum)
As discussed, today's evaluation has been generally reassuring. There is some suspicion for wheezing due to your heart failure. For the next 3 days please take double your typical dose of diuretic, 40 mg torsemide daily. Please schedule follow-up visit with your primary care physician, or return here for any concerning changes in your condition.

## 2018-09-19 DIAGNOSIS — Z7901 Long term (current) use of anticoagulants: Secondary | ICD-10-CM | POA: Diagnosis not present

## 2018-10-04 DIAGNOSIS — Z7901 Long term (current) use of anticoagulants: Secondary | ICD-10-CM | POA: Diagnosis not present

## 2018-10-11 DIAGNOSIS — M79676 Pain in unspecified toe(s): Secondary | ICD-10-CM | POA: Diagnosis not present

## 2018-10-11 DIAGNOSIS — Z7901 Long term (current) use of anticoagulants: Secondary | ICD-10-CM | POA: Diagnosis not present

## 2018-10-11 DIAGNOSIS — I70203 Unspecified atherosclerosis of native arteries of extremities, bilateral legs: Secondary | ICD-10-CM | POA: Diagnosis not present

## 2018-10-11 DIAGNOSIS — B351 Tinea unguium: Secondary | ICD-10-CM | POA: Diagnosis not present

## 2018-10-18 DIAGNOSIS — G44009 Cluster headache syndrome, unspecified, not intractable: Secondary | ICD-10-CM | POA: Diagnosis not present

## 2018-10-18 DIAGNOSIS — I10 Essential (primary) hypertension: Secondary | ICD-10-CM | POA: Diagnosis not present

## 2018-10-18 DIAGNOSIS — N182 Chronic kidney disease, stage 2 (mild): Secondary | ICD-10-CM | POA: Diagnosis not present

## 2018-10-18 DIAGNOSIS — N183 Chronic kidney disease, stage 3 (moderate): Secondary | ICD-10-CM | POA: Diagnosis not present

## 2018-10-18 DIAGNOSIS — R42 Dizziness and giddiness: Secondary | ICD-10-CM | POA: Diagnosis not present

## 2018-10-18 DIAGNOSIS — R300 Dysuria: Secondary | ICD-10-CM | POA: Diagnosis not present

## 2018-10-20 ENCOUNTER — Ambulatory Visit (INDEPENDENT_AMBULATORY_CARE_PROVIDER_SITE_OTHER): Payer: Medicare Other | Admitting: Cardiovascular Disease

## 2018-10-20 ENCOUNTER — Encounter: Payer: Self-pay | Admitting: Cardiovascular Disease

## 2018-10-20 VITALS — BP 134/60 | HR 90 | Ht 68.0 in | Wt 188.6 lb

## 2018-10-20 DIAGNOSIS — I25118 Atherosclerotic heart disease of native coronary artery with other forms of angina pectoris: Secondary | ICD-10-CM | POA: Diagnosis not present

## 2018-10-20 DIAGNOSIS — I5032 Chronic diastolic (congestive) heart failure: Secondary | ICD-10-CM | POA: Diagnosis not present

## 2018-10-20 DIAGNOSIS — I4811 Longstanding persistent atrial fibrillation: Secondary | ICD-10-CM | POA: Diagnosis not present

## 2018-10-20 DIAGNOSIS — N183 Chronic kidney disease, stage 3 unspecified: Secondary | ICD-10-CM

## 2018-10-20 NOTE — Patient Instructions (Signed)

## 2018-10-20 NOTE — Progress Notes (Signed)
SUBJECTIVE: The patient presents for routine follow-up.  She was evaluated for wheezing in the ED on 09/14/2018.  Chest x-ray showed cardiomegaly and pulmonary venous congestion.  She was told to increase her diuretic for a few days.  Past medical history includes chronic diastolic heart failure, atrial flutter and fibrillation, and hypertension.  Echocardiogram performed on 04/12/2018 showed normal left ventricular systolic function and regional wall motion, LVEF 55 to 32%, diastolic dysfunction with high ventricular filling pressures, and mild aortic, mild mitral, and moderate tricuspid regurgitation.  She underwent a low risk nuclear stress test on 08/19/2018, EF 51%.  There appeared to be a small distal anteroseptal myocardial infarction with no evidence of ischemia.  She is doing well today and denies chest pain, leg swelling, orthopnea, and shortness of breath.  She is here with 2 other family members.  They tell me that she has been eating more junk food at night.  Weights have been stable at home and they have not noticed any increased leg swelling or abdominal distention.    Review of Systems: As per "subjective", otherwise negative.  No Known Allergies  Current Outpatient Medications  Medication Sig Dispense Refill  . acetaminophen (TYLENOL) 500 MG tablet Take 500-1,000 mg by mouth 2 (two) times daily as needed for mild pain or moderate pain.     Marland Kitchen allopurinol (ZYLOPRIM) 100 MG tablet Take 100 mg by mouth at bedtime.     . Brinzolamide-Brimonidine (SIMBRINZA) 1-0.2 % SUSP Place 1 drop into both eyes 2 (two) times daily.     . Cholecalciferol (VITAMIN D3) 5000 units CAPS Take 5,000 Units by mouth daily.     Marland Kitchen esomeprazole (NEXIUM) 20 MG packet Take 20 mg by mouth daily.    . fluticasone (FLONASE) 50 MCG/ACT nasal spray Place 1 spray into both nostrils daily.    Marland Kitchen gabapentin (NEURONTIN) 300 MG capsule Take 300 mg at bedtime by mouth.    Marland Kitchen guaiFENesin-dextromethorphan  (ROBITUSSIN DM) 100-10 MG/5ML syrup Take 5 mLs by mouth every 4 (four) hours as needed for cough.    . latanoprost (XALATAN) 0.005 % ophthalmic solution Place 1 drop into both eyes at bedtime.      Marland Kitchen loratadine (CLARITIN) 10 MG tablet Take 10 mg by mouth daily.     . meclizine (ANTIVERT) 12.5 MG tablet Take 12.5 mg by mouth 3 (three) times daily as needed for dizziness.    . neomycin-polymyxin-hydrocortisone (CORTISPORIN) 3.5-10000-1 otic suspension Place 2-3 drops into both ears 2 (two) times daily as needed (ear pain).     . ondansetron (ZOFRAN-ODT) 4 MG disintegrating tablet Take 4 mg by mouth every 4 (four) hours as needed for nausea or vomiting.   1  . potassium chloride SA (K-DUR,KLOR-CON) 20 MEQ tablet Take 20 mEq by mouth 2 (two) times daily.     Marland Kitchen thyroid (ARMOUR) 90 MG tablet Take 90 mg by mouth daily before breakfast.     . torsemide (DEMADEX) 20 MG tablet Take 1 tablet (20 mg total) by mouth 2 (two) times daily. 180 tablet 3  . traZODone (DESYREL) 100 MG tablet Take 1 tablet (100 mg total) at bedtime as needed by mouth for sleep. (Patient taking differently: Take 100 mg by mouth at bedtime. )    . warfarin (COUMADIN) 3 MG tablet Take 3 mg by mouth daily.     . metoprolol tartrate (LOPRESSOR) 25 MG tablet Take 0.5 tablets (12.5 mg total) by mouth 2 (two) times daily. 90 tablet 3  No current facility-administered medications for this visit.     Past Medical History:  Diagnosis Date  . Adenomatous polyps    -resected via colonoscopy in 2011  . Anemia   . Anxiety    MR  . Atrial fibrillation (HCC)    EF of 45%  . CHF (congestive heart failure) ([place])   . Chronic kidney disease   . Degenerative joint disease    status post right total hip arthroplasty  . Dysrhythmia   . GERD (gastroesophageal reflux disease)    -Barrett's esophagus  . Glaucoma   . Glaucoma   . Gout   . Hyperlipidemia    Recent lipid profile is excellent without lipid-lowering therapy  . Hypertension   .  Mental retardation   . Pneumonia   . Premature ventricular contractions   . Restless leg syndrome   . Shortness of breath dyspnea   . Wears dentures   . Wears glasses     Past Surgical History:  Procedure Laterality Date  . CATARACT EXTRACTION  2006   left eye  . COLONOSCOPY W/ POLYPECTOMY  2011  . ESOPHAGOGASTRODUODENOSCOPY N/A 01/01/2016   Procedure: ESOPHAGOGASTRODUODENOSCOPY (EGD);  Surgeon: Rogene Houston, MD;  [facility];  Service: Endoscopy;  Laterality: N/A;  200  . EYE SURGERY    . JOINT REPLACEMENT     x2  . MULTIPLE TOOTH EXTRACTIONS    . RADIOLOGY WITH ANESTHESIA Right 11/21/2015   Procedure: MRI OF RIGHT KNEE WITHOUT CONTAST    (RADIOLOGY WITH ANESTHESIA);  Surgeon: Medication Radiologist, MD;  Location: [place];  Service: Radiology;  Laterality: Right;  . RADIOLOGY WITH ANESTHESIA N/A 08/11/2018   Procedure: MRI of the BRAIN WITH ANESTHESIA WITHOUT CONTRAST;  Surgeon: Radiologist, Medication, MD;  Location: [place];  Service: Radiology;  Laterality: N/A;  . TONSILLECTOMY    . TOTAL HIP ARTHROPLASTY  2006   Right    Social History   Socioeconomic History  . Marital status: Single    Spouse name: Not on file  . Number of children: Not on file  . Years of education: Not on file  . Highest education level: Not on file  Occupational History  . Occupation: disabled  Social Needs  . Financial resource strain: Not on file  . Food insecurity:    Worry: Not on file    Inability: Not on file  . Transportation needs:    Medical: Not on file    Non-medical: Not on file  Tobacco Use  . Smoking status: Never Smoker  . Smokeless tobacco: Never Used  Substance and Sexual Activity  . Alcohol use: No  . Drug use: No  . Sexual activity: Not on file  Lifestyle  . Physical activity:    Days per week: Not on file    Minutes per session: Not on file  . Stress: Not on file  Relationships  . Social connections:    Talks on phone: Not on file    Gets together:  Not on file    Attends religious service: Not on file    Active member of club or organization: Not on file    Attends meetings of clubs or organizations: Not on file    Relationship status: Not on file  . Intimate partner violence:    Fear of current or ex partner: Not on file    Emotionally abused: Not on file    Physically abused: Not on file    Forced sexual activity: Not on file  Other Topics Concern  . Not on file  Social History Narrative  . Not on file     Vitals:   10/20/18 1103  BP: 134/60  Pulse: 90  SpO2: 99%  Weight: 188 lb 9.6 oz (85.5 kg)  Height: 5\' 8"  (1.727 m)    Wt Readings from Last 3 Encounters:  10/20/18 188 lb 9.6 oz (85.5 kg)  09/14/18 184 lb (83.5 kg)  08/11/18 181 lb (82.1 kg)     PHYSICAL EXAM General: NAD, using oxygen by nasal cannula HEENT: Normal. Neck: No JVD, no thyromegaly. Lungs: Clear to auscultation bilaterally with normal respiratory effort. CV: Regular rate and irregular rhythm, normal S1/S2, no S3, no murmur. No pretibial or periankle edema. Abdomen: Soft, nontender, no distention.  Neurologic: Alert and oriented.  Psych: Normal affect. Skin: Normal. Musculoskeletal: No gross deformities.    ECG: Reviewed above under Subjective   Labs: Lab Results  Component Value Date/Time   K 4.6 08/08/2018 11:25 AM   BUN 17 08/08/2018 11:25 AM   CREATININE 1.45 (H) 08/08/2018 11:25 AM   ALT 12 07/06/2018 03:31 PM   TSH 1.525 12/26/2015 01:46 PM   TSH 3.852 11/25/2011 10:10 AM   HGB 8.9 (L) 08/11/2018 09:00 AM     Lipids: Lab Results  Component Value Date/Time   LDLCALC 75 07/27/2009   CHOL 132 07/27/2009   TRIG 69 07/27/2009   HDL 43 07/27/2009       ASSESSMENT AND PLAN:  1. Chronic diastolic heart failure: Weight up 7 pounds from 08/11/2018 on our scales but they weigh her regularly at home and weights are stable. Most recent echocardiogram reviewed above. Currently on torsemide 20 mg daily.  She takes an extra  torsemide as needed for increased leg and feet swelling.Not on supplemental potassium.   2.Longstanding persistent atrial fibrillation/flutter: Symptomatically stable. Heart rate is controlled on metoprolol tartrate 12.5 mg twice daily. Systemically anticoagulated with warfarin. No changes to therapy.  3. Chronic kidney disease stage III:  Creatinine 1.45 on 08/08/2018.  Monitored by PCP.  4.    Coronary artery disease: Nuclear stress test reviewed above with evidence of small prior MI.  No aspirin as she is on warfarin.  She is also on a beta-blocker.   Disposition: Follow up 6 months   Kate Sable, M.D., F.A.C.C.

## 2018-11-01 DIAGNOSIS — Z7901 Long term (current) use of anticoagulants: Secondary | ICD-10-CM | POA: Diagnosis not present

## 2018-11-10 DIAGNOSIS — Z7901 Long term (current) use of anticoagulants: Secondary | ICD-10-CM | POA: Diagnosis not present

## 2018-11-17 ENCOUNTER — Other Ambulatory Visit: Payer: Self-pay | Admitting: Cardiovascular Disease

## 2018-11-23 DIAGNOSIS — F71 Moderate intellectual disabilities: Secondary | ICD-10-CM | POA: Diagnosis not present

## 2018-11-23 DIAGNOSIS — R2689 Other abnormalities of gait and mobility: Secondary | ICD-10-CM | POA: Diagnosis not present

## 2018-11-23 DIAGNOSIS — R569 Unspecified convulsions: Secondary | ICD-10-CM | POA: Diagnosis not present

## 2018-11-23 DIAGNOSIS — R4182 Altered mental status, unspecified: Secondary | ICD-10-CM | POA: Diagnosis not present

## 2018-11-30 DIAGNOSIS — Z7901 Long term (current) use of anticoagulants: Secondary | ICD-10-CM | POA: Diagnosis not present

## 2018-12-06 DIAGNOSIS — Z7901 Long term (current) use of anticoagulants: Secondary | ICD-10-CM | POA: Diagnosis not present

## 2018-12-09 DIAGNOSIS — R569 Unspecified convulsions: Secondary | ICD-10-CM | POA: Diagnosis not present

## 2018-12-21 ENCOUNTER — Ambulatory Visit (HOSPITAL_COMMUNITY)
Admission: RE | Admit: 2018-12-21 | Discharge: 2018-12-21 | Disposition: A | Discharge status: Home / Self Care | Payer: Medicare Other | Source: Ambulatory Visit | Attending: Nurse Practitioner | Admitting: Nurse Practitioner

## 2018-12-21 ENCOUNTER — Other Ambulatory Visit (HOSPITAL_COMMUNITY): Payer: Self-pay | Admitting: Nurse Practitioner

## 2018-12-21 DIAGNOSIS — R5381 Other malaise: Secondary | ICD-10-CM | POA: Diagnosis not present

## 2018-12-21 DIAGNOSIS — R062 Wheezing: Secondary | ICD-10-CM | POA: Diagnosis not present

## 2018-12-21 DIAGNOSIS — R42 Dizziness and giddiness: Secondary | ICD-10-CM | POA: Diagnosis not present

## 2018-12-21 DIAGNOSIS — K449 Diaphragmatic hernia without obstruction or gangrene: Secondary | ICD-10-CM | POA: Diagnosis not present

## 2018-12-27 DIAGNOSIS — R42 Dizziness and giddiness: Secondary | ICD-10-CM | POA: Diagnosis not present

## 2018-12-27 DIAGNOSIS — I10 Essential (primary) hypertension: Secondary | ICD-10-CM | POA: Diagnosis not present

## 2018-12-27 DIAGNOSIS — I4891 Unspecified atrial fibrillation: Secondary | ICD-10-CM | POA: Diagnosis not present

## 2018-12-27 DIAGNOSIS — R5383 Other fatigue: Secondary | ICD-10-CM | POA: Diagnosis not present

## 2018-12-27 DIAGNOSIS — Z7901 Long term (current) use of anticoagulants: Secondary | ICD-10-CM | POA: Diagnosis not present

## 2018-12-27 DIAGNOSIS — I5032 Chronic diastolic (congestive) heart failure: Secondary | ICD-10-CM | POA: Diagnosis not present

## 2018-12-29 DIAGNOSIS — Z7901 Long term (current) use of anticoagulants: Secondary | ICD-10-CM | POA: Diagnosis not present

## 2019-01-03 DIAGNOSIS — Z7901 Long term (current) use of anticoagulants: Secondary | ICD-10-CM | POA: Diagnosis not present

## 2019-01-05 DIAGNOSIS — R5381 Other malaise: Secondary | ICD-10-CM | POA: Diagnosis not present

## 2019-01-05 DIAGNOSIS — I10 Essential (primary) hypertension: Secondary | ICD-10-CM | POA: Diagnosis not present

## 2019-01-05 DIAGNOSIS — D649 Anemia, unspecified: Secondary | ICD-10-CM | POA: Diagnosis not present

## 2019-01-05 DIAGNOSIS — L0390 Cellulitis, unspecified: Secondary | ICD-10-CM | POA: Diagnosis not present

## 2019-01-07 DIAGNOSIS — S80821D Blister (nonthermal), right lower leg, subsequent encounter: Secondary | ICD-10-CM | POA: Diagnosis not present

## 2019-01-07 DIAGNOSIS — L03115 Cellulitis of right lower limb: Secondary | ICD-10-CM | POA: Diagnosis not present

## 2019-01-07 DIAGNOSIS — L03116 Cellulitis of left lower limb: Secondary | ICD-10-CM | POA: Diagnosis not present

## 2019-01-07 DIAGNOSIS — I5030 Unspecified diastolic (congestive) heart failure: Secondary | ICD-10-CM | POA: Diagnosis not present

## 2019-01-07 DIAGNOSIS — I482 Chronic atrial fibrillation, unspecified: Secondary | ICD-10-CM | POA: Diagnosis not present

## 2019-01-07 DIAGNOSIS — N183 Chronic kidney disease, stage 3 (moderate): Secondary | ICD-10-CM | POA: Diagnosis not present

## 2019-01-07 DIAGNOSIS — F71 Moderate intellectual disabilities: Secondary | ICD-10-CM | POA: Diagnosis not present

## 2019-01-07 DIAGNOSIS — S80822D Blister (nonthermal), left lower leg, subsequent encounter: Secondary | ICD-10-CM | POA: Diagnosis not present

## 2019-01-09 DIAGNOSIS — I482 Chronic atrial fibrillation, unspecified: Secondary | ICD-10-CM | POA: Diagnosis not present

## 2019-01-09 DIAGNOSIS — S80822D Blister (nonthermal), left lower leg, subsequent encounter: Secondary | ICD-10-CM | POA: Diagnosis not present

## 2019-01-09 DIAGNOSIS — L03116 Cellulitis of left lower limb: Secondary | ICD-10-CM | POA: Diagnosis not present

## 2019-01-09 DIAGNOSIS — S80821D Blister (nonthermal), right lower leg, subsequent encounter: Secondary | ICD-10-CM | POA: Diagnosis not present

## 2019-01-09 DIAGNOSIS — L03115 Cellulitis of right lower limb: Secondary | ICD-10-CM | POA: Diagnosis not present

## 2019-01-09 DIAGNOSIS — I5030 Unspecified diastolic (congestive) heart failure: Secondary | ICD-10-CM | POA: Diagnosis not present

## 2019-01-10 DIAGNOSIS — S80822D Blister (nonthermal), left lower leg, subsequent encounter: Secondary | ICD-10-CM | POA: Diagnosis not present

## 2019-01-10 DIAGNOSIS — I5030 Unspecified diastolic (congestive) heart failure: Secondary | ICD-10-CM | POA: Diagnosis not present

## 2019-01-10 DIAGNOSIS — S80821D Blister (nonthermal), right lower leg, subsequent encounter: Secondary | ICD-10-CM | POA: Diagnosis not present

## 2019-01-10 DIAGNOSIS — L03115 Cellulitis of right lower limb: Secondary | ICD-10-CM | POA: Diagnosis not present

## 2019-01-10 DIAGNOSIS — I482 Chronic atrial fibrillation, unspecified: Secondary | ICD-10-CM | POA: Diagnosis not present

## 2019-01-10 DIAGNOSIS — L03116 Cellulitis of left lower limb: Secondary | ICD-10-CM | POA: Diagnosis not present

## 2019-01-13 DIAGNOSIS — S80822D Blister (nonthermal), left lower leg, subsequent encounter: Secondary | ICD-10-CM | POA: Diagnosis not present

## 2019-01-13 DIAGNOSIS — L03116 Cellulitis of left lower limb: Secondary | ICD-10-CM | POA: Diagnosis not present

## 2019-01-13 DIAGNOSIS — S80821D Blister (nonthermal), right lower leg, subsequent encounter: Secondary | ICD-10-CM | POA: Diagnosis not present

## 2019-01-13 DIAGNOSIS — I482 Chronic atrial fibrillation, unspecified: Secondary | ICD-10-CM | POA: Diagnosis not present

## 2019-01-13 DIAGNOSIS — I5030 Unspecified diastolic (congestive) heart failure: Secondary | ICD-10-CM | POA: Diagnosis not present

## 2019-01-13 DIAGNOSIS — L03115 Cellulitis of right lower limb: Secondary | ICD-10-CM | POA: Diagnosis not present

## 2019-01-17 DIAGNOSIS — S80822D Blister (nonthermal), left lower leg, subsequent encounter: Secondary | ICD-10-CM | POA: Diagnosis not present

## 2019-01-17 DIAGNOSIS — L03116 Cellulitis of left lower limb: Secondary | ICD-10-CM | POA: Diagnosis not present

## 2019-01-17 DIAGNOSIS — I5030 Unspecified diastolic (congestive) heart failure: Secondary | ICD-10-CM | POA: Diagnosis not present

## 2019-01-17 DIAGNOSIS — S80821D Blister (nonthermal), right lower leg, subsequent encounter: Secondary | ICD-10-CM | POA: Diagnosis not present

## 2019-01-17 DIAGNOSIS — L03115 Cellulitis of right lower limb: Secondary | ICD-10-CM | POA: Diagnosis not present

## 2019-01-17 DIAGNOSIS — I482 Chronic atrial fibrillation, unspecified: Secondary | ICD-10-CM | POA: Diagnosis not present

## 2019-01-24 ENCOUNTER — Ambulatory Visit (INDEPENDENT_AMBULATORY_CARE_PROVIDER_SITE_OTHER): Payer: Medicare Other | Admitting: Internal Medicine

## 2019-01-24 ENCOUNTER — Other Ambulatory Visit: Payer: Self-pay

## 2019-01-24 ENCOUNTER — Encounter (INDEPENDENT_AMBULATORY_CARE_PROVIDER_SITE_OTHER): Payer: Self-pay | Admitting: Internal Medicine

## 2019-01-24 VITALS — BP 126/86 | HR 84 | Temp 98.4°F | Ht 68.0 in | Wt 201.1 lb

## 2019-01-24 DIAGNOSIS — D508 Other iron deficiency anemias: Secondary | ICD-10-CM

## 2019-01-24 DIAGNOSIS — Z7901 Long term (current) use of anticoagulants: Secondary | ICD-10-CM | POA: Diagnosis not present

## 2019-01-24 NOTE — Progress Notes (Addendum)
Subjective:    Patient ID: [name], female    DOB: 1941-07-16, 78 y.o.   MRN: [id number] 24 hrs a day 3 liters.  HPI She is wheel chair bound. Niece is with patient.  Referred by Dr. Anastasio Champion for anemia.  12/28/2018 H and H 8.2 and 25.6. Looking back on her H and H, they have been low. In 2017 H and H was normal.  Niece states patient has not had any rectal bleeding. She has GERD. Appetite is okay. She is maintaining her weight. Has a BM daily. No melena or BRRB  AFIB and maintained on Coumadin.  Hx of CKD and is seeing Dr. Hinda Lenis.     Her last colonoscopy was in 2011. Two small polyps. Small cecal ablated via cold biopsy. Biopsy: Hyperplastic polyp  01/01/2016 EGD: chronic GERD, Barrett's. Impression: 4 cm long tubular Barrett's without ulceration or stricture. Moderate to large sliding hiatal hernia. No evidence of peptic ulcer disease. Four quadrant biopsies taken from distal and proximal segment of Barrett's mucosa  Biopsy shows Barrett's without dysplasia.   CBC    Component Value Date/Time   WBC 5.3 08/11/2018 0900   RBC 3.42 (L) 08/11/2018 0900   HGB 8.9 (L) 08/11/2018 0900   HCT 30.9 (L) 08/11/2018 0900   PLT 161 08/11/2018 0900   MCV 90.4 08/11/2018 0900   MCH 26.0 08/11/2018 0900   MCHC 28.8 (L) 08/11/2018 0900   RDW 19.8 (H) 08/11/2018 0900   LYMPHSABS 0.7 07/06/2018 1531   MONOABS 0.4 07/06/2018 1531   EOSABS 0.1 07/06/2018 1531   BASOSABS 0.0 07/06/2018 1531      Review of Systems Past Medical History:  Diagnosis Date   Adenomatous polyps    -resected via colonoscopy in 2011   Anemia    Anxiety    MR   Atrial fibrillation (HCC)    EF of 45%   CHF (congestive heart failure) (HCC)    Chronic kidney disease    Degenerative joint disease    status post right total hip arthroplasty   Dysrhythmia    GERD (gastroesophageal reflux disease)    -Barrett's esophagus   Glaucoma    Glaucoma    Gout    Hyperlipidemia    Recent lipid profile is excellent without lipid-lowering therapy   Hypertension    Mental retardation    Pneumonia    Premature ventricular contractions    Restless leg syndrome    Shortness of breath dyspnea    Wears dentures    Wears glasses     Past Surgical History:  Procedure Laterality Date   CATARACT EXTRACTION  2006   left eye   COLONOSCOPY W/ POLYPECTOMY  2011   ESOPHAGOGASTRODUODENOSCOPY N/A 01/01/2016   Procedure: ESOPHAGOGASTRODUODENOSCOPY (EGD);  Surgeon: Rogene Houston, MD;  [facility];  Service: Endoscopy;  Laterality: N/A;  200   EYE SURGERY     JOINT REPLACEMENT     x2   MULTIPLE TOOTH EXTRACTIONS     RADIOLOGY WITH ANESTHESIA Right 11/21/2015   Procedure: MRI OF RIGHT KNEE WITHOUT CONTAST    (RADIOLOGY WITH ANESTHESIA);  Surgeon: Medication Radiologist, MD;  Location: [facility];  Service: Radiology;  Laterality: Right;   RADIOLOGY WITH ANESTHESIA N/A 08/11/2018   Procedure: MRI of the BRAIN WITH ANESTHESIA WITHOUT CONTRAST;  Surgeon: Radiologist, Medication, MD;  Location: [place];  Service: Radiology;  Laterality: N/A;   TONSILLECTOMY     TOTAL HIP ARTHROPLASTY  2006   Right  No Known Allergies  Current Outpatient Medications on File Prior to Visit  Medication Sig Dispense Refill   acetaminophen (TYLENOL) 500 MG tablet Take 500-1,000 mg by mouth 2 (two) times daily as needed for mild pain or moderate pain.      allopurinol (ZYLOPRIM) 100 MG tablet Take 100 mg by mouth at bedtime.      Brinzolamide-Brimonidine (SIMBRINZA) 1-0.2 % SUSP Place 1 drop into both eyes 2 (two) times daily.      Cholecalciferol (VITAMIN D3) 5000 units CAPS Take 5,000 Units by mouth daily.      esomeprazole (NEXIUM) 20 MG packet Take 20 mg by mouth daily.     fluticasone (FLONASE) 50 MCG/ACT nasal spray Place 1 spray into both nostrils daily.     gabapentin (NEURONTIN) 300 MG capsule Take 300 mg at bedtime by mouth.     guaiFENesin-dextromethorphan  (ROBITUSSIN DM) 100-10 MG/5ML syrup Take 5 mLs by mouth every 4 (four) hours as needed for cough.     latanoprost (XALATAN) 0.005 % ophthalmic solution Place 1 drop into both eyes at bedtime.       loratadine (CLARITIN) 10 MG tablet Take 10 mg by mouth daily.      meclizine (ANTIVERT) 12.5 MG tablet Take 12.5 mg by mouth 3 (three) times daily as needed for dizziness.     metoprolol tartrate (LOPRESSOR) 25 MG tablet TAKE 1/2 TABLET BY MOUTH TWICE DAILY 45 tablet 3   neomycin-polymyxin-hydrocortisone (CORTISPORIN) 3.5-10000-1 otic suspension Place 2-3 drops into both ears 2 (two) times daily as needed (ear pain).      ondansetron (ZOFRAN-ODT) 4 MG disintegrating tablet Take 4 mg by mouth every 4 (four) hours as needed for nausea or vomiting.   1   potassium chloride SA (K-DUR,KLOR-CON) 20 MEQ tablet Take 20 mEq by mouth 2 (two) times daily.      thyroid (ARMOUR) 90 MG tablet Take 90 mg by mouth daily before breakfast.      torsemide (DEMADEX) 20 MG tablet Take 1 tablet (20 mg total) by mouth 2 (two) times daily. 180 tablet 3   warfarin (COUMADIN) 3 MG tablet Take 3 mg by mouth daily.      No current facility-administered medications on file prior to visit.         Objective:   Physical Exam Blood pressure 126/86, pulse 84, temperature 98.4 F (36.9 C), height 5\' 8"  (1.727 m), weight 201 lb 1.6 oz (91.2 kg). Alert and oriented. Skin warm and dry. Oral mucosa is moist.   . Sclera anicteric, conjunctivae is pink. Thyroid not enlarged. No cervical lymphadenopathy. Lungs clear. Heart regular rate and rhythm.  Abdomen is soft. Bowel sounds are positive. No hepatomegaly. No abdominal masses felt. No tenderness.  No edema to lower extremities. Rectal: no masses, no stool. Guaiac negative.         Assessment & Plan:  IDA. Am going to get Iron studies. H and H. 3 stools cards home with patient (Given to niece). Procdure for collecting specimen given to niece. OV pending.

## 2019-01-24 NOTE — Patient Instructions (Signed)
3 stool cards home with patient.  Iron studies.,

## 2019-01-25 DIAGNOSIS — I5030 Unspecified diastolic (congestive) heart failure: Secondary | ICD-10-CM | POA: Diagnosis not present

## 2019-01-25 DIAGNOSIS — I482 Chronic atrial fibrillation, unspecified: Secondary | ICD-10-CM | POA: Diagnosis not present

## 2019-01-25 DIAGNOSIS — S80821D Blister (nonthermal), right lower leg, subsequent encounter: Secondary | ICD-10-CM | POA: Diagnosis not present

## 2019-01-25 DIAGNOSIS — L03115 Cellulitis of right lower limb: Secondary | ICD-10-CM | POA: Diagnosis not present

## 2019-01-25 DIAGNOSIS — L03116 Cellulitis of left lower limb: Secondary | ICD-10-CM | POA: Diagnosis not present

## 2019-01-25 DIAGNOSIS — S80822D Blister (nonthermal), left lower leg, subsequent encounter: Secondary | ICD-10-CM | POA: Diagnosis not present

## 2019-01-25 LAB — HEMOGLOBIN AND HEMATOCRIT, BLOOD
HEMATOCRIT: 27.1 % — AB (ref 35.0–45.0)
Hemoglobin: 8.5 g/dL — ABNORMAL LOW (ref 11.7–15.5)

## 2019-01-25 LAB — IRON,TIBC AND FERRITIN PANEL
%SAT: 8 % — AB (ref 16–45)
Ferritin: 18 ng/mL (ref 16–288)
IRON: 35 ug/dL — AB (ref 45–160)
TIBC: 430 ug/dL (ref 250–450)

## 2019-01-31 DIAGNOSIS — L03115 Cellulitis of right lower limb: Secondary | ICD-10-CM | POA: Diagnosis not present

## 2019-01-31 DIAGNOSIS — S80822D Blister (nonthermal), left lower leg, subsequent encounter: Secondary | ICD-10-CM | POA: Diagnosis not present

## 2019-01-31 DIAGNOSIS — S80821D Blister (nonthermal), right lower leg, subsequent encounter: Secondary | ICD-10-CM | POA: Diagnosis not present

## 2019-01-31 DIAGNOSIS — I482 Chronic atrial fibrillation, unspecified: Secondary | ICD-10-CM | POA: Diagnosis not present

## 2019-01-31 DIAGNOSIS — I5030 Unspecified diastolic (congestive) heart failure: Secondary | ICD-10-CM | POA: Diagnosis not present

## 2019-01-31 DIAGNOSIS — L03116 Cellulitis of left lower limb: Secondary | ICD-10-CM | POA: Diagnosis not present

## 2019-02-01 DIAGNOSIS — Z7901 Long term (current) use of anticoagulants: Secondary | ICD-10-CM | POA: Diagnosis not present

## 2019-02-02 ENCOUNTER — Telehealth (INDEPENDENT_AMBULATORY_CARE_PROVIDER_SITE_OTHER): Payer: Self-pay | Admitting: *Deleted

## 2019-02-02 NOTE — Telephone Encounter (Signed)
   Diagnosis:    Result(s)   Card 1:Negative:     Card 2:Negative:   Card 3:Negative:    Completed by: Kerie Badger,LPN   HEMOCCULT SENSA DEVELOPER: LOT#:  [id number] EXPIRATION DATE: 2021-11   HEMOCCULT SENSA CARD:  BPZ#:[id number]   EXPIRATION DATE: 08/21   CARD CONTROL RESULTS:  POSITIVE: Positive NEGATIVE: Negative    ADDITIONAL COMMENTS: Results are being sent to the ordering provider. Patient has not been called.

## 2019-02-06 DIAGNOSIS — L03115 Cellulitis of right lower limb: Secondary | ICD-10-CM | POA: Diagnosis not present

## 2019-02-06 DIAGNOSIS — S80822D Blister (nonthermal), left lower leg, subsequent encounter: Secondary | ICD-10-CM | POA: Diagnosis not present

## 2019-02-06 DIAGNOSIS — N183 Chronic kidney disease, stage 3 (moderate): Secondary | ICD-10-CM | POA: Diagnosis not present

## 2019-02-06 DIAGNOSIS — I482 Chronic atrial fibrillation, unspecified: Secondary | ICD-10-CM | POA: Diagnosis not present

## 2019-02-06 DIAGNOSIS — I5030 Unspecified diastolic (congestive) heart failure: Secondary | ICD-10-CM | POA: Diagnosis not present

## 2019-02-06 DIAGNOSIS — S80821D Blister (nonthermal), right lower leg, subsequent encounter: Secondary | ICD-10-CM | POA: Diagnosis not present

## 2019-02-06 DIAGNOSIS — L03116 Cellulitis of left lower limb: Secondary | ICD-10-CM | POA: Diagnosis not present

## 2019-02-06 DIAGNOSIS — F71 Moderate intellectual disabilities: Secondary | ICD-10-CM | POA: Diagnosis not present

## 2019-02-07 DIAGNOSIS — L03115 Cellulitis of right lower limb: Secondary | ICD-10-CM | POA: Diagnosis not present

## 2019-02-07 DIAGNOSIS — S80821D Blister (nonthermal), right lower leg, subsequent encounter: Secondary | ICD-10-CM | POA: Diagnosis not present

## 2019-02-07 DIAGNOSIS — I5030 Unspecified diastolic (congestive) heart failure: Secondary | ICD-10-CM | POA: Diagnosis not present

## 2019-02-07 DIAGNOSIS — L03116 Cellulitis of left lower limb: Secondary | ICD-10-CM | POA: Diagnosis not present

## 2019-02-07 DIAGNOSIS — S80822D Blister (nonthermal), left lower leg, subsequent encounter: Secondary | ICD-10-CM | POA: Diagnosis not present

## 2019-02-07 DIAGNOSIS — I482 Chronic atrial fibrillation, unspecified: Secondary | ICD-10-CM | POA: Diagnosis not present

## 2019-02-07 NOTE — Telephone Encounter (Signed)
No answer. Will call tomrorrow. Need EGD/Colonoscopy. She has IDA

## 2019-02-09 ENCOUNTER — Telehealth (INDEPENDENT_AMBULATORY_CARE_PROVIDER_SITE_OTHER): Payer: Self-pay | Admitting: Internal Medicine

## 2019-02-09 ENCOUNTER — Encounter (INDEPENDENT_AMBULATORY_CARE_PROVIDER_SITE_OTHER): Payer: Self-pay | Admitting: *Deleted

## 2019-02-09 ENCOUNTER — Other Ambulatory Visit (INDEPENDENT_AMBULATORY_CARE_PROVIDER_SITE_OTHER): Payer: Self-pay | Admitting: *Deleted

## 2019-02-09 DIAGNOSIS — D649 Anemia, unspecified: Secondary | ICD-10-CM

## 2019-02-09 NOTE — Telephone Encounter (Signed)
H&H noted for 2 weeks and the patient will be sent a letter as a reminder.

## 2019-02-09 NOTE — Telephone Encounter (Signed)
err

## 2019-02-09 NOTE — Telephone Encounter (Signed)
Ann , Need EGD and colonoscopy with propofol.. Iron deficiency. She is on Coumadin and Iron. On oxygen. Needs to be soon what ever the guidelines

## 2019-02-09 NOTE — Telephone Encounter (Signed)
Results given to caregiver. Needs EGD and Colonoscopy with propofol.    Tammy, H and H  in 2 weeks.

## 2019-02-14 ENCOUNTER — Other Ambulatory Visit (INDEPENDENT_AMBULATORY_CARE_PROVIDER_SITE_OTHER): Payer: Self-pay | Admitting: Internal Medicine

## 2019-02-14 ENCOUNTER — Encounter (INDEPENDENT_AMBULATORY_CARE_PROVIDER_SITE_OTHER): Payer: Self-pay | Admitting: *Deleted

## 2019-02-14 DIAGNOSIS — K2270 Barrett's esophagus without dysplasia: Secondary | ICD-10-CM | POA: Insufficient documentation

## 2019-02-14 DIAGNOSIS — D508 Other iron deficiency anemias: Secondary | ICD-10-CM

## 2019-02-14 DIAGNOSIS — D649 Anemia, unspecified: Secondary | ICD-10-CM | POA: Insufficient documentation

## 2019-02-14 NOTE — Telephone Encounter (Signed)
TCS/EGD w propofol sch'd 02/22/19, preop 02/16/19, patient's niece is aware and will pick up instructions 02/16/19

## 2019-02-15 DIAGNOSIS — L03116 Cellulitis of left lower limb: Secondary | ICD-10-CM | POA: Diagnosis not present

## 2019-02-15 DIAGNOSIS — L03115 Cellulitis of right lower limb: Secondary | ICD-10-CM | POA: Diagnosis not present

## 2019-02-15 DIAGNOSIS — D638 Anemia in other chronic diseases classified elsewhere: Secondary | ICD-10-CM | POA: Diagnosis not present

## 2019-02-16 ENCOUNTER — Encounter (HOSPITAL_COMMUNITY)
Admission: RE | Admit: 2019-02-16 | Discharge: 2019-02-16 | Disposition: A | Discharge status: Home / Self Care | Payer: Medicare Other | Source: Ambulatory Visit | Attending: Internal Medicine | Admitting: Internal Medicine

## 2019-02-16 ENCOUNTER — Encounter (HOSPITAL_COMMUNITY): Payer: Self-pay

## 2019-02-16 ENCOUNTER — Other Ambulatory Visit: Payer: Self-pay

## 2019-02-16 DIAGNOSIS — Z01812 Encounter for preprocedural laboratory examination: Secondary | ICD-10-CM | POA: Insufficient documentation

## 2019-02-16 IMAGING — CR DG CHEST 1V PORT
1 series · 1 of 1 positions shown · non-contrast
Comparison: 12/26/2015 chest radiograph

CLINICAL DATA: 76 y/o  F; weakness, dry heaves, recent falls.

EXAM:
PORTABLE CHEST 1 VIEW

[portable]
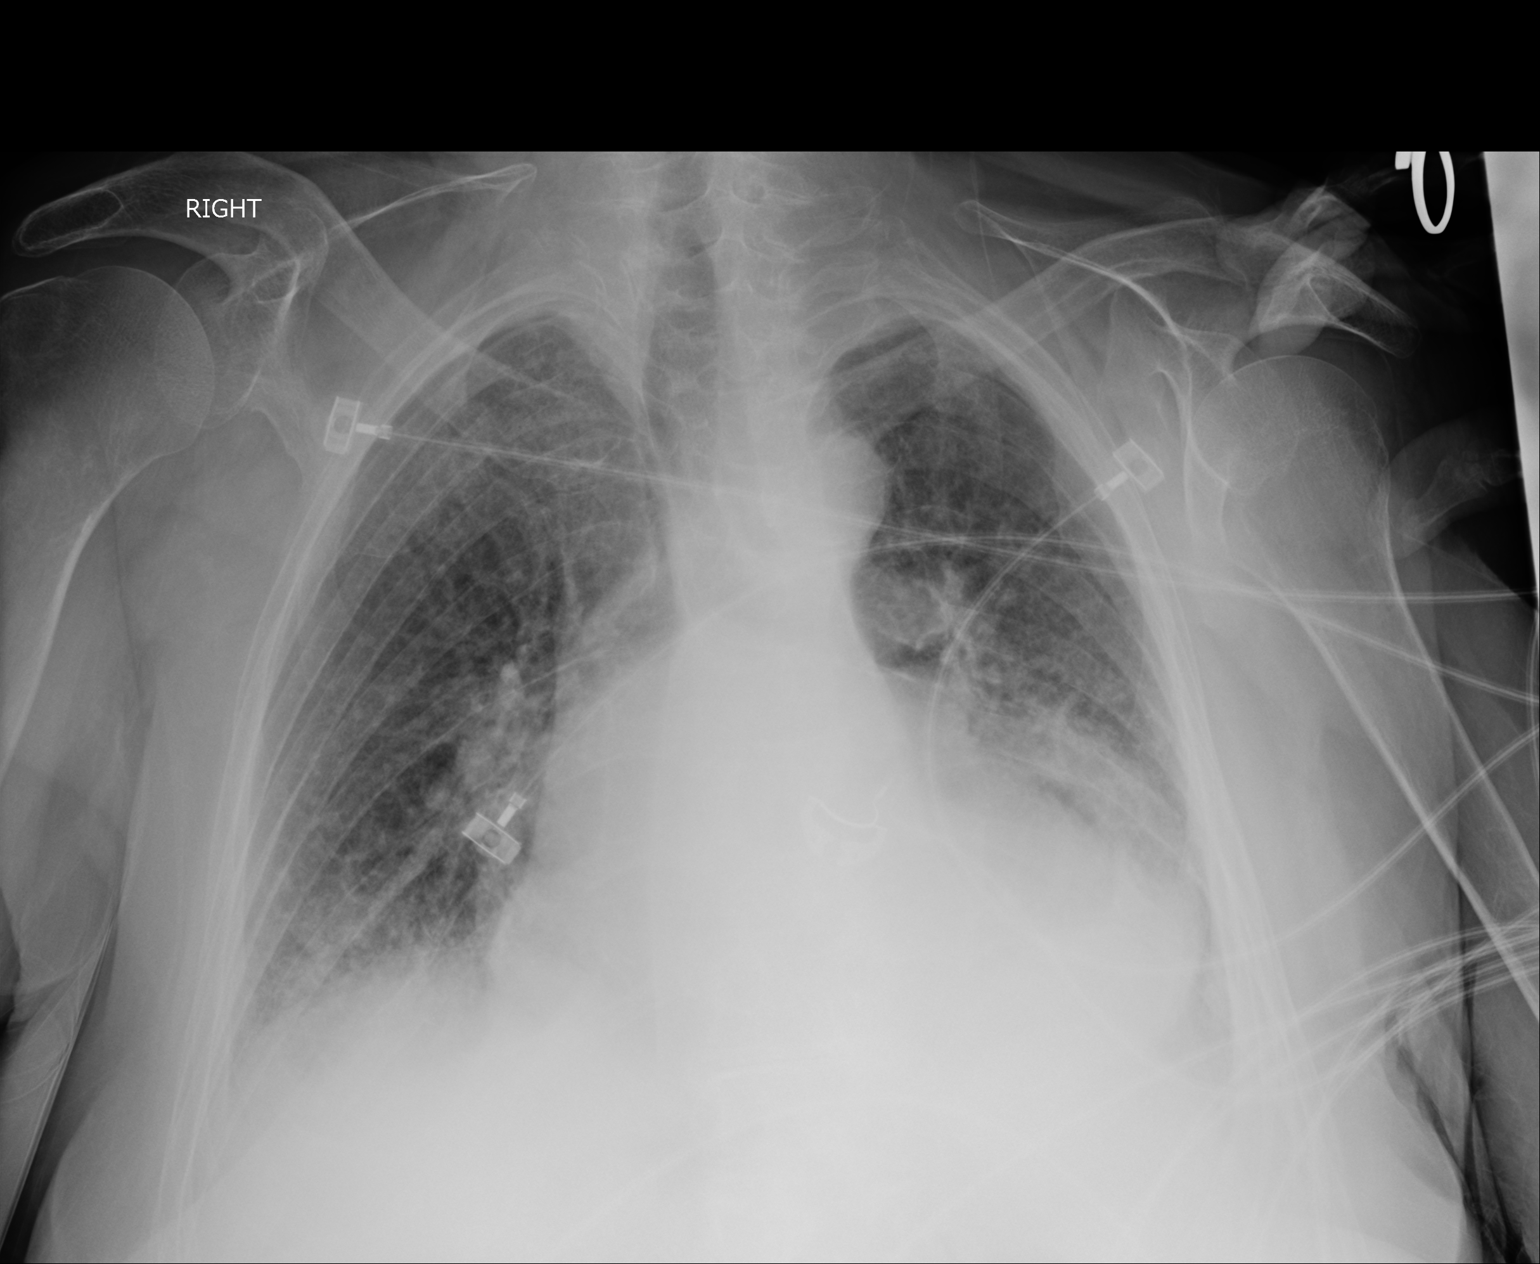

[1 of 1 positions shown; findings below may reference images not displayed]

FINDINGS: Stable cardiomegaly and large hiatal hernia. Diffuse haziness of
lungs, reticular opacities, and blunted costal diaphragmatic angles.
No acute osseous abnormality identified.
IMPRESSION: Mild pulmonary edema and small bilateral pleural effusions. Stable
cardiomegaly and large hiatal hernia

By: Karabey Komander M.D.

## 2019-02-16 NOTE — Progress Notes (Signed)
   02/16/19 1054  OBSTRUCTIVE SLEEP APNEA  Have you ever been diagnosed with sleep apnea through a sleep study? No  Do you snore loudly (loud enough to be heard through closed doors)?  1  Do you often feel tired, fatigued, or sleepy during the daytime (such as falling asleep during driving or talking to someone)? 1  Has anyone observed you stop breathing during your sleep? 1  Do you have, or are you being treated for high blood pressure? 1  BMI more than 35 kg/m2? 0  Age > 50 (1-yes) 1  Neck circumference greater than:Female 16 inches or larger, Female 17inches or larger? 0  Female Gender (Yes=1) 0  Obstructive Sleep Apnea Score 5  Score 5 or greater  Results sent to PCP

## 2019-02-16 NOTE — Pre-Procedure Instructions (Signed)
Patient in for PAT. She has MR and cannot answer her own medical questions. Permission obtained for her niece, Eric Form who is also her CNA, from [employer] and [employer], to be with her during her PAT and her pre and postop since patient has decreased understanding and reads and writes very little. Patient does know that she in having a colonoscopy on next week and Denise,her niece states she does sign for herself. Per niece patient had labs drawn yesterday at Dr Manson Allan office. [name] called and left a message for them to call and let us know what type of blood work was drawn. I will defer drawing more labs until I here from him.

## 2019-02-16 NOTE — Patient Instructions (Signed)
[name]  02/16/2019     @PREFPERIOPPHARMACY @   Your procedure is scheduled on  02/22/2019.  Report to [facility] at  615  A.M.  Call this number if you have problems the morning of surgery:  [phone]   Remember:  Follow the diet and prep instructions given to you by Dr Olevia Perches office.                     Take these medicines the morning of surgery with A SIP OF WATER  Allopurinol, nexium, gabapentin, claritin, antivert, metoprolol, zofran(if needed), thyroid. Follow any instructions given to you from Dr Olevia Perches office concerning coumadin.    Do not wear jewelry, make-up or nail polish.  Do not wear lotions, powders, or perfumes, or deodorant.  Do not shave 48 hours prior to surgery.  Men may shave face and neck.  Do not bring valuables to the hospital.  [facility] is not responsible for any belongings or valuables.  Contacts, dentures or bridgework may not be worn into surgery.  Leave your suitcase in the car.  After surgery it may be brought to your room.  For patients admitted to the hospital, discharge time will be determined by your treatment team.  Patients discharged the day of surgery will not be allowed to drive home.   Name and phone number of your driver:   family Special instructions:  None  Please read over the following fact sheets that you were given. Anesthesia Post-op Instructions and Care and Recovery After Surgery       Upper Endoscopy, Adult, Care After This sheet gives you information about how to care for yourself after your procedure. Your health care provider may also give you more specific instructions. If you have problems or questions, contact your health care provider. What can I expect after the procedure? After the procedure, it is common to have:  A sore throat.  Mild stomach pain or discomfort.  Bloating.  Nausea. Follow these instructions at home:   Follow instructions from your health care provider about what  to eat or drink after your procedure.  Return to your normal activities as told by your health care provider. Ask your health care provider what activities are safe for you.  Take over-the-counter and prescription medicines only as told by your health care provider.  Do not drive for 24 hours if you were given a sedative during your procedure.  Keep all follow-up visits as told by your health care provider. This is important. Contact a health care provider if you have:  A sore throat that lasts longer than one day.  Trouble swallowing. Get help right away if:  You vomit blood or your vomit looks like coffee grounds.  You have: ? A fever. ? Bloody, black, or tarry stools. ? A severe sore throat or you cannot swallow. ? Difficulty breathing. ? Severe pain in your chest or abdomen. Summary  After the procedure, it is common to have a sore throat, mild stomach discomfort, bloating, and nausea.  Do not drive for 24 hours if you were given a sedative during the procedure.  Follow instructions from your health care provider about what to eat or drink after your procedure.  Return to your normal activities as told by your health care provider. This information is not intended to replace advice given to you by your health care provider. Make sure you discuss any questions you have with your  health care provider. Document Released: 04/19/2012 Document Revised: 03/21/2018 Document Reviewed: 03/21/2018 Elsevier Interactive Patient Education  2019 [employer]. Colonoscopy, Adult, Care After This sheet gives you information about how to care for yourself after your procedure. Your health care provider may also give you more specific instructions. If you have problems or questions, contact your health care provider. What can I expect after the procedure? After the procedure, it is common to have:  A small amount of blood in your stool for 24 hours after the procedure.  Some gas.  Mild  abdominal cramping or bloating. Follow these instructions at home: General instructions  For the first 24 hours after the procedure: ? Do not drive or use machinery. ? Do not sign important documents. ? Do not drink alcohol. ? Do your regular daily activities at a slower pace than normal. ? Eat soft, easy-to-digest foods.  Take over-the-counter or prescription medicines only as told by your health care provider. Relieving cramping and bloating   Try walking around when you have cramps or feel bloated.  Apply heat to your abdomen as told by your health care provider. Use a heat source that your health care provider recommends, such as a moist heat pack or a heating pad. ? Place a towel between your skin and the heat source. ? Leave the heat on for 20-30 minutes. ? Remove the heat if your skin turns bright red. This is especially important if you are unable to feel pain, heat, or cold. You may have a greater risk of getting burned. Eating and drinking   Drink enough fluid to keep your urine pale yellow.  Resume your normal diet as instructed by your health care provider. Avoid heavy or fried foods that are hard to digest.  Avoid drinking alcohol for as long as instructed by your health care provider. Contact a health care provider if:  You have blood in your stool 2-3 days after the procedure. Get help right away if:  You have more than a small spotting of blood in your stool.  You pass large blood clots in your stool.  Your abdomen is swollen.  You have nausea or vomiting.  You have a fever.  You have increasing abdominal pain that is not relieved with medicine. Summary  After the procedure, it is common to have a small amount of blood in your stool. You may also have mild abdominal cramping and bloating.  For the first 24 hours after the procedure, do not drive or use machinery, sign important documents, or drink alcohol.  Contact your health care provider if you  have a lot of blood in your stool, nausea or vomiting, a fever, or increased abdominal pain. This information is not intended to replace advice given to you by your health care provider. Make sure you discuss any questions you have with your health care provider. Document Released: 06/02/2004 Document Revised: 08/11/2017 Document Reviewed: 12/31/2015 Elsevier Interactive Patient Education  2019 [employer].

## 2019-02-22 ENCOUNTER — Ambulatory Visit (HOSPITAL_COMMUNITY): Payer: Medicare Other | Admitting: Anesthesiology

## 2019-02-22 ENCOUNTER — Encounter (HOSPITAL_COMMUNITY): Payer: Self-pay

## 2019-02-22 ENCOUNTER — Other Ambulatory Visit: Payer: Self-pay

## 2019-02-22 ENCOUNTER — Ambulatory Visit (HOSPITAL_COMMUNITY)
Admission: RE | Admit: 2019-02-22 | Discharge: 2019-02-22 | Disposition: A | Discharge status: Home / Self Care | Payer: Medicare Other | Attending: Internal Medicine | Admitting: Internal Medicine

## 2019-02-22 ENCOUNTER — Encounter (HOSPITAL_COMMUNITY)
Admission: RE | Disposition: A | Discharge status: Home / Self Care | Payer: Self-pay | Source: Home / Self Care | Attending: Internal Medicine

## 2019-02-22 DIAGNOSIS — M109 Gout, unspecified: Secondary | ICD-10-CM | POA: Diagnosis not present

## 2019-02-22 DIAGNOSIS — Z96641 Presence of right artificial hip joint: Secondary | ICD-10-CM | POA: Diagnosis not present

## 2019-02-22 DIAGNOSIS — I509 Heart failure, unspecified: Secondary | ICD-10-CM | POA: Diagnosis not present

## 2019-02-22 DIAGNOSIS — K219 Gastro-esophageal reflux disease without esophagitis: Secondary | ICD-10-CM | POA: Insufficient documentation

## 2019-02-22 DIAGNOSIS — N189 Chronic kidney disease, unspecified: Secondary | ICD-10-CM | POA: Insufficient documentation

## 2019-02-22 DIAGNOSIS — I482 Chronic atrial fibrillation, unspecified: Secondary | ICD-10-CM | POA: Diagnosis not present

## 2019-02-22 DIAGNOSIS — I130 Hypertensive heart and chronic kidney disease with heart failure and stage 1 through stage 4 chronic kidney disease, or unspecified chronic kidney disease: Secondary | ICD-10-CM | POA: Diagnosis not present

## 2019-02-22 DIAGNOSIS — Z7901 Long term (current) use of anticoagulants: Secondary | ICD-10-CM | POA: Diagnosis not present

## 2019-02-22 DIAGNOSIS — R21 Rash and other nonspecific skin eruption: Secondary | ICD-10-CM | POA: Diagnosis not present

## 2019-02-22 DIAGNOSIS — K2270 Barrett's esophagus without dysplasia: Secondary | ICD-10-CM | POA: Diagnosis not present

## 2019-02-22 DIAGNOSIS — K449 Diaphragmatic hernia without obstruction or gangrene: Secondary | ICD-10-CM | POA: Diagnosis not present

## 2019-02-22 DIAGNOSIS — K644 Residual hemorrhoidal skin tags: Secondary | ICD-10-CM | POA: Diagnosis not present

## 2019-02-22 DIAGNOSIS — F79 Unspecified intellectual disabilities: Secondary | ICD-10-CM | POA: Insufficient documentation

## 2019-02-22 DIAGNOSIS — D649 Anemia, unspecified: Secondary | ICD-10-CM

## 2019-02-22 DIAGNOSIS — D509 Iron deficiency anemia, unspecified: Secondary | ICD-10-CM | POA: Insufficient documentation

## 2019-02-22 DIAGNOSIS — Z800 Family history of malignant neoplasm of digestive organs: Secondary | ICD-10-CM | POA: Insufficient documentation

## 2019-02-22 DIAGNOSIS — K228 Other specified diseases of esophagus: Secondary | ICD-10-CM | POA: Diagnosis not present

## 2019-02-22 DIAGNOSIS — Z79899 Other long term (current) drug therapy: Secondary | ICD-10-CM | POA: Insufficient documentation

## 2019-02-22 DIAGNOSIS — H409 Unspecified glaucoma: Secondary | ICD-10-CM | POA: Diagnosis not present

## 2019-02-22 DIAGNOSIS — Z7951 Long term (current) use of inhaled steroids: Secondary | ICD-10-CM | POA: Insufficient documentation

## 2019-02-22 DIAGNOSIS — Z79890 Hormone replacement therapy (postmenopausal): Secondary | ICD-10-CM | POA: Insufficient documentation

## 2019-02-22 DIAGNOSIS — D508 Other iron deficiency anemias: Secondary | ICD-10-CM

## 2019-02-22 HISTORY — PX: ESOPHAGOGASTRODUODENOSCOPY (EGD) WITH PROPOFOL: SHX5813

## 2019-02-22 HISTORY — PX: COLONOSCOPY WITH PROPOFOL: SHX5780

## 2019-02-22 HISTORY — PX: BIOPSY: SHX5522

## 2019-02-22 LAB — HEMOGLOBIN AND HEMATOCRIT, BLOOD
HCT: 32.3 % — ABNORMAL LOW (ref 36.0–46.0)
Hemoglobin: 9.4 g/dL — ABNORMAL LOW (ref 12.0–15.0)

## 2019-02-22 SURGERY — COLONOSCOPY WITH PROPOFOL
Anesthesia: Monitor Anesthesia Care

## 2019-02-22 MED ORDER — LACTATED RINGERS IV SOLN
INTRAVENOUS | Status: DC
  Administered 2019-02-22: 07:00:00 via INTRAVENOUS

## 2019-02-22 MED ORDER — MEPERIDINE HCL 100 MG/ML IJ SOLN: 6.2500 mg | INTRAMUSCULAR | Status: DC | PRN

## 2019-02-22 MED ORDER — PROPOFOL 10 MG/ML IV BOLUS
INTRAVENOUS | Status: DC | PRN
  Administered 2019-02-22: 40 mg via INTRAVENOUS

## 2019-02-22 MED ORDER — MIDAZOLAM HCL 2 MG/2ML IJ SOLN
INTRAMUSCULAR | Status: AC
  Filled 2019-02-22: qty 2

## 2019-02-22 MED ORDER — HYDROMORPHONE HCL 1 MG/ML IJ SOLN: 0.2500 mg | INTRAMUSCULAR | Status: DC | PRN

## 2019-02-22 MED ORDER — KETAMINE HCL 50 MG/5ML IJ SOSY
PREFILLED_SYRINGE | INTRAMUSCULAR | Status: AC
  Filled 2019-02-22: qty 5

## 2019-02-22 MED ORDER — MIDAZOLAM HCL 5 MG/5ML IJ SOLN
INTRAMUSCULAR | Status: DC | PRN
  Administered 2019-02-22: 2 mg via INTRAVENOUS

## 2019-02-22 MED ORDER — HYDROCODONE-ACETAMINOPHEN 7.5-325 MG PO TABS: 1.0000 | ORAL_TABLET | Freq: Once | ORAL | Status: DC | PRN

## 2019-02-22 MED ORDER — LACTATED RINGERS IV SOLN: INTRAVENOUS | Status: DC

## 2019-02-22 MED ORDER — CHLORHEXIDINE GLUCONATE CLOTH 2 % EX PADS: 6.0000 | MEDICATED_PAD | Freq: Once | CUTANEOUS | Status: DC

## 2019-02-22 MED ORDER — PROPOFOL 500 MG/50ML IV EMUL
INTRAVENOUS | Status: DC | PRN
  Administered 2019-02-22: 125 ug/kg/min via INTRAVENOUS
  Administered 2019-02-22: 75 ug/kg/min via INTRAVENOUS

## 2019-02-22 MED ORDER — PROMETHAZINE HCL 25 MG/ML IJ SOLN: 6.2500 mg | INTRAMUSCULAR | Status: DC | PRN

## 2019-02-22 MED ORDER — HYDROCORTISONE (PERIANAL) 2.5 % EX CREA: 1.0000 "application " | TOPICAL_CREAM | Freq: Two times a day (BID) | CUTANEOUS | 0 refills | Status: DC

## 2019-02-22 NOTE — H&P (Signed)
[name] is an 78 y.o. female.   Chief Complaint: Patient is here for EGD possible EGD and colonoscopy. HPI: Patient is 78 year old Caucasian female with multiple medical problems including chronic atrial fibrillation who is anticoagulated was found to have iron deficiency anemia.  There is no history of melena rectal bleeding hematemesis hematuria or vaginal bleeding.  She was seen in the office recently her stool was guaiac negative.  She has chronic GERD complicated by short segment Barrett's esophagus.  She has been on chronic PPI therapy.  There is no history of anorexia or weight loss although she has had dysphagia with solids.  She has not had an episode of food impaction leading to regurgitation.  She is not using her dentures anymore because they are ill fitting.  Does not take OTC NSAIDs.  Last EGD with biopsy was 3 years ago.  Last colonoscopy was 9 years ago.  She has a history of iron deficiency anemia initially discovered in 2010. Patient has been off warfarin for 5 days. Her maternal grandmother had esophageal carcinoma.  She was possibly older than 30.  Family history is negative for CRC.  Her H&H on 01/24/2019 was 8.5 and 27.1.  Past Medical History:  Diagnosis Date  . Adenomatous polyps    -resected via colonoscopy in 2011  . Anemia   . Anxiety    MR  . Atrial fibrillation (HCC)    EF of 45%  . CHF (congestive heart failure) ([place])   . Chronic kidney disease   . Degenerative joint disease    status post right total hip arthroplasty  . Dysrhythmia   . GERD (gastroesophageal reflux disease)    -Barrett's esophagus  . Glaucoma   . Glaucoma   . Gout   . Hyperlipidemia    Recent lipid profile is excellent without lipid-lowering therapy  . Hypertension   . Mental retardation   . Pneumonia   . Premature ventricular contractions   . Restless leg syndrome   . Shortness of breath dyspnea   . Wears dentures   . Wears glasses     Past Surgical History:  Procedure  Laterality Date  . CATARACT EXTRACTION  2006   left eye  . COLONOSCOPY W/ POLYPECTOMY  2011  . ESOPHAGOGASTRODUODENOSCOPY N/A 01/01/2016   Procedure: ESOPHAGOGASTRODUODENOSCOPY (EGD);  Surgeon: Rogene Houston, MD;  Location: [facility];  Service: Endoscopy;  Laterality: N/A;  200  . EYE SURGERY    . JOINT REPLACEMENT     x2  . MULTIPLE TOOTH EXTRACTIONS    . RADIOLOGY WITH ANESTHESIA Right 11/21/2015   Procedure: MRI OF RIGHT KNEE WITHOUT CONTAST    (RADIOLOGY WITH ANESTHESIA);  Surgeon: Medication Radiologist, MD;  Location: [facility];  Service: Radiology;  Laterality: Right;  . RADIOLOGY WITH ANESTHESIA N/A 08/11/2018   Procedure: MRI of the BRAIN WITH ANESTHESIA WITHOUT CONTRAST;  Surgeon: Radiologist, Medication, MD;  Location: [facility];  Service: Radiology;  Laterality: N/A;  . TONSILLECTOMY    . TOTAL HIP ARTHROPLASTY  2006   Right    History reviewed. No pertinent family history. Social History:  reports that she has never smoked. She has never used smokeless tobacco. She reports that she does not drink alcohol or use drugs.  Allergies: No Known Allergies  Medications Prior to Admission  Medication Sig Dispense Refill  . acetaminophen (TYLENOL) 500 MG tablet Take 500-1,000 mg by mouth every 6 (six) hours as needed for moderate pain.     Marland Kitchen allopurinol (ZYLOPRIM)  100 MG tablet Take 100 mg by mouth at bedtime.     . Brinzolamide-Brimonidine (SIMBRINZA) 1-0.2 % SUSP Place 1 drop into both eyes 2 (two) times daily.     . Cholecalciferol (VITAMIN D3) 5000 units CAPS Take 5,000 Units by mouth daily.     Marland Kitchen esomeprazole (NEXIUM) 20 MG capsule Take 20 mg by mouth daily.     . ferrous gluconate (FERGON) 324 MG tablet Take 324 mg by mouth 2 (two) times daily with a meal.    . fluticasone (FLONASE) 50 MCG/ACT nasal spray Place 1 spray into both nostrils daily.    Marland Kitchen gabapentin (NEURONTIN) 300 MG capsule Take 300 mg at bedtime by mouth.    . lactobacillus acidophilus (BACID) TABS tablet Take 2  tablets by mouth 2 (two) times daily.    Marland Kitchen latanoprost (XALATAN) 0.005 % ophthalmic solution Place 1 drop into both eyes at bedtime.      Marland Kitchen loratadine (CLARITIN) 10 MG tablet Take 10 mg by mouth daily.     . meclizine (ANTIVERT) 12.5 MG tablet Take 12.5 mg by mouth 2 (two) times daily as needed for dizziness.     . metoprolol tartrate (LOPRESSOR) 25 MG tablet TAKE 1/2 TABLET BY MOUTH TWICE DAILY (Patient taking differently: Take 12.5 mg by mouth 2 (two) times daily. ) 45 tablet 3  . neomycin-polymyxin-hydrocortisone (CORTISPORIN) 3.5-10000-1 otic suspension Place 2-3 drops into both ears 2 (two) times daily as needed (ear pain).     . ondansetron (ZOFRAN-ODT) 4 MG disintegrating tablet Take 4 mg by mouth every 4 (four) hours as needed for nausea or vomiting.   1  . potassium chloride SA (K-DUR,KLOR-CON) 20 MEQ tablet Take 20 mEq by mouth 2 (two) times daily.     Marland Kitchen thyroid (ARMOUR) 90 MG tablet Take 90 mg by mouth daily before breakfast.     . torsemide (DEMADEX) 20 MG tablet Take 1 tablet (20 mg total) by mouth 2 (two) times daily. (Patient taking differently: Take 20 mg by mouth daily. ) 180 tablet 3  . warfarin (COUMADIN) 3 MG tablet Take 3 mg by mouth every Monday, Tuesday, Wednesday, Thursday, and Friday.       No results found for this or any previous visit (from the past 48 hour(s)). No results found.  ROS  There were no vitals taken for this visit. Physical Exam  Constitutional: She appears well-developed and well-nourished.  HENT:  Mouth/Throat: Oropharynx is clear and moist.  Small subcutaneous lipoma over medial aspect of right eyebrow.  Eyes: Conjunctivae are normal. No scleral icterus.  Neck: No thyromegaly present.  Cardiovascular:  Irregular rhythm normal S1 and S2.  No murmur or gallop noted.  Respiratory: Effort normal and breath sounds normal.  He has increased request subcutaneous lipoma over right scapular region.  It is about 5 cm in diameter.  GI: Soft. She exhibits  no distension and no mass. There is no abdominal tenderness.  She has small supraumbilical/umbilical hernia.  Diminished soft and nontender.  No organomegaly or masses.  Musculoskeletal:        General: No edema.  Lymphadenopathy:    She has no cervical adenopathy.  Neurological: She is alert.  Skin: Skin is warm and dry.     Assessment/Plan Iron deficiency anemia. GERD complicated by short segment Barrett's esophagus. Solid food dysphagia. Diagnostic EGD possible EGD and colonoscopy.  Hildred Laser, MD 02/22/2019, 7:18 AM

## 2019-02-22 NOTE — Discharge Instructions (Signed)
Resume warfarin at usual dose starting today and INR to be checked in 7 to 10 days. Resume other medications including ferrous sulfate as before. ProctoCream to perianal scalp twice daily for 1 week and thereafter on as-needed basis. Resume usual diet. Physician will call with results of blood test and biopsy. Will schedule upper GI series next month.  Office will call.   Colonoscopy, Adult, Care After This sheet gives you information about how to care for yourself after your procedure. Your health care provider may also give you more specific instructions. If you have problems or questions, contact your health care provider. What can I expect after the procedure? After the procedure, it is common to have:  A small amount of blood in your stool for 24 hours after the procedure.  Some gas.  Mild abdominal cramping or bloating. Follow these instructions at home: General instructions  For the first 24 hours after the procedure: ? Do not drive or use machinery. ? Do not sign important documents. ? Do not drink alcohol. ? Do your regular daily activities at a slower pace than normal. ? Eat soft, easy-to-digest foods.  Take over-the-counter or prescription medicines only as told by your health care provider. Relieving cramping and bloating   Try walking around when you have cramps or feel bloated.  Apply heat to your abdomen as told by your health care provider. Use a heat source that your health care provider recommends, such as a moist heat pack or a heating pad. ? Place a towel between your skin and the heat source. ? Leave the heat on for 20-30 minutes. ? Remove the heat if your skin turns bright red. This is especially important if you are unable to feel pain, heat, or cold. You may have a greater risk of getting burned. Eating and drinking   Drink enough fluid to keep your urine pale yellow.  Resume your normal diet as instructed by your health care provider. Avoid heavy or  fried foods that are hard to digest.  Avoid drinking alcohol for as long as instructed by your health care provider. Contact a health care provider if:  You have blood in your stool 2-3 days after the procedure. Get help right away if:  You have more than a small spotting of blood in your stool.  You pass large blood clots in your stool.  Your abdomen is swollen.  You have nausea or vomiting.  You have a fever.  You have increasing abdominal pain that is not relieved with medicine. Summary  After the procedure, it is common to have a small amount of blood in your stool. You may also have mild abdominal cramping and bloating.  For the first 24 hours after the procedure, do not drive or use machinery, sign important documents, or drink alcohol.  Contact your health care provider if you have a lot of blood in your stool, nausea or vomiting, a fever, or increased abdominal pain. This information is not intended to replace advice given to you by your health care provider. Make sure you discuss any questions you have with your health care provider. Document Released: 06/02/2004 Document Revised: 08/11/2017 Document Reviewed: 12/31/2015 Elsevier Interactive Patient Education  2019 [facility], Adult, Care After This sheet gives you information about how to care for yourself after your procedure. Your health care provider may also give you more specific instructions. If you have problems or questions, contact your health care provider. What can I expect  after the procedure? After the procedure, it is common to have:  A sore throat.  Mild stomach pain or discomfort.  Bloating.  Nausea. Follow these instructions at home:   Follow instructions from your health care provider about what to eat or drink after your procedure.  Return to your normal activities as told by your health care provider. Ask your health care provider what activities are safe for  you.  Take over-the-counter and prescription medicines only as told by your health care provider.  Do not drive for 24 hours if you were given a sedative during your procedure.  Keep all follow-up visits as told by your health care provider. This is important. Contact a health care provider if you have:  A sore throat that lasts longer than one day.  Trouble swallowing. Get help right away if:  You vomit blood or your vomit looks like coffee grounds.  You have: ? A fever. ? Bloody, black, or tarry stools. ? A severe sore throat or you cannot swallow. ? Difficulty breathing. ? Severe pain in your chest or abdomen. Summary  After the procedure, it is common to have a sore throat, mild stomach discomfort, bloating, and nausea.  Do not drive for 24 hours if you were given a sedative during the procedure.  Follow instructions from your health care provider about what to eat or drink after your procedure.  Return to your normal activities as told by your health care provider. This information is not intended to replace advice given to you by your health care provider. Make sure you discuss any questions you have with your health care provider. Document Released: 04/19/2012 Document Revised: 03/21/2018 Document Reviewed: 03/21/2018 Elsevier Interactive Patient Education  2019 [employer].

## 2019-02-22 NOTE — Addendum Note (Signed)
Addendum  created 02/22/19 0936 by Vista Deck, CRNA   Charge Capture section accepted

## 2019-02-22 NOTE — Transfer of Care (Signed)
Immediate Anesthesia Transfer of Care Note  Patient: [name]  Procedure(s) Performed: COLONOSCOPY WITH PROPOFOL (N/A ) ESOPHAGOGASTRODUODENOSCOPY (EGD) WITH PROPOFOL (N/A ) BIOPSY  Patient Location: PACU  Anesthesia Type:MAC  Level of Consciousness: awake and patient cooperative  Airway & Oxygen Therapy: Patient Spontanous Breathing and Patient connected to face mask oxygen  Post-op Assessment: Report given to RN and Post -op Vital signs reviewed and stable  Post vital signs: Reviewed and stable  Last Vitals:  Vitals Value Taken Time  BP    Temp    Pulse    Resp 13 02/22/2019  8:20 AM  SpO2    Vitals shown include unvalidated device data.  Last Pain:  Vitals:   02/22/19 0722  TempSrc: Oral         Complications: No apparent anesthesia complications

## 2019-02-22 NOTE — Op Note (Signed)
[facility] Patient Name: [name] Procedure Date: 02/22/2019 7:56 AM MRN: [id number] Date of Birth: 1940-12-29 Attending MD: Hildred Laser , MD CSN: [id number] Age: 78 Admit Type: Outpatient Procedure:                Colonoscopy Indications:              Unexplained iron deficiency anemia Providers:                Hildred Laser, MD Referring MD:             Doree Albee, MD Medicines:                Propofol per Anesthesia Complications:            No immediate complications. Estimated Blood Loss:      Procedure:                Pre-Anesthesia Assessment:                           - Prior to the procedure, a History and Physical                            was performed, and patient medications and                            allergies were reviewed. The patient's tolerance of                            previous anesthesia was also reviewed. The risks                            and benefits of the procedure and the sedation                            options and risks were discussed with the patient.                            All questions were answered, and informed consent                            was obtained. Prior Anticoagulants: The patient                            last took Coumadin (warfarin) 5 days prior to the                            procedure. ASA Grade Assessment: IV - A patient                            with severe systemic disease that is a constant                            threat to life. After reviewing the risks and  benefits, the patient was deemed in satisfactory                            condition to undergo the procedure.                           After obtaining informed consent, the colonoscope                            was passed under direct vision. Throughout the                            procedure, the patient's blood pressure, pulse, and                            oxygen saturations were monitored  continuously. The                            [id number]-[id number] ([id number]) scope was introduced through                            the and advanced to the the terminal ileum, with                            identification of the appendiceal orifice and IC                            valve. The colonoscopy was performed without                            difficulty. The patient tolerated the procedure                            well. The quality of the bowel preparation was                            excellent. The terminal ileum, ileocecal valve,                            appendiceal orifice, and rectum were photographed. Scope In: 7:58:03 AM Scope Out: 8:11:52 AM Scope Withdrawal Time: 0 hours 7 minutes 23 seconds  Total Procedure Duration: 0 hours 13 minutes 49 seconds  Findings:      The perianal exam findings include a perianal rash.      The digital rectal exam was normal.      The terminal ileum appeared normal.      The colon (entire examined portion) appeared normal except there was       patchy erythema at cecum.      External hemorrhoids were found during retroflexion. The hemorrhoids       were small. Impression:               - Perianal rash found on perianal exam.                           -  The examined portion of the ileum was normal.                           - Focal patcy erythema at cecum otherwise normal                            examination.                           - External hemorrhoids.                           - No specimens collected. Moderate Sedation:      Per Anesthesia Care Recommendation:           - Patient has a contact number available for                            emergencies. The signs and symptoms of potential                            delayed complications were discussed with the                            patient. Return to normal activities tomorrow.                            Written discharge instructions were provided to the                             patient.                           - Resume previous diet today.                           - Continue present medications.                           - Resume Coumadin (warfarin) at prior dose today.                            Refer to [facility] for further adjustment of                            therapy.                           - No repeat colonoscopy due to age and the absence                            of advanced adenomas.                           - ProctoFoam-HC: Apply externally BID for 7 days.                           -  Check H/H today. Procedure Code(s):        --- Professional ---                           [phone], Colonoscopy, flexible; diagnostic, including                            collection of specimen(s) by brushing or washing,                            when performed (separate procedure) Diagnosis Code(s):        --- Professional ---                           R21, Rash and other nonspecific skin eruption                           K64.4, Residual hemorrhoidal skin tags                           D50.9, Iron deficiency anemia, unspecified CPT copyright 2019 American Medical Association. All rights reserved. The codes documented in this report are preliminary and upon coder review may  be revised to meet current compliance requirements. Hildred Laser, MD Hildred Laser, MD 02/22/2019 8:30:52 AM This report has been signed electronically. Number of Addenda: 0

## 2019-02-22 NOTE — Anesthesia Procedure Notes (Signed)
Procedure Name: MAC Date/Time: 02/22/2019 7:32 AM Performed by: Vista Deck, CRNA Pre-anesthesia Checklist: Patient identified, Emergency Drugs available, Suction available, Timeout performed and Patient being monitored Patient Re-evaluated:Patient Re-evaluated prior to induction Oxygen Delivery Method: Simple face mask

## 2019-02-22 NOTE — Anesthesia Preprocedure Evaluation (Signed)
Anesthesia Evaluation    Airway Mallampati: II       Dental  (+) Edentulous Upper, Edentulous Lower   Pulmonary shortness of breath,    breath sounds clear to auscultation       Cardiovascular hypertension, On Medications +CHF  + dysrhythmias Atrial Fibrillation  Rhythm:regular     Neuro/Psych PSYCHIATRIC DISORDERS Anxiety    GI/Hepatic GERD  ,  Endo/Other    Renal/GU CRF     Musculoskeletal   Abdominal   Peds  Hematology  (+) Blood dyscrasia, anemia ,   Anesthesia Other Findings O2 for about one year with onset Afib/CHF histroy provided by niece  Reproductive/Obstetrics                             Anesthesia Physical Anesthesia Plan  ASA: IV  Anesthesia Plan: MAC   Post-op Pain Management:    Induction:   PONV Risk Score and Plan:   Airway Management Planned:   Additional Equipment:   Intra-op Plan:   Post-operative Plan:   Informed Consent: I have reviewed the patients History and Physical, chart, labs and discussed the procedure including the risks, benefits and alternatives for the proposed anesthesia with the patient or authorized representative who has indicated his/her understanding and acceptance.       Plan Discussed with: Anesthesiologist  Anesthesia Plan Comments:         Anesthesia Quick Evaluation

## 2019-02-22 NOTE — Op Note (Signed)
[facility] Patient Name: [name] Procedure Date: 02/22/2019 7:14 AM MRN: [id number] Date of Birth: [birth year] Attending MD: Hildred Laser , MD CSN: [id number] Age: 78 Admit Type: Outpatient Procedure:                Upper GI endoscopy Indications:              Unexplained iron deficiency anemia, Barrett's                            esophagus Providers:                Hildred Laser, MD, Janeece Riggers, RN, Raphael Gibney,                            Technician, Nelma Rothman, Technician Referring MD:             Doree Albee, MD Medicines:                Propofol per Anesthesia Complications:            No immediate complications. Estimated Blood Loss:     Estimated blood loss was minimal. Procedure:                Pre-Anesthesia Assessment:                           - Prior to the procedure, a History and Physical                            was performed, and patient medications and                            allergies were reviewed. The patient's tolerance of                            previous anesthesia was also reviewed. The risks                            and benefits of the procedure and the sedation                            options and risks were discussed with the patient.                            All questions were answered, and informed consent                            was obtained. Prior Anticoagulants: The patient                            last took Coumadin (warfarin) 5 days prior to the                            procedure. ASA Grade Assessment: IV - A patient  with severe systemic disease that is a constant                            threat to life. After reviewing the risks and                            benefits, the patient was deemed in satisfactory                            condition to undergo the procedure.                           After obtaining informed consent, the endoscope was                            passed under  direct vision. Throughout the                            procedure, the patient's blood pressure, pulse, and                            oxygen saturations were monitored continuously. The                            [id number]-H190 ([id number]) scope was introduced through the                            and advanced to the second part of duodenum. The                            upper GI endoscopy was technically difficult and                            complex due to abnormal anatomy. The patient                            tolerated the procedure well. Scope In: 7:38:50 AM Scope Out: 7:52:09 AM Total Procedure Duration: 0 hours 13 minutes 19 seconds  Findings:      The proximal esophagus and mid esophagus were normal.      There were esophageal mucosal changes secondary to established       long-segment Barrett's disease present in the distal esophagus. The       maximum longitudinal extent of these mucosal changes was 4 cm in length.       Mucosa was biopsied with a cold forceps for histology randomly. One       specimen bottle was sent to pathology.      The Z-line was irregular and was found 34 cm from the incisors.      A large hiatal hernia was present.      The gastric body, gastric antrum, prepyloric region of the stomach and       pylorus were normal.      The duodenal bulb and second portion of the duodenum were normal. Impression:               -  Normal proximal esophagus and mid esophagus.                           - Esophageal mucosal changes secondary to                            established long-segment Barrett's disease.                            Biopsied.                           - Z-line irregular, 34 cm from the incisors.                           - Large hiatal hernia with organo-axial rotation                            and difficult examination of distal stomach.                           - Normal gastric body, antrum, prepyloric region of                            the  stomach and pylorus.                           - Normal duodenal bulb and second portion of the                            duodenum. Moderate Sedation:      Per Anesthesia Care Recommendation:           - Patient has a contact number available for                            emergencies. The signs and symptoms of potential                            delayed complications were discussed with the                            patient. Return to normal activities tomorrow.                            Written discharge instructions were provided to the                            patient.                           - Resume previous diet today.                           - Continue present medications.                           -  Await pathology results.                           - UGIS to be scgeduled.                           - See the other procedure note for documentation of                            additional recommendations. Procedure Code(s):        --- Professional ---                           [id number], Esophagogastroduodenoscopy, flexible,                            transoral; with biopsy, single or multiple Diagnosis Code(s):        --- Professional ---                           K22.70, Barrett's esophagus without dysplasia                           K22.8, Other specified diseases of esophagus                           K44.9, Diaphragmatic hernia without obstruction or                            gangrene                           D50.9, Iron deficiency anemia, unspecified CPT copyright 2019 [employer] Association. All rights reserved. The codes documented in this report are preliminary and upon coder review may  be revised to meet current compliance requirements. Hildred Laser, MD Hildred Laser, MD 02/22/2019 8:24:02 AM This report has been signed electronically. Number of Addenda: 0

## 2019-02-22 NOTE — Anesthesia Postprocedure Evaluation (Signed)
Anesthesia Post Note  Patient: [name]  Procedure(s) Performed: COLONOSCOPY WITH PROPOFOL (N/A ) ESOPHAGOGASTRODUODENOSCOPY (EGD) WITH PROPOFOL (N/A ) BIOPSY  Patient location during evaluation: PACU Anesthesia Type: MAC Level of consciousness: awake and alert and patient cooperative Pain management: satisfactory to patient Vital Signs Assessment: post-procedure vital signs reviewed and stable Respiratory status: spontaneous breathing Cardiovascular status: stable Postop Assessment: no apparent nausea or vomiting Anesthetic complications: no     Last Vitals:  Vitals:   02/22/19 0820 02/22/19 0830  BP: (!) 126/92 (!) 163/94  Pulse: 90 85  Resp:  18  Temp: 36.6 C   SpO2:  100%    Last Pain:  Vitals:   02/22/19 0830  TempSrc:   PainSc: 0-No pain                 Maniya Donovan

## 2019-02-23 ENCOUNTER — Encounter (HOSPITAL_COMMUNITY): Payer: Self-pay | Admitting: Internal Medicine

## 2019-02-27 ENCOUNTER — Encounter (INDEPENDENT_AMBULATORY_CARE_PROVIDER_SITE_OTHER): Payer: Self-pay | Admitting: *Deleted

## 2019-02-27 ENCOUNTER — Other Ambulatory Visit (INDEPENDENT_AMBULATORY_CARE_PROVIDER_SITE_OTHER): Payer: Self-pay | Admitting: *Deleted

## 2019-02-27 DIAGNOSIS — K2270 Barrett's esophagus without dysplasia: Secondary | ICD-10-CM

## 2019-02-27 DIAGNOSIS — R1310 Dysphagia, unspecified: Secondary | ICD-10-CM

## 2019-03-01 DIAGNOSIS — Z7901 Long term (current) use of anticoagulants: Secondary | ICD-10-CM | POA: Diagnosis not present

## 2019-03-20 ENCOUNTER — Encounter (HOSPITAL_COMMUNITY): Payer: Self-pay | Admitting: Emergency Medicine

## 2019-03-20 ENCOUNTER — Emergency Department (HOSPITAL_COMMUNITY): Payer: Medicare Other

## 2019-03-20 ENCOUNTER — Other Ambulatory Visit: Payer: Self-pay

## 2019-03-20 ENCOUNTER — Emergency Department (HOSPITAL_COMMUNITY)
Admission: EM | Admit: 2019-03-20 | Discharge: 2019-03-20 | Disposition: A | Discharge status: Home / Self Care | Payer: Medicare Other | Attending: Emergency Medicine | Admitting: Emergency Medicine

## 2019-03-20 DIAGNOSIS — Y929 Unspecified place or not applicable: Secondary | ICD-10-CM | POA: Insufficient documentation

## 2019-03-20 DIAGNOSIS — X58XXXA Exposure to other specified factors, initial encounter: Secondary | ICD-10-CM | POA: Diagnosis not present

## 2019-03-20 DIAGNOSIS — Z9660 Presence of unspecified orthopedic joint implant: Secondary | ICD-10-CM | POA: Insufficient documentation

## 2019-03-20 DIAGNOSIS — Z79899 Other long term (current) drug therapy: Secondary | ICD-10-CM | POA: Diagnosis not present

## 2019-03-20 DIAGNOSIS — Y999 Unspecified external cause status: Secondary | ICD-10-CM | POA: Insufficient documentation

## 2019-03-20 DIAGNOSIS — N183 Chronic kidney disease, stage 3 (moderate): Secondary | ICD-10-CM | POA: Diagnosis not present

## 2019-03-20 DIAGNOSIS — T18128A Food in esophagus causing other injury, initial encounter: Secondary | ICD-10-CM | POA: Insufficient documentation

## 2019-03-20 DIAGNOSIS — Z7901 Long term (current) use of anticoagulants: Secondary | ICD-10-CM | POA: Insufficient documentation

## 2019-03-20 DIAGNOSIS — I5030 Unspecified diastolic (congestive) heart failure: Secondary | ICD-10-CM | POA: Diagnosis not present

## 2019-03-20 DIAGNOSIS — Z96641 Presence of right artificial hip joint: Secondary | ICD-10-CM | POA: Diagnosis not present

## 2019-03-20 DIAGNOSIS — R1310 Dysphagia, unspecified: Secondary | ICD-10-CM | POA: Diagnosis present

## 2019-03-20 DIAGNOSIS — Y9389 Activity, other specified: Secondary | ICD-10-CM | POA: Insufficient documentation

## 2019-03-20 DIAGNOSIS — I130 Hypertensive heart and chronic kidney disease with heart failure and stage 1 through stage 4 chronic kidney disease, or unspecified chronic kidney disease: Secondary | ICD-10-CM | POA: Diagnosis not present

## 2019-03-20 DIAGNOSIS — I4891 Unspecified atrial fibrillation: Secondary | ICD-10-CM | POA: Diagnosis not present

## 2019-03-20 DIAGNOSIS — J9811 Atelectasis: Secondary | ICD-10-CM | POA: Diagnosis not present

## 2019-03-20 DIAGNOSIS — I4811 Longstanding persistent atrial fibrillation: Secondary | ICD-10-CM | POA: Diagnosis not present

## 2019-03-20 DIAGNOSIS — R0600 Dyspnea, unspecified: Secondary | ICD-10-CM | POA: Diagnosis not present

## 2019-03-20 DIAGNOSIS — I517 Cardiomegaly: Secondary | ICD-10-CM | POA: Diagnosis not present

## 2019-03-20 DIAGNOSIS — R918 Other nonspecific abnormal finding of lung field: Secondary | ICD-10-CM | POA: Diagnosis not present

## 2019-03-20 LAB — BASIC METABOLIC PANEL
Anion gap: 9 (ref 5–15)
BUN: 18 mg/dL (ref 8–23)
CO2: 26 mmol/L (ref 22–32)
Calcium: 8.8 mg/dL — ABNORMAL LOW (ref 8.9–10.3)
Chloride: 97 mmol/L — ABNORMAL LOW (ref 98–111)
Creatinine, Ser: 1.36 mg/dL — ABNORMAL HIGH (ref 0.44–1.00)
GFR calc Af Amer: 43 mL/min — ABNORMAL LOW (ref >60)
GFR calc non Af Amer: 37 mL/min — ABNORMAL LOW (ref >60)
Glucose, Bld: 107 mg/dL — ABNORMAL HIGH (ref 70–99)
Potassium: 4.9 mmol/L (ref 3.5–5.1)
Sodium: 132 mmol/L — ABNORMAL LOW (ref 135–145)

## 2019-03-20 LAB — CBC
HCT: 33.7 % — ABNORMAL LOW (ref 36.0–46.0)
Hemoglobin: 10.1 g/dL — ABNORMAL LOW (ref 12.0–15.0)
MCH: 29.6 pg (ref 26.0–34.0)
MCHC: 30 g/dL (ref 30.0–36.0)
MCV: 98.8 fL (ref 80.0–100.0)
Platelets: 161 10*3/uL (ref 150–400)
RBC: 3.41 MIL/uL — ABNORMAL LOW (ref 3.87–5.11)
RDW: 17.2 % — ABNORMAL HIGH (ref 11.5–15.5)
WBC: 6.3 10*3/uL (ref 4.0–10.5)
nRBC: 0 % (ref 0.0–0.2)

## 2019-03-20 MED ORDER — SODIUM CHLORIDE 0.9 % IV SOLN
INTRAVENOUS | Status: DC
  Administered 2019-03-20: 13:00:00 via INTRAVENOUS

## 2019-03-20 NOTE — Discharge Instructions (Addendum)
Return for any new or worse symptoms.  CT scan showed no evidence of pneumonia or aspiration.  Foreign body sensation or food sensation has resolved.  No evidence of any esophageal obstruction.

## 2019-03-20 NOTE — ED Triage Notes (Signed)
Pt sent over by Dr. Anastasio Champion. Pt was eating bacon this morning and felt like a piece got stuck. Pt reports difficulty swallowing. Airway patent.

## 2019-03-20 NOTE — ED Provider Notes (Signed)
[facility] EMERGENCY DEPARTMENT Provider Note   CSN: [id number] Arrival date & time: 03/20/19  1127    History   Chief Complaint Chief Complaint  Patient presents with   Dysphagia    HPI [name] is a 78 y.o. female.     Patient brought in by caregiver and family member.  Patient was sent over by Dr. Anastasio Champion.  Patient was eating bacon this morning and felt like a piece got stuck in the back of her throat.  Patient reports difficulty swallowing.  No trouble with breathing.  Patient was not seen directly by Dr. Anastasio Champion.  Patient states she has had this happen before.  It occurred several hours ago.  I initially saw the patient around 12 noon.  Patient able to drink fine.  Past medical history significant for atrial fibrillation.  Hyperlipidemia.  Mental retardation.  Hypertension.  Anxiety.  Congestive heart failure.  Caregiver states that patient has baseline for her mental status.  Patient without fever no concerns for upper respiratory infection.     Past Medical History:  Diagnosis Date   Adenomatous polyps    -resected via colonoscopy in 2011   Anemia    Anxiety    MR   Atrial fibrillation (HCC)    EF of 45%   CHF (congestive heart failure) (HCC)    Chronic kidney disease    Degenerative joint disease    status post right total hip arthroplasty   Dysrhythmia    GERD (gastroesophageal reflux disease)    -Barrett's esophagus   Glaucoma    Glaucoma    Gout    Hyperlipidemia    Recent lipid profile is excellent without lipid-lowering therapy   Hypertension    Mental retardation    Pneumonia    Premature ventricular contractions    Restless leg syndrome    Shortness of breath dyspnea    Wears dentures    Wears glasses     Patient Active Problem List   Diagnosis Date Noted   Absolute anemia 02/14/2019   Barrett's esophagus without dysplasia 02/14/2019   CHF, acute on chronic ([place]) 04/11/2018   Atrial fibrillation with RVR ([place])  04/11/2018   Electrolyte depletion 09/18/2017   Elevated alkaline phosphatase level 09/18/2017   Acute renal failure superimposed on chronic kidney disease (HCC)    Elevated LFTs    Hypomagnesemia    Mental retardation    Hypokalemia 09/11/2017   Hyponatremia 09/11/2017   Acute renal injury ([place]) 09/11/2017   Dehydration 09/11/2017   Supratherapeutic INR 09/11/2017   Hyperbilirubinemia 09/11/2017   Elevated troponin 09/11/2017   Vulvovaginitis due to yeast 09/11/2017   Syncope 12/26/2015   Chronic anticoagulation 12/20/2012   Chronic kidney disease, stage 3 ([place]) 12/20/2012   Anemia, normocytic normochromic 12/20/2012   History of diagnostic tests 12/20/2012   Acute on chronic diastolic CHF (congestive heart failure) ([place]) 11/25/2011   Atrial fibrillation (HCC)    Degenerative joint disease    GERD (gastroesophageal reflux disease)    Gout    Adenomatous polyps    MENTAL RETARDATION 07/30/2009   GLAUCOMA 06/25/2009    Past Surgical History:  Procedure Laterality Date   BIOPSY  02/22/2019   Procedure: BIOPSY;  Surgeon: Rogene Houston, MD;  Location: [facility];  Service: Endoscopy;;  Esophageal biopies   CATARACT EXTRACTION  2006   left eye   COLONOSCOPY W/ POLYPECTOMY  2011   COLONOSCOPY WITH PROPOFOL N/A 02/22/2019   Procedure: COLONOSCOPY WITH PROPOFOL;  Surgeon: Laural Golden,  Mechele Dawley, MD;  Location: [facility];  Service: Endoscopy;  Laterality: N/A;   ESOPHAGOGASTRODUODENOSCOPY N/A 01/01/2016   Procedure: ESOPHAGOGASTRODUODENOSCOPY (EGD);  Surgeon: Rogene Houston, MD;  Location: [facility];  Service: Endoscopy;  Laterality: N/A;  200   ESOPHAGOGASTRODUODENOSCOPY (EGD) WITH PROPOFOL N/A 02/22/2019   Procedure: ESOPHAGOGASTRODUODENOSCOPY (EGD) WITH PROPOFOL;  Surgeon: Rogene Houston, MD;  Location: [facility];  Service: Endoscopy;  Laterality: N/A;   EYE SURGERY     JOINT REPLACEMENT     x2   MULTIPLE TOOTH EXTRACTIONS      RADIOLOGY WITH ANESTHESIA Right 11/21/2015   Procedure: MRI OF RIGHT KNEE WITHOUT CONTAST    (RADIOLOGY WITH ANESTHESIA);  Surgeon: Medication Radiologist, MD;  Location: [place];  Service: Radiology;  Laterality: Right;   RADIOLOGY WITH ANESTHESIA N/A 08/11/2018   Procedure: MRI of the BRAIN WITH ANESTHESIA WITHOUT CONTRAST;  Surgeon: Radiologist, Medication, MD;  Location: [place];  Service: Radiology;  Laterality: N/A;   TONSILLECTOMY     TOTAL HIP ARTHROPLASTY  2006   Right     OB History   No obstetric history on file.      Home Medications    Prior to Admission medications   Medication Sig Start Date End Date Taking? Authorizing Provider  acetaminophen (TYLENOL) 500 MG tablet Take 500-1,000 mg by mouth every 6 (six) hours as needed for moderate pain.    Yes [provider]  allopurinol (ZYLOPRIM) 100 MG tablet Take 100 mg by mouth at bedtime.    Yes [provider]  Brinzolamide-Brimonidine (SIMBRINZA) 1-0.2 % SUSP Place 1 drop into both eyes 2 (two) times daily.    Yes [provider]  Cholecalciferol (VITAMIN D3) 5000 units CAPS Take 5,000 Units by mouth daily.    Yes [provider]  esomeprazole (NEXIUM) 20 MG capsule Take 20 mg by mouth daily.    Yes [provider]  ferrous gluconate (FERGON) 324 MG tablet Take 324 mg by mouth 2 (two) times daily with a meal.   Yes [provider]  fluticasone (FLONASE) 50 MCG/ACT nasal spray Place 1 spray into both nostrils daily.   Yes [provider]  gabapentin (NEURONTIN) 300 MG capsule Take 300 mg at bedtime by mouth. 08/23/17  Yes [provider]  hydrocortisone (PROCTOCARE-HC) 2.5 % rectal cream Place 1 application rectally 2 (two) times daily. 02/22/19  Yes Rehman, Mechele Dawley, MD  latanoprost (XALATAN) 0.005 % ophthalmic solution Place 1 drop into both eyes at bedtime.     Yes [provider]  loratadine (CLARITIN) 10 MG tablet Take 10 mg by mouth daily.     Yes [provider]  meclizine (ANTIVERT) 12.5 MG tablet Take 12.5 mg by mouth 2 (two) times daily as needed for dizziness.    Yes [provider]  metoprolol tartrate (LOPRESSOR) 25 MG tablet TAKE 1/2 TABLET BY MOUTH TWICE DAILY Patient taking differently: Take 12.5 mg by mouth 2 (two) times daily.  11/18/18  Yes Herminio Commons, MD  neomycin-polymyxin-hydrocortisone (CORTISPORIN) 3.5-10000-1 otic suspension Place 2-3 drops into both ears 2 (two) times daily as needed (ear pain).    Yes [provider]  ondansetron (ZOFRAN-ODT) 4 MG disintegrating tablet Take 4 mg by mouth every 4 (four) hours as needed for nausea or vomiting.  03/17/18  Yes [provider]  potassium chloride SA (K-DUR,KLOR-CON) 20 MEQ tablet Take 20 mEq by mouth 2 (two) times daily.    Yes [provider]  thyroid (ARMOUR) 90 MG tablet Take 90 mg by mouth daily before breakfast.    Yes [provider]  torsemide (DEMADEX) 20 MG tablet Take 1 tablet (20 mg total) by mouth 2 (two) times daily. Patient taking differently: Take 20 mg by mouth daily.  05/20/18  Yes Herminio Commons, MD  warfarin (COUMADIN) 3 MG tablet Take 3 mg by mouth every Monday, Tuesday, Wednesday, Thursday, and Friday. And a 1.5 mg on saturday   Yes [provider]    Family History No family history on file.  Social History Social History   Tobacco Use   Smoking status: Never Smoker   Smokeless tobacco: Never Used  Substance Use Topics   Alcohol use: No   Drug use: No     Allergies   Patient has no known allergies.   Review of Systems Review of Systems  Constitutional: Negative for chills and fever.  HENT: Negative for congestion, rhinorrhea, sore throat, trouble swallowing and voice change.   Eyes: Negative for visual disturbance.  Respiratory: Negative for cough, chest tightness and shortness of breath.   Cardiovascular: Negative for chest pain and leg swelling.    Gastrointestinal: Negative for abdominal pain, diarrhea, nausea and vomiting.  Genitourinary: Negative for dysuria.  Musculoskeletal: Negative for back pain and neck pain.  Skin: Negative for rash.  Neurological: Negative for dizziness, light-headedness and headaches.  Hematological: Does not bruise/bleed easily.  Psychiatric/Behavioral: Positive for confusion.     Physical Exam Updated Vital Signs BP (!) 149/82    Pulse (!) 102    Temp 97.6 F (36.4 C) (Oral)    Resp 19    Ht 1.74 m (5' 8.5")    Wt 88 kg    SpO2 100%    BMI 29.07 kg/m   Physical Exam Vitals signs and nursing note reviewed. Exam conducted with a chaperone present.  Constitutional:      General: She is not in acute distress.    Appearance: Normal appearance. She is well-developed. She is not diaphoretic.  HENT:     Head: Normocephalic and atraumatic.     Mouth/Throat:     Mouth: Mucous membranes are moist.     Pharynx: No oropharyngeal exudate or posterior oropharyngeal erythema.     Comments: Oral cavity and pharynx without any evidence of foreign body or swelling or erythema. Eyes:     Extraocular Movements: Extraocular movements intact.     Conjunctiva/sclera: Conjunctivae normal.     Pupils: Pupils are equal, round, and reactive to light.  Neck:     Musculoskeletal: Normal range of motion and neck supple. No neck rigidity.  Cardiovascular:     Rate and Rhythm: Normal rate. Rhythm irregular.     Heart sounds: No murmur.  Pulmonary:     Effort: Pulmonary effort is normal. No respiratory distress.     Breath sounds: Normal breath sounds.  Abdominal:     General: Bowel sounds are normal.     Palpations: Abdomen is soft.     Tenderness: There is no abdominal tenderness.  Musculoskeletal: Normal range of motion.  Lymphadenopathy:     Cervical: No cervical adenopathy.  Skin:    General: Skin is warm and dry.     Capillary Refill: Capillary refill takes less than 2 seconds.  Neurological:     General:  No focal deficit present.     Mental Status: She is alert. Mental status is at baseline.      ED Treatments / Results  Labs (  all labs ordered are listed, but only abnormal results are displayed) Labs Reviewed  CBC - Abnormal; Notable for the following components:      Result Value   RBC 3.41 (*)    Hemoglobin 10.1 (*)    HCT 33.7 (*)    RDW 17.2 (*)    All other components within normal limits  BASIC METABOLIC PANEL - Abnormal; Notable for the following components:   Sodium 132 (*)    Chloride 97 (*)    Glucose, Bld 107 (*)    Creatinine, Ser 1.36 (*)    Calcium 8.8 (*)    GFR calc non Af Amer 37 (*)    GFR calc Af Amer 43 (*)    All other components within normal limits    EKG EKG Interpretation  Date/Time:  Monday Mar 20 2019 11:45:29 EDT Ventricular Rate:  117 PR Interval:    QRS Duration: 95 QT Interval:  314 QTC Calculation: 438 R Axis:   122 Text Interpretation:  Atrial fibrillation Right axis deviation Low voltage, precordial leads Borderline repolarization abnormality Confirmed by Fredia Sorrow [phone]) on 03/20/2019 12:19:00 PM   Radiology No results found.  Results for orders placed or performed during the hospital encounter of 03/20/19  CBC  Result Value Ref Range   WBC 6.3 4.0 - 10.5 K/uL   RBC 3.41 (L) 3.87 - 5.11 MIL/uL   Hemoglobin 10.1 (L) 12.0 - 15.0 g/dL   HCT 33.7 (L) 36.0 - 46.0 %   MCV 98.8 80.0 - 100.0 fL   MCH 29.6 26.0 - 34.0 pg   MCHC 30.0 30.0 - 36.0 g/dL   RDW 17.2 (H) 11.5 - 15.5 %   Platelets 161 150 - 400 K/uL   nRBC 0.0 0.0 - 0.2 %  Basic metabolic panel  Result Value Ref Range   Sodium 132 (L) 135 - 145 mmol/L   Potassium 4.9 3.5 - 5.1 mmol/L   Chloride 97 (L) 98 - 111 mmol/L   CO2 26 22 - 32 mmol/L   Glucose, Bld 107 (H) 70 - 99 mg/dL   BUN 18 8 - 23 mg/dL   Creatinine, Ser 1.36 (H) 0.44 - 1.00 mg/dL   Calcium 8.8 (L) 8.9 - 10.3 mg/dL   GFR calc non Af Amer 37 (L) >60 mL/min   GFR calc Af Amer 43 (L) >60 mL/min    Anion gap 9 5 - 15   Ct Chest Wo Contrast  Result Date: 03/20/2019 CLINICAL DATA:  Dyspnea EXAM: CT CHEST WITHOUT CONTRAST TECHNIQUE: Multidetector CT imaging of the chest was performed following the standard protocol without IV contrast. COMPARISON:  Chest x-ray from earlier in the same day. FINDINGS: Cardiovascular: Thoracic aorta demonstrates atherosclerotic calcification without aneurysmal dilatation. No contrast was administered. Cardiac shadow is mildly prominent. Coronary calcifications are seen. Mediastinum/Nodes: Thoracic inlet demonstrates a nodule extending from the inferior aspect of the right lobe of the thyroid measuring approximately 2 cm. No sizable hilar or mediastinal adenopathy is noted. The esophagus is within normal limits with the exception of a large sliding-type hiatal hernia greater than the associated size of the heart. The entire stomach is within the chest cavity. Lungs/Pleura: Lungs are well aerated bilaterally. Patchy bilateral infiltrative changes are seen with bronchial thickening particularly in the lower lobes bilaterally. Atelectatic changes are seen. Some changes of interstitial edema are noted. No sizable parenchymal nodule is seen. No focal confluent infiltrate is noted. Upper Abdomen: Visualized upper abdomen is otherwise within normal limits. Musculoskeletal: Degenerative changes  of the thoracic spine are seen. No acute compression fracture is noted. Stable midthoracic compression deformities are noted at T9 and to a lesser degree at T10. IMPRESSION: Changes of mild CHF with pulmonary edema. No focal confluent infiltrate is seen. Right thyroid nodule. Large hiatal hernia.  No esophageal obstruction is noted. Aortic Atherosclerosis (ICD10-I70.0). Electronically Signed   By: Inez Catalina M.D.   On: 03/20/2019 14:23   Dg Chest Port 1 View  Result Date: 03/20/2019 CLINICAL DATA:  Atrial fibrillation. Concern for food impaction after choking on piece of food EXAM: PORTABLE  CHEST 1 VIEW COMPARISON:  December 21, 2018 FINDINGS: There is cardiomegaly with mild pulmonary venous hypertension. There is bibasilar atelectasis. There is a small focus of airspace opacity in the right base. There is a sizable hiatal type hernia. No radiopaque foreign body evident. There is calcification in each carotid artery. No focal bone lesions evident. IMPRESSION: Bibasilar atelectasis. Small focus of airspace opacity in the right base. Question early pneumonia or small area of aspiration. There is cardiomegaly with pulmonary vascular congestion, essentially stable. There is a fairly sizable hiatal hernia. No radiopaque foreign body evident. There are foci of carotid artery calcification bilaterally. Electronically Signed   By: Lowella Grip III M.D.   On: 03/20/2019 12:16     Procedures Procedures (including critical care time)  Medications Ordered in ED Medications - No data to display   Initial Impression / Assessment and Plan / ED Course  I have reviewed the triage vital signs and the nursing notes.  Pertinent labs & imaging results that were available during my care of the patient were reviewed by me and considered in my medical decision making (see chart for details).      CT scan done to rule out possible infiltrate.  CT scan showed no evidence of infiltrate.  Patient here with atrial fibrillation heart rate at times in low 100s.  Patient nontoxic no acute distress.  Patient having no difficulty swallowing or drinking fluids.  Patient in no acute distress.  No evidence of foreign body on x-rays or examination of the oropharynx.  Plus this was bacon and it would eventually breakdown.  Patient stable to go back home.  Caretaker and family member with her.    Final Clinical Impressions(s) / ED Diagnoses   Final diagnoses:  Food impaction of esophagus, initial encounter  Longstanding persistent atrial fibrillation    ED Discharge Orders    None       Fredia Sorrow, MD 03/25/19 [phone]

## 2019-03-28 DIAGNOSIS — Z7901 Long term (current) use of anticoagulants: Secondary | ICD-10-CM | POA: Diagnosis not present

## 2019-03-30 DIAGNOSIS — N183 Chronic kidney disease, stage 3 (moderate): Secondary | ICD-10-CM | POA: Diagnosis not present

## 2019-03-30 DIAGNOSIS — D649 Anemia, unspecified: Secondary | ICD-10-CM | POA: Diagnosis not present

## 2019-03-30 DIAGNOSIS — I10 Essential (primary) hypertension: Secondary | ICD-10-CM | POA: Diagnosis not present

## 2019-03-30 DIAGNOSIS — N184 Chronic kidney disease, stage 4 (severe): Secondary | ICD-10-CM | POA: Diagnosis not present

## 2019-03-30 DIAGNOSIS — E559 Vitamin D deficiency, unspecified: Secondary | ICD-10-CM | POA: Diagnosis not present

## 2019-03-30 DIAGNOSIS — R42 Dizziness and giddiness: Secondary | ICD-10-CM | POA: Diagnosis not present

## 2019-04-03 ENCOUNTER — Other Ambulatory Visit: Payer: Self-pay

## 2019-04-03 ENCOUNTER — Ambulatory Visit (HOSPITAL_COMMUNITY): Admission: RE | Admit: 2019-04-03 | Payer: Medicare Other | Source: Ambulatory Visit

## 2019-04-03 ENCOUNTER — Ambulatory Visit (HOSPITAL_COMMUNITY)
Admission: RE | Admit: 2019-04-03 | Discharge: 2019-04-03 | Disposition: A | Discharge status: Home / Self Care | Payer: Medicare Other | Source: Ambulatory Visit | Attending: Internal Medicine | Admitting: Internal Medicine

## 2019-04-03 DIAGNOSIS — R1310 Dysphagia, unspecified: Secondary | ICD-10-CM | POA: Diagnosis not present

## 2019-04-03 DIAGNOSIS — K224 Dyskinesia of esophagus: Secondary | ICD-10-CM | POA: Diagnosis not present

## 2019-04-12 DIAGNOSIS — I5032 Chronic diastolic (congestive) heart failure: Secondary | ICD-10-CM | POA: Diagnosis not present

## 2019-04-12 DIAGNOSIS — R600 Localized edema: Secondary | ICD-10-CM | POA: Diagnosis not present

## 2019-04-12 DIAGNOSIS — I4891 Unspecified atrial fibrillation: Secondary | ICD-10-CM | POA: Diagnosis not present

## 2019-04-12 DIAGNOSIS — J309 Allergic rhinitis, unspecified: Secondary | ICD-10-CM | POA: Diagnosis not present

## 2019-05-02 DIAGNOSIS — Z7901 Long term (current) use of anticoagulants: Secondary | ICD-10-CM | POA: Diagnosis not present

## 2019-05-29 ENCOUNTER — Other Ambulatory Visit: Payer: Self-pay | Admitting: Cardiovascular Disease

## 2019-05-30 DIAGNOSIS — Z7901 Long term (current) use of anticoagulants: Secondary | ICD-10-CM | POA: Diagnosis not present

## 2019-05-31 DIAGNOSIS — Z7901 Long term (current) use of anticoagulants: Secondary | ICD-10-CM | POA: Diagnosis not present

## 2019-06-13 LAB — POCT INR: INR: 2.3 — AB (ref <=1.1)

## 2019-06-16 ENCOUNTER — Ambulatory Visit (INDEPENDENT_AMBULATORY_CARE_PROVIDER_SITE_OTHER): Payer: Medicare Other | Admitting: Cardiovascular Disease

## 2019-06-16 ENCOUNTER — Encounter: Payer: Self-pay | Admitting: Cardiovascular Disease

## 2019-06-16 ENCOUNTER — Other Ambulatory Visit: Payer: Self-pay

## 2019-06-16 VITALS — BP 131/80 | HR 103 | Temp 96.6°F | Ht 68.0 in | Wt 190.0 lb

## 2019-06-16 DIAGNOSIS — I5032 Chronic diastolic (congestive) heart failure: Secondary | ICD-10-CM

## 2019-06-16 DIAGNOSIS — I25118 Atherosclerotic heart disease of native coronary artery with other forms of angina pectoris: Secondary | ICD-10-CM

## 2019-06-16 DIAGNOSIS — R002 Palpitations: Secondary | ICD-10-CM | POA: Diagnosis not present

## 2019-06-16 DIAGNOSIS — R0609 Other forms of dyspnea: Secondary | ICD-10-CM

## 2019-06-16 DIAGNOSIS — Z79899 Other long term (current) drug therapy: Secondary | ICD-10-CM

## 2019-06-16 DIAGNOSIS — I4811 Longstanding persistent atrial fibrillation: Secondary | ICD-10-CM

## 2019-06-16 DIAGNOSIS — N183 Chronic kidney disease, stage 3 unspecified: Secondary | ICD-10-CM

## 2019-06-16 MED ORDER — METOPROLOL TARTRATE 25 MG PO TABS: 25.0000 mg | ORAL_TABLET | Freq: Two times a day (BID) | ORAL | 2 refills | Status: DC

## 2019-06-16 NOTE — Patient Instructions (Signed)
Medication Instructions:  INCREASE Lopressor to 25 mg twice a day   Labwork: None  Procedures/Testing: None  Follow-Up:  PHONE visit with Dr.Koneswaran in 6 weeks  Any Additional Special Instructions Will Be Listed Below (If Applicable).     If you need a refill on your cardiac medications before your next appointment, please call your pharmacy.     Thank you for choosing [facility] !

## 2019-06-16 NOTE — Progress Notes (Signed)
SUBJECTIVE: The patient presents for follow-up of chronic diastolic heart failure, atrial flutter and fibrillation, and hypertension.  Echocardiogram performed on 04/12/2018 showed normal left ventricular systolic function and regional wall motion, LVEF 55 to 79%, diastolic dysfunction with high ventricular filling pressures, and mild aortic, mild mitral, and moderate tricuspid regurgitation.  She underwent a low risk nuclear stress test on 08/19/2018, EF 51%.  There appeared to be a small distal anteroseptal myocardial infarction with no evidence of ischemia.  She is here with her niece, [name].  [name] says Kailee has been complaining of palpitations more in the past 2 weeks, and has had some exertional dyspnea. O2 sats 96% today. HR 103 bpm.  She takes torsemide sporadically throughout the week as given to her by [name].   Review of Systems: As per "subjective", otherwise negative.  No Known Allergies  Current Outpatient Medications  Medication Sig Dispense Refill  . acetaminophen (TYLENOL) 500 MG tablet Take 500-1,000 mg by mouth every 6 (six) hours as needed for moderate pain.     Marland Kitchen allopurinol (ZYLOPRIM) 100 MG tablet Take 100 mg by mouth at bedtime.     . Brinzolamide-Brimonidine (SIMBRINZA) 1-0.2 % SUSP Place 1 drop into both eyes 2 (two) times daily.     . Cholecalciferol (VITAMIN D3) 5000 units CAPS Take 5,000 Units by mouth daily.     Marland Kitchen esomeprazole (NEXIUM) 20 MG capsule Take 20 mg by mouth daily.     . ferrous gluconate (FERGON) 324 MG tablet Take 324 mg by mouth daily.     . fluticasone (FLONASE) 50 MCG/ACT nasal spray Place 1 spray into both nostrils daily.    Marland Kitchen gabapentin (NEURONTIN) 300 MG capsule Take 300 mg at bedtime by mouth.    . hydrocortisone (PROCTOCARE-HC) 2.5 % rectal cream Place 1 application rectally 2 (two) times daily. 30 g 0  . latanoprost (XALATAN) 0.005 % ophthalmic solution Place 1 drop into both eyes at bedtime.      Marland Kitchen loratadine (CLARITIN) 10  MG tablet Take 10 mg by mouth daily.     . meclizine (ANTIVERT) 12.5 MG tablet Take 12.5 mg by mouth 2 (two) times daily as needed for dizziness.     . metoprolol tartrate (LOPRESSOR) 25 MG tablet TAKE 1/2 TABLET BY MOUTH TWICE DAILY 45 tablet 2  . neomycin-polymyxin-hydrocortisone (CORTISPORIN) 3.5-10000-1 otic suspension Place 2-3 drops into both ears 2 (two) times daily as needed (ear pain).     . ondansetron (ZOFRAN-ODT) 4 MG disintegrating tablet Take 4 mg by mouth every 4 (four) hours as needed for nausea or vomiting.   1  . potassium chloride SA (K-DUR,KLOR-CON) 20 MEQ tablet Take 20 mEq by mouth 2 (two) times daily.     Marland Kitchen thyroid (ARMOUR) 90 MG tablet Take 90 mg by mouth daily before breakfast.     . torsemide (DEMADEX) 20 MG tablet Take 1 tablet (20 mg total) by mouth 2 (two) times daily. (Patient taking differently: Take 20 mg by mouth daily. ) 180 tablet 3  . warfarin (COUMADIN) 3 MG tablet Take 3 mg by mouth every Monday, Tuesday, Wednesday, Thursday, and Friday. And a 1.5 mg on saturday     No current facility-administered medications for this visit.     Past Medical History:  Diagnosis Date  . Adenomatous polyps    -resected via colonoscopy in 2011  . Anemia   . Anxiety    MR  . Atrial fibrillation (HCC)    EF of 45%  .  CHF (congestive heart failure) ([place])   . Chronic kidney disease   . Degenerative joint disease    status post right total hip arthroplasty  . Dysrhythmia   . GERD (gastroesophageal reflux disease)    -Barrett's esophagus  . Glaucoma   . Glaucoma   . Gout   . Hyperlipidemia    Recent lipid profile is excellent without lipid-lowering therapy  . Hypertension   . Mental retardation   . Pneumonia   . Premature ventricular contractions   . Restless leg syndrome   . Shortness of breath dyspnea   . Wears dentures   . Wears glasses     Past Surgical History:  Procedure Laterality Date  . BIOPSY  02/22/2019   Procedure: BIOPSY;  Surgeon: Rogene Houston, MD;  [facility];  Service: Endoscopy;;  Esophageal biopies  . CATARACT EXTRACTION  2006   left eye  . COLONOSCOPY W/ POLYPECTOMY  2011  . COLONOSCOPY WITH PROPOFOL N/A 02/22/2019   Procedure: COLONOSCOPY WITH PROPOFOL;  Surgeon: Rogene Houston, MD;  [facility];  Service: Endoscopy;  Laterality: N/A;  . ESOPHAGOGASTRODUODENOSCOPY N/A 01/01/2016   Procedure: ESOPHAGOGASTRODUODENOSCOPY (EGD);  Surgeon: Rogene Houston, MD;  [facility];  Service: Endoscopy;  Laterality: N/A;  200  . ESOPHAGOGASTRODUODENOSCOPY (EGD) WITH PROPOFOL N/A 02/22/2019   Procedure: ESOPHAGOGASTRODUODENOSCOPY (EGD) WITH PROPOFOL;  Surgeon: Rogene Houston, MD;  [facility];  Service: Endoscopy;  Laterality: N/A;  . EYE SURGERY    . JOINT REPLACEMENT     x2  . MULTIPLE TOOTH EXTRACTIONS    . RADIOLOGY WITH ANESTHESIA Right 11/21/2015   Procedure: MRI OF RIGHT KNEE WITHOUT CONTAST    (RADIOLOGY WITH ANESTHESIA);  Surgeon: Medication Radiologist, MD;  Location: [facility];  Service: Radiology;  Laterality: Right;  . RADIOLOGY WITH ANESTHESIA N/A 08/11/2018   Procedure: MRI of the BRAIN WITH ANESTHESIA WITHOUT CONTRAST;  Surgeon: Radiologist, Medication, MD;  Location: [facility];  Service: Radiology;  Laterality: N/A;  . TONSILLECTOMY    . TOTAL HIP ARTHROPLASTY  2006   Right    Social History   Socioeconomic History  . Marital status: Single    Spouse name: Not on file  . Number of children: Not on file  . Years of education: Not on file  . Highest education level: Not on file  Occupational History  . Occupation: disabled  Social Needs  . Financial resource strain: Not on file  . Food insecurity    Worry: Not on file    Inability: Not on file  . Transportation needs    Medical: Not on file    Non-medical: Not on file  Tobacco Use  . Smoking status: Never Smoker  . Smokeless tobacco: Never Used  Substance and Sexual Activity  . Alcohol use: No  . Drug use:  No  . Sexual activity: Not Currently  Lifestyle  . Physical activity    Days per week: Not on file    Minutes per session: Not on file  . Stress: Not on file  Relationships  . Social Herbalist on phone: Not on file    Gets together: Not on file    Attends religious service: Not on file    Active member of club or organization: Not on file    Attends meetings of clubs or organizations: Not on file    Relationship status: Not on file  . Intimate partner violence  Fear of current or ex partner: Not on file    Emotionally abused: Not on file    Physically abused: Not on file    Forced sexual activity: Not on file  Other Topics Concern  . Not on file  Social History Narrative  . Not on file     Vitals:   06/16/19 1325  BP: 131/80  Pulse: (!) 103  Temp: (!) 96.6 F (35.9 C)  SpO2: 96%  Weight: 190 lb (86.2 kg)  Height: 5\' 8"  (1.727 m)    Wt Readings from Last 3 Encounters:  06/16/19 190 lb (86.2 kg)  03/20/19 194 lb (88 kg)  02/16/19 200 lb (90.7 kg)     PHYSICAL EXAM General: NAD HEENT: Normal. Neck: No JVD, no thyromegaly. Lungs: Clear to auscultation bilaterally with normal respiratory effort. CV: Mildly tachycardic, irregular rhythm, normal S1/S2, no S3, no murmur.  Trivial bilateral lower extremity edema. Abdomen: Soft, nontender, no distention.  Neurologic: Alert and oriented.  Psych: Normal affect. Skin: Normal. Musculoskeletal: No gross deformities.    ECG: Reviewed above under Subjective   Labs: Lab Results  Component Value Date/Time   K 4.9 03/20/2019 12:29 PM   BUN 18 03/20/2019 12:29 PM   CREATININE 1.36 (H) 03/20/2019 12:29 PM   ALT 12 07/06/2018 03:31 PM   TSH 1.525 12/26/2015 01:46 PM   TSH 3.852 11/25/2011 10:10 AM   HGB 10.1 (L) 03/20/2019 12:29 PM     Lipids: Lab Results  Component Value Date/Time   LDLCALC 75 07/27/2009   CHOL 132 07/27/2009   TRIG 69 07/27/2009   HDL 43 07/27/2009       ASSESSMENT AND PLAN:  1. Chronic diastolic heart failure: Weight down 10 pounds from April 2020. Most recent echocardiogram reviewed above. Currently on torsemide 20 mg daily and supplemental potassium.She takes an extra torsemide as needed for increased leg and feet swelling.  2.Longstanding persistent atrial fibrillation/flutter: She has more palpitations and heart rate is mildly elevated.  I will increase Lopressor to 25 mg twice daily.  This may need to be increased further. Systemically anticoagulated with warfarin.   3. Chronic kidney disease stage III:  Renal function reviewed above with creatinine of 1.36 on 03/20/2019.  Monitored by PCP.  4.   Coronary artery disease: Nuclear stress test reviewed above with evidence of small prior MI.  No aspirin as she is on warfarin.  She is also on a beta-blocker.    Disposition: Follow up 6 weeks (virtual/phone)   Kate Sable, M.D., F.A.C.C.

## 2019-06-20 LAB — PROTIME-INR: INR: 2.9 — AB (ref 0.9–1.1)

## 2019-06-27 DIAGNOSIS — H35373 Puckering of macula, bilateral: Secondary | ICD-10-CM | POA: Diagnosis not present

## 2019-06-27 DIAGNOSIS — H04123 Dry eye syndrome of bilateral lacrimal glands: Secondary | ICD-10-CM | POA: Diagnosis not present

## 2019-06-27 DIAGNOSIS — H401133 Primary open-angle glaucoma, bilateral, severe stage: Secondary | ICD-10-CM | POA: Diagnosis not present

## 2019-06-27 DIAGNOSIS — Z961 Presence of intraocular lens: Secondary | ICD-10-CM | POA: Diagnosis not present

## 2019-06-27 LAB — PROTIME-INR: INR: 2.5 — AB (ref 0.9–1.1)

## 2019-06-28 DIAGNOSIS — Z7901 Long term (current) use of anticoagulants: Secondary | ICD-10-CM | POA: Diagnosis not present

## 2019-07-05 ENCOUNTER — Ambulatory Visit (INDEPENDENT_AMBULATORY_CARE_PROVIDER_SITE_OTHER): Payer: Self-pay

## 2019-07-06 ENCOUNTER — Other Ambulatory Visit: Payer: Self-pay

## 2019-07-06 ENCOUNTER — Encounter (INDEPENDENT_AMBULATORY_CARE_PROVIDER_SITE_OTHER): Payer: Self-pay | Admitting: Internal Medicine

## 2019-07-06 ENCOUNTER — Ambulatory Visit (INDEPENDENT_AMBULATORY_CARE_PROVIDER_SITE_OTHER): Payer: Medicare Other | Admitting: Internal Medicine

## 2019-07-06 VITALS — BP 140/80 | HR 80 | Ht 68.0 in | Wt 175.0 lb

## 2019-07-06 DIAGNOSIS — I25118 Atherosclerotic heart disease of native coronary artery with other forms of angina pectoris: Secondary | ICD-10-CM | POA: Diagnosis not present

## 2019-07-06 DIAGNOSIS — F79 Unspecified intellectual disabilities: Secondary | ICD-10-CM

## 2019-07-06 DIAGNOSIS — I5032 Chronic diastolic (congestive) heart failure: Secondary | ICD-10-CM | POA: Diagnosis not present

## 2019-07-06 DIAGNOSIS — I4811 Longstanding persistent atrial fibrillation: Secondary | ICD-10-CM | POA: Diagnosis not present

## 2019-07-06 DIAGNOSIS — K219 Gastro-esophageal reflux disease without esophagitis: Secondary | ICD-10-CM

## 2019-07-06 DIAGNOSIS — I5033 Acute on chronic diastolic (congestive) heart failure: Secondary | ICD-10-CM | POA: Diagnosis not present

## 2019-07-06 NOTE — Progress Notes (Signed)
Subjective:  Patient ID: Rennis Petty, female    DOB: 11/17/1940  Age: 78 y.o. MRN: [id number]  CC: This lady comes in for follow-up of her chronic conditions which include atrial fibrillation, congestive heart failure, gastroesophageal reflux disease.  HPI Overall, she is doing reasonably well.  She has no complaints today.  She is seen by cardiology on a regular basis and the Coumadin clinic for management of anticoagulation therapy.  She is compliant with all medications and [name], her niece is her main caregiver.   Past Medical History:  Diagnosis Date  . Adenomatous polyps    -resected via colonoscopy in 2011  . Anemia   . Anxiety    MR  . Atrial fibrillation (HCC)    EF of 45%  . CHF (congestive heart failure) ([place])   . Chronic kidney disease   . Degenerative joint disease    status post right total hip arthroplasty  . Dysrhythmia   . GERD (gastroesophageal reflux disease)    -Barrett's esophagus  . Glaucoma   . Glaucoma   . Gout   . Hyperlipidemia    Recent lipid profile is excellent without lipid-lowering therapy  . Hypertension   . Mental retardation   . Pneumonia   . Premature ventricular contractions   . Restless leg syndrome   . Shortness of breath dyspnea   . Wears dentures   . Wears glasses      Social History   Social History Narrative  . Not on file   Social History   Substance and Sexual Activity  Alcohol Use No    Social History   Tobacco Use  Smoking Status Never Smoker  Smokeless Tobacco Never Used    Single   Current Meds  Medication Sig  . acetaminophen (TYLENOL) 500 MG tablet Take 500-1,000 mg by mouth every 6 (six) hours as needed for moderate pain.   Marland Kitchen allopurinol (ZYLOPRIM) 100 MG tablet Take 100 mg by mouth at bedtime.   . Brinzolamide-Brimonidine (SIMBRINZA) 1-0.2 % SUSP Place 1 drop into both eyes 2 (two) times daily.   . Cholecalciferol (VITAMIN D3) 5000 units CAPS Take 5,000 Units by mouth daily.   Marland Kitchen  esomeprazole (NEXIUM) 20 MG capsule Take 20 mg by mouth 2 (two) times daily before a meal.   . ferrous gluconate (FERGON) 324 MG tablet Take 324 mg by mouth daily.   . fluticasone (FLONASE) 50 MCG/ACT nasal spray Place 1 spray into both nostrils daily.  Marland Kitchen gabapentin (NEURONTIN) 300 MG capsule Take 300 mg at bedtime by mouth.  . latanoprost (XALATAN) 0.005 % ophthalmic solution Place 1 drop into both eyes at bedtime.    Marland Kitchen loratadine (CLARITIN) 10 MG tablet Take 10 mg by mouth daily.   . meclizine (ANTIVERT) 12.5 MG tablet Take 12.5 mg by mouth 2 (two) times daily as needed for dizziness.   . metoprolol tartrate (LOPRESSOR) 25 MG tablet Take 1 tablet (25 mg total) by mouth 2 (two) times daily.  Marland Kitchen neomycin-polymyxin-hydrocortisone (CORTISPORIN) 3.5-10000-1 otic suspension Place 2-3 drops into both ears 2 (two) times daily as needed (ear pain).   . NP THYROID 90 MG tablet Take 90 mg by mouth daily.  . ondansetron (ZOFRAN-ODT) 4 MG disintegrating tablet Take 4 mg by mouth every 4 (four) hours as needed for nausea or vomiting.   . potassium chloride SA (K-DUR,KLOR-CON) 20 MEQ tablet Take 20 mEq by mouth 2 (two) times daily.   Marland Kitchen torsemide (DEMADEX) 20 MG tablet Take 1  tablet (20 mg total) by mouth 2 (two) times daily. (Patient taking differently: Take 20 mg by mouth daily. )  . warfarin (COUMADIN) 3 MG tablet Take 3 mg by mouth every Monday, Tuesday, Wednesday, Thursday, and Friday. And a 1.5 mg on saturday       Objective:   Today's Vitals: BP 140/80   Pulse 80   Ht 5\' 8"  (1.727 m)   Wt 175 lb (79.4 kg)   BMI 26.61 kg/m  Vitals with BMI 07/06/2019 06/16/2019 03/20/2019  Height 5\' 8"  5\' 8"  -  Weight 175 lbs 190 lbs -  BMI 54.00 86.7 -  Systolic 619 509 326  Diastolic 80 80 82  Pulse 80 103 -       Physical Exam She looks chronically unwell but is not in any acute distress.  Blood pressure for age is reasonable.  She is in atrial fibrillation clinically with a ventricular rate that is  controlled.  Lung fields are clear.  She is not clinically in decompensated heart failure.   Assessment     1. MENTAL RETARDATION   2. Gastroesophageal reflux disease, esophagitis presence not specified   3. Longstanding persistent atrial fibrillation   4. Chronic diastolic (congestive) heart failure (HCC)       Plan 1.  Continue with all current medications for her chronic conditions above, which appear to be stable. 2.  Blood work was taken today for complete metabolic panel and CBC. 3.  Follow-up in about 3 to 4 months.  Doree Albee, MD

## 2019-07-07 ENCOUNTER — Encounter: Payer: Self-pay | Admitting: Internal Medicine

## 2019-07-07 LAB — COMPLETE METABOLIC PANEL WITH GFR
AG Ratio: 1.5 (calc) (ref 1.0–2.5)
ALT: 10 U/L (ref 6–29)
AST: 12 U/L (ref 10–35)
Albumin: 4 g/dL (ref 3.6–5.1)
Alkaline phosphatase (APISO): 87 U/L (ref 37–153)
BUN/Creatinine Ratio: 14 (calc) (ref 6–22)
BUN: 20 mg/dL (ref 7–25)
CO2: 25 mmol/L (ref 20–32)
Calcium: 9.2 mg/dL (ref 8.6–10.4)
Chloride: 100 mmol/L (ref 98–110)
Creat: 1.44 mg/dL — ABNORMAL HIGH (ref 0.60–0.93)
GFR, Est African American: 40 mL/min/{1.73_m2} — ABNORMAL LOW (ref >=60)
GFR, Est Non African American: 35 mL/min/{1.73_m2} — ABNORMAL LOW (ref >=60)
Globulin: 2.7 g/dL (calc) (ref 1.9–3.7)
Glucose, Bld: 98 mg/dL (ref 65–99)
Potassium: 4 mmol/L (ref 3.5–5.3)
Sodium: 137 mmol/L (ref 135–146)
Total Bilirubin: 0.6 mg/dL (ref 0.2–1.2)
Total Protein: 6.7 g/dL (ref 6.1–8.1)

## 2019-07-07 LAB — CBC
HCT: 35.9 % (ref 35.0–45.0)
Hemoglobin: 12 g/dL (ref 11.7–15.5)
MCH: 32.2 pg (ref 27.0–33.0)
MCHC: 33.4 g/dL (ref 32.0–36.0)
MCV: 96.2 fL (ref 80.0–100.0)
MPV: 10.7 fL (ref 7.5–12.5)
Platelets: 171 10*3/uL (ref 140–400)
RBC: 3.73 10*6/uL — ABNORMAL LOW (ref 3.80–5.10)
RDW: 16.8 % — ABNORMAL HIGH (ref 11.0–15.0)
WBC: 5.3 10*3/uL (ref 3.8–10.8)

## 2019-07-07 LAB — POCT INR: INR: 2.3 (ref 2.0–3.0)

## 2019-07-11 ENCOUNTER — Encounter: Payer: Self-pay | Admitting: Internal Medicine

## 2019-07-11 LAB — POCT INR: INR: 2.9 (ref 2.0–3.0)

## 2019-07-11 NOTE — Progress Notes (Signed)
I spoke with the patient's niece, [name], who is the caregiver, and told her of stable blood work.  She is no longer anemic which is good news.  Follow-up as scheduled.

## 2019-07-18 ENCOUNTER — Ambulatory Visit (INDEPENDENT_AMBULATORY_CARE_PROVIDER_SITE_OTHER): Payer: Medicare Other | Admitting: Internal Medicine

## 2019-07-18 LAB — POCT INR: INR: 1.9 — AB (ref 2.0–3.0)

## 2019-07-26 ENCOUNTER — Telehealth (INDEPENDENT_AMBULATORY_CARE_PROVIDER_SITE_OTHER): Payer: Self-pay

## 2019-07-26 ENCOUNTER — Encounter: Payer: Self-pay | Admitting: Internal Medicine

## 2019-07-26 DIAGNOSIS — N183 Chronic kidney disease, stage 3 (moderate): Secondary | ICD-10-CM | POA: Diagnosis not present

## 2019-07-26 DIAGNOSIS — I5030 Unspecified diastolic (congestive) heart failure: Secondary | ICD-10-CM | POA: Diagnosis not present

## 2019-07-26 DIAGNOSIS — Z7901 Long term (current) use of anticoagulants: Secondary | ICD-10-CM | POA: Diagnosis not present

## 2019-07-26 DIAGNOSIS — I482 Chronic atrial fibrillation, unspecified: Secondary | ICD-10-CM | POA: Diagnosis not present

## 2019-07-26 DIAGNOSIS — Z9981 Dependence on supplemental oxygen: Secondary | ICD-10-CM

## 2019-07-26 DIAGNOSIS — L97811 Non-pressure chronic ulcer of other part of right lower leg limited to breakdown of skin: Secondary | ICD-10-CM | POA: Diagnosis not present

## 2019-07-26 DIAGNOSIS — F71 Moderate intellectual disabilities: Secondary | ICD-10-CM

## 2019-07-26 LAB — POCT INR: INR: 4.1 — AB (ref 2.0–3.0)

## 2019-07-26 NOTE — Telephone Encounter (Signed)
mdINR fax to office showed 4.1 result for INR.  Pt is to STOP warfarin today; Start back on Friday. Per Dr Anastasio Champion verbal note.

## 2019-08-03 ENCOUNTER — Telehealth (INDEPENDENT_AMBULATORY_CARE_PROVIDER_SITE_OTHER): Payer: Self-pay

## 2019-08-03 LAB — POCT INR

## 2019-08-03 NOTE — Telephone Encounter (Signed)
Pt niece/ caretaker , [name] was given verbal instructions.

## 2019-08-03 NOTE — Telephone Encounter (Signed)
Continue with the same dose of warfarin/Coumadin. 

## 2019-08-04 ENCOUNTER — Encounter: Payer: Self-pay | Admitting: Cardiovascular Disease

## 2019-08-04 ENCOUNTER — Telehealth (INDEPENDENT_AMBULATORY_CARE_PROVIDER_SITE_OTHER): Payer: Medicare Other | Admitting: Cardiovascular Disease

## 2019-08-04 VITALS — BP 135/69 | HR 80 | Ht 68.5 in | Wt 181.0 lb

## 2019-08-04 DIAGNOSIS — R002 Palpitations: Secondary | ICD-10-CM | POA: Diagnosis not present

## 2019-08-04 DIAGNOSIS — I25118 Atherosclerotic heart disease of native coronary artery with other forms of angina pectoris: Secondary | ICD-10-CM

## 2019-08-04 DIAGNOSIS — N183 Chronic kidney disease, stage 3 unspecified: Secondary | ICD-10-CM

## 2019-08-04 DIAGNOSIS — I4811 Longstanding persistent atrial fibrillation: Secondary | ICD-10-CM

## 2019-08-04 DIAGNOSIS — I5032 Chronic diastolic (congestive) heart failure: Secondary | ICD-10-CM

## 2019-08-04 NOTE — Patient Instructions (Signed)
Medication Instructions: Your physician recommends that you continue on your current medications as directed. Please refer to the Current Medication list given to you today.   Labwork: No e today  Procedures/Testing: None today  Follow-Up: 6 months either Phone visit or office visit with Dr.Koneswaran  Any Additional Special Instructions Will Be Listed Below (If Applicable).     If you need a refill on your cardiac medications before your next appointment, please call your pharmacy.      Thank you for choosing [facility] !

## 2019-08-04 NOTE — Progress Notes (Signed)
Virtual Visit via Telephone Note   This visit type was conducted due to national recommendations for restrictions regarding the COVID-19 Pandemic (e.g. social distancing) in an effort to limit this patient's exposure and mitigate transmission in our community.  Due to her co-morbid illnesses, this patient is at least at moderate risk for complications without adequate follow up.  This format is felt to be most appropriate for this patient at this time.  The patient did not have access to video technology/had technical difficulties with video requiring transitioning to audio format only (telephone).  All issues noted in this document were discussed and addressed.  No physical exam could be performed with this format.  Please refer to the patient's chart for her  consent to telehealth for [facility].   Date:  08/04/2019   ID:  [name], DOB 1941/09/06, MRN [id number]  Patient Location: Home Provider Location: Home  PCP:  Doree Albee, MD  Cardiologist:  Kate Sable, MD  Electrophysiologist:  None   Evaluation Performed:  Follow-Up Visit  Chief Complaint: Palpitations  History of Present Illness:    [name] is a 78 y.o. female with chronic diastolic heart failure, atrial flutter and fibrillation, and hypertension.  I spoke with her niece, [name], as well. Palpitations have improved with increase in Lopressor to 25 mg bid. Her leg swelling has improved. She denies chest pain.  The patient does not have symptoms concerning for COVID-19 infection (fever, chills, cough, or new shortness of breath).    Past Medical History:  Diagnosis Date  . Adenomatous polyps    -resected via colonoscopy in 2011  . Anemia   . Anxiety    MR  . Atrial fibrillation (HCC)    EF of 45%  . CHF (congestive heart failure) ([place])   . Chronic kidney disease   . Degenerative joint disease    status post right total hip arthroplasty  . Dysrhythmia   . GERD (gastroesophageal reflux  disease)    -Barrett's esophagus  . Glaucoma   . Glaucoma   . Gout   . Hyperlipidemia    Recent lipid profile is excellent without lipid-lowering therapy  . Hypertension   . Mental retardation   . Pneumonia   . Premature ventricular contractions   . Restless leg syndrome   . Shortness of breath dyspnea   . Wears dentures   . Wears glasses    Past Surgical History:  Procedure Laterality Date  . BIOPSY  02/22/2019   Procedure: BIOPSY;  Surgeon: Rogene Houston, MD;  [facility];  Service: Endoscopy;;  Esophageal biopies  . CATARACT EXTRACTION  2006   left eye  . COLONOSCOPY W/ POLYPECTOMY  2011  . COLONOSCOPY WITH PROPOFOL N/A 02/22/2019   Procedure: COLONOSCOPY WITH PROPOFOL;  Surgeon: Rogene Houston, MD;  [facility];  Service: Endoscopy;  Laterality: N/A;  . ESOPHAGOGASTRODUODENOSCOPY N/A 01/01/2016   Procedure: ESOPHAGOGASTRODUODENOSCOPY (EGD);  Surgeon: Rogene Houston, MD;  [facility];  Service: Endoscopy;  Laterality: N/A;  200  . ESOPHAGOGASTRODUODENOSCOPY (EGD) WITH PROPOFOL N/A 02/22/2019   Procedure: ESOPHAGOGASTRODUODENOSCOPY (EGD) WITH PROPOFOL;  Surgeon: Rogene Houston, MD;  [facility];  Service: Endoscopy;  Laterality: N/A;  . EYE SURGERY    . JOINT REPLACEMENT     x2  . MULTIPLE TOOTH EXTRACTIONS    . RADIOLOGY WITH ANESTHESIA Right 11/21/2015   Procedure: MRI OF RIGHT KNEE WITHOUT CONTAST    (RADIOLOGY WITH  ANESTHESIA);  Surgeon: Medication Radiologist, MD;  Location: [facility];  Service: Radiology;  Laterality: Right;  . RADIOLOGY WITH ANESTHESIA N/A 08/11/2018   Procedure: MRI of the BRAIN WITH ANESTHESIA WITHOUT CONTRAST;  Surgeon: Radiologist, Medication, MD;  Location: [facility];  Service: Radiology;  Laterality: N/A;  . TONSILLECTOMY    . TOTAL HIP ARTHROPLASTY  2006   Right     Current Meds  Medication Sig  . acetaminophen (TYLENOL) 500 MG tablet Take 500-1,000 mg by mouth every 6 (six) hours as needed for  moderate pain.   Marland Kitchen allopurinol (ZYLOPRIM) 100 MG tablet Take 100 mg by mouth at bedtime.   . Brinzolamide-Brimonidine (SIMBRINZA) 1-0.2 % SUSP Place 1 drop into both eyes 2 (two) times daily.   . Cholecalciferol (VITAMIN D3) 5000 units CAPS Take 5,000 Units by mouth daily.   Marland Kitchen esomeprazole (NEXIUM) 20 MG capsule Take 20 mg by mouth 2 (two) times daily before a meal.   . ferrous gluconate (FERGON) 324 MG tablet Take 324 mg by mouth daily.   . fluticasone (FLONASE) 50 MCG/ACT nasal spray Place 1 spray into both nostrils daily.  Marland Kitchen gabapentin (NEURONTIN) 300 MG capsule Take 300 mg at bedtime by mouth.  . latanoprost (XALATAN) 0.005 % ophthalmic solution Place 1 drop into both eyes at bedtime.    Marland Kitchen loratadine (CLARITIN) 10 MG tablet Take 10 mg by mouth daily.   . meclizine (ANTIVERT) 12.5 MG tablet Take 12.5 mg by mouth 2 (two) times daily as needed for dizziness.   . metoprolol tartrate (LOPRESSOR) 25 MG tablet Take 1 tablet (25 mg total) by mouth 2 (two) times daily.  Marland Kitchen neomycin-polymyxin-hydrocortisone (CORTISPORIN) 3.5-10000-1 otic suspension Place 2-3 drops into both ears 2 (two) times daily as needed (ear pain).   . NP THYROID 90 MG tablet Take 90 mg by mouth daily.  . ondansetron (ZOFRAN-ODT) 4 MG disintegrating tablet Take 4 mg by mouth every 4 (four) hours as needed for nausea or vomiting.   . potassium chloride SA (K-DUR,KLOR-CON) 20 MEQ tablet Take 20 mEq by mouth 2 (two) times daily.   Marland Kitchen torsemide (DEMADEX) 20 MG tablet Take 1 tablet (20 mg total) by mouth 2 (two) times daily. (Patient taking differently: Take 20 mg by mouth daily. )  . warfarin (COUMADIN) 3 MG tablet Take 3 mg by mouth every Monday, Tuesday, Wednesday, Thursday, and Friday. And a 1.5 mg on saturday     Allergies:   Patient has no known allergies.   Social History   Tobacco Use  . Smoking status: Never Smoker  . Smokeless tobacco: Never Used  Substance Use Topics  . Alcohol use: No  . Drug use: No     Family  Hx: The patient's family history is not on file.  ROS:   Please see the history of present illness.     All other systems reviewed and are negative.   Prior CV studies:   The following studies were reviewed today:  Echocardiogram performed on 04/12/2018 showed normal left ventricular systolic function and regional wall motion, LVEF 55 to 86%, diastolic dysfunction with high ventricular filling pressures, and mild aortic, mild mitral, and moderate tricuspid regurgitation.  She underwent a low risk nuclear stress test on 08/19/2018, EF 51%. There appeared to be a small distal anteroseptal myocardial infarction with no evidence of ischemia.   Labs/Other Tests and Data Reviewed:    EKG:  No ECG reviewed.  Recent Labs: 07/06/2019: ALT 10; BUN 20; Creat 1.44; Hemoglobin 12.0; Platelets  171; Potassium 4.0; Sodium 137   Recent Lipid Panel Lab Results  Component Value Date/Time   CHOL 132 07/27/2009   TRIG 69 07/27/2009   HDL 43 07/27/2009   LDLCALC 75 07/27/2009    Wt Readings from Last 3 Encounters:  08/04/19 181 lb (82.1 kg)  07/06/19 175 lb (79.4 kg)  06/16/19 190 lb (86.2 kg)     Objective:    Vital Signs:  BP 135/69   Pulse 80   Ht 5' 8.5" (1.74 m)   Wt 181 lb (82.1 kg)   BMI 27.12 kg/m    VITAL SIGNS:  reviewed  ASSESSMENT & PLAN:    1. Chronic diastolic heart failure:Most recent echocardiogram reviewed above. Currently on torsemide 20 mg daily and supplemental potassium.She takes an extra torsemide as needed for increased leg and feet swelling.  2.Longstanding persistent atrial fibrillation/flutter: Symptomatically improved. Continue Lopressor 25 mg twice daily.  This may need to be increased further. Systemically anticoagulated with warfarin.   3. Chronic kidney disease stage III: Renal function reviewed above. Monitored by PCP.  4. Coronary artery disease:Nuclear stress test reviewed above with evidence of small prior MI. No aspirin as she is  on warfarin. She is also on a beta-blocker.   COVID-19 Education: The signs and symptoms of COVID-19 were discussed with the patient and how to seek care for testing (follow up with PCP or arrange E-visit).  The importance of social distancing was discussed today.  Time:   Today, I have spent 5 minutes with the patient with telehealth technology discussing the above problems.     Medication Adjustments/Labs and Tests Ordered: Current medicines are reviewed at length with the patient today.  Concerns regarding medicines are outlined above.   Tests Ordered: No orders of the defined types were placed in this encounter.   Medication Changes: No orders of the defined types were placed in this encounter.   Follow Up:  Virtual Visit or In Person in 6 month(s)  Signed, Kate Sable, MD  08/04/2019 11:05 AM    [place]

## 2019-08-08 ENCOUNTER — Encounter: Payer: Self-pay | Admitting: Internal Medicine

## 2019-08-08 LAB — POCT INR: INR: 2.2 (ref 2.0–3.0)

## 2019-08-16 LAB — POCT INR

## 2019-08-17 ENCOUNTER — Telehealth (INDEPENDENT_AMBULATORY_CARE_PROVIDER_SITE_OTHER): Payer: Self-pay

## 2019-08-21 NOTE — Telephone Encounter (Signed)
Order faxed & signed.

## 2019-08-22 ENCOUNTER — Telehealth (INDEPENDENT_AMBULATORY_CARE_PROVIDER_SITE_OTHER): Payer: Self-pay

## 2019-08-22 ENCOUNTER — Encounter: Payer: Self-pay | Admitting: Internal Medicine

## 2019-08-22 DIAGNOSIS — Z7901 Long term (current) use of anticoagulants: Secondary | ICD-10-CM | POA: Diagnosis not present

## 2019-08-22 LAB — POCT INR: INR: 0.9 — AB (ref 2.0–3.0)

## 2019-08-22 NOTE — Telephone Encounter (Signed)
Niece [name] was given instructions to give additional dose today of 3 mg; resume regular dosing tomorrow. Also she wanted you to know that [name] is having diarrhea on/off for the pass 2-3 weeks. Dr.Rehman office canceled her last appt, but did not reschedule.

## 2019-08-22 NOTE — Telephone Encounter (Signed)
Tell patient to take extra 3 mg tablet today in addition to what she is already taken and continue with usual dosing from tomorrow.

## 2019-08-22 NOTE — Telephone Encounter (Signed)
Fax from [facility] of results is 0.9 today. What would you like to do at this time?

## 2019-08-31 ENCOUNTER — Telehealth (INDEPENDENT_AMBULATORY_CARE_PROVIDER_SITE_OTHER): Payer: Self-pay | Admitting: Nurse Practitioner

## 2019-08-31 ENCOUNTER — Other Ambulatory Visit: Payer: Self-pay | Admitting: Cardiovascular Disease

## 2019-08-31 NOTE — Telephone Encounter (Signed)
I attempted to call the patient's niece again regarding patient's INR level and medication changes.  The call did not go through to the patient's home phone number as listed.  I attempted to call the nieces personal number, but there was no answer.  I did leave a voicemail asking the needs to call us back.

## 2019-08-31 NOTE — Telephone Encounter (Signed)
Attempted to call this patient's niece regarding patient's INR being elevated. She did not answer the phone. I was able to speak to the patient and she told me to call back a little later. Will try to call again later.

## 2019-08-31 NOTE — Telephone Encounter (Signed)
This patient's caregiver, [name] did call back today.  I was able to tell the needs the patient's INR was a bit above goal.  The niece tells me the patient takes a 3 mg tablet 5 days a week.  I recommended that tomorrow the patient only takes half of a tablet, and that the caregiver should check the patient's INR again next week.  The caregiver tells me that she will make these changes.

## 2019-09-06 ENCOUNTER — Encounter: Payer: Self-pay | Admitting: Internal Medicine

## 2019-09-06 LAB — POCT INR: INR: 2.4 (ref 2.0–3.0)

## 2019-09-07 ENCOUNTER — Other Ambulatory Visit (INDEPENDENT_AMBULATORY_CARE_PROVIDER_SITE_OTHER): Payer: Self-pay | Admitting: Internal Medicine

## 2019-09-12 ENCOUNTER — Ambulatory Visit (INDEPENDENT_AMBULATORY_CARE_PROVIDER_SITE_OTHER): Payer: Medicare Other | Admitting: Internal Medicine

## 2019-09-12 ENCOUNTER — Encounter (INDEPENDENT_AMBULATORY_CARE_PROVIDER_SITE_OTHER): Payer: Self-pay | Admitting: Internal Medicine

## 2019-09-12 ENCOUNTER — Other Ambulatory Visit: Payer: Self-pay

## 2019-09-12 VITALS — BP 122/71 | HR 80 | Temp 98.0°F | Resp 18

## 2019-09-12 DIAGNOSIS — G629 Polyneuropathy, unspecified: Secondary | ICD-10-CM

## 2019-09-12 DIAGNOSIS — I25118 Atherosclerotic heart disease of native coronary artery with other forms of angina pectoris: Secondary | ICD-10-CM | POA: Diagnosis not present

## 2019-09-12 DIAGNOSIS — G609 Hereditary and idiopathic neuropathy, unspecified: Secondary | ICD-10-CM | POA: Diagnosis not present

## 2019-09-12 DIAGNOSIS — F79 Unspecified intellectual disabilities: Secondary | ICD-10-CM

## 2019-09-12 DIAGNOSIS — L03116 Cellulitis of left lower limb: Secondary | ICD-10-CM

## 2019-09-12 HISTORY — DX: Polyneuropathy, unspecified: G62.9

## 2019-09-12 MED ORDER — AMOXICILLIN-POT CLAVULANATE 875-125 MG PO TABS: 1.0000 | ORAL_TABLET | Freq: Two times a day (BID) | ORAL | 0 refills | Status: DC

## 2019-09-12 NOTE — Progress Notes (Addendum)
Metrics: Intervention Frequency ACO  Documented Smoking Status Yearly  Screened one or more times in 24 months  Cessation Counseling or  Active cessation medication Past 24 months  Past 24 months   Guideline developer: UpToDate (See UpToDate for funding source) Date Released: 2014       Wellness Office Visit  Subjective:  Patient ID: [name], female    DOB: 1940/12/24  Age: 78 y.o. MRN: [id number]  CC: This lady comes in because she has another wound in her left lower leg.  There is some redness around this area and the niece was concerned about it. HPI  The patient denies any pain, fever. She also requires a wheelchair and a face-to-face evaluation today.  The niece tells me that the patient has difficulty in grooming and wheelchair allows her to transport.  A walker is not sufficient alone.  She has these issues because of her peripheral  combined with intellectual disability Past Medical History:  Diagnosis Date  . Adenomatous polyps    -resected via colonoscopy in 2011  . Anemia   . Anxiety    MR  . Atrial fibrillation (HCC)    EF of 45%  . CHF (congestive heart failure) ([place])   . Chronic kidney disease   . Degenerative joint disease    status post right total hip arthroplasty  . Dysrhythmia   . GERD (gastroesophageal reflux disease)    -Barrett's esophagus  . Glaucoma   . Glaucoma   . Gout   . Hyperlipidemia    Recent lipid profile is excellent without lipid-lowering therapy  . Hypertension   . Mental retardation   . Peripheral neuropathy 09/12/2019  . Pneumonia   . Premature ventricular contractions   . Restless leg syndrome   . Shortness of breath dyspnea   . Wears dentures   . Wears glasses       History reviewed. No pertinent family history.  Social History   Social History Narrative   Single,lives by herself.A family member (niece) now with her 8am-2pm,other family members rest of day.   Social History   Tobacco Use  . Smoking status:  Never Smoker  . Smokeless tobacco: Never Used  Substance Use Topics  . Alcohol use: No    Current Meds  Medication Sig  . acetaminophen (TYLENOL) 500 MG tablet Take 500-1,000 mg by mouth every 6 (six) hours as needed for moderate pain.   Marland Kitchen allopurinol (ZYLOPRIM) 100 MG tablet Take 100 mg by mouth at bedtime.   . Brinzolamide-Brimonidine (SIMBRINZA) 1-0.2 % SUSP Place 1 drop into both eyes 2 (two) times daily.   . Cholecalciferol (VITAMIN D3) 5000 units CAPS Take 5,000 Units by mouth daily.   Marland Kitchen esomeprazole (NEXIUM) 20 MG capsule Take 20 mg by mouth 2 (two) times daily before a meal.   . ferrous gluconate (FERGON) 324 MG tablet Take 324 mg by mouth daily.   . fluticasone (FLONASE) 50 MCG/ACT nasal spray Place 1 spray into both nostrils daily.  Marland Kitchen gabapentin (NEURONTIN) 300 MG capsule Take 300 mg at bedtime by mouth.  . latanoprost (XALATAN) 0.005 % ophthalmic solution Place 1 drop into both eyes at bedtime.    Marland Kitchen loratadine (CLARITIN) 10 MG tablet Take 10 mg by mouth daily.   . meclizine (ANTIVERT) 12.5 MG tablet Take 12.5 mg by mouth 2 (two) times daily as needed for dizziness.   . metoprolol tartrate (LOPRESSOR) 25 MG tablet Take 1 tablet (25 mg total) by mouth 2 (two) times daily.  Marland Kitchen  neomycin-polymyxin-hydrocortisone (CORTISPORIN) 3.5-10000-1 otic suspension Place 2-3 drops into both ears 2 (two) times daily as needed (ear pain).   . NP THYROID 90 MG tablet Take 1 tablet by mouth once daily  . ondansetron (ZOFRAN-ODT) 4 MG disintegrating tablet Take 4 mg by mouth every 4 (four) hours as needed for nausea or vomiting.   . potassium chloride SA (K-DUR,KLOR-CON) 20 MEQ tablet Take 20 mEq by mouth 2 (two) times daily.   Marland Kitchen torsemide (DEMADEX) 20 MG tablet TAKE 1 TABLET BY MOUTH TWICE DAILY  . warfarin (COUMADIN) 3 MG tablet Take 3 mg by mouth every Monday, Tuesday, Wednesday, Thursday, and Friday. Take 1 tablet by mouth Monday through Thursday. Take 1/2 tablet by mouth on Friday.       Objective:   Today's Vitals: BP 122/71 (BP Location: Left Arm, Patient Position: Sitting, Cuff Size: Normal)   Pulse 80   Temp 98 F (36.7 C) (Temporal)   Resp 18   SpO2 99% Comment: on portable 3L of O2; with mask on top of [place] on face. Vitals with BMI 09/12/2019 08/04/2019 07/06/2019  Height - 5' 8.5" 5\' 8"   Weight - 181 lbs 175 lbs  BMI - 18.29 93.71  Systolic 696 789 381  Diastolic 71 69 80  Pulse 80 80 80     Physical Exam   She is afebrile.  She does have superficial wound in the left lower leg, medial aspect, measuring approximately 1 cm in length with surrounding erythema which could possibly be cellulitis.    Assessment   1. Intellectual disability   2. Idiopathic peripheral neuropathy   3. Left leg cellulitis       Tests ordered Orders Placed This Encounter  Procedures  . DME Wheelchair manual     Plan: 1. I filled out paperwork to hopefully get her a manual wheelchair. 2. As far as the cellulitis is concerned, we will send prescription for antibiotics to her pharmacy.  If she does not improve, the niece will let me know. 3. High-dose influenza vaccination was given today also.   Meds ordered this encounter  Medications  . amoxicillin-clavulanate (AUGMENTIN) 875-125 MG tablet    Sig: Take 1 tablet by mouth 2 (two) times daily.    Dispense:  20 tablet    Refill:  0    Nimish Luther Parody, MD

## 2019-09-13 ENCOUNTER — Ambulatory Visit (INDEPENDENT_AMBULATORY_CARE_PROVIDER_SITE_OTHER): Payer: Medicare Other | Admitting: Nurse Practitioner

## 2019-09-15 ENCOUNTER — Telehealth (INDEPENDENT_AMBULATORY_CARE_PROVIDER_SITE_OTHER): Payer: Self-pay

## 2019-09-15 LAB — POCT INR

## 2019-09-15 NOTE — Telephone Encounter (Signed)
Fax sent to the office on the INR results today are  2.3. (INR notifications range  Below 1.5 Above 4.9.)

## 2019-09-17 NOTE — Telephone Encounter (Signed)
Continue with same dose of Coumadin

## 2019-09-18 ENCOUNTER — Telehealth (INDEPENDENT_AMBULATORY_CARE_PROVIDER_SITE_OTHER): Payer: Self-pay

## 2019-09-18 NOTE — Telephone Encounter (Signed)
NIECES , [name] WAS CONTACTED [facility] OF INR RESULTS.

## 2019-09-18 NOTE — Telephone Encounter (Signed)
LETTER TO FAX TO [place] [employer] ; TO STATED SHE WILL NEED ARM CHAIR PADS OF WHEELCHAIR REPLACED. XA:[phone]

## 2019-09-19 ENCOUNTER — Encounter (INDEPENDENT_AMBULATORY_CARE_PROVIDER_SITE_OTHER): Payer: Self-pay | Admitting: Internal Medicine

## 2019-09-20 ENCOUNTER — Encounter: Payer: Self-pay | Admitting: Internal Medicine

## 2019-09-20 ENCOUNTER — Other Ambulatory Visit: Payer: Self-pay | Admitting: Cardiovascular Disease

## 2019-09-20 ENCOUNTER — Other Ambulatory Visit (INDEPENDENT_AMBULATORY_CARE_PROVIDER_SITE_OTHER): Payer: Self-pay | Admitting: Internal Medicine

## 2019-09-20 DIAGNOSIS — Z7901 Long term (current) use of anticoagulants: Secondary | ICD-10-CM | POA: Diagnosis not present

## 2019-09-20 LAB — POCT INR: INR: 2.7 (ref 2.0–3.0)

## 2019-09-25 ENCOUNTER — Telehealth (INDEPENDENT_AMBULATORY_CARE_PROVIDER_SITE_OTHER): Payer: Self-pay | Admitting: Nurse Practitioner

## 2019-09-25 NOTE — Telephone Encounter (Signed)
I was able to contact this patient's caregiver [name]), to remind him that we will be closed this Wednesday and Thursday due to the Thanksgiving holiday.  I did ask that the patient's INR be collected tomorrow so that we can address this prior to the holiday weekend.  [name] tells me she understands and she will get the INR tomorrow.

## 2019-09-26 ENCOUNTER — Encounter: Payer: Self-pay | Admitting: Internal Medicine

## 2019-09-26 ENCOUNTER — Telehealth (INDEPENDENT_AMBULATORY_CARE_PROVIDER_SITE_OTHER): Payer: Self-pay | Admitting: Nurse Practitioner

## 2019-09-26 LAB — POCT INR: INR: 2.5 (ref 2.0–3.0)

## 2019-09-26 NOTE — Telephone Encounter (Signed)
I was able to contact this patient's caregiver today regarding her INR result.  Patient's INR goal is between 2-3 and her INR today is 2.5.  Patient is at goal and caregiver was told to continue current dose of warfarin.  Caregiver verbalized understanding.

## 2019-10-02 ENCOUNTER — Other Ambulatory Visit (INDEPENDENT_AMBULATORY_CARE_PROVIDER_SITE_OTHER): Payer: Self-pay | Admitting: Internal Medicine

## 2019-10-02 ENCOUNTER — Telehealth (INDEPENDENT_AMBULATORY_CARE_PROVIDER_SITE_OTHER): Payer: Self-pay | Admitting: Internal Medicine

## 2019-10-02 MED ORDER — LORAZEPAM 0.5 MG PO TABS: 0.5000 mg | ORAL_TABLET | Freq: Two times a day (BID) | ORAL | 1 refills | Status: DC | PRN

## 2019-10-02 NOTE — Telephone Encounter (Signed)
done

## 2019-10-04 ENCOUNTER — Encounter: Payer: Self-pay | Admitting: Internal Medicine

## 2019-10-04 ENCOUNTER — Telehealth (INDEPENDENT_AMBULATORY_CARE_PROVIDER_SITE_OTHER): Payer: Self-pay

## 2019-10-04 LAB — POCT INR: INR: 3.1 — AB (ref 2.0–3.0)

## 2019-10-05 NOTE — Telephone Encounter (Signed)
Continue with same dose of Coumadin

## 2019-10-05 NOTE — Telephone Encounter (Signed)
Return call to [facility] to continue same dose of warfarin.

## 2019-10-08 ENCOUNTER — Other Ambulatory Visit (INDEPENDENT_AMBULATORY_CARE_PROVIDER_SITE_OTHER): Payer: Self-pay | Admitting: Internal Medicine

## 2019-10-10 DIAGNOSIS — D631 Anemia in chronic kidney disease: Secondary | ICD-10-CM

## 2019-10-10 DIAGNOSIS — Z9981 Dependence on supplemental oxygen: Secondary | ICD-10-CM

## 2019-10-10 DIAGNOSIS — E785 Hyperlipidemia, unspecified: Secondary | ICD-10-CM

## 2019-10-10 DIAGNOSIS — I482 Chronic atrial fibrillation, unspecified: Secondary | ICD-10-CM | POA: Diagnosis not present

## 2019-10-10 DIAGNOSIS — Z9181 History of falling: Secondary | ICD-10-CM

## 2019-10-10 DIAGNOSIS — N183 Chronic kidney disease, stage 3 unspecified: Secondary | ICD-10-CM

## 2019-10-10 DIAGNOSIS — E559 Vitamin D deficiency, unspecified: Secondary | ICD-10-CM

## 2019-10-10 DIAGNOSIS — I5030 Unspecified diastolic (congestive) heart failure: Secondary | ICD-10-CM

## 2019-10-10 DIAGNOSIS — Z7901 Long term (current) use of anticoagulants: Secondary | ICD-10-CM

## 2019-10-10 DIAGNOSIS — F79 Unspecified intellectual disabilities: Secondary | ICD-10-CM

## 2019-10-10 DIAGNOSIS — I130 Hypertensive heart and chronic kidney disease with heart failure and stage 1 through stage 4 chronic kidney disease, or unspecified chronic kidney disease: Secondary | ICD-10-CM | POA: Diagnosis not present

## 2019-10-10 DIAGNOSIS — K219 Gastro-esophageal reflux disease without esophagitis: Secondary | ICD-10-CM

## 2019-10-12 ENCOUNTER — Encounter: Payer: Self-pay | Admitting: Internal Medicine

## 2019-10-12 ENCOUNTER — Telehealth (INDEPENDENT_AMBULATORY_CARE_PROVIDER_SITE_OTHER): Payer: Self-pay | Admitting: Nurse Practitioner

## 2019-10-12 ENCOUNTER — Telehealth (INDEPENDENT_AMBULATORY_CARE_PROVIDER_SITE_OTHER): Payer: Self-pay

## 2019-10-12 LAB — POCT INR: INR: 3.7 — AB (ref 2.0–3.0)

## 2019-10-12 NOTE — Telephone Encounter (Signed)
[name] is calling with INR # 3.7

## 2019-10-12 NOTE — Telephone Encounter (Signed)
Patient's INR has not been resulted yet today. I called this patient's niece and she tells me she will check the INR now and let us know of the value.

## 2019-10-12 NOTE — Telephone Encounter (Signed)
Continue with the dose as before for the time being and repeat next week.

## 2019-10-17 NOTE — Telephone Encounter (Signed)
Denise given instructions to STOP warfarin for 2 days; then resume same dose as directed.

## 2019-10-17 NOTE — Telephone Encounter (Signed)
Tell the patient to stop Coumadin for today and tomorrow and then resume the same dose as before.

## 2019-10-17 NOTE — Telephone Encounter (Signed)
Today INR is 3.7. Do she continue same dose ?

## 2019-10-20 ENCOUNTER — Encounter: Payer: Self-pay | Admitting: Internal Medicine

## 2019-10-20 LAB — POCT INR: INR: 2.4 (ref 2.0–3.0)

## 2019-10-23 ENCOUNTER — Other Ambulatory Visit (INDEPENDENT_AMBULATORY_CARE_PROVIDER_SITE_OTHER): Payer: Self-pay | Admitting: Nurse Practitioner

## 2019-10-23 ENCOUNTER — Telehealth (INDEPENDENT_AMBULATORY_CARE_PROVIDER_SITE_OTHER): Payer: Self-pay | Admitting: Nurse Practitioner

## 2019-10-23 NOTE — Telephone Encounter (Signed)
Patient's INR results received.  She is at goal (2.4).  Patient's niece was told to continue on same dose that patient has been taking.  She was also encouraged to check INR again next week.

## 2019-11-01 ENCOUNTER — Telehealth (INDEPENDENT_AMBULATORY_CARE_PROVIDER_SITE_OTHER): Payer: Self-pay

## 2019-11-01 NOTE — Telephone Encounter (Signed)
Called INR was 4.0; verbal instructions per Dr Anastasio Champion was to STOP dose  the next 2 daily doses, Then resume on 3 day. Pt will start over dose for Monday; being she does not take on weekends.

## 2019-11-07 ENCOUNTER — Encounter (HOSPITAL_COMMUNITY): Payer: Self-pay | Admitting: *Deleted

## 2019-11-07 ENCOUNTER — Ambulatory Visit (INDEPENDENT_AMBULATORY_CARE_PROVIDER_SITE_OTHER): Payer: Medicare Other | Admitting: Internal Medicine

## 2019-11-07 ENCOUNTER — Inpatient Hospital Stay (HOSPITAL_COMMUNITY)
Admission: EM | Admit: 2019-11-07 | Discharge: 2019-11-18 | DRG: 871 | Disposition: A | Discharge status: Home Health | Payer: Medicare Other | Source: Ambulatory Visit | Attending: Family Medicine | Admitting: Family Medicine

## 2019-11-07 ENCOUNTER — Encounter (INDEPENDENT_AMBULATORY_CARE_PROVIDER_SITE_OTHER): Payer: Self-pay | Admitting: Internal Medicine

## 2019-11-07 ENCOUNTER — Ambulatory Visit
Admission: EM | Admit: 2019-11-07 | Discharge: 2019-11-07 | Disposition: A | Discharge status: Home / Self Care | Payer: Medicare Other | Source: Home / Self Care

## 2019-11-07 ENCOUNTER — Other Ambulatory Visit: Payer: Self-pay

## 2019-11-07 ENCOUNTER — Emergency Department (HOSPITAL_COMMUNITY): Payer: Medicare Other

## 2019-11-07 DIAGNOSIS — I4811 Longstanding persistent atrial fibrillation: Secondary | ICD-10-CM | POA: Diagnosis present

## 2019-11-07 DIAGNOSIS — E876 Hypokalemia: Secondary | ICD-10-CM | POA: Diagnosis not present

## 2019-11-07 DIAGNOSIS — F79 Unspecified intellectual disabilities: Secondary | ICD-10-CM | POA: Diagnosis present

## 2019-11-07 DIAGNOSIS — J9611 Chronic respiratory failure with hypoxia: Secondary | ICD-10-CM | POA: Diagnosis present

## 2019-11-07 DIAGNOSIS — Z9981 Dependence on supplemental oxygen: Secondary | ICD-10-CM

## 2019-11-07 DIAGNOSIS — N179 Acute kidney failure, unspecified: Secondary | ICD-10-CM | POA: Diagnosis present

## 2019-11-07 DIAGNOSIS — B9562 Methicillin resistant Staphylococcus aureus infection as the cause of diseases classified elsewhere: Secondary | ICD-10-CM | POA: Diagnosis present

## 2019-11-07 DIAGNOSIS — I480 Paroxysmal atrial fibrillation: Secondary | ICD-10-CM | POA: Diagnosis not present

## 2019-11-07 DIAGNOSIS — I4891 Unspecified atrial fibrillation: Secondary | ICD-10-CM | POA: Diagnosis not present

## 2019-11-07 DIAGNOSIS — U071 COVID-19: Secondary | ICD-10-CM | POA: Diagnosis present

## 2019-11-07 DIAGNOSIS — R404 Transient alteration of awareness: Secondary | ICD-10-CM | POA: Diagnosis not present

## 2019-11-07 DIAGNOSIS — I5043 Acute on chronic combined systolic (congestive) and diastolic (congestive) heart failure: Secondary | ICD-10-CM | POA: Diagnosis present

## 2019-11-07 DIAGNOSIS — Z7901 Long term (current) use of anticoagulants: Secondary | ICD-10-CM | POA: Diagnosis not present

## 2019-11-07 DIAGNOSIS — R6520 Severe sepsis without septic shock: Secondary | ICD-10-CM | POA: Diagnosis present

## 2019-11-07 DIAGNOSIS — M109 Gout, unspecified: Secondary | ICD-10-CM | POA: Diagnosis present

## 2019-11-07 DIAGNOSIS — E875 Hyperkalemia: Secondary | ICD-10-CM | POA: Diagnosis present

## 2019-11-07 DIAGNOSIS — R6250 Unspecified lack of expected normal physiological development in childhood: Secondary | ICD-10-CM | POA: Diagnosis present

## 2019-11-07 DIAGNOSIS — B9561 Methicillin susceptible Staphylococcus aureus infection as the cause of diseases classified elsewhere: Secondary | ICD-10-CM

## 2019-11-07 DIAGNOSIS — E039 Hypothyroidism, unspecified: Secondary | ICD-10-CM | POA: Diagnosis present

## 2019-11-07 DIAGNOSIS — E872 Acidosis, unspecified: Secondary | ICD-10-CM

## 2019-11-07 DIAGNOSIS — I509 Heart failure, unspecified: Secondary | ICD-10-CM

## 2019-11-07 DIAGNOSIS — J189 Pneumonia, unspecified organism: Secondary | ICD-10-CM | POA: Diagnosis not present

## 2019-11-07 DIAGNOSIS — R0989 Other specified symptoms and signs involving the circulatory and respiratory systems: Secondary | ICD-10-CM | POA: Diagnosis not present

## 2019-11-07 DIAGNOSIS — E785 Hyperlipidemia, unspecified: Secondary | ICD-10-CM | POA: Diagnosis present

## 2019-11-07 DIAGNOSIS — Z452 Encounter for adjustment and management of vascular access device: Secondary | ICD-10-CM | POA: Diagnosis not present

## 2019-11-07 DIAGNOSIS — A4189 Other specified sepsis: Principal | ICD-10-CM | POA: Diagnosis present

## 2019-11-07 DIAGNOSIS — I4819 Other persistent atrial fibrillation: Secondary | ICD-10-CM | POA: Diagnosis not present

## 2019-11-07 DIAGNOSIS — R791 Abnormal coagulation profile: Secondary | ICD-10-CM | POA: Diagnosis present

## 2019-11-07 DIAGNOSIS — R109 Unspecified abdominal pain: Secondary | ICD-10-CM

## 2019-11-07 DIAGNOSIS — I420 Dilated cardiomyopathy: Secondary | ICD-10-CM | POA: Diagnosis not present

## 2019-11-07 DIAGNOSIS — R7881 Bacteremia: Secondary | ICD-10-CM | POA: Diagnosis present

## 2019-11-07 DIAGNOSIS — Z7401 Bed confinement status: Secondary | ICD-10-CM | POA: Diagnosis not present

## 2019-11-07 DIAGNOSIS — R509 Fever, unspecified: Secondary | ICD-10-CM

## 2019-11-07 DIAGNOSIS — I110 Hypertensive heart disease with heart failure: Secondary | ICD-10-CM | POA: Diagnosis not present

## 2019-11-07 DIAGNOSIS — Z8249 Family history of ischemic heart disease and other diseases of the circulatory system: Secondary | ICD-10-CM

## 2019-11-07 DIAGNOSIS — J1282 Pneumonia due to coronavirus disease 2019: Secondary | ICD-10-CM

## 2019-11-07 DIAGNOSIS — R29898 Other symptoms and signs involving the musculoskeletal system: Secondary | ICD-10-CM | POA: Diagnosis not present

## 2019-11-07 DIAGNOSIS — N1832 Chronic kidney disease, stage 3b: Secondary | ICD-10-CM | POA: Diagnosis present

## 2019-11-07 DIAGNOSIS — J90 Pleural effusion, not elsewhere classified: Secondary | ICD-10-CM | POA: Diagnosis not present

## 2019-11-07 DIAGNOSIS — I130 Hypertensive heart and chronic kidney disease with heart failure and stage 1 through stage 4 chronic kidney disease, or unspecified chronic kidney disease: Secondary | ICD-10-CM | POA: Diagnosis present

## 2019-11-07 DIAGNOSIS — Z96641 Presence of right artificial hip joint: Secondary | ICD-10-CM | POA: Diagnosis present

## 2019-11-07 DIAGNOSIS — Z20822 Contact with and (suspected) exposure to covid-19: Secondary | ICD-10-CM | POA: Diagnosis not present

## 2019-11-07 DIAGNOSIS — E860 Dehydration: Secondary | ICD-10-CM | POA: Diagnosis not present

## 2019-11-07 DIAGNOSIS — I5032 Chronic diastolic (congestive) heart failure: Secondary | ICD-10-CM | POA: Diagnosis not present

## 2019-11-07 DIAGNOSIS — I5021 Acute systolic (congestive) heart failure: Secondary | ICD-10-CM | POA: Diagnosis not present

## 2019-11-07 DIAGNOSIS — R0981 Nasal congestion: Secondary | ICD-10-CM | POA: Diagnosis not present

## 2019-11-07 DIAGNOSIS — R0902 Hypoxemia: Secondary | ICD-10-CM | POA: Diagnosis not present

## 2019-11-07 DIAGNOSIS — Z66 Do not resuscitate: Secondary | ICD-10-CM | POA: Diagnosis not present

## 2019-11-07 DIAGNOSIS — Z79899 Other long term (current) drug therapy: Secondary | ICD-10-CM

## 2019-11-07 DIAGNOSIS — N183 Chronic kidney disease, stage 3 unspecified: Secondary | ICD-10-CM

## 2019-11-07 DIAGNOSIS — I5033 Acute on chronic diastolic (congestive) heart failure: Secondary | ICD-10-CM

## 2019-11-07 LAB — URINALYSIS, ROUTINE W REFLEX MICROSCOPIC
Bacteria, UA: NONE SEEN
Bilirubin Urine: NEGATIVE
Glucose, UA: NEGATIVE mg/dL
Ketones, ur: NEGATIVE mg/dL
Leukocytes,Ua: NEGATIVE
Nitrite: NEGATIVE
Protein, ur: NEGATIVE mg/dL
Specific Gravity, Urine: 1.01 (ref 1.005–1.030)
pH: 5 (ref 5.0–8.0)

## 2019-11-07 LAB — CBC WITH DIFFERENTIAL/PLATELET
Abs Immature Granulocytes: 0.08 10*3/uL — ABNORMAL HIGH (ref 0.00–0.07)
Basophils Absolute: 0 10*3/uL (ref 0.0–0.1)
Basophils Relative: 0 %
Eosinophils Absolute: 0 10*3/uL (ref 0.0–0.5)
Eosinophils Relative: 0 %
HCT: 35.4 % — ABNORMAL LOW (ref 36.0–46.0)
Hemoglobin: 11.2 g/dL — ABNORMAL LOW (ref 12.0–15.0)
Immature Granulocytes: 1 %
Lymphocytes Relative: 5 %
Lymphs Abs: 0.6 10*3/uL — ABNORMAL LOW (ref 0.7–4.0)
MCH: 33.8 pg (ref 26.0–34.0)
MCHC: 31.6 g/dL (ref 30.0–36.0)
MCV: 106.9 fL — ABNORMAL HIGH (ref 80.0–100.0)
Monocytes Absolute: 0.8 10*3/uL (ref 0.1–1.0)
Monocytes Relative: 6 %
Neutro Abs: 12 10*3/uL — ABNORMAL HIGH (ref 1.7–7.7)
Neutrophils Relative %: 88 %
Platelets: 116 10*3/uL — ABNORMAL LOW (ref 150–400)
RBC: 3.31 MIL/uL — ABNORMAL LOW (ref 3.87–5.11)
RDW: 16.1 % — ABNORMAL HIGH (ref 11.5–15.5)
WBC: 13.5 10*3/uL — ABNORMAL HIGH (ref 4.0–10.5)
nRBC: 0 % (ref 0.0–0.2)

## 2019-11-07 LAB — COMPREHENSIVE METABOLIC PANEL
ALT: 14 U/L (ref 0–44)
AST: 26 U/L (ref 15–41)
Albumin: 3.3 g/dL — ABNORMAL LOW (ref 3.5–5.0)
Alkaline Phosphatase: 61 U/L (ref 38–126)
Anion gap: 16 — ABNORMAL HIGH (ref 5–15)
BUN: 15 mg/dL (ref 8–23)
CO2: 32 mmol/L (ref 22–32)
Calcium: 8.3 mg/dL — ABNORMAL LOW (ref 8.9–10.3)
Chloride: 90 mmol/L — ABNORMAL LOW (ref 98–111)
Creatinine, Ser: 1.43 mg/dL — ABNORMAL HIGH (ref 0.44–1.00)
GFR calc Af Amer: 41 mL/min — ABNORMAL LOW (ref >60)
GFR calc non Af Amer: 35 mL/min — ABNORMAL LOW (ref >60)
Glucose, Bld: 99 mg/dL (ref 70–99)
Potassium: 2.5 mmol/L — CL (ref 3.5–5.1)
Sodium: 138 mmol/L (ref 135–145)
Total Bilirubin: 1.3 mg/dL — ABNORMAL HIGH (ref 0.3–1.2)
Total Protein: 6.5 g/dL (ref 6.5–8.1)

## 2019-11-07 LAB — LIPASE, BLOOD: Lipase: 15 U/L (ref 11–51)

## 2019-11-07 LAB — POC SARS CORONAVIRUS 2 AG -  ED: SARS Coronavirus 2 Ag: POSITIVE — AB

## 2019-11-07 LAB — BRAIN NATRIURETIC PEPTIDE: B Natriuretic Peptide: 435 pg/mL — ABNORMAL HIGH (ref 0.0–100.0)

## 2019-11-07 LAB — MAGNESIUM: Magnesium: 1.1 mg/dL — ABNORMAL LOW (ref 1.7–2.4)

## 2019-11-07 LAB — PROTIME-INR
INR: 1.6 — ABNORMAL HIGH (ref 0.8–1.2)
Prothrombin Time: 18.7 seconds — ABNORMAL HIGH (ref 11.4–15.2)

## 2019-11-07 LAB — LACTIC ACID, PLASMA
Lactic Acid, Venous: 3.6 mmol/L (ref 0.5–1.9)
Lactic Acid, Venous: 2.1 mmol/L (ref 0.5–1.9)

## 2019-11-07 LAB — PROCALCITONIN: Procalcitonin: 1.55 ng/mL

## 2019-11-07 MED ORDER — ACETAMINOPHEN 325 MG PO TABS
650.0000 mg | ORAL_TABLET | Freq: Four times a day (QID) | ORAL | Status: DC | PRN
  Administered 2019-11-08 – 2019-11-15 (×9): 650 mg via ORAL
  Filled 2019-11-07 (×10): qty 2

## 2019-11-07 MED ORDER — MAGNESIUM SULFATE 2 GM/50ML IV SOLN
2.0000 g | Freq: Once | INTRAVENOUS | Status: AC
  Administered 2019-11-07: 2 g via INTRAVENOUS
  Filled 2019-11-07: qty 50

## 2019-11-07 MED ORDER — ACETAMINOPHEN 650 MG RE SUPP: 650.0000 mg | Freq: Four times a day (QID) | RECTAL | Status: DC | PRN

## 2019-11-07 MED ORDER — SODIUM CHLORIDE 0.9% FLUSH
3.0000 mL | Freq: Two times a day (BID) | INTRAVENOUS | Status: DC
  Administered 2019-11-07 – 2019-11-18 (×15): 3 mL via INTRAVENOUS

## 2019-11-07 MED ORDER — SODIUM CHLORIDE 0.9 % IV SOLN: Freq: Once | INTRAVENOUS | Status: AC

## 2019-11-07 MED ORDER — LATANOPROST 0.005 % OP SOLN
1.0000 [drp] | Freq: Every day | OPHTHALMIC | Status: DC
  Administered 2019-11-07 – 2019-11-17 (×11): 1 [drp] via OPHTHALMIC
  Filled 2019-11-07 (×2): qty 2.5

## 2019-11-07 MED ORDER — POTASSIUM CHLORIDE CRYS ER 20 MEQ PO TBCR
20.0000 meq | EXTENDED_RELEASE_TABLET | Freq: Two times a day (BID) | ORAL | Status: DC
  Filled 2019-11-07: qty 1

## 2019-11-07 MED ORDER — TORSEMIDE 20 MG PO TABS
20.0000 mg | ORAL_TABLET | Freq: Two times a day (BID) | ORAL | Status: DC
  Administered 2019-11-07 – 2019-11-16 (×17): 20 mg via ORAL
  Filled 2019-11-07 (×17): qty 1

## 2019-11-07 MED ORDER — POTASSIUM CHLORIDE CRYS ER 20 MEQ PO TBCR
40.0000 meq | EXTENDED_RELEASE_TABLET | Freq: Once | ORAL | Status: AC
  Administered 2019-11-07: 40 meq via ORAL
  Filled 2019-11-07: qty 2

## 2019-11-07 MED ORDER — SODIUM CHLORIDE 0.9 % IV BOLUS
250.0000 mL | Freq: Once | INTRAVENOUS | Status: AC
  Administered 2019-11-07: 250 mL via INTRAVENOUS

## 2019-11-07 MED ORDER — METOPROLOL TARTRATE 25 MG PO TABS
25.0000 mg | ORAL_TABLET | Freq: Two times a day (BID) | ORAL | Status: AC
  Administered 2019-11-07 – 2019-11-10 (×7): 25 mg via ORAL
  Filled 2019-11-07 (×7): qty 1

## 2019-11-07 MED ORDER — POTASSIUM CHLORIDE 10 MEQ/100ML IV SOLN
10.0000 meq | INTRAVENOUS | Status: AC
  Administered 2019-11-07 (×3): 10 meq via INTRAVENOUS
  Filled 2019-11-07 (×3): qty 100

## 2019-11-07 MED ORDER — WARFARIN - PHARMACIST DOSING INPATIENT: Freq: Every day | Status: DC

## 2019-11-07 NOTE — ED Notes (Addendum)
Pt's O2 sats are 83-87% on 3L [place]. Pt is pale. Per Lestine Box, [place], EMS is called for pt.

## 2019-11-07 NOTE — ED Notes (Signed)
CRITICAL VALUE ALERT  Critical Value:  K 2.5  Date & Time Notied:  11/07/19 1638  Provider Notified: Conard Novak, PA  Orders Received/Actions taken: na

## 2019-11-07 NOTE — ED Notes (Addendum)
Niece states pt with a lot of mucus and with dry heaves since yesterday.  PCP instructed pt to go to Urgent care where from Urgent Care sent here due to RA sat at 83%.  Niece states no known Covid exposure over the last month.

## 2019-11-07 NOTE — ED Notes (Signed)
Pt with wound to right LE, multiple bandaids noted to bilateral feet and toes.

## 2019-11-07 NOTE — H&P (Signed)
History and Physical    PLEASE NOTE THAT DRAGON DICTATION SOFTWARE WAS USED IN THE CONSTRUCTION OF THIS NOTE.   [name] NWG:[id number] DOB: [birth year] DOA: 11/07/2019  PCP: Laura Albee, MD Patient coming from: home, where she lives with her niece  I have personally briefly reviewed patient's old medical records in [place]  Chief Complaint: Objective fever  HPI: [name] is a 79 y.o. female with medical history significant for chronic hypoxic respiratory failure in the setting of chronic diastolic heart failure on 3 L continuous supplemental oxygen, paroxysmal atrial fibrillation chronically anticoagulated on warfarin, hypertension, stage III chronic kidney disease with baseline creatinine of 1.4-1.5, developmental delay, who is admitted to [facility] on 11/07/2019 with COVID-19 pneumonia after resenting from home to [facility] emergency department for evaluation of objective fever.   In the setting of history of developmental delay associated with baseline mental status, the following history is obtained via my discussions with the emergency department physician as well as via chart review.  Per the patient's niece, with whom the patient lives, multiple family members to whom the patient has been exposed over the last week have been experiencing new onset rhinitis, rhinorrhea, sore throat, shortness of breath and subjective fever, although niece reports that none of these family members have been tested for Covid at this time.  Subsequent to the above symptoms experienced by patient's family members, the patient reportedly developed an objective fever over the last day, with temperature max at home noted to be 102-103 F.  [name] reportedly exhibited some dry heaving over that time in the absence of any diarrhea or rash.  Per niece, no increase in patient's supplemental oxygen demands relative to her baseline requirement of 3 L continuous nasal cannula.  Patient has  undergone no recent traveling.  Past medical history is notable for a history of chronic diastolic heart failure.  Most recent echocardiogram was performed in June 2019 and showed left ventricle associated with normal cavity size and normal wall thickness, LVEF 55 to 60%, no focal wall motion abnormalities, but showed evidence of diastolic dysfunction as well as mild mitral regurgitation and moderate tricuspid regurgitation.  Outpatient diuretic regimen consists of torsemide 20 mg p.o. twice daily.  While on this diuretic, the patient is also prescribed potassium chloride 20 mEq p.o. twice daily.  However, the patient's niece reports that the patient ran out of her potassium supplementation approximately 1 week ago, and has been unable to subsequently fill this prescription.    ED Course:  Vital signs in the ED were notable for the following: Temperature max 97.6; heart rate ranged from 77-1 05; blood pressure ranged from 110/61-120 9/73; respiratory rate 17-23; oxygen saturation 94 to 97% on baseline 3 L nasal cannula.  Labs were notable for the following: CMP notable for the following: Sodium 138, potassium 2.5, chloride 90, bicarbonate 32, creatinine 1.43 relative to most recent prior creatinine data point of 1.44 on 07/06/2019, liver enzymes found to be within normal limits.  Magnesium 1.1.  BNP 435 compared to most recent prior value of 219 on 04/11/2018.  CBC notable for white blood cell count of 13,500, hemoglobin 11.  Lactic acid 3.6.  INR 1.6.  Urinalysis showed no evidence of white blood cells, and was negative for leukocyte esterase and nitrates.  Rapid COVID-19 antigen test performed in the ED this evening was found to be positive.  Chest x-ray, per final radiology report, showed evidence of bilateral airspace opacities consistent  with Covid pneumonia versus heart failure, and also showed small bilateral pleural effusions.  Interpretation of EKG performed in the ED was complicated by presence of  artifact, although it appeared to demonstrate atrial fibrillation with ventricular rate 112, nonspecific T wave inversion in lead III, and no evidence of ST changes.  While in the ED, the following were administered: In the setting of elevated presenting lactic acid level, the patient received a 250 cc normal saline bolus.  Additionally, she received potassium chloride 40 mEq p.o. x1, potassium chloride 30 mg IV over 3 hours x 1, and magnesium sulfate 2 g IV over 2 hours x 1.  Subsequently, the patient was admitted to the med telemetry floor at [facility] for further evaluation and management of COVID-19 pneumonia as well as multiple electrolyte abnormalities including hypokalemia and hypomagnesemia.    Review of Systems: As per HPI otherwise 10 point review of systems negative.   Past Medical History:  Diagnosis Date  . Adenomatous polyps    -resected via colonoscopy in 2011  . Anemia   . Anxiety    MR  . Atrial fibrillation (HCC)    EF of 45%  . CHF (congestive heart failure) ([place])   . Chronic kidney disease   . Degenerative joint disease    status post right total hip arthroplasty  . Dysrhythmia   . GERD (gastroesophageal reflux disease)    -Barrett's esophagus  . Glaucoma   . Glaucoma   . Gout   . Hyperlipidemia    Recent lipid profile is excellent without lipid-lowering therapy  . Hypertension   . Mental retardation   . Peripheral neuropathy 09/12/2019  . Pneumonia   . Premature ventricular contractions   . Restless leg syndrome   . Shortness of breath dyspnea   . Wears dentures   . Wears glasses     Past Surgical History:  Procedure Laterality Date  . BIOPSY  02/22/2019   Procedure: BIOPSY;  Surgeon: Rogene Houston, MD;  [facility];  Service: Endoscopy;;  Esophageal biopies  . CATARACT EXTRACTION  2006   left eye  . COLONOSCOPY W/ POLYPECTOMY  2011  . COLONOSCOPY WITH PROPOFOL N/A 02/22/2019   Procedure: COLONOSCOPY WITH PROPOFOL;  Surgeon: Rogene Houston, MD;  [facility];  Service: Endoscopy;  Laterality: N/A;  . ESOPHAGOGASTRODUODENOSCOPY N/A 01/01/2016   Procedure: ESOPHAGOGASTRODUODENOSCOPY (EGD);  Surgeon: Rogene Houston, MD;  [facility];  Service: Endoscopy;  Laterality: N/A;  200  . ESOPHAGOGASTRODUODENOSCOPY (EGD) WITH PROPOFOL N/A 02/22/2019   Procedure: ESOPHAGOGASTRODUODENOSCOPY (EGD) WITH PROPOFOL;  Surgeon: Rogene Houston, MD;  [facility];  Service: Endoscopy;  Laterality: N/A;  . EYE SURGERY    . JOINT REPLACEMENT     x2  . MULTIPLE TOOTH EXTRACTIONS    . RADIOLOGY WITH ANESTHESIA Right 11/21/2015   Procedure: MRI OF RIGHT KNEE WITHOUT CONTAST    (RADIOLOGY WITH ANESTHESIA);  Surgeon: Medication Radiologist, MD;  Location: [place];  Service: Radiology;  Laterality: Right;  . RADIOLOGY WITH ANESTHESIA N/A 08/11/2018   Procedure: MRI of the BRAIN WITH ANESTHESIA WITHOUT CONTRAST;  Surgeon: Radiologist, Medication, MD;  Location: [place];  Service: Radiology;  Laterality: N/A;  . TONSILLECTOMY    . TOTAL HIP ARTHROPLASTY  2006   Right    Social History:  reports that she has never smoked. She has never used smokeless tobacco. She reports that she does not drink alcohol or use drugs.   No  Known Allergies  History reviewed. No pertinent family history.    Prior to Admission medications   Medication Sig Start Date End Date Taking? Authorizing Provider  acetaminophen (TYLENOL) 500 MG tablet Take 500-1,000 mg by mouth every 6 (six) hours as needed for moderate pain.    Yes [provider]  allopurinol (ZYLOPRIM) 100 MG tablet Take 100 mg by mouth at bedtime.    Yes [provider]  Brinzolamide-Brimonidine (SIMBRINZA) 1-0.2 % SUSP Place 1 drop into both eyes 2 (two) times daily.    Yes [provider]  Cholecalciferol (VITAMIN D3) 5000 units CAPS Take 5,000 Units by mouth daily.    Yes [provider]  esomeprazole (NEXIUM) 20 MG capsule Take 20 mg  by mouth 2 (two) times daily before a meal.    Yes [provider]  ferrous gluconate (FERGON) 324 MG tablet Take 324 mg by mouth 3 (three) times a week.    Yes [provider]  fluticasone (FLONASE) 50 MCG/ACT nasal spray Place 1 spray into both nostrils daily.   Yes [provider]  gabapentin (NEURONTIN) 300 MG capsule Take 300 mg at bedtime by mouth. 08/23/17  Yes [provider]  latanoprost (XALATAN) 0.005 % ophthalmic solution Place 1 drop into both eyes at bedtime.     Yes [provider]  loratadine (CLARITIN) 10 MG tablet Take 10 mg by mouth daily.    Yes [provider]  LORazepam (ATIVAN) 0.5 MG tablet Take 1 tablet (0.5 mg total) by mouth 2 (two) times daily as needed for anxiety. 10/02/19  Yes Gosrani, Nimish C, MD  meclizine (ANTIVERT) 12.5 MG tablet Take 12.5 mg by mouth 2 (two) times daily as needed for dizziness.    Yes [provider]  metoprolol tartrate (LOPRESSOR) 25 MG tablet TAKE 1 TABLET BY MOUTH 2 TIMES A DAY 09/21/19  Yes Herminio Commons, MD  neomycin-polymyxin-hydrocortisone (CORTISPORIN) OTIC solution INSTILL 2-3 DROPS IN [facility] EAR TWICE DAILY Patient taking differently: Place 2-3 drops into both ears 2 (two) times daily as needed (for ear pain).  09/21/19  Yes Laura Albee, MD  NP THYROID 90 MG tablet Take 1 tablet by mouth once daily Patient taking differently: Take 90 mg by mouth every morning.  09/07/19  Yes Gosrani, Nimish C, MD  ondansetron (ZOFRAN-ODT) 4 MG disintegrating tablet TAKE 1 TABLET BY MOUTH EVERY 4 TO 6 HOURS AS NEEDED Patient taking differently: Take 4 mg by mouth every 4 (four) hours as needed for nausea or vomiting.  10/09/19  Yes Gosrani, Nimish C, MD  potassium chloride SA (K-DUR,KLOR-CON) 20 MEQ tablet Take 20 mEq by mouth 2 (two) times daily.    Yes [provider]  torsemide (DEMADEX) 20 MG tablet TAKE 1 TABLET BY MOUTH TWICE DAILY Patient taking differently: Take 20  mg by mouth 2 (two) times daily.  08/31/19  Yes Herminio Commons, MD  warfarin (COUMADIN) 3 MG tablet Take 3 mg by mouth every Monday, Tuesday, Wednesday, Thursday, and Friday. Does not take on Saturday/Sunday   Yes [provider]  ondansetron (ZOFRAN-ODT) 4 MG disintegrating tablet Take 4 mg by mouth every 4 (four) hours as needed for nausea or vomiting.  03/17/18   [provider]     Objective    Physical Exam: Vitals:   11/07/19 1645 11/07/19 1713 11/07/19 1715 11/07/19 1730  BP:    (!) 119/94  Pulse: 90 94 89 100  Resp: 19 19 (!) 22 20  Temp:  TempSrc:      SpO2: 100% 97% 98% 94%    General: appears to be stated age; opens eyes to verbal stimuli; non-verbal. Skin: warm, dry; healing abrasion associated with the anterior aspect of the right lower extremity distal to right knee without evidence of associated swelling, increased warmth, or drainage. Head:  AT/[place] Eyes:  PEARL b/l, EOMI Mouth:  Oral mucosa membranes appear dry, normal dentition Neck: supple; trachea midline Heart:  RRR; did not appreciate any M/R/G Lungs: Slightly diminished bibasilar breath sounds but otherwise CTAB; did not appreciate any wheezes, rales, or rhonchi Abdomen: + BS; soft, ND, NT Vascular: 2+ pedal pulses b/l; 2+ radial pulses b/l Extremities: Trace edema in bilateral lower extremities; healing abrasion associated with right lower extremity distal to the right knee with additional findings, as described above    Labs on Admission: I have personally reviewed following labs and imaging studies  CBC: Recent Labs  Lab 11/07/19 1546  WBC 13.5*  NEUTROABS 12.0*  HGB 11.2*  HCT 35.4*  MCV 106.9*  PLT 782*   Basic Metabolic Panel: Recent Labs  Lab 11/07/19 1546  NA 138  K 2.5*  CL 90*  CO2 32  GLUCOSE 99  BUN 15  CREATININE 1.43*  CALCIUM 8.3*  MG 1.1*   GFR: CrCl cannot be calculated (Unknown ideal weight.). Liver Function Tests: Recent Labs  Lab  11/07/19 1546  AST 26  ALT 14  ALKPHOS 61  BILITOT 1.3*  PROT 6.5  ALBUMIN 3.3*   Recent Labs  Lab 11/07/19 1546  LIPASE 15   No results for input(s): AMMONIA in the last 168 hours. Coagulation Profile: Recent Labs  Lab 11/07/19 1546  INR 1.6*   Cardiac Enzymes: No results for input(s): CKTOTAL, CKMB, CKMBINDEX, TROPONINI in the last 168 hours. BNP (last 3 results) No results for input(s): PROBNP in the last 8760 hours. HbA1C: No results for input(s): HGBA1C in the last 72 hours. CBG: No results for input(s): GLUCAP in the last 168 hours. Lipid Profile: No results for input(s): CHOL, HDL, LDLCALC, TRIG, CHOLHDL, LDLDIRECT in the last 72 hours. Thyroid Function Tests: No results for input(s): TSH, T4TOTAL, FREET4, T3FREE, THYROIDAB in the last 72 hours. Anemia Panel: No results for input(s): VITAMINB12, FOLATE, FERRITIN, TIBC, IRON, RETICCTPCT in the last 72 hours. Urine analysis:    Component Value Date/Time   COLORURINE YELLOW 11/07/2019 [place] 11/07/2019 1454   LABSPEC 1.010 11/07/2019 1454   PHURINE 5.0 11/07/2019 1454   GLUCOSEU NEGATIVE 11/07/2019 1454   HGBUR SMALL (A) 11/07/2019 1454   BILIRUBINUR NEGATIVE 11/07/2019 1454   KETONESUR NEGATIVE 11/07/2019 1454   PROTEINUR NEGATIVE 11/07/2019 1454   NITRITE NEGATIVE 11/07/2019 [place] 11/07/2019 1454    Radiological Exams on Admission: DG Chest Port 1 View  Result Date: 11/07/2019 CLINICAL DATA:  Hypoxia.  COVID 19 positive EXAM: PORTABLE CHEST 1 VIEW COMPARISON:  03/20/2019 FINDINGS: Diffuse bilateral airspace disease most prominent in the left lung base. Probable bilateral pleural effusions. Very large hiatal hernia best seen on prior CT. Cardiac enlargement with vascular congestion. IMPRESSION: Diffuse bilateral airspace disease with small effusions. Left lower lobe consolidation. Findings most typical for heart failure with edema however given the COVID-19 status,  pneumonia is quite possible. Electronically Signed   By: Franchot Gallo M.D.   On: 11/07/2019 15:18     EKG: Independently reviewed, with result as described above.    Assessment/Plan    [name] is a  79 y.o. female with medical history significant for chronic hypoxic respiratory failure in the setting of chronic diastolic heart failure on 3 L continuous supplemental oxygen, paroxysmal atrial fibrillation chronically anticoagulated on warfarin, hypertension, stage III chronic kidney disease with baseline creatinine of 1.4-1.5, developmental delay, who is admitted to [facility] on 11/07/2019 with COVID-19 pneumonia after resenting from home to [facility] emergency department for evaluation of objective fever.    Principal Problem:   Pneumonia due to COVID-19 virus Active Problems:   Atrial fibrillation (HCC)   Chronic anticoagulation   Chronic kidney disease, stage 3   Hypokalemia   Hypomagnesemia   #) COVID-19 pneumonia: Diagnosis on the basis of patient's development of objective fever over the last 1 to 2 days after exposure to multiple family members with symptoms suggestive of underlying upper/lower respiratory tract infection, with rapid Covid antigen performed in the ED this evening found to be positive.  Presenting chest x-ray shows bilateral airspace/interstitial opacities consistent with Covid pneumonia versus heart failure, as above.  Presentation has not been associated with any acute hypoxia relative to patient's reported baseline supplemental oxygen requirement of continuous 3 L nasal cannula in setting of chronic diastolic heart failure.  In the absence of acute hypoxia relative to baseline supplemental oxygen requirements, criteria are not met for the patient's COVID-19 pneumonia to be considered severe in nature.  Consequently, criteria are not met for initiation of Decadron/remdesivir at this time.  Of note, the patient is reportedly a lifelong non-smoker. Will  check general inflammatory markers as well as ABG in order to monitor PaO2 to FiO2 ratio.     Plan: airborne and contact precautions with eye protection. Monitor continuous pulse oximetry. prn supplemental O2 to maintain O2 sats greater than or equal to 92%. monitor on telemetry.  Check ABG and general inflammatory markers, as above.  Check procalcitonin.  Add on serum magnesium level and check serum phosphorus level.  Repeat CBC with differential in the morning.  As needed acetaminophen for fever     #) Severe Sepsis: Appears to be on the basis of COVID-19, as above, with Sirs criteria met via presenting tachypnea and leukocytosis.  Criteria are met for patient sepsis to be considered severe in nature on the basis of elevated presenting lactic acid of 3.6.  Presentation has not been associated with any evidence of hypotension.  Consequently, criteria are not met for initiation of a 30 mL/kg IVF bolus. No evidence of additional underlying infectious process beyond COVID-19, and bacterial pneumonia is felt to be less likely.  Additionally presenting urinalysis was not suggestive of underlying UTI. As patient's sepsis is felt to be viral in nature, will refrain from initiation of antibiotics at this time.  Of note, a healing abrasion associated with the anterior aspect of the right lower extremity distal to the right knee and was noted to be associated very mild surrounding erythema in the absence of any associated swelling, discharge, or increased warmth to the touch.  Overall, I do not feel that these findings are consistent with cellulitis, but I have asked the patient's nurse to mark the current distribution of erythema.  If subsequent distribution of erythema is noted to be expansive, and this finding clinically correlates with worsening of underlying infection, then will consider initiation of abx for cellulitis at that time.   Plan: Repeat lactic acid.  Check blood cultures x2.  Repeat CBC with  differential tomorrow.  Check procalcitonin, as above.  As needed acetaminophen for fever.  Work-up and management COVID-19 pneumonia, as above.      #) Hypokalemia: Presenting potassium found to be 2.5, with implications stemming from the patient's niece's report that the patient ran out of her supplemental potassium 1 week ago without subsequent ability to refill this medication.  This is of particular note given chronic outpatient diuretic regimen consisting of torsemide 20 mg p.o. twice daily.  The patient received 40 mEq of oral potassium as well as 30 mEq of IV potassium in the ED this evening.  Of note, serum magnesium level also found to be low at 1.1.  Plan: Monitor on telemetry.  Work-up and management of hypomagnesemia, as further described below.  Repeat BMP and serum magnesium level in the morning.  I have reordered home oral potassium supplementation.       #) Hypomagnesemia: Presenting serum magnesium level found to be low 1.1.  The patient is currently receiving 2 g of IV magnesium, which was ordered while still in the ED.  Plan: Continue IV magnesium supplementation, as above.  Monitor on telemetry.  Repeat serum magnesium level in the morning.      #) Chronic diastolic heart failure: Most recent echocardiogram appears to have occurred in June 2019, with results as further described above, including finding of LVEF of 55 to 60%, no focal wall motion abnormalities, and evidence of diastolic dysfunction.  Outpatient diuretic regimen consists of torsemide 20 mg grams p.o. twice daily.  Given slight interval increase in BNP as well as presenting chest x-ray showing evidence of airspace opacities with small bilateral pleural effusions, mild acute on chronic diastolic heart failure is a possibility, although given presenting complaint of objective fever following exposure to family members with suspected upper and lower respiratory tract infection, I feel that any acute  decompensation of heart failure would likely be a secondary consequence stemming from underlying physiologic stress due to confirmed COVID-19 pneumonia, as above.  Will resume home diuretic regimen, and monitor ensuing volume status closely.  Plan: Continue home torsemide regimen and resume home potassium supplementation, as above.  Monitor strict I's and O's and daily weights.  Monitor on telemetry.  Repeat BMP and serum magnesium levels in the morning.       #) Paroxysmal atrial fibrillation: In the setting of a CHA2DS2-VASc score of 5, there is an indication for the patient to be on chronic anticoagulation for thromboembolic prophylaxis. Consistent with this, the patient is chronically anticoagulated on warfarin, with presenting INR found to be slightly subtherapeutic, with initial value noted to be 1.6.  No indication for bridging at this time, although will monitor for result of D-dimer is component of routine work-up for confirmed COVID-19 infxn, as above.  Outpatient AV nodal blocking regimen consists of Lopressor.  It appears that the patient is in rate controlled atrial fibrillation at the time of this evening's presentation.  Most recent echocardiogram occurred in June 2018, with results as described above.   Plan: Inpatient pharmacy consult placed for assistance with warfarin dosing during this hospitalization.  Continue home Lopressor.  Work-up and management of presenting hypokalemia as well as hypomagnesemia, as above.       #) Stage III chronic kidney disease: Baseline creatinine noted to be 1.4-1.5, with presenting labs reflecting creatinine to be within this range.   Plan: Monitor strict I's and O's and daily weights.  Attempt avoid nephrotoxic agents.  Repeat BMP in the morning.      #) Acquired hypothyroidism: On thyroid supplementation at home.  Plan: Check  reflex TSH, and continue home dose of thyroid supplementation for now.     DVT prophylaxis: Chronically  anticoagulated on warfarin Code Status: full code; anticipate further code status clarification via discussions with patient's POA (her daughter). Disposition Plan:  Per Rounding Team Consults called: None Admission status: Inpatient; med telemetry.    PLEASE NOTE THAT DRAGON DICTATION SOFTWARE WAS USED IN THE CONSTRUCTION OF THIS NOTE.   [facility] Pager [phone] From 3PM- 11PM.   Otherwise, please contact night-coverage  [email or web address] Password [facility]  11/07/2019, 5:55 PM

## 2019-11-07 NOTE — ED Provider Notes (Signed)
[facility] EMERGENCY DEPARTMENT Provider Note   CSN: [id number] Arrival date & time: 11/07/19  1405     History Chief Complaint  Patient presents with  . low RA sat    [name] is a 79 y.o. female with a past medical history of A. fib anticoagulated with warfarin, intellectual disability, CHF, CKD, hypertension, hyperlipidemia, chronically on 3 L of oxygen, who presents today for evaluation of hypoxia.  She initially was seen by a telemedicine visit with her doctor this morning after she had fevers measured axillary by caregiver at 100.7.  She was referred to urgent care where upon arrival she was reportedly hypoxic despite being on her home oxygen and was referred to the emergency room.  Her niece, who is at bedside reports that she is patient's caregiver.  She states that over the past 3 to 4 days patient has had congestion, cough, and fever.  She reportedly has had dry heaves however denies any abdominal pain.  No diarrhea or constipation.  Patient's niece, at bedside, reports that her mother is patient's healthcare power of attorney.  Niece reports that her son had Covid however she states that he quarantined and she had a negative test prior to being around patient.  She also reportedly has a wound on her shin that occurred from her scratching at her shin.    HPI     Past Medical History:  Diagnosis Date  . Adenomatous polyps    -resected via colonoscopy in 2011  . Anemia   . Anxiety    MR  . Atrial fibrillation (HCC)    EF of 45%  . CHF (congestive heart failure) ([place])   . Chronic kidney disease   . Degenerative joint disease    status post right total hip arthroplasty  . Dysrhythmia   . GERD (gastroesophageal reflux disease)    -Barrett's esophagus  . Glaucoma   . Glaucoma   . Gout   . Hyperlipidemia    Recent lipid profile is excellent without lipid-lowering therapy  . Hypertension   . Mental retardation   . Peripheral neuropathy 09/12/2019  .  Pneumonia   . Premature ventricular contractions   . Restless leg syndrome   . Shortness of breath dyspnea   . Wears dentures   . Wears glasses     Patient Active Problem List   Diagnosis Date Noted  . Pneumonia due to COVID-19 virus 11/07/2019  . Peripheral neuropathy 09/12/2019  . Absolute anemia 02/14/2019  . Barrett's esophagus without dysplasia 02/14/2019  . CHF, acute on chronic ([place]) 04/11/2018  . Atrial fibrillation with RVR ([place]) 04/11/2018  . Electrolyte depletion 09/18/2017  . Elevated alkaline phosphatase level 09/18/2017  . Acute renal failure superimposed on chronic kidney disease ([place])   . Elevated LFTs   . Hypomagnesemia   . Intellectual disability   . Hypokalemia 09/11/2017  . Hyponatremia 09/11/2017  . Acute renal injury ([place]) 09/11/2017  . Dehydration 09/11/2017  . Supratherapeutic INR 09/11/2017  . Hyperbilirubinemia 09/11/2017  . Elevated troponin 09/11/2017  . Vulvovaginitis due to yeast 09/11/2017  . Syncope 12/26/2015  . Chronic anticoagulation 12/20/2012  . Chronic kidney disease, stage 3 12/20/2012  . Anemia, normocytic normochromic 12/20/2012  . History of diagnostic tests 12/20/2012  . Acute on chronic diastolic CHF (congestive heart failure) ([place]) 11/25/2011  . Atrial fibrillation ([place])   . Degenerative joint disease   . GERD (gastroesophageal reflux disease)   . Gout   . Adenomatous polyps   . GLAUCOMA  06/25/2009    Past Surgical History:  Procedure Laterality Date  . BIOPSY  02/22/2019   Procedure: BIOPSY;  Surgeon: Rogene Houston, MD;  Location: [facility] END[facility];  Service: Endoscopy;;  Esophageal biopies  . CATARACT EXTRACTION  2006   left eye  . COLONOSCOPY W/ POLYPECTOMY  2011  . COLONOSCOPY WITH PROPOFOL N/A 02/22/2019   Procedure: COLONOSCOPY WITH PROPOFOL;  Surgeon: Rogene Houston, MD;  Location[facility];  Service: Endoscopy;  Laterality: N/A;  . ESOPHAGOGASTRODUODENOSCOPY N/A 01/01/2016   Procedure:  ESOPHAGOGASTRODUODENOSCOPY (EGD);  Surgeon: Rogene Houston, MD;  Location: [facility];  Service: Endoscopy;  Laterality: N/A;  200  . ESOPHAGOGASTRODUODENOSCOPY (EGD) WITH PROPOFOL N/A 02/22/2019   Procedure: ESOPHAGOGASTRODUODENOSCOPY (EGD) WITH PROPOFOL;  Surgeon: Rogene Houston, MD;  Location[facility];  Service: Endoscopy;  Laterality: N/A;  . EYE SURGERY    . JOINT REPLACEMENT     x2  . MULTIPLE TOOTH EXTRACTIONS    . RADIOLOGY WITH ANESTHESIA Right 11/21/2015   Procedure: MRI OF RIGHT KNEE WITHOUT CONTAST    (RADIOLOGY WITH ANESTHESIA);  Surgeon: Medication Radiologist, MD;  Location: [place];  Service: Radiology;  Laterality: Right;  . RADIOLOGY WITH ANESTHESIA N/A 08/11/2018   Procedure: MRI of the BRAIN WITH ANESTHESIA WITHOUT CONTRAST;  Surgeon: Radiologist, Medication, MD;  Location: [place];  Service: Radiology;  Laterality: N/A;  . TONSILLECTOMY    . TOTAL HIP ARTHROPLASTY  2006   Right     OB History   No obstetric history on file.     History reviewed. No pertinent family history.  Social History   Tobacco Use  . Smoking status: Never Smoker  . Smokeless tobacco: Never Used  Substance Use Topics  . Alcohol use: No  . Drug use: No    Home Medications Prior to Admission medications   Medication Sig Start Date End Date Taking? Authorizing Provider  acetaminophen (TYLENOL) 500 MG tablet Take 500-1,000 mg by mouth every 6 (six) hours as needed for moderate pain.    Yes [provider]  allopurinol (ZYLOPRIM) 100 MG tablet Take 100 mg by mouth at bedtime.    Yes [provider]  Brinzolamide-Brimonidine (SIMBRINZA) 1-0.2 % SUSP Place 1 drop into both eyes 2 (two) times daily.    Yes [provider]  Cholecalciferol (VITAMIN D3) 5000 units CAPS Take 5,000 Units by mouth daily.    Yes [provider]  esomeprazole (NEXIUM) 20 MG capsule Take 20 mg by mouth 2 (two) times daily before a meal.    Yes [provider]    ferrous gluconate (FERGON) 324 MG tablet Take 324 mg by mouth 3 (three) times a week.    Yes [provider]  fluticasone (FLONASE) 50 MCG/ACT nasal spray Place 1 spray into both nostrils daily.   Yes [provider]  gabapentin (NEURONTIN) 300 MG capsule Take 300 mg at bedtime by mouth. 08/23/17  Yes [provider]  latanoprost (XALATAN) 0.005 % ophthalmic solution Place 1 drop into both eyes at bedtime.     Yes [provider]  loratadine (CLARITIN) 10 MG tablet Take 10 mg by mouth daily.    Yes [provider]  LORazepam (ATIVAN) 0.5 MG tablet Take 1 tablet (0.5 mg total) by mouth 2 (two) times daily as needed for anxiety. 10/02/19  Yes Gosrani, Nimish C, MD  meclizine (ANTIVERT) 12.5 MG tablet Take 12.5 mg by mouth 2 (two) times daily as needed for dizziness.  Yes [provider]  metoprolol tartrate (LOPRESSOR) 25 MG tablet TAKE 1 TABLET BY MOUTH 2 TIMES A DAY 09/21/19  Yes Herminio Commons, MD  neomycin-polymyxin-hydrocortisone (CORTISPORIN) OTIC solution INSTILL 2-3 DROPS IN [facility] EAR TWICE DAILY Patient taking differently: Place 2-3 drops into both ears 2 (two) times daily as needed (for ear pain).  09/21/19  Yes Doree Albee, MD  NP THYROID 90 MG tablet Take 1 tablet by mouth once daily Patient taking differently: Take 90 mg by mouth every morning.  09/07/19  Yes Gosrani, Nimish C, MD  ondansetron (ZOFRAN-ODT) 4 MG disintegrating tablet TAKE 1 TABLET BY MOUTH EVERY 4 TO 6 HOURS AS NEEDED Patient taking differently: Take 4 mg by mouth every 4 (four) hours as needed for nausea or vomiting.  10/09/19  Yes Gosrani, Nimish C, MD  potassium chloride SA (K-DUR,KLOR-CON) 20 MEQ tablet Take 20 mEq by mouth 2 (two) times daily.    Yes [provider]  torsemide (DEMADEX) 20 MG tablet TAKE 1 TABLET BY MOUTH TWICE DAILY Patient taking differently: Take 20 mg by mouth 2 (two) times daily.  08/31/19  Yes Herminio Commons, MD   warfarin (COUMADIN) 3 MG tablet Take 3 mg by mouth every Monday, Tuesday, Wednesday, Thursday, and Friday. Does not take on Saturday/Sunday   Yes [provider]  ondansetron (ZOFRAN-ODT) 4 MG disintegrating tablet Take 4 mg by mouth every 4 (four) hours as needed for nausea or vomiting.  03/17/18   [provider]    Allergies    Patient has no known allergies.  Review of Systems   Review of Systems  Constitutional: Positive for fatigue and fever.  Respiratory: Positive for cough and shortness of breath.   Cardiovascular: Negative for chest pain, palpitations and leg swelling.  Gastrointestinal: Positive for nausea (Dry heaving). Negative for abdominal pain, diarrhea and vomiting.  Musculoskeletal: Negative for back pain and neck pain.  Skin: Positive for wound.  Neurological: Negative for weakness and headaches.  Psychiatric/Behavioral: Negative for behavioral problems and confusion.  All other systems reviewed and are negative.   Physical Exam Updated Vital Signs BP 137/76 (BP Location: Right Arm)   Pulse (!) 105   Temp 99.6 F (37.6 C) (Oral)   Resp (!) 28   Wt 86.4 kg   SpO2 100%   BMI 28.54 kg/m   Physical Exam Vitals and nursing note reviewed.  HENT:     Head: Normocephalic and atraumatic.  Cardiovascular:     Rate and Rhythm: Normal rate.     Pulses: Normal pulses.     Heart sounds: Normal heart sounds.  Pulmonary:     Effort: Tachypnea present.     Breath sounds: Normal air entry. Examination of the right-lower field reveals rhonchi and rales. Examination of the left-lower field reveals rhonchi and rales. Rhonchi and rales present. No decreased breath sounds.  Abdominal:     Tenderness: There is no abdominal tenderness.  Musculoskeletal:     Cervical back: Normal range of motion.     Right lower leg: Edema present.     Left lower leg: Edema present.     Comments: +2 pitting edema bilaterally.   Skin:    General: Skin is warm and dry.      Comments: There is a 10cmx3cm wound on anterior shin with out evidence of secondary infection.    Neurological:     Mental Status: She is alert. Mental status is at baseline.     Comments: Awake, baseline  per niece. Requests gingerale.   Psychiatric:        Mood and Affect: Mood normal.        Behavior: Behavior normal.     ED Results / Procedures / Treatments   Labs (all labs ordered are listed, but only abnormal results are displayed) Labs Reviewed  COMPREHENSIVE METABOLIC PANEL - Abnormal; Notable for the following components:      Result Value   Potassium 2.5 (*)    Chloride 90 (*)    Creatinine, Ser 1.43 (*)    Calcium 8.3 (*)    Albumin 3.3 (*)    Total Bilirubin 1.3 (*)    GFR calc non Af Amer 35 (*)    GFR calc Af Amer 41 (*)    Anion gap 16 (*)    All other components within normal limits  CBC WITH DIFFERENTIAL/PLATELET - Abnormal; Notable for the following components:   WBC 13.5 (*)    RBC 3.31 (*)    Hemoglobin 11.2 (*)    HCT 35.4 (*)    MCV 106.9 (*)    RDW 16.1 (*)    Platelets 116 (*)    Neutro Abs 12.0 (*)    Lymphs Abs 0.6 (*)    Abs Immature Granulocytes 0.08 (*)    All other components within normal limits  PROTIME-INR - Abnormal; Notable for the following components:   Prothrombin Time 18.7 (*)    INR 1.6 (*)    All other components within normal limits  BRAIN NATRIURETIC PEPTIDE - Abnormal; Notable for the following components:   B Natriuretic Peptide 435.0 (*)    All other components within normal limits  LACTIC ACID, PLASMA - Abnormal; Notable for the following components:   Lactic Acid, Venous 3.6 (*)    All other components within normal limits  URINALYSIS, ROUTINE W REFLEX MICROSCOPIC - Abnormal; Notable for the following components:   Hgb urine dipstick SMALL (*)    All other components within normal limits  MAGNESIUM - Abnormal; Notable for the following components:   Magnesium 1.1 (*)    All other components within normal limits  POC  SARS CORONAVIRUS 2 AG -  ED - Abnormal; Notable for the following components:   SARS Coronavirus 2 Ag POSITIVE (*)    All other components within normal limits  URINE CULTURE  LIPASE, BLOOD    EKG None  Radiology DG Chest Port 1 View  Result Date: 11/07/2019 CLINICAL DATA:  Hypoxia.  COVID 19 positive EXAM: PORTABLE CHEST 1 VIEW COMPARISON:  03/20/2019 FINDINGS: Diffuse bilateral airspace disease most prominent in the left lung base. Probable bilateral pleural effusions. Very large hiatal hernia best seen on prior CT. Cardiac enlargement with vascular congestion. IMPRESSION: Diffuse bilateral airspace disease with small effusions. Left lower lobe consolidation. Findings most typical for heart failure with edema however given the COVID-19 status, pneumonia is quite possible. Electronically Signed   By: Franchot Gallo M.D.   On: 11/07/2019 15:18    Procedures .Critical Care Performed by: Lorin Glass, PA-C Authorized by: Lorin Glass, PA-C   Critical care provider statement:    Critical care time (minutes):  45   Critical care was necessary to treat or prevent imminent or life-threatening deterioration of the following conditions:  Metabolic crisis and endocrine crisis   Critical care was time spent personally by me on the following activities:  Discussions with consultants, evaluation of patient's response to treatment, examination of patient, ordering and performing treatments and interventions, ordering  and review of laboratory studies, ordering and review of radiographic studies, pulse oximetry, re-evaluation of patient's condition, obtaining history from patient or surrogate and review of old charts   (including critical care time)  Medications Ordered in ED Medications  potassium chloride 10 mEq in 100 mL IVPB (10 mEq Intravenous New Bag/Given 11/07/19 1940)  potassium chloride SA (KLOR-CON) CR tablet 40 mEq (40 mEq Oral Given 11/07/19 1654)  0.9 %  sodium chloride  infusion ( Intravenous Bolus from Bag 11/07/19 1741)  sodium chloride 0.9 % bolus 250 mL ( Intravenous Stopped 11/07/19 1746)  magnesium sulfate IVPB 2 g 50 mL (0 g Intravenous Stopped 11/07/19 1927)    ED Course  I have reviewed the triage vital signs and the nursing notes.  Pertinent labs & imaging results that were available during my care of the patient were reviewed by me and considered in my medical decision making (see chart for details).  Clinical Course as of Nov 07 2131  Tue Nov 07, 2019  1504 POC SARS Coronavirus 2 Ag-ED - Nasal Swab (BD Veritor Kit)(!) [EH]  1638 Lactic Acid, Venous(!!): 3.6 [EH]  1640 Mag level ordered, K ordered.   Potassium(!!): 2.5 [EH]    Clinical Course User Index [EH] Ollen Gross   MDM Rules/Calculators/A&P                     Patient presents today for evaluation of fever, and congestion.  She was reportedly hypoxic at urgent care. Here she is not significantly hypoxic on her baseline 3 L of oxygen running in the 90%'s Covid test was obtained and was positive.  Labs are significant for leukocytosis of 13.5, mild anemia of 11.2.  CMP is significant for a potassium of 2.5, patient was given both oral and multiple rounds of IV replacement.  Her magnesium was also low at 1.1, replacement was ordered.  While IV diuresis in the emergency room was considered, based on patient's significant hypokalemia, and multiple comorbidities in the setting of active Covid infection do not feel that it is safe to give the patient Lasix and discharge.  Lactic acid was elevated at 3.6.  I suspect the patients vitals are from her coronavirus rather than a bacterial infectious source, complicated by poor p.o. intake.  BNP was elevated at 435.  Chest x-ray showed concern for pulmonary edema in addition to possible Covid.  She was not given a 30/kg bolus as her lactic was not elevated above 4 and she was not hypotensive while in my care.  Based on the pulmonary  edema, along with elevated BNP she is given a small bolus of 250 mL and started on gentle IV rehydration.  UA without evidence of infection.  This patient was seen as a shared visit with Dr. Roderic Palau.  I spoke with hospitalist who agreed to see the patient for admission.   Note: Portions of this report may have been transcribed using voice recognition software. Every effort was made to ensure accuracy; however, inadvertent computerized transcription errors may be present  Final Clinical Impression(s) / ED Diagnoses Final diagnoses:  Pneumonia due to COVID-19 virus  Acute on chronic congestive heart failure, unspecified heart failure type ([place])  Dehydration  Hypokalemia  Hypomagnesemia  Lactic acidosis    Rx / DC Orders ED Discharge Orders    None       Ollen Gross 11/07/19 2138    Milton Ferguson, MD 11/08/19 1023

## 2019-11-07 NOTE — ED Triage Notes (Signed)
Pt seen at urgent care for congestion and fever.  Found pt with low sats on RA from 80's to 90's.  Pt place on O2 at urgent care and sats increased to 95 % on 3 L/M per report.

## 2019-11-07 NOTE — Progress Notes (Signed)
Metrics: Intervention Frequency ACO  Documented Smoking Status Yearly  Screened one or more times in 24 months  Cessation Counseling or  Active cessation medication Past 24 months  Past 24 months   Guideline developer: UpToDate (See UpToDate for funding source) Date Released: 2014       Wellness Office Visit  Subjective:  Patient ID: [name], female    DOB: 1941/03/13  Age: 79 y.o. MRN: [id number]  CC: This is an audio telemedicine visit with the permission of the patient and her power of attorney, who is her niece.  I was easily able to recognize the niece's voice on the phone. This is an acute visit with symptoms of nasal congestion, sinus congestion and fever. HPI  Apparently the patient has had the symptoms for the last few days.  She was in contact with a family member with Covid 19 disease which was 3 weeks ago.  She has been feeding sicker lately.  She has had dry heaves but no significant abdominal pain.  There is no diarrhea. Past Medical History:  Diagnosis Date  . Adenomatous polyps    -resected via colonoscopy in 2011  . Anemia   . Anxiety    MR  . Atrial fibrillation (HCC)    EF of 45%  . CHF (congestive heart failure) ([place])   . Chronic kidney disease   . Degenerative joint disease    status post right total hip arthroplasty  . Dysrhythmia   . GERD (gastroesophageal reflux disease)    -Barrett's esophagus  . Glaucoma   . Glaucoma   . Gout   . Hyperlipidemia    Recent lipid profile is excellent without lipid-lowering therapy  . Hypertension   . Mental retardation   . Peripheral neuropathy 09/12/2019  . Pneumonia   . Premature ventricular contractions   . Restless leg syndrome   . Shortness of breath dyspnea   . Wears dentures   . Wears glasses       History reviewed. No pertinent family history.  Social History   Social History Narrative   Single,lives by herself.A family member (niece) now with her 8am-2pm,other family members rest of day.     Social History   Tobacco Use  . Smoking status: Never Smoker  . Smokeless tobacco: Never Used  Substance Use Topics  . Alcohol use: No    Current Meds  Medication Sig  . acetaminophen (TYLENOL) 500 MG tablet Take 500-1,000 mg by mouth every 6 (six) hours as needed for moderate pain.   Marland Kitchen allopurinol (ZYLOPRIM) 100 MG tablet Take 100 mg by mouth at bedtime.   . Brinzolamide-Brimonidine (SIMBRINZA) 1-0.2 % SUSP Place 1 drop into both eyes 2 (two) times daily.   . Cholecalciferol (VITAMIN D3) 5000 units CAPS Take 5,000 Units by mouth daily.   Marland Kitchen esomeprazole (NEXIUM) 20 MG capsule Take 20 mg by mouth 2 (two) times daily before a meal.   . ferrous gluconate (FERGON) 324 MG tablet Take 324 mg by mouth daily.   . fluticasone (FLONASE) 50 MCG/ACT nasal spray Place 1 spray into both nostrils daily.  Marland Kitchen gabapentin (NEURONTIN) 300 MG capsule Take 300 mg at bedtime by mouth.  . latanoprost (XALATAN) 0.005 % ophthalmic solution Place 1 drop into both eyes at bedtime.    Marland Kitchen loratadine (CLARITIN) 10 MG tablet Take 10 mg by mouth daily.   Marland Kitchen LORazepam (ATIVAN) 0.5 MG tablet Take 1 tablet (0.5 mg total) by mouth 2 (two) times daily as needed for anxiety.  Marland Kitchen  meclizine (ANTIVERT) 12.5 MG tablet Take 12.5 mg by mouth 2 (two) times daily as needed for dizziness.   . metoprolol tartrate (LOPRESSOR) 25 MG tablet TAKE 1 TABLET BY MOUTH 2 TIMES A DAY  . neomycin-polymyxin-hydrocortisone (CORTISPORIN) 3.5-10000-1 otic suspension Place 2-3 drops into both ears 2 (two) times daily as needed (ear pain).   Marland Kitchen neomycin-polymyxin-hydrocortisone (CORTISPORIN) OTIC solution INSTILL 2-3 DROPS IN EACH EAR TWICE DAILY  . NP THYROID 90 MG tablet Take 1 tablet by mouth once daily  . ondansetron (ZOFRAN-ODT) 4 MG disintegrating tablet TAKE 1 TABLET BY MOUTH EVERY 4 TO 6 HOURS AS NEEDED  . potassium chloride SA (K-DUR,KLOR-CON) 20 MEQ tablet Take 20 mEq by mouth 2 (two) times daily.   Marland Kitchen torsemide (DEMADEX) 20 MG tablet TAKE 1  TABLET BY MOUTH TWICE DAILY  . warfarin (COUMADIN) 3 MG tablet Take 3 mg by mouth every Monday, Tuesday, Wednesday, Thursday, and Friday. Take 1 tablet by mouth Monday through Thursday. Take 1/2 tablet by mouth on Friday.  . [DISCONTINUED] amoxicillin-clavulanate (AUGMENTIN) 875-125 MG tablet Take 1 tablet by mouth 2 (two) times daily.      Objective:   Today's Vitals: There were no vitals taken for this visit. Vitals with BMI 09/12/2019 08/04/2019 07/06/2019  Height (No Data) 5' 8.5" 5\' 8"   Weight (No Data) 181 lbs 175 lbs  BMI - 10.93 23.55  Systolic 732 202 542  Diastolic 71 69 80  Pulse 80 80 80     Physical Exam   Patient did not come to the phone but history was relayed by the niece.    Assessment   1. Fever, unspecified fever cause       Tests ordered No orders of the defined types were placed in this encounter.    Plan: 1. In view of the fever and respiratory symptoms, I have told the patient to be taken to the urgent care to be examined and probably will need a COVID-19 test. 2. This phone call lasted approximately 5 minutes.   No orders of the defined types were placed in this encounter.   Doree Albee, MD

## 2019-11-07 NOTE — ED Notes (Signed)
CRITICAL VALUE ALERT  Critical Value:  Lactic Acid 3.6  Date & Time Notied:  11/07/19 1637  Provider Notified: Conard Novak, PA  Orders Received/Actions taken: na

## 2019-11-07 NOTE — Progress Notes (Signed)
Brief note regarding plan, with full H&P to follow:  79 year old female with history of chronic hypoxic respiratory failure on 3 L continuous supplemental oxygen, chronic diastolic heart failure, paroxysmal atrial fibrillation chronically anticoagulated on warfarin, stage III chronic kidney disease with baseline creatinine 1.4-1.5, developmental delay, who is admitted to [facility] with COVID-19 pneumonia at presenting from home to the emergency department for evaluation of objective fever.  No worsening of supplemental oxygen requirements relative to baseline demand.  Presentation also associated with multiple electrolyte abnormalities, including hypokalemia and hypomagnesemia, with patient reportedly unable to refill her home daily potassium supplementation in the setting of chronic torsemide diuresis.  Potential mild acute on chronic heart failure as a secondary consequence of primary Covid, with presenting complaint of objective fever.  Will check general inflammatory markers, ABG, procalcitonin.  We will also repeat EKG given the appearance of artifact with presenting EKG. of note, the patient lives at home with her niece, although her daughter is her power of attorney.      Babs Bertin, DO Hospitalist

## 2019-11-07 NOTE — Progress Notes (Signed)
ANTICOAGULATION CONSULT NOTE - Initial Consult  Pharmacy Consult for: warfarin dosing Indication: atrial fibrillation  No Known Allergies  Patient Measurements: Weight: 190 lb 7.6 oz (86.4 kg) Heparin Dosing Weight:      Vital Signs: Temp: 99.6 F (37.6 C) (01/05 2124) Temp Source: Oral (01/05 2124) BP: 137/76 (01/05 2124) Pulse Rate: 105 (01/05 2124)  Labs: Recent Labs    11/07/19 1546  HGB 11.2*  HCT 35.4*  PLT 116*  LABPROT 18.7*  INR 1.6*  CREATININE 1.43*    Estimated Creatinine Clearance: 38.3 mL/min (A) (by C-G formula based on SCr of 1.43 mg/dL (H)).   Medical History: Past Medical History:  Diagnosis Date  . Adenomatous polyps    -resected via colonoscopy in 2011  . Anemia   . Anxiety    MR  . Atrial fibrillation (HCC)    EF of 45%  . CHF (congestive heart failure) ([place])   . Chronic kidney disease   . Degenerative joint disease    status post right total hip arthroplasty  . Dysrhythmia   . GERD (gastroesophageal reflux disease)    -Barrett's esophagus  . Glaucoma   . Glaucoma   . Gout   . Hyperlipidemia    Recent lipid profile is excellent without lipid-lowering therapy  . Hypertension   . Mental retardation   . Peripheral neuropathy 09/12/2019  . Pneumonia   . Premature ventricular contractions   . Restless leg syndrome   . Shortness of breath dyspnea   . Wears dentures   . Wears glasses     Assessment: Pharmacy consulted to dose warfarin for this 79 yo female with atrial fibrillation on chronic anticoagulation with warfarin. Bridging with IV anticoagulant has not been ordered at this time.  Home dose: 3mg  on Mon-Tues-Wed-Thurs-Fri only; nothing on Sat-Sun INR today: 1.6 Last dose: 3mg  on 11-07-19 Significant drug interactions: not at this time CBC: H/H: 11.2/35.4     platelets 116K  Goal of Therapy:  INR 2-3 Monitor platelets by anticoagulation protocol: Yes   Plan:  No warfarin tonight, as patient  already took warfarin 3mg   earlier today. Daily INR and CBC Monitor for signs/symptoms of bleeding.   Despina Pole 11/07/2019,9:53 PM

## 2019-11-08 ENCOUNTER — Other Ambulatory Visit: Payer: Self-pay

## 2019-11-08 ENCOUNTER — Telehealth (INDEPENDENT_AMBULATORY_CARE_PROVIDER_SITE_OTHER): Payer: Self-pay

## 2019-11-08 DIAGNOSIS — I5032 Chronic diastolic (congestive) heart failure: Secondary | ICD-10-CM

## 2019-11-08 DIAGNOSIS — I480 Paroxysmal atrial fibrillation: Secondary | ICD-10-CM

## 2019-11-08 DIAGNOSIS — N1832 Chronic kidney disease, stage 3b: Secondary | ICD-10-CM

## 2019-11-08 DIAGNOSIS — E860 Dehydration: Secondary | ICD-10-CM

## 2019-11-08 LAB — C-REACTIVE PROTEIN: CRP: 23.8 mg/dL — ABNORMAL HIGH (ref <1.0)

## 2019-11-08 LAB — COMPREHENSIVE METABOLIC PANEL
ALT: 12 U/L (ref 0–44)
AST: 24 U/L (ref 15–41)
Albumin: 2.8 g/dL — ABNORMAL LOW (ref 3.5–5.0)
Alkaline Phosphatase: 53 U/L (ref 38–126)
Anion gap: 15 (ref 5–15)
BUN: 16 mg/dL (ref 8–23)
CO2: 33 mmol/L — ABNORMAL HIGH (ref 22–32)
Calcium: 8.4 mg/dL — ABNORMAL LOW (ref 8.9–10.3)
Chloride: 91 mmol/L — ABNORMAL LOW (ref 98–111)
Creatinine, Ser: 1.23 mg/dL — ABNORMAL HIGH (ref 0.44–1.00)
GFR calc Af Amer: 49 mL/min — ABNORMAL LOW (ref >60)
GFR calc non Af Amer: 42 mL/min — ABNORMAL LOW (ref >60)
Glucose, Bld: 135 mg/dL — ABNORMAL HIGH (ref 70–99)
Potassium: 2.3 mmol/L — CL (ref 3.5–5.1)
Sodium: 139 mmol/L (ref 135–145)
Total Bilirubin: 1.3 mg/dL — ABNORMAL HIGH (ref 0.3–1.2)
Total Protein: 5.8 g/dL — ABNORMAL LOW (ref 6.5–8.1)

## 2019-11-08 LAB — CBC WITH DIFFERENTIAL/PLATELET
Abs Immature Granulocytes: 0.21 10*3/uL — ABNORMAL HIGH (ref 0.00–0.07)
Basophils Absolute: 0 10*3/uL (ref 0.0–0.1)
Basophils Relative: 0 %
Eosinophils Absolute: 0 10*3/uL (ref 0.0–0.5)
Eosinophils Relative: 0 %
HCT: 30.9 % — ABNORMAL LOW (ref 36.0–46.0)
Hemoglobin: 10 g/dL — ABNORMAL LOW (ref 12.0–15.0)
Immature Granulocytes: 2 %
Lymphocytes Relative: 4 %
Lymphs Abs: 0.6 10*3/uL — ABNORMAL LOW (ref 0.7–4.0)
MCH: 33.9 pg (ref 26.0–34.0)
MCHC: 32.4 g/dL (ref 30.0–36.0)
MCV: 104.7 fL — ABNORMAL HIGH (ref 80.0–100.0)
Monocytes Absolute: 0.7 10*3/uL (ref 0.1–1.0)
Monocytes Relative: 5 %
Neutro Abs: 11.9 10*3/uL — ABNORMAL HIGH (ref 1.7–7.7)
Neutrophils Relative %: 89 %
Platelets: 104 10*3/uL — ABNORMAL LOW (ref 150–400)
RBC: 2.95 MIL/uL — ABNORMAL LOW (ref 3.87–5.11)
RDW: 16 % — ABNORMAL HIGH (ref 11.5–15.5)
WBC: 13.4 10*3/uL — ABNORMAL HIGH (ref 4.0–10.5)
nRBC: 0 % (ref 0.0–0.2)

## 2019-11-08 LAB — BLOOD GAS, ARTERIAL
Acid-Base Excess: 12.1 mmol/L — ABNORMAL HIGH (ref 0.0–2.0)
Bicarbonate: 35.1 mmol/L — ABNORMAL HIGH (ref 20.0–28.0)
FIO2: 30
O2 Saturation: 90 %
Patient temperature: 37.6
pCO2 arterial: 51.3 mmHg — ABNORMAL HIGH (ref 32.0–48.0)
pH, Arterial: 7.467 — ABNORMAL HIGH (ref 7.350–7.450)
pO2, Arterial: 56.9 mmHg — ABNORMAL LOW (ref 83.0–108.0)

## 2019-11-08 LAB — TSH: TSH: 0.016 u[IU]/mL — ABNORMAL LOW (ref 0.350–4.500)

## 2019-11-08 LAB — URINE CULTURE: Culture: NO GROWTH

## 2019-11-08 LAB — FERRITIN: Ferritin: 62 ng/mL (ref 11–307)

## 2019-11-08 LAB — FIBRINOGEN: Fibrinogen: 448 mg/dL (ref 210–475)

## 2019-11-08 LAB — D-DIMER, QUANTITATIVE: D-Dimer, Quant: 0.38 ug/mL-FEU (ref 0.00–0.50)

## 2019-11-08 LAB — PROTIME-INR
INR: 1.9 — ABNORMAL HIGH (ref 0.8–1.2)
Prothrombin Time: 21.3 seconds — ABNORMAL HIGH (ref 11.4–15.2)

## 2019-11-08 LAB — ABO/RH: ABO/RH(D): O POS

## 2019-11-08 LAB — MAGNESIUM: Magnesium: 1.7 mg/dL (ref 1.7–2.4)

## 2019-11-08 MED ORDER — SODIUM CHLORIDE 0.9 % IV SOLN
200.0000 mg | Freq: Once | INTRAVENOUS | Status: AC
  Administered 2019-11-08: 200 mg via INTRAVENOUS
  Filled 2019-11-08: qty 40

## 2019-11-08 MED ORDER — IPRATROPIUM-ALBUTEROL 20-100 MCG/ACT IN AERS
1.0000 | INHALATION_SPRAY | Freq: Four times a day (QID) | RESPIRATORY_TRACT | Status: DC
  Filled 2019-11-08: qty 4

## 2019-11-08 MED ORDER — ASCORBIC ACID 500 MG PO TABS
500.0000 mg | ORAL_TABLET | Freq: Every day | ORAL | Status: DC
  Administered 2019-11-08 – 2019-11-18 (×11): 500 mg via ORAL
  Filled 2019-11-08 (×11): qty 1

## 2019-11-08 MED ORDER — SODIUM CHLORIDE 0.9 % IV SOLN
100.0000 mg | Freq: Every day | INTRAVENOUS | Status: AC
  Administered 2019-11-09 – 2019-11-12 (×4): 100 mg via INTRAVENOUS
  Filled 2019-11-08 (×4): qty 20

## 2019-11-08 MED ORDER — WARFARIN SODIUM 2 MG PO TABS
3.0000 mg | ORAL_TABLET | Freq: Once | ORAL | Status: AC
  Administered 2019-11-08: 3 mg via ORAL
  Filled 2019-11-08: qty 1

## 2019-11-08 MED ORDER — IPRATROPIUM-ALBUTEROL 20-100 MCG/ACT IN AERS: 1.0000 | INHALATION_SPRAY | Freq: Four times a day (QID) | RESPIRATORY_TRACT | Status: DC | PRN

## 2019-11-08 MED ORDER — METHYLPREDNISOLONE SODIUM SUCC 125 MG IJ SOLR
0.5000 mg/kg | Freq: Two times a day (BID) | INTRAMUSCULAR | Status: DC
  Administered 2019-11-08 – 2019-11-15 (×16): 43.125 mg via INTRAVENOUS
  Filled 2019-11-08 (×17): qty 2

## 2019-11-08 MED ORDER — GUAIFENESIN-DM 100-10 MG/5ML PO SYRP
5.0000 mL | ORAL_SOLUTION | ORAL | Status: DC | PRN
  Administered 2019-11-09: 5 mL via ORAL
  Filled 2019-11-08 (×2): qty 5

## 2019-11-08 MED ORDER — POTASSIUM CHLORIDE 10 MEQ/100ML IV SOLN
10.0000 meq | INTRAVENOUS | Status: AC
  Administered 2019-11-08 (×2): 10 meq via INTRAVENOUS
  Filled 2019-11-08 (×3): qty 100

## 2019-11-08 MED ORDER — ONDANSETRON HCL 4 MG/2ML IJ SOLN
4.0000 mg | Freq: Four times a day (QID) | INTRAMUSCULAR | Status: DC | PRN
  Administered 2019-11-08 – 2019-11-15 (×6): 4 mg via INTRAVENOUS
  Filled 2019-11-08 (×7): qty 2

## 2019-11-08 MED ORDER — ZINC SULFATE 220 (50 ZN) MG PO CAPS
220.0000 mg | ORAL_CAPSULE | Freq: Every day | ORAL | Status: DC
  Administered 2019-11-08 – 2019-11-18 (×11): 220 mg via ORAL
  Filled 2019-11-08 (×11): qty 1

## 2019-11-08 MED ORDER — POTASSIUM CHLORIDE CRYS ER 20 MEQ PO TBCR
40.0000 meq | EXTENDED_RELEASE_TABLET | Freq: Two times a day (BID) | ORAL | Status: DC
  Administered 2019-11-08 (×2): 40 meq via ORAL
  Filled 2019-11-08 (×4): qty 2

## 2019-11-08 NOTE — Progress Notes (Signed)
CRITICAL VALUE ALERT  Critical Value:  GRAM POSITIVE COCCI AEROBIC BOTTLE BLOOD CULTURE  Date & Time Notied:  11/08/2019 1754  Provider Notified: [place]  Orders Received/Actions taken: awaiting orders

## 2019-11-08 NOTE — Progress Notes (Signed)
Pt npo and no order for IVF. Pt dry heaving and requesting something to drink. Pt educated on npo orders. MD made aware.

## 2019-11-08 NOTE — Progress Notes (Signed)
ANTICOAGULATION CONSULT NOTE   Pharmacy Consult for: warfarin dosing Indication: atrial fibrillation  No Known Allergies  Patient Measurements: Weight: 190 lb 7.6 oz (86.4 kg) Heparin Dosing Weight:      Vital Signs: Temp: 98.5 F (36.9 C) (01/06 0616) Temp Source: Oral (01/06 0616) BP: 133/96 (01/06 0616) Pulse Rate: 97 (01/06 0616)  Labs: Recent Labs    11/07/19 1546 11/08/19 0623 11/08/19 0834  HGB 11.2* 10.0*  --   HCT 35.4* 30.9*  --   PLT 116* 104*  --   LABPROT 18.7*  --  21.3*  INR 1.6*  --  1.9*  CREATININE 1.43* 1.23*  --     Estimated Creatinine Clearance: 44.5 mL/min (A) (by C-G formula based on SCr of 1.23 mg/dL (H)).   Medical History: Past Medical History:  Diagnosis Date  . Adenomatous polyps    -resected via colonoscopy in 2011  . Anemia   . Anxiety    MR  . Atrial fibrillation (HCC)    EF of 45%  . CHF (congestive heart failure) ([place])   . Chronic kidney disease   . Degenerative joint disease    status post right total hip arthroplasty  . Dysrhythmia   . GERD (gastroesophageal reflux disease)    -Barrett's esophagus  . Glaucoma   . Glaucoma   . Gout   . Hyperlipidemia    Recent lipid profile is excellent without lipid-lowering therapy  . Hypertension   . Mental retardation   . Peripheral neuropathy 09/12/2019  . Pneumonia   . Premature ventricular contractions   . Restless leg syndrome   . Shortness of breath dyspnea   . Wears dentures   . Wears glasses     Assessment: Pharmacy consulted to dose warfarin for this 79 yo female with atrial fibrillation on chronic anticoagulation with warfarin.   Home dose: 3mg  on Mon-Tues-Wed-Thurs-Fri only; nothing on Sat-Sun INR today: 1.9 Last dose: 3mg  on 11-07-19 Significant drug interactions: not at this time CBC: H/H: 11.2/35.4     platelets 116K  Goal of Therapy:  INR 2-3 Monitor platelets by anticoagulation protocol: Yes   Plan:  Warfarin 3 mg x 1 dose Daily INR and CBC Monitor  for signs/symptoms of bleeding.  Margot Ables, PharmD Clinical Pharmacist 11/08/2019 9:07 AM

## 2019-11-08 NOTE — Care Management Important Message (Signed)
Important Message  Patient Details  Name: [name] MRN: [id number] Date of Birth: 22-Jul-1941   Medicare Important Message Given:  Yes(Patricia, RN will deliver letter to patient due to contact precautions)     Tommy Medal 11/08/2019, 2:51 PM

## 2019-11-08 NOTE — Progress Notes (Signed)
Answers to admission questions provided by pt niece, Eric Form.

## 2019-11-08 NOTE — Progress Notes (Signed)
Lab called with critical lab for Potassium 2.3. MD made aware.

## 2019-11-08 NOTE — Progress Notes (Signed)
PROGRESS NOTE    [name]  DSK:[id number] DOB: [birth year] DOA: 11/07/2019 PCP: Doree Albee, MD    Brief Narrative:  79 y.o. female with medical history significant for chronic hypoxic respiratory failure in the setting of chronic diastolic heart failure on 3 L continuous supplemental oxygen, paroxysmal atrial fibrillation chronically anticoagulated on warfarin, hypertension, stage III chronic kidney disease with baseline creatinine of 1.4-1.5, developmental delay, who is admitted to [facility] on 11/07/2019 with COVID-19 pneumonia after resenting from home to [facility] emergency department for evaluation of objective fever.   In the setting of history of developmental delay associated with baseline mental status, the following history is obtained via my discussions with the emergency department physician as well as via chart review.  Per the patient's niece, with whom the patient lives, multiple family members to whom the patient has been exposed over the last week have been experiencing new onset rhinitis, rhinorrhea, sore throat, shortness of breath and subjective fever, although niece reports that none of these family members have been tested for Covid at this time.  Subsequent to the above symptoms experienced by patient's family members, the patient reportedly developed an objective fever over the last day, with temperature max at home noted to be 102-103 F.  [name] reportedly exhibited some dry heaving over that time in the absence of any diarrhea or rash.  Per niece, no increase in patient's supplemental oxygen demands relative to her baseline requirement of 3 L continuous nasal cannula.  Patient has undergone no recent traveling.  Past medical history is notable for a history of chronic diastolic heart failure.  Most recent echocardiogram was performed in June 2019 and showed left ventricle associated with normal cavity size and normal wall thickness, LVEF 55 to 60%, no  focal wall motion abnormalities, but showed evidence of diastolic dysfunction as well as mild mitral regurgitation and moderate tricuspid regurgitation.  Outpatient diuretic regimen consists of torsemide 20 mg p.o. twice daily.  While on this diuretic, the patient is also prescribed potassium chloride 20 mEq p.o. twice daily.  However, the patient's niece reports that the patient ran out of her potassium supplementation approximately 1 week ago, and has been unable to subsequently fill this prescription.    Assessment & Plan: 1-sepsis secondary to pneumonia due to COVID-19 virus -Patient reports feeling short of breath, using 3.5 L nasal supplementation with (at home chronically on 2.5-3 L) -Chest x-ray demonstrating bilateral infiltrates -On presentation patient met sepsis criteria; with elevated respiratory rate, elevated heart rate, low-grade temperature, elevated WBCs and also elevated lactic acid. -Gentle fluid resuscitation was provided on admission; will continue to follow fluid levels and judiciously further hydrate. -Patient will be started on remdesivir and the steroids as part of coronavirus protocol treatment. -Follow inflammatory markers and clinical response. -As needed antipyretics and bronchodilators.  2-paroxysmal atrial fibrillation (HCC) -Chronic -CHADsVASC score 5 -Continue Lopressor for rate control and use of warfarin for secondary prevention; pharmacy to dose.  3-Chronic anticoagulation -Continue warfarin for anticoagulation as per pharmacy dosing. -Follow INR.  4-Chronic kidney disease, stage 3 -Appears stable and at baseline -Per GFR evaluation patient with a stage IIIb chronic kidney disease. -Continue to follow renal function trend.  5-chronic diastolic heart failure -Follow daily weights and low-sodium diet -Continue home torsemide -Currently euvolemic.  6-hypokalemia and hypomagnesemia -Magnesium repleted and currently within normal limits -Potassium  still low (2.3); continue repletion and supplementation -Follow electrolytes trend. -Continue telemetry monitoring.  7-chronic respiratory failure -Continue oxygen supplementation -  Follow O2 sats and clinical response. -Patient uses 2.5-3 L oxygen supplementation chronically  8-hypothyroidism -Continue Synthroid.  DVT prophylaxis: Continue warfarin Code Status: Full Code Family Communication: No family at bedside; niece updated over the phone. Disposition Plan: Remains inpatient, continue electrolytes repletion, continue telemetry monitoring, start IV remdesivir and the steroids; as needed bronchodilators.  Follow clinical response.  Consultants:   None  Procedures:   See below for x-ray reports  Antimicrobials:  Anti-infectives (From admission, onward)   Start     Dose/Rate Route Frequency Ordered Stop   11/09/19 1000  remdesivir 100 mg in sodium chloride 0.9 % 100 mL IVPB     100 mg 200 mL/hr over 30 Minutes Intravenous Daily 11/08/19 0752 11/13/19 0959   11/08/19 0800  remdesivir 200 mg in sodium chloride 0.9% 250 mL IVPB     200 mg 580 mL/hr over 30 Minutes Intravenous Once 11/08/19 0752         Subjective: Currently afebrile, denies chest pain, no abdominal pain, no nausea, no vomiting.  Reports slight shortness of breath and intermittent dry coughing spells.  Using 3.5 L nasal cannula supplementation.  Objective: Vitals:   11/07/19 2124 11/08/19 0244 11/08/19 0449 11/08/19 0616  BP: 137/76 137/76  (!) 133/96  Pulse: (!) 105 (!) 105  97  Resp: (!) 28 (!) 28  20  Temp: 99.6 F (37.6 C) 99.6 F (37.6 C)  98.5 F (36.9 C)  TempSrc: Oral Oral  Oral  SpO2: 100%   99%  Weight: 86.4 kg  86.4 kg     Intake/Output Summary (Last 24 hours) at 11/08/2019 1152 Last data filed at 11/08/2019 0958 Gross per 24 hour  Intake 556.39 ml  Output 600 ml  Net -43.61 ml   Filed Weights   11/07/19 2124 11/08/19 0449  Weight: 86.4 kg 86.4 kg    Examination: General exam:  Alert, awake, oriented x 2; hard of hearing and in no acute distress.  Currently afebrile; denies chest pain.  Expressed feeling slightly short of breath and reports intermittent dry coughing spells. Respiratory system: Decreased breath sounds at the bases; no using accessory muscles.  Positive rhonchi bilaterally.   Cardiovascular system: Irregular, tachycardic, no rubs, no gallops, no JVD.   Gastrointestinal system: Abdomen is nondistended, soft and nontender. No organomegaly or masses felt. Normal bowel sounds heard. Central nervous system: Alert and oriented. No focal neurological deficits. Extremities/skin: No cyanosis or clubbing; right lower extremity with positive abrasion, no drainage, mild surrounding erythema without acute cellulitic process.  No open wounds. Psychiatry: Mood & affect appropriate.    Data Reviewed: I have personally reviewed following labs and imaging studies  CBC: Recent Labs  Lab 11/07/19 1546 11/08/19 0623  WBC 13.5* 13.4*  NEUTROABS 12.0* 11.9*  HGB 11.2* 10.0*  HCT 35.4* 30.9*  MCV 106.9* 104.7*  PLT 116* 517*   Basic Metabolic Panel: Recent Labs  Lab 11/07/19 1546 11/08/19 0623  NA 138 139  K 2.5* 2.3*  CL 90* 91*  CO2 32 33*  GLUCOSE 99 135*  BUN 15 16  CREATININE 1.43* 1.23*  CALCIUM 8.3* 8.4*  MG 1.1* 1.7   GFR: Estimated Creatinine Clearance: 44.5 mL/min (A) (by C-G formula based on SCr of 1.23 mg/dL (H)).   Liver Function Tests: Recent Labs  Lab 11/07/19 1546 11/08/19 0623  AST 26 24  ALT 14 12  ALKPHOS 61 53  BILITOT 1.3* 1.3*  PROT 6.5 5.8*  ALBUMIN 3.3* 2.8*   Recent Labs  Lab 11/07/19  1546  LIPASE 15   Coagulation Profile: Recent Labs  Lab 11/07/19 1546 11/08/19 0834  INR 1.6* 1.9*   Thyroid Function Tests: Recent Labs    11/07/19 2210  TSH 0.016*   Anemia Panel: Recent Labs    11/08/19 0623  FERRITIN 62   Urine analysis:    Component Value Date/Time   COLORURINE YELLOW 11/07/2019 [place] 11/07/2019 1454   LABSPEC 1.010 11/07/2019 1454   PHURINE 5.0 11/07/2019 1454   GLUCOSEU NEGATIVE 11/07/2019 1454   HGBUR SMALL (A) 11/07/2019 1454   BILIRUBINUR NEGATIVE 11/07/2019 1454   KETONESUR NEGATIVE 11/07/2019 1454   PROTEINUR NEGATIVE 11/07/2019 1454   NITRITE NEGATIVE 11/07/2019 1454   LEUKOCYTESUR NEGATIVE 11/07/2019 1454    Recent Results (from the past 240 hour(s))  Culture, blood (routine x 2)     Status: None (Preliminary result)   Collection Time: 11/07/19  3:46 PM   Specimen: BLOOD RIGHT HAND  Result Value Ref Range Status   Specimen Description BLOOD RIGHT HAND  Final   Special Requests   Final    BOTTLES DRAWN AEROBIC ONLY Blood Culture adequate volume   Culture   Final    NO GROWTH < 12 HOURS Performed at [facility], [address] [place], [place] [address]    Report Status PENDING  Incomplete  Culture, blood (routine x 2)     Status: None (Preliminary result)   Collection Time: 11/07/19 10:10 PM   Specimen: BLOOD RIGHT HAND  Result Value Ref Range Status   Specimen Description BLOOD RIGHT HAND  Final   Special Requests   Final    BOTTLES DRAWN AEROBIC AND ANAEROBIC Blood Culture adequate volume   Culture   Final    NO GROWTH < 12 HOURS Performed at [facility], [address] [place], [place] [address]    Report Status PENDING  Incomplete     Radiology Studies: DG Chest Port 1 View  Result Date: 11/07/2019 CLINICAL DATA:  Hypoxia.  COVID 19 positive EXAM: PORTABLE CHEST 1 VIEW COMPARISON:  03/20/2019 FINDINGS: Diffuse bilateral airspace disease most prominent in the left lung base. Probable bilateral pleural effusions. Very large hiatal hernia best seen on prior CT. Cardiac enlargement with vascular congestion. IMPRESSION: Diffuse bilateral airspace disease with small effusions. Left lower lobe consolidation. Findings most typical for heart failure with edema however given the COVID-19 status, pneumonia is quite possible.  Electronically Signed   By: Franchot Gallo M.D.   On: 11/07/2019 15:18    Scheduled Meds: . vitamin C  500 mg Oral Daily  . Ipratropium-Albuterol  1 puff Inhalation Q6H  . latanoprost  1 drop Both Eyes QHS  . methylPREDNISolone (SOLU-MEDROL) injection  0.5 mg/kg Intravenous Q12H  . metoprolol tartrate  25 mg Oral BID  . potassium chloride SA  40 mEq Oral BID  . sodium chloride flush  3 mL Intravenous Q12H  . torsemide  20 mg Oral BID  . warfarin  3 mg Oral Once  . Warfarin - Pharmacist Dosing Inpatient   Does not apply q1800  . zinc sulfate  220 mg Oral Daily   Continuous Infusions: . potassium chloride 10 mEq (11/08/19 1059)  . remdesivir 200 mg in sodium chloride 0.9% 250 mL IVPB     Followed by  . [START ON 11/09/2019] remdesivir 100 mg in NS 100 mL       LOS: 1 day    Time spent: 35 minutes. Greater than 50% of this time  was spent in direct contact with the patient, coordinating care and discussing relevant ongoing clinical issues, including discussion about acute sepsis secondary to COVID-19 pneumonia; no chest pain, good oxygen saturation at rest on 3.5 L nasal cannula supplementation.  Patient also made aware of acute electrolytes impairment and treatment provided for repletion.  Family updated over the phone.  All questions answered.  Patient remains in the hospital for IV steroids, IV remdesivir and further electrolyte repletion/monitoring.     Barton Dubois, MD [facility] Pager [phone]   11/08/2019, 11:52 AM

## 2019-11-09 ENCOUNTER — Inpatient Hospital Stay (HOSPITAL_COMMUNITY): Payer: Medicare Other

## 2019-11-09 DIAGNOSIS — R7881 Bacteremia: Secondary | ICD-10-CM | POA: Diagnosis present

## 2019-11-09 DIAGNOSIS — B9561 Methicillin susceptible Staphylococcus aureus infection as the cause of diseases classified elsewhere: Secondary | ICD-10-CM | POA: Diagnosis present

## 2019-11-09 LAB — CBC WITH DIFFERENTIAL/PLATELET
Abs Immature Granulocytes: 0.07 10*3/uL (ref 0.00–0.07)
Basophils Absolute: 0 10*3/uL (ref 0.0–0.1)
Basophils Relative: 0 %
Eosinophils Absolute: 0 10*3/uL (ref 0.0–0.5)
Eosinophils Relative: 0 %
HCT: 31.8 % — ABNORMAL LOW (ref 36.0–46.0)
Hemoglobin: 10.4 g/dL — ABNORMAL LOW (ref 12.0–15.0)
Immature Granulocytes: 1 %
Lymphocytes Relative: 3 %
Lymphs Abs: 0.4 10*3/uL — ABNORMAL LOW (ref 0.7–4.0)
MCH: 33.5 pg (ref 26.0–34.0)
MCHC: 32.7 g/dL (ref 30.0–36.0)
MCV: 102.6 fL — ABNORMAL HIGH (ref 80.0–100.0)
Monocytes Absolute: 0.5 10*3/uL (ref 0.1–1.0)
Monocytes Relative: 4 %
Neutro Abs: 10.9 10*3/uL — ABNORMAL HIGH (ref 1.7–7.7)
Neutrophils Relative %: 92 %
Platelets: 107 10*3/uL — ABNORMAL LOW (ref 150–400)
RBC: 3.1 MIL/uL — ABNORMAL LOW (ref 3.87–5.11)
RDW: 15.6 % — ABNORMAL HIGH (ref 11.5–15.5)
WBC: 11.8 10*3/uL — ABNORMAL HIGH (ref 4.0–10.5)
nRBC: 0 % (ref 0.0–0.2)

## 2019-11-09 LAB — COMPREHENSIVE METABOLIC PANEL
ALT: 17 U/L (ref 0–44)
AST: 38 U/L (ref 15–41)
Albumin: 2.7 g/dL — ABNORMAL LOW (ref 3.5–5.0)
Alkaline Phosphatase: 48 U/L (ref 38–126)
Anion gap: 14 (ref 5–15)
BUN: 26 mg/dL — ABNORMAL HIGH (ref 8–23)
CO2: 31 mmol/L (ref 22–32)
Calcium: 8.3 mg/dL — ABNORMAL LOW (ref 8.9–10.3)
Chloride: 91 mmol/L — ABNORMAL LOW (ref 98–111)
Creatinine, Ser: 1.34 mg/dL — ABNORMAL HIGH (ref 0.44–1.00)
GFR calc Af Amer: 44 mL/min — ABNORMAL LOW (ref >60)
GFR calc non Af Amer: 38 mL/min — ABNORMAL LOW (ref >60)
Glucose, Bld: 127 mg/dL — ABNORMAL HIGH (ref 70–99)
Potassium: 2.5 mmol/L — CL (ref 3.5–5.1)
Sodium: 136 mmol/L (ref 135–145)
Total Bilirubin: 0.9 mg/dL (ref 0.3–1.2)
Total Protein: 5.8 g/dL — ABNORMAL LOW (ref 6.5–8.1)

## 2019-11-09 LAB — PROTIME-INR
INR: 2.1 — ABNORMAL HIGH (ref 0.8–1.2)
Prothrombin Time: 23.1 seconds — ABNORMAL HIGH (ref 11.4–15.2)

## 2019-11-09 LAB — ECHOCARDIOGRAM LIMITED: Weight: 3026.47 oz

## 2019-11-09 LAB — D-DIMER, QUANTITATIVE: D-Dimer, Quant: 0.34 ug/mL-FEU (ref 0.00–0.50)

## 2019-11-09 LAB — FERRITIN: Ferritin: 64 ng/mL (ref 11–307)

## 2019-11-09 LAB — MAGNESIUM: Magnesium: 1.5 mg/dL — ABNORMAL LOW (ref 1.7–2.4)

## 2019-11-09 LAB — C-REACTIVE PROTEIN: CRP: 27.6 mg/dL — ABNORMAL HIGH (ref <1.0)

## 2019-11-09 MED ORDER — CEFAZOLIN SODIUM-DEXTROSE 2-4 GM/100ML-% IV SOLN
2.0000 g | Freq: Three times a day (TID) | INTRAVENOUS | Status: DC
  Administered 2019-11-09 – 2019-11-16 (×21): 2 g via INTRAVENOUS
  Filled 2019-11-09 (×24): qty 100

## 2019-11-09 MED ORDER — VANCOMYCIN HCL 1250 MG/250ML IV SOLN: 1250.0000 mg | INTRAVENOUS | Status: DC

## 2019-11-09 MED ORDER — VANCOMYCIN HCL 1750 MG/350ML IV SOLN: 1750.0000 mg | Freq: Once | INTRAVENOUS | Status: DC

## 2019-11-09 MED ORDER — MAGNESIUM SULFATE 2 GM/50ML IV SOLN
2.0000 g | Freq: Once | INTRAVENOUS | Status: AC
  Administered 2019-11-09: 2 g via INTRAVENOUS
  Filled 2019-11-09: qty 50

## 2019-11-09 MED ORDER — POTASSIUM CHLORIDE CRYS ER 20 MEQ PO TBCR
40.0000 meq | EXTENDED_RELEASE_TABLET | Freq: Three times a day (TID) | ORAL | Status: DC
  Administered 2019-11-09 – 2019-11-13 (×13): 40 meq via ORAL
  Filled 2019-11-09: qty 2
  Filled 2019-11-09: qty 4
  Filled 2019-11-09 (×13): qty 2

## 2019-11-09 MED ORDER — VANCOMYCIN HCL 2000 MG/400ML IV SOLN
2000.0000 mg | Freq: Once | INTRAVENOUS | Status: AC
  Administered 2019-11-09: 2000 mg via INTRAVENOUS
  Filled 2019-11-09 (×2): qty 400

## 2019-11-09 MED ORDER — WARFARIN SODIUM 2 MG PO TABS
3.0000 mg | ORAL_TABLET | Freq: Once | ORAL | Status: AC
  Administered 2019-11-09: 3 mg via ORAL
  Filled 2019-11-09: qty 1

## 2019-11-09 NOTE — Progress Notes (Addendum)
ANTICOAGULATION CONSULT NOTE   Pharmacy Consult for: warfarin dosing Indication: atrial fibrillation  No Known Allergies  Patient Measurements: Weight: 189 lb 2.5 oz (85.8 kg) Heparin Dosing Weight:      Vital Signs: Temp: 98.5 F (36.9 C) (01/07 0638) BP: 174/90 (01/07 0211) Pulse Rate: 106 (01/07 0638)  Labs: Recent Labs    11/07/19 1546 11/08/19 0623 11/08/19 0834 11/09/19 0616  HGB 11.2* 10.0*  --  10.4*  HCT 35.4* 30.9*  --  31.8*  PLT 116* 104*  --  107*  LABPROT 18.7*  --  21.3* 23.1*  INR 1.6*  --  1.9* 2.1*  CREATININE 1.43* 1.23*  --  1.34*    Estimated Creatinine Clearance: 40.7 mL/min (A) (by C-G formula based on SCr of 1.34 mg/dL (H)).   Medical History: Past Medical History:  Diagnosis Date  . Adenomatous polyps    -resected via colonoscopy in 2011  . Anemia   . Anxiety    MR  . Atrial fibrillation (HCC)    EF of 45%  . CHF (congestive heart failure) ([place])   . Chronic kidney disease   . Degenerative joint disease    status post right total hip arthroplasty  . Dysrhythmia   . GERD (gastroesophageal reflux disease)    -Barrett's esophagus  . Glaucoma   . Glaucoma   . Gout   . Hyperlipidemia    Recent lipid profile is excellent without lipid-lowering therapy  . Hypertension   . Mental retardation   . Peripheral neuropathy 09/12/2019  . Pneumonia   . Premature ventricular contractions   . Restless leg syndrome   . Shortness of breath dyspnea   . Wears dentures   . Wears glasses     Assessment: Pharmacy consulted to dose warfarin for this 79 yo female with atrial fibrillation on chronic anticoagulation with warfarin.   Home dose: 3mg  on Mon-Tues-Wed-Thurs-Fri only; nothing on Sat-Sun INR today: 2.1 CBC: H/H: 11.2/35.4     platelets 116K  Goal of Therapy:  INR 2-3 Monitor platelets by anticoagulation protocol: Yes   Plan:  Warfarin 3 mg x 1 dose Daily INR and CBC Monitor for signs/symptoms of bleeding.  Margot Ables,  PharmD Clinical Pharmacist 11/09/2019 8:54 AM

## 2019-11-09 NOTE — Progress Notes (Signed)
PROGRESS NOTE    [name]  TIW:[id number] DOB: 22-Feb-1941 DOA: 11/07/2019 PCP: Doree Albee, MD    Brief Narrative:  79 y.o. female with medical history significant for chronic hypoxic respiratory failure in the setting of chronic diastolic heart failure on 3 L continuous supplemental oxygen, paroxysmal atrial fibrillation chronically anticoagulated on warfarin, hypertension, stage III chronic kidney disease with baseline creatinine of 1.4-1.5, developmental delay, who is admitted to [facility] on 11/07/2019 with COVID-19 pneumonia after resenting from home to [facility] emergency department for evaluation of objective fever.   In the setting of history of developmental delay associated with baseline mental status, the following history is obtained via my discussions with the emergency department physician as well as via chart review.  Per the patient's niece, with whom the patient lives, multiple family members to whom the patient has been exposed over the last week have been experiencing new onset rhinitis, rhinorrhea, sore throat, shortness of breath and subjective fever, although niece reports that none of these family members have been tested for Covid at this time.  Subsequent to the above symptoms experienced by patient's family members, the patient reportedly developed an objective fever over the last day, with temperature max at home noted to be 102-103 F.  [name] reportedly exhibited some dry heaving over that time in the absence of any diarrhea or rash.  Per niece, no increase in patient's supplemental oxygen demands relative to her baseline requirement of 3 L continuous nasal cannula.  Patient has undergone no recent traveling.  Past medical history is notable for a history of chronic diastolic heart failure.  Most recent echocardiogram was performed in June 2019 and showed left ventricle associated with normal cavity size and normal wall thickness, LVEF 55 to 60%, no  focal wall motion abnormalities, but showed evidence of diastolic dysfunction as well as mild mitral regurgitation and moderate tricuspid regurgitation.  Outpatient diuretic regimen consists of torsemide 20 mg p.o. twice daily.  While on this diuretic, the patient is also prescribed potassium chloride 20 mEq p.o. twice daily.  However, the patient's niece reports that the patient ran out of her potassium supplementation approximately 1 week ago, and has been unable to subsequently fill this prescription.    Assessment & Plan: 1-sepsis secondary to pneumonia due to COVID-19 virus and a Staph aureus bacteremia -Patient reports feeling short of breath, using 3.5 L nasal supplementation with (at home chronically on 2.5-3 L) -Chest x-ray demonstrating bilateral infiltrates -On presentation patient met sepsis criteria; with elevated respiratory rate, elevated heart rate, low-grade temperature, elevated WBCs and also elevated lactic acid. -Gentle fluid resuscitation was provided on admission; will continue to follow fluid levels and judiciously further hydrate. -Patient will be started on remdesivir and the steroids as part of coronavirus protocol treatment. -Follow inflammatory markers and clinical response. -As needed antipyretics and bronchodilators. -Started on IV cefazolin and IV vancomycin as per ID recommendations to cover for MSSA/MRSA species. -2D echo suboptimal, but no demonstrating vegetations. -Follow repeat blood cultures and recommendations by ID.  2-paroxysmal atrial fibrillation (HCC) -Chronic -CHADsVASC score 5 -Continue Lopressor for rate control and use of warfarin for secondary prevention; pharmacy to dose.  3-Chronic anticoagulation -Continue warfarin for anticoagulation as per pharmacy dosing. -Follow INR.  4-Chronic kidney disease, stage 3 -Appears stable and at baseline. -Per GFR evaluation patient with a stage IIIb chronic kidney disease. -Continue to follow renal  function trend. -Pharmacy adjusting medications for renal function.  5-acute systolic heart failure  -  Unable to assess diastolic function with atrial fibrillation at this time. -Patient with previous diastolic heart failure documented. -Cardiology service has been consulted to assist with medication adjustment -Continue to follow daily weights and low-sodium diet -Continue home torsemide -Currently euvolemic and stable.  6-hypokalemia and hypomagnesemia -Magnesium down at 1.5; will replete as well. -Potassium still low (2.5); continue repletion and supplementation -Follow electrolytes trend. -Continue telemetry monitoring.  7-chronic respiratory failure -Continue oxygen supplementation -Follow O2 sats and clinical response; wean oxygen supplementation down to baseline as tolerated. -Patient uses 2.5-3 L oxygen supplementation chronically; currently using 3.5 L.  8-hypothyroidism -Continue Synthroid.  DVT prophylaxis: Continue warfarin Code Status: Full Code Family Communication: No family at bedside; niece updated over the phone. Disposition Plan: Remains inpatient, continue electrolytes repletion (potassium is still low), also with low magnesium, continue telemetry monitoring, continue  IV remdesivir and IV steroids.  Given positive blood cultures will follow recommendations by infectious disease Dr. Vicente Males start treatment with IV cefazolin and IV vancomycin for MSSA/MRSA bacteremia.  Consultants:   ID (Dr. Megan Salon) with automatic counsultation secondary to positive for staph bacteremia  Cardiology service (secondary to acute systolic heart failure).  Procedures:   See below for x-ray reports  Antimicrobials:  Anti-infectives (From admission, onward)   Start     Dose/Rate Route Frequency Ordered Stop   11/10/19 1600  vancomycin (VANCOREADY) IVPB 1250 mg/250 mL     1,250 mg 166.7 mL/hr over 90 Minutes Intravenous Every 24 hours 11/09/19 1414     11/09/19 1200  ceFAZolin  (ANCEF) IVPB 2g/100 mL premix     2 g 200 mL/hr over 30 Minutes Intravenous Every 8 hours 11/09/19 1016     11/09/19 1000  remdesivir 100 mg in sodium chloride 0.9 % 100 mL IVPB     100 mg 200 mL/hr over 30 Minutes Intravenous Daily 11/08/19 0752 11/13/19 0959   11/09/19 1000  vancomycin (VANCOREADY) IVPB 1750 mg/350 mL  Status:  Discontinued     1,750 mg 175 mL/hr over 120 Minutes Intravenous  Once 11/09/19 0939 11/09/19 0940   11/09/19 1000  vancomycin (VANCOREADY) IVPB 2000 mg/400 mL     2,000 mg 200 mL/hr over 120 Minutes Intravenous  Once 11/09/19 0940 11/09/19 1703   11/08/19 0800  remdesivir 200 mg in sodium chloride 0.9% 250 mL IVPB     200 mg 580 mL/hr over 30 Minutes Intravenous Once 11/08/19 0752 11/08/19 1256       Subjective: No fever, no chest pain, no nausea, no vomiting.  Reports breathing is a stable.  Using 3.5 L nasal cannula supplementation.  Objective: Vitals:   11/08/19 1400 11/08/19 2051 11/09/19 0638 11/09/19 1500  BP: (!) 139/93 129/72 (!) 174/90 119/78  Pulse: 98 99 (!) 106 92  Resp: 18 (!) 24 (!) 22 20  Temp: 98.3 F (36.8 C) 98.2 F (36.8 C) 98.5 F (36.9 C) 98.6 F (37 C)  TempSrc: Oral   Oral  SpO2: 96% 96% 97% 94%  Weight:   85.8 kg     Intake/Output Summary (Last 24 hours) at 11/09/2019 1832 Last data filed at 11/09/2019 1737 Gross per 24 hour  Intake 597.95 ml  Output --  Net 597.95 ml   Filed Weights   11/07/19 2124 11/08/19 0449 11/09/19 0638  Weight: 86.4 kg 86.4 kg 85.8 kg    Examination: General exam: Alert, awake, oriented x 2; following commands appropriately and in no acute distress.  Reports breathing is a stable at this time and she is  currently afebrile.  No chest pain, no palpitations.   Respiratory system: Decreased breath sounds at the bases; positive rhonchi bilaterally.  No using accessory muscles.  Normal respiratory effort.   Cardiovascular system: Irregular irregular; no rubs, no gallops, no JVD.   Gastrointestinal  system: Abdomen is nondistended, soft and nontender. No organomegaly or masses felt. Normal bowel sounds heard. Central nervous system: Alert and oriented. No focal neurological deficits. Extremities/skin: No cyanosis or clubbing; right lower extremity with positive aberration, no appreciated drainage or tenderness.  Mild surrounding erythema without acute cellulitic process appreciated on her shin. Psychiatry: Mood & affect appropriate.    Data Reviewed: I have personally reviewed following labs and imaging studies  CBC: Recent Labs  Lab 11/07/19 1546 11/08/19 0623 11/09/19 0616  WBC 13.5* 13.4* 11.8*  NEUTROABS 12.0* 11.9* 10.9*  HGB 11.2* 10.0* 10.4*  HCT 35.4* 30.9* 31.8*  MCV 106.9* 104.7* 102.6*  PLT 116* 104* 219*   Basic Metabolic Panel: Recent Labs  Lab 11/07/19 1546 11/08/19 0623 11/09/19 0616  NA 138 139 136  K 2.5* 2.3* 2.5*  CL 90* 91* 91*  CO2 32 33* 31  GLUCOSE 99 135* 127*  BUN 15 16 26*  CREATININE 1.43* 1.23* 1.34*  CALCIUM 8.3* 8.4* 8.3*  MG 1.1* 1.7 1.5*   GFR: Estimated Creatinine Clearance: 40.7 mL/min (A) (by C-G formula based on SCr of 1.34 mg/dL (H)).   Liver Function Tests: Recent Labs  Lab 11/07/19 1546 11/08/19 0623 11/09/19 0616  AST 26 24 38  ALT _0 ALKPHOS 61 53 48  BILITOT 1.3* 1.3* 0.9  PROT 6.5 5.8* 5.8*  ALBUMIN 3.3* 2.8* 2.7*   Recent Labs  Lab 11/07/19 1546  LIPASE 15   Coagulation Profile: Recent Labs  Lab 11/07/19 1546 11/08/19 0834 11/09/19 0616  INR 1.6* 1.9* 2.1*   Thyroid Function Tests: Recent Labs    11/07/19 2210  TSH 0.016*   Anemia Panel: Recent Labs    11/08/19 0623 11/09/19 0616  FERRITIN 62 64   Urine analysis:    Component Value Date/Time   COLORURINE YELLOW 11/07/2019 [place] 11/07/2019 1454   LABSPEC 1.010 11/07/2019 1454   PHURINE 5.0 11/07/2019 1454   GLUCOSEU NEGATIVE 11/07/2019 1454   HGBUR SMALL (A) 11/07/2019 1454   BILIRUBINUR NEGATIVE  11/07/2019 1454   KETONESUR NEGATIVE 11/07/2019 1454   PROTEINUR NEGATIVE 11/07/2019 1454   NITRITE NEGATIVE 11/07/2019 1454   LEUKOCYTESUR NEGATIVE 11/07/2019 1454    Recent Results (from the past 240 hour(s))  Urine culture     Status: None   Collection Time: 11/07/19  2:54 PM   Specimen: Urine, Clean Catch  Result Value Ref Range Status   Specimen Description   Final    URINE, CLEAN CATCH Performed at [facility], [address], [place], [place] [address]    Special Requests   Final    NONE Performed at [facility], [address] [place], [place] [address]    Culture   Final    NO GROWTH Performed at [facility], [place] [address] [place], [place] [address]    Report Status 11/08/2019 FINAL  Final  Culture, blood (routine x 2)     Status: None (Preliminary result)   Collection Time: 11/07/19  3:46 PM   Specimen: BLOOD RIGHT HAND  Result Value Ref Range Status   Specimen Description BLOOD RIGHT HAND  Final   Special Requests   Final    BOTTLES DRAWN  AEROBIC ONLY Blood Culture adequate volume   Culture   Final    NO GROWTH 2 DAYS Performed at [facility], [address], [place], [place] [address]    Report Status PENDING  Incomplete  Culture, blood (routine x 2)     Status: Abnormal (Preliminary result)   Collection Time: 11/07/19 10:10 PM   Specimen: BLOOD RIGHT HAND  Result Value Ref Range Status   Specimen Description   Final    BLOOD RIGHT HAND Performed at [facility], [address], [place], [place] [address]    Special Requests   Final    BOTTLES DRAWN AEROBIC AND ANAEROBIC Blood Culture adequate volume Performed at [facility], [address] [place], [place] [address]    Culture  Setup Time   Final    GRAM POSITIVE COCCI AEROBIC BOTTLE Gram Stain Report Called to,Read Back By and Verified With: BENGTSON, P ON 11/08/19 AT 1750 BY LOY,C  PERFORMED AT [facility] Performed at [facility], [address], [place], [place] [address]    Culture  STAPHYLOCOCCUS AUREUS (A)  Final   Report Status PENDING  Incomplete  Culture, blood (routine x 2)     Status: None (Preliminary result)   Collection Time: 11/09/19 12:30 PM   Specimen: BLOOD RIGHT WRIST  Result Value Ref Range Status   Specimen Description BLOOD RIGHT WRIST  Final   Special Requests   Final    BOTTLES DRAWN AEROBIC AND ANAEROBIC Blood Culture adequate volume Performed at [facility], [address] [place], [place] [address]    Culture PENDING  Incomplete   Report Status PENDING  Incomplete  Culture, blood (routine x 2)     Status: None (Preliminary result)   Collection Time: 11/09/19 12:38 PM   Specimen: BLOOD RIGHT HAND  Result Value Ref Range Status   Specimen Description BLOOD RIGHT HAND  Final   Special Requests   Final    BOTTLES DRAWN AEROBIC AND ANAEROBIC Blood Culture adequate volume Performed at [facility], [address] [place], [place] [address]    Culture PENDING  Incomplete   Report Status PENDING  Incomplete     Radiology Studies: ECHOCARDIOGRAM LIMITED  Result Date: 11/09/2019   ECHOCARDIOGRAM LIMITED REPORT   Patient Name:   [name] [facility] Date of Exam: 11/09/2019 Medical Rec #:  [id number]     Height:       68.5 in Accession #:    [id number]    Weight:       189.2 lb Date of Birth:  [birth year]      BSA:          2.01 m Patient Age:    6 years      BP:           174/90 mmHg Patient Gender: F             HR:           106 bpm. Exam Location:  [facility]  Procedure: Limited Echo Indications:    Bacteremia  History:        Patient has prior history of Echocardiogram examinations, most                 recent 04/12/2018. Chronic Anticoagulation, COVID 19, GERD, Acute                 Rena l Injury.  Sonographer:    Leavy Cella RDCS (AE) Referring Phys: [place]  1. Left ventricular  ejection fraction, by visual estimation, is approximately 35 to 40%. The left ventricle has moderately decreased function. There is mildly increased left  ventricular wall thickness. Images are limited and not all myocardial segments visualized.  2. The left ventricle demonstrates global hypokinesis.  3. Left ventricular diastolic parameters are indeterminate in the setting of atrial fibrillation.  4. Global right ventricle has mildly reduced systolic function.The right ventricular size is normal. no increase in right ventricular wall thickness.  5. Left atrial size was moderately dilated.  6. Presence of pericardial fat pad.  7. The mitral valve is grossly normal. Trivial mitral valve regurgitation.  8. The tricuspid valve was grossly normal. Tricuspid valve regurgitation is mild.  9. Tricuspid valve regurgitation is mild. 10. TR signal is inadequate for assessing pulmonary artery systolic pressure. 11. The inferior vena cava is normal in size with <50% respiratory variability, suggesting right atrial pressure of 8 mmHg. 12. No obvious valvular vegetations. FINDINGS  Left Ventricle: Left ventricular ejection fraction, by visual estimation, is 35 to 40%. The left ventricle has moderately decreased function. The left ventricle demonstrates global hypokinesis. The left ventricular internal cavity size was the left ventricle is normal in size. There is mildly increased left ventricular wall thickness. Left ventricular diastolic parameters are indeterminate. Right Ventricle: The right ventricular size is normal. No increase in right ventricular wall thickness. Global RV systolic function is has mildly reduced systolic function. Left Atrium: Left atrial size was moderately dilated. Right Atrium: Right atrial size was normal in size. Right atrial pressure is estimated at 8 mmHg. Pericardium: There is no evidence of pericardial effusion is seen. There is no evidence of pericardial effusion. Presence of pericardial fat pad. Mitral Valve: The mitral valve is grossly normal. There is mild calcification of the mitral valve leaflet(s). MV Area by PHT, 5.54 cm. MV PHT, 39.73  msec. Trivial mitral valve regurgitation. Tricuspid Valve: The tricuspid valve is grossly normal. Tricuspid valve regurgitation is mild. Aortic Valve: The aortic valve is tricuspid. Aortic valve regurgitation is trivial. Mild aortic valve annular calcification. Pulmonic Valve: The pulmonic valve was not well visualized. Pulmonic valve regurgitation is not visualized by color flow Doppler. Pulmonic regurgitation is not visualized by color flow Doppler. Aorta: The aortic root is normal in size and structure. Venous: The inferior vena cava is normal in size with less than 50% respiratory variability, suggesting right atrial pressure of 8 mmHg. Shunts: No atrial level shunt detected by color flow Doppler.  LEFT VENTRICLE         Normals PLAX 2D LVIDd:         4.04 cm 3.6 cm   Diastology                 Normals LVIDs:         2.79 cm 1.7 cm   LV e' lateral:   9.57 cm/s 6.42 cm/s LV PW:         1.36 cm 1.4 cm   LV E/e' lateral: 8.7       15.4 LV IVS:        1.26 cm 1.3 cm   LV e' medial:    6.42 cm/s 6.96 cm/s LV SV:         42 ml   79 ml    LV E/e' medial:  12.9      6.96 LV SV Index:   20.62   45 ml/m2  LEFT ATRIUM           Index  RIGHT ATRIUM           Index LA diam:      3.80 cm 1.89 cm/m  RA Area:     19.50 cm LA Vol (A2C): 43.6 ml 21.73 ml/m RA Volume:   54.30 ml  27.06 ml/m LA Vol (A4C): 96.4 ml 48.04 ml/m   AORTA                 Normals Ao Root diam: 2.80 cm 31 mm MITRAL VALVE              Normals MV Area (PHT): 5.54 cm MV PHT:        39.73 msec 55 ms MV Decel Time: 137 msec   187 ms MV E velocity: 82.90 cm/s 103 cm/s MV A velocity: 36.40 cm/s 70.3 cm/s MV E/A ratio:  2.28       1.5  Rozann Lesches MD Electronically signed by Rozann Lesches MD Signature Date/Time: 11/09/2019/11:19:40 AMThe mitral valve is grossly normal.    Final     Scheduled Meds: . vitamin C  500 mg Oral Daily  . latanoprost  1 drop Both Eyes QHS  . methylPREDNISolone (SOLU-MEDROL) injection  0.5 mg/kg Intravenous Q12H  .  metoprolol tartrate  25 mg Oral BID  . potassium chloride SA  40 mEq Oral TID  . sodium chloride flush  3 mL Intravenous Q12H  . torsemide  20 mg Oral BID  . Warfarin - Pharmacist Dosing Inpatient   Does not apply q1800  . zinc sulfate  220 mg Oral Daily   Continuous Infusions: .  ceFAZolin (ANCEF) IV 2 g (11/09/19 1352)  . remdesivir 100 mg in NS 100 mL Stopped (11/09/19 0951)  . [START ON 11/10/2019] vancomycin       LOS: 2 days    Time spent: 35 minutes.  Greater than 50% of this time was spent in direct contact with the patient, coordinating care and discussing relevant ongoing clinical issues, including discussion about sepsis secondary to COVID-19 pneumonia; and abnormal blood cultures with 1 out of 2 positive staph areus results.  Following recommendations by infectious disease provider will give optimal coverage for MSSA and MRSA using IV vancomycin and IV cefazolin.  Blood cultures has been repeated.  2D echo demonstrating new systolic heart failure and no frank vegetation (suboptimal study).   Barton Dubois, MD [facility] Pager [phone]   11/09/2019, 6:32 PM

## 2019-11-09 NOTE — Progress Notes (Signed)
Pharmacy Antibiotic Note  [name] is a 79 y.o. female admitted on 11/07/2019 with staph aureus bacteremia.  Pharmacy has been consulted for Vancomycin and Cefazolin dosing.  Plan: Vancomycin 2000 mg IV x 1 dose. Vancomycin 1250 mg IV every 24 hours.  Goal trough 15-20 mcg/mL.  Cefazolin 2000 mg IV every 8 hours. Monitor labs, c/s, and vanco level as indicated.  Weight: 189 lb 2.5 oz (85.8 kg)  Temp (24hrs), Avg:98.4 F (36.9 C), Min:98.2 F (36.8 C), Max:98.5 F (36.9 C)  Recent Labs  Lab 11/07/19 1546 11/07/19 2210 11/08/19 0623 11/09/19 0616  WBC 13.5*  --  13.4* 11.8*  CREATININE 1.43*  --  1.23* 1.34*  LATICACIDVEN 3.6* 2.1*  --   --     Estimated Creatinine Clearance: 40.7 mL/min (A) (by C-G formula based on SCr of 1.34 mg/dL (H)).    No Known Allergies  Antimicrobials this admission: Vanco 1/7 >>  Cefazolin 1/7 >>   Dose adjustments this admission: Vanco  Microbiology results: 1/7 BCx: pending 1/5 BCx: staph aureus 1/4 1/5 UCx: ng    Thank you for allowing pharmacy to be a part of this patient's care.  Margot Ables, PharmD Clinical Pharmacist 11/09/2019 2:16 PM

## 2019-11-09 NOTE — Plan of Care (Signed)
  Problem: Education: Goal: Knowledge of General Education information will improve Description Including pain rating scale, medication(s)/side effects and non-pharmacologic comfort measures Outcome: Progressing   Problem: Health Behavior/Discharge Planning: Goal: Ability to manage health-related needs will improve Outcome: Progressing   

## 2019-11-09 NOTE — Progress Notes (Signed)
CRITICAL VALUE ALERT  Critical Value:  Potassium 2.5  Date & Time Notied:  11/08/2018  Provider Notified: yes, Dr. Dyann Kief  Orders Received/Actions taken: awaiting orders

## 2019-11-09 NOTE — Consult Note (Signed)
[facility]    Date of Admission:  11/07/2019          Reason for Consult: Automatic consultation for staph aureus bacteremia     Assessment: It appears that she has transient staph aureus bacteremia complicating a HWEXH-37 pneumonia.  I have ordered repeat blood cultures and TTE.  Since we are not currently doing rapid methicillin resistance testing when only 1 blood culture is positive I will start her on vancomycin and cefazolin after blood cultures are obtained to give optimal coverage for MSSA and MRSA.  Plan: 1. Repeat blood cultures then start IV cefazolin and vancomycin 2. TTE  Principal Problem:   Pneumonia due to COVID-19 virus Active Problems:   Staphylococcus aureus bacteremia   Atrial fibrillation (HCC)   Chronic anticoagulation   Chronic kidney disease, stage 3   Hypokalemia   Hypomagnesemia   Scheduled Meds: . vitamin C  500 mg Oral Daily  . latanoprost  1 drop Both Eyes QHS  . methylPREDNISolone (SOLU-MEDROL) injection  0.5 mg/kg Intravenous Q12H  . metoprolol tartrate  25 mg Oral BID  . potassium chloride SA  40 mEq Oral TID  . sodium chloride flush  3 mL Intravenous Q12H  . torsemide  20 mg Oral BID  . warfarin  3 mg Oral ONCE-1800  . Warfarin - Pharmacist Dosing Inpatient   Does not apply q1800  . zinc sulfate  220 mg Oral Daily   Continuous Infusions: . magnesium sulfate bolus IVPB    . remdesivir 100 mg in NS 100 mL 100 mg (11/09/19 0921)  . vancomycin     PRN Meds:.acetaminophen **OR** acetaminophen, guaiFENesin-dextromethorphan, Ipratropium-Albuterol, ondansetron (ZOFRAN) IV  HPI: [name] is a 79 y.o. female with chronic respiratory failure on home O2, diastolic heart failure, hypertension, CKD, and paroxysmal atrial fibrillation who was admitted on 11/06/2018 with fever and dry heaves.  Several close family members have had acute respiratory illness recently.  [name] had infiltrates on chest x-ray and  COVID-19 antigen was positive.  She has been started on remdesivir and dexamethasone.  1 of 2 admission blood cultures has grown staph aureus.   Review of Systems: Review of Systems  Unable to perform ROS: Other  Constitutional:       This is a remote consultation so no review of systems was obtained.    Past Medical History:  Diagnosis Date  . Adenomatous polyps    -resected via colonoscopy in 2011  . Anemia   . Anxiety    MR  . Atrial fibrillation (HCC)    EF of 45%  . CHF (congestive heart failure) ([facility])   . Chronic kidney disease   . Degenerative joint disease    status post right total hip arthroplasty  . Dysrhythmia   . GERD (gastroesophageal reflux disease)    -Barrett's esophagus  . Glaucoma   . Glaucoma   . Gout   . Hyperlipidemia    Recent lipid profile is excellent without lipid-lowering therapy  . Hypertension   . Mental retardation   . Peripheral neuropathy 09/12/2019  . Pneumonia   . Premature ventricular contractions   . Restless leg syndrome   . Shortness of breath dyspnea   . Wears dentures   . Wears glasses     Social History   Tobacco Use  . Smoking status: Never Smoker  . Smokeless tobacco: Never Used  Substance Use Topics  . Alcohol use: No  .  Drug use: No    History reviewed. No pertinent family history. No Known Allergies  OBJECTIVE: Blood pressure (!) 174/90, pulse (!) 106, temperature 98.5 F (36.9 C), resp. rate (!) 22, weight 85.8 kg, SpO2 97 %.  Physical Exam Constitutional:      Comments: This is a remote consultation so no physical examination was performed.     Lab Results Lab Results  Component Value Date   WBC 11.8 (H) 11/09/2019   HGB 10.4 (L) 11/09/2019   HCT 31.8 (L) 11/09/2019   MCV 102.6 (H) 11/09/2019   PLT 107 (L) 11/09/2019    Lab Results  Component Value Date   CREATININE 1.34 (H) 11/09/2019   BUN 26 (H) 11/09/2019   NA 136 11/09/2019   K 2.5 (LL) 11/09/2019   CL 91 (L) 11/09/2019   CO2 31  11/09/2019    Lab Results  Component Value Date   ALT 17 11/09/2019   AST 38 11/09/2019   ALKPHOS 48 11/09/2019   BILITOT 0.9 11/09/2019     Microbiology: Recent Results (from the past 240 hour(s))  Urine culture     Status: None   Collection Time: 11/07/19  2:54 PM   Specimen: Urine, Clean Catch  Result Value Ref Range Status   Specimen Description   Final    URINE, CLEAN CATCH Performed at [facility], [address], [place], [place] [address]    Special Requests   Final    NONE Performed at [facility], [address], [place], [place] [address]    Culture   Final    NO GROWTH Performed at [facility], [place] [address] [place], [place] [address]    Report Status 11/08/2019 FINAL  Final  Culture, blood (routine x 2)     Status: None (Preliminary result)   Collection Time: 11/07/19  3:46 PM   Specimen: BLOOD RIGHT HAND  Result Value Ref Range Status   Specimen Description BLOOD RIGHT HAND  Final   Special Requests   Final    BOTTLES DRAWN AEROBIC ONLY Blood Culture adequate volume   Culture   Final    NO GROWTH 2 DAYS Performed at [facility], [address], [place], [place] [address]    Report Status PENDING  Incomplete  Culture, blood (routine x 2)     Status: Abnormal (Preliminary result)   Collection Time: 11/07/19 10:10 PM   Specimen: BLOOD RIGHT HAND  Result Value Ref Range Status   Specimen Description   Final    BLOOD RIGHT HAND Performed at [facility], [address] [place], [place] [address]    Special Requests   Final    BOTTLES DRAWN AEROBIC AND ANAEROBIC Blood Culture adequate volume Performed at [facility], [address], [place], [place] [address]    Culture  Setup Time   Final    GRAM POSITIVE COCCI AEROBIC BOTTLE Gram Stain Report Called to,Read Back By and Verified With: BENGTSON, P ON 11/08/19 AT 1750 BY LOY,C  PERFORMED AT [facility] Performed at [facility], [address], [place], [place] [address]    Culture STAPHYLOCOCCUS AUREUS  (A)  Final   Report Status PENDING  Incomplete    Michel Bickers, MD [place] [facility] [phone] pager   [phone] cell 11/09/2019, 9:42 AM

## 2019-11-09 NOTE — Progress Notes (Signed)
*  PRELIMINARY RESULTS* Echocardiogram 2D Echocardiogram LIMITED has been performed.  Leavy Cella 11/09/2019, 11:04 AM

## 2019-11-10 ENCOUNTER — Encounter (HOSPITAL_COMMUNITY): Payer: Self-pay | Admitting: Internal Medicine

## 2019-11-10 DIAGNOSIS — R7881 Bacteremia: Secondary | ICD-10-CM

## 2019-11-10 DIAGNOSIS — I4819 Other persistent atrial fibrillation: Secondary | ICD-10-CM

## 2019-11-10 DIAGNOSIS — I420 Dilated cardiomyopathy: Secondary | ICD-10-CM

## 2019-11-10 LAB — COMPREHENSIVE METABOLIC PANEL
ALT: 17 U/L (ref 0–44)
AST: 32 U/L (ref 15–41)
Albumin: 2.6 g/dL — ABNORMAL LOW (ref 3.5–5.0)
Alkaline Phosphatase: 52 U/L (ref 38–126)
Anion gap: 13 (ref 5–15)
BUN: 28 mg/dL — ABNORMAL HIGH (ref 8–23)
CO2: 33 mmol/L — ABNORMAL HIGH (ref 22–32)
Calcium: 8.3 mg/dL — ABNORMAL LOW (ref 8.9–10.3)
Chloride: 89 mmol/L — ABNORMAL LOW (ref 98–111)
Creatinine, Ser: 1.49 mg/dL — ABNORMAL HIGH (ref 0.44–1.00)
GFR calc Af Amer: 39 mL/min — ABNORMAL LOW (ref >60)
GFR calc non Af Amer: 34 mL/min — ABNORMAL LOW (ref >60)
Glucose, Bld: 142 mg/dL — ABNORMAL HIGH (ref 70–99)
Potassium: 2.5 mmol/L — CL (ref 3.5–5.1)
Sodium: 135 mmol/L (ref 135–145)
Total Bilirubin: 0.6 mg/dL (ref 0.3–1.2)
Total Protein: 6 g/dL — ABNORMAL LOW (ref 6.5–8.1)

## 2019-11-10 LAB — C-REACTIVE PROTEIN: CRP: 20.5 mg/dL — ABNORMAL HIGH (ref <1.0)

## 2019-11-10 LAB — CBC WITH DIFFERENTIAL/PLATELET
Abs Immature Granulocytes: 0.04 10*3/uL (ref 0.00–0.07)
Basophils Absolute: 0 10*3/uL (ref 0.0–0.1)
Basophils Relative: 0 %
Eosinophils Absolute: 0 10*3/uL (ref 0.0–0.5)
Eosinophils Relative: 0 %
HCT: 34.7 % — ABNORMAL LOW (ref 36.0–46.0)
Hemoglobin: 11.1 g/dL — ABNORMAL LOW (ref 12.0–15.0)
Immature Granulocytes: 0 %
Lymphocytes Relative: 3 %
Lymphs Abs: 0.3 10*3/uL — ABNORMAL LOW (ref 0.7–4.0)
MCH: 33 pg (ref 26.0–34.0)
MCHC: 32 g/dL (ref 30.0–36.0)
MCV: 103.3 fL — ABNORMAL HIGH (ref 80.0–100.0)
Monocytes Absolute: 0.5 10*3/uL (ref 0.1–1.0)
Monocytes Relative: 5 %
Neutro Abs: 9.8 10*3/uL — ABNORMAL HIGH (ref 1.7–7.7)
Neutrophils Relative %: 92 %
Platelets: 121 10*3/uL — ABNORMAL LOW (ref 150–400)
RBC: 3.36 MIL/uL — ABNORMAL LOW (ref 3.87–5.11)
RDW: 15.6 % — ABNORMAL HIGH (ref 11.5–15.5)
WBC: 10.7 10*3/uL — ABNORMAL HIGH (ref 4.0–10.5)
nRBC: 0 % (ref 0.0–0.2)

## 2019-11-10 LAB — CULTURE, BLOOD (ROUTINE X 2): Special Requests: ADEQUATE

## 2019-11-10 LAB — PROTIME-INR
INR: 3.1 — ABNORMAL HIGH (ref 0.8–1.2)
Prothrombin Time: 31.9 seconds — ABNORMAL HIGH (ref 11.4–15.2)

## 2019-11-10 LAB — MAGNESIUM: Magnesium: 1.8 mg/dL (ref 1.7–2.4)

## 2019-11-10 LAB — FERRITIN: Ferritin: 65 ng/mL (ref 11–307)

## 2019-11-10 LAB — D-DIMER, QUANTITATIVE: D-Dimer, Quant: 0.39 ug/mL-FEU (ref 0.00–0.50)

## 2019-11-10 MED ORDER — METOPROLOL SUCCINATE ER 50 MG PO TB24: 50.0000 mg | ORAL_TABLET | Freq: Every day | ORAL | Status: DC

## 2019-11-10 MED ORDER — DIPHENOXYLATE-ATROPINE 2.5-0.025 MG PO TABS
1.0000 | ORAL_TABLET | Freq: Four times a day (QID) | ORAL | Status: DC | PRN
  Administered 2019-11-10 (×2): 2 via ORAL
  Filled 2019-11-10 (×2): qty 2

## 2019-11-10 MED ORDER — POTASSIUM CHLORIDE 10 MEQ/100ML IV SOLN
10.0000 meq | INTRAVENOUS | Status: AC
  Administered 2019-11-10 (×2): 10 meq via INTRAVENOUS
  Filled 2019-11-10 (×2): qty 100

## 2019-11-10 MED ORDER — SPIRONOLACTONE 25 MG PO TABS
12.5000 mg | ORAL_TABLET | Freq: Every day | ORAL | Status: DC
  Administered 2019-11-11 – 2019-11-15 (×5): 12.5 mg via ORAL
  Filled 2019-11-10 (×2): qty 1
  Filled 2019-11-10: qty 0.5
  Filled 2019-11-10: qty 1
  Filled 2019-11-10 (×4): qty 0.5
  Filled 2019-11-10: qty 1
  Filled 2019-11-10 (×2): qty 0.5

## 2019-11-10 MED ORDER — SACCHAROMYCES BOULARDII 250 MG PO CAPS
250.0000 mg | ORAL_CAPSULE | Freq: Two times a day (BID) | ORAL | Status: DC
  Administered 2019-11-10 – 2019-11-18 (×17): 250 mg via ORAL
  Filled 2019-11-10 (×18): qty 1

## 2019-11-10 MED ORDER — METOPROLOL SUCCINATE ER 50 MG PO TB24
50.0000 mg | ORAL_TABLET | Freq: Every day | ORAL | Status: AC
  Administered 2019-11-11 – 2019-11-13 (×3): 50 mg via ORAL
  Filled 2019-11-10 (×3): qty 1

## 2019-11-10 NOTE — Progress Notes (Signed)
PROGRESS NOTE    [name]  KWI:[id number] DOB: May 29, 1941 DOA: 11/07/2019 PCP: Doree Albee, MD    Brief Narrative:  79 y.o. female with medical history significant for chronic hypoxic respiratory failure in the setting of chronic diastolic heart failure on 3 L continuous supplemental oxygen, paroxysmal atrial fibrillation chronically anticoagulated on warfarin, hypertension, stage III chronic kidney disease with baseline creatinine of 1.4-1.5, developmental delay, who is admitted to [facility] on 11/07/2019 with COVID-19 pneumonia after resenting from home to [facility] emergency department for evaluation of objective fever.   In the setting of history of developmental delay associated with baseline mental status, the following history is obtained via my discussions with the emergency department physician as well as via chart review.  Per the patient's niece, with whom the patient lives, multiple family members to whom the patient has been exposed over the last week have been experiencing new onset rhinitis, rhinorrhea, sore throat, shortness of breath and subjective fever, although niece reports that none of these family members have been tested for Covid at this time.  Subsequent to the above symptoms experienced by patient's family members, the patient reportedly developed an objective fever over the last day, with temperature max at home noted to be 102-103 F.  [name] reportedly exhibited some dry heaving over that time in the absence of any diarrhea or rash.  Per niece, no increase in patient's supplemental oxygen demands relative to her baseline requirement of 3 L continuous nasal cannula.  Patient has undergone no recent traveling.  Past medical history is notable for a history of chronic diastolic heart failure.  Most recent echocardiogram was performed in June 2019 and showed left ventricle associated with normal cavity size and normal wall thickness, LVEF 55 to 60%, no  focal wall motion abnormalities, but showed evidence of diastolic dysfunction as well as mild mitral regurgitation and moderate tricuspid regurgitation.  Outpatient diuretic regimen consists of torsemide 20 mg p.o. twice daily.  While on this diuretic, the patient is also prescribed potassium chloride 20 mEq p.o. twice daily.  However, the patient's niece reports that the patient ran out of her potassium supplementation approximately 1 week ago, and has been unable to subsequently fill this prescription.    Assessment & Plan: 1-sepsis secondary to pneumonia due to COVID-19 virus and a Staph aureus (MSSA) bacteremia -Patient reports feeling short of breath, using 3.5 L nasal supplementation with (at home chronically on 2.5-3 L) -Chest x-ray demonstrating bilateral infiltrates -On presentation patient met sepsis criteria; with elevated respiratory rate, elevated heart rate, low-grade temperature, elevated WBCs and also elevated lactic acid. -Gentle fluid resuscitation was provided on admission -Patient started on remdesivir and IV steroids as part of coronavirus protocol treatment. -Follow inflammatory markers and clinical response. -As needed antipyretics and bronchodilators. -Speciation demonstrating MSSA bacteremia; following ID recommendations patient transition to IV cefazolin only.  Will follow infectious disease recommendation for length of treatment (most likely 4 weeks). -2D echo suboptimal, but no demonstrating vegetations. -Repeat blood cultures remain without growth after 24 hours.  2-paroxysmal atrial fibrillation (HCC) -Chronic -CHADsVASC score 5 -Continue B-blocker for rate control and use of warfarin for secondary prevention; pharmacy dosing.  3-Chronic anticoagulation -Continue warfarin for anticoagulation as per pharmacy dosing. -Follow INR. -No signs of overt bleeding.  4-Chronic kidney disease, stage 3 -Appears stable and at baseline. -Per GFR evaluation patient with a  stage IIIb chronic kidney disease. -Continue to follow renal function trend. -Pharmacy adjusting medications for renal function.  5-acute systolic heart failure  -Unable to assess diastolic function with atrial fibrillation at this time. -Patient with previous history of diastolic heart failure documentation and normal ejection fraction in 2017.   -Cardiology service has been consulted to assist with medication adjustment/optimization. -Continue to follow daily weights and low-sodium diet -Continue home torsemide -Currently euvolemic and stable. -Per cardiology recommendations previews BID lopressor has been transitioned to extended release Toprol and patient has been started on spironolactone. -Pending blood pressure stability might be able to start ARB prior to discharge.  6-hypokalemia and hypomagnesemia -Magnesium repleted and within normal limits currently -Potassium still low at 2.5 mEq; continue repletion and supplementation -Follow electrolytes trend. -Continue telemetry monitoring. -Following cardiology recommendations patient has been started on spironolactone.  7-chronic respiratory failure -Continue oxygen supplementation -Follow O2 sats and clinical response; wean oxygen supplementation down to baseline as tolerated. -Patient uses 2.5-3 L oxygen supplementation chronically; currently using 3 L. -Continue as needed bronchodilators.  8-hypothyroidism -Continue Synthroid.  DVT prophylaxis: Continue warfarin Code Status: Full Code Family Communication: No family at bedside; niece updated over the phone. Disposition Plan: Remains inpatient, continue electrolytes repletion (potassium is still low), also with low magnesium, continue telemetry monitoring, continue  IV remdesivir and IV steroids.  Given positive blood cultures will follow recommendations by infectious disease Dr. Megan Salon, who initially started treatment with cefazolin and vancomycin to cover Staphylococcus  species; speciation demonstrating MSSA bacteremia and antibiotics now narrowed to just IV cefazolin.  Repeat cultures negative after 24 hours.    Consultants:   ID (Dr. Megan Salon) with automatic counsultation secondary to positive for staph bacteremia  Cardiology service (secondary to acute systolic heart failure).  Procedures:   See below for x-ray reports  Antimicrobials:  Anti-infectives (From admission, onward)   Start     Dose/Rate Route Frequency Ordered Stop   11/10/19 1600  vancomycin (VANCOREADY) IVPB 1250 mg/250 mL  Status:  Discontinued     1,250 mg 166.7 mL/hr over 90 Minutes Intravenous Every 24 hours 11/09/19 1414 11/10/19 0949   11/09/19 1200  ceFAZolin (ANCEF) IVPB 2g/100 mL premix     2 g 200 mL/hr over 30 Minutes Intravenous Every 8 hours 11/09/19 1016     11/09/19 1000  remdesivir 100 mg in sodium chloride 0.9 % 100 mL IVPB     100 mg 200 mL/hr over 30 Minutes Intravenous Daily 11/08/19 0752 11/13/19 0959   11/09/19 1000  vancomycin (VANCOREADY) IVPB 1750 mg/350 mL  Status:  Discontinued     1,750 mg 175 mL/hr over 120 Minutes Intravenous  Once 11/09/19 0939 11/09/19 0940   11/09/19 1000  vancomycin (VANCOREADY) IVPB 2000 mg/400 mL     2,000 mg 200 mL/hr over 120 Minutes Intravenous  Once 11/09/19 0940 11/09/19 1703   11/08/19 0800  remdesivir 200 mg in sodium chloride 0.9% 250 mL IVPB     200 mg 580 mL/hr over 30 Minutes Intravenous Once 11/08/19 0752 11/08/19 1256       Subjective: No fever, no chest pain, no nausea, no vomiting.  Reports breathing is a stable and denies orthopnea.  Repeat blood cultures are pending and speciation demonstrating MSSA bacteremia.  Patient course in day 3 of IV remdesivir and the steroids for COVID-19 infection.  Objective: Vitals:   11/09/19 0638 11/09/19 1500 11/09/19 2102 11/10/19 0602  BP: (!) 174/90 119/78 130/86 127/72  Pulse: (!) 106 92 100 99  Resp: (!) _0 Temp: 98.5 F (36.9 C) 98.6 F (37 C) 98.3 F  (  36.8 C) 98.2 F (36.8 C)  TempSrc:  Oral Oral Oral  SpO2: 97% 94% 95% 97%  Weight: 85.8 kg       Intake/Output Summary (Last 24 hours) at 11/10/2019 1224 Last data filed at 11/10/2019 0914 Gross per 24 hour  Intake 600.95 ml  Output --  Net 600.95 ml   Filed Weights   11/07/19 2124 11/08/19 0449 11/09/19 7654  Weight: 86.4 kg 86.4 kg 85.8 kg    Examination: General exam: Alert, awake, oriented x 2; in no acute distress currently.  Reports breathing is a stable and denies orthopnea.  Patient is afebrile, no nausea, no vomiting.  Using 3 L nasal cannula supplementation at this time. Respiratory system: No frank crackles, no wheezing, positive rhonchi bilaterally.  No using accessory muscles.   Cardiovascular system: Irregular irregular, no rubs, no gallops, no JVD on exam.  Patient denies palpitations. Gastrointestinal system: Abdomen is nondistended, soft and nontender. No organomegaly or masses felt. Normal bowel sounds heard. Central nervous system: Alert and oriented. No focal neurological deficits. Extremities/skin: No C/C/E, +pedal pulses; right lower extremity with improvement in her abrasion and surrounding erythema; no active drainage or supination. Psychiatry: Mood & affect appropriate.    Data Reviewed: I have personally reviewed following labs and imaging studies  CBC: Recent Labs  Lab 11/07/19 1546 11/08/19 0623 11/09/19 0616 11/10/19 0841  WBC 13.5* 13.4* 11.8* 10.7*  NEUTROABS 12.0* 11.9* 10.9* 9.8*  HGB 11.2* 10.0* 10.4* 11.1*  HCT 35.4* 30.9* 31.8* 34.7*  MCV 106.9* 104.7* 102.6* 103.3*  PLT 116* 104* 107* 650*   Basic Metabolic Panel: Recent Labs  Lab 11/07/19 1546 11/08/19 0623 11/09/19 0616 11/10/19 0841  NA 138 139 136 135  K 2.5* 2.3* 2.5* 2.5*  CL 90* 91* 91* 89*  CO2 32 33* 31 33*  GLUCOSE 99 135* 127* 142*  BUN 15 16 26* 28*  CREATININE 1.43* 1.23* 1.34* 1.49*  CALCIUM 8.3* 8.4* 8.3* 8.3*  MG 1.1* 1.7 1.5* 1.8   GFR: Estimated  Creatinine Clearance: 36.6 mL/min (A) (by C-G formula based on SCr of 1.49 mg/dL (H)).   Liver Function Tests: Recent Labs  Lab 11/07/19 1546 11/08/19 0623 11/09/19 0616 11/10/19 0841  AST 26 24 38 32  ALT _0 ALKPHOS 61 53 48 52  BILITOT 1.3* 1.3* 0.9 0.6  PROT 6.5 5.8* 5.8* 6.0*  ALBUMIN 3.3* 2.8* 2.7* 2.6*   Recent Labs  Lab 11/07/19 1546  LIPASE 15   Coagulation Profile: Recent Labs  Lab 11/07/19 1546 11/08/19 0834 11/09/19 0616 11/10/19 0841  INR 1.6* 1.9* 2.1* 3.1*   Thyroid Function Tests: Recent Labs    11/07/19 2210  TSH 0.016*   Anemia Panel: Recent Labs    11/09/19 0616 11/10/19 0841  FERRITIN 64 65   Urine analysis:    Component Value Date/Time   COLORURINE YELLOW 11/07/2019 [place] 11/07/2019 1454   LABSPEC 1.010 11/07/2019 1454   PHURINE 5.0 11/07/2019 1454   GLUCOSEU NEGATIVE 11/07/2019 1454   HGBUR SMALL (A) 11/07/2019 1454   BILIRUBINUR NEGATIVE 11/07/2019 1454   KETONESUR NEGATIVE 11/07/2019 1454   PROTEINUR NEGATIVE 11/07/2019 1454   NITRITE NEGATIVE 11/07/2019 1454   LEUKOCYTESUR NEGATIVE 11/07/2019 1454    Recent Results (from the past 240 hour(s))  Urine culture     Status: None   Collection Time: 11/07/19  2:54 PM   Specimen: Urine, Clean Catch  Result Value Ref Range Status   Specimen Description  Final    URINE, CLEAN CATCH Performed at [facility], [address], [place], [place] [address]    Special Requests   Final    NONE Performed at [facility], [address], [place], [place] [address]    Culture   Final    NO GROWTH Performed at [facility], [place] [address], [place], [place] [address]    Report Status 11/08/2019 FINAL  Final  Culture, blood (routine x 2)     Status: None (Preliminary result)   Collection Time: 11/07/19  3:46 PM   Specimen: BLOOD RIGHT HAND  Result Value Ref Range Status   Specimen Description BLOOD RIGHT HAND  Final   Special Requests   Final     BOTTLES DRAWN AEROBIC ONLY Blood Culture adequate volume   Culture   Final    NO GROWTH 3 DAYS Performed at [facility], [address] [place], [place] [address]    Report Status PENDING  Incomplete  Culture, blood (routine x 2)     Status: Abnormal   Collection Time: 11/07/19 10:10 PM   Specimen: BLOOD RIGHT HAND  Result Value Ref Range Status   Specimen Description   Final    BLOOD RIGHT HAND Performed at [facility], [address] [place], [place] [address]    Special Requests   Final    BOTTLES DRAWN AEROBIC AND ANAEROBIC Blood Culture adequate volume Performed at [facility], [address] [place], [place] [address]    Culture  Setup Time   Final    GRAM POSITIVE COCCI AEROBIC BOTTLE Gram Stain Report Called to,Read Back By and Verified With: BENGTSON, P ON 11/08/19 AT 1750 BY LOY,C  PERFORMED AT [facility] Performed at [facility], [address], [place], [place] [address]    Culture STAPHYLOCOCCUS AUREUS (A)  Final   Report Status 11/10/2019 FINAL  Final   Organism ID, Bacteria STAPHYLOCOCCUS AUREUS  Final      Susceptibility   Staphylococcus aureus - MIC*    CIPROFLOXACIN <=0.5 SENSITIVE Sensitive     ERYTHROMYCIN >=8 RESISTANT Resistant     GENTAMICIN <=0.5 SENSITIVE Sensitive     OXACILLIN <=0.25 SENSITIVE Sensitive     TETRACYCLINE <=1 SENSITIVE Sensitive     VANCOMYCIN <=0.5 SENSITIVE Sensitive     TRIMETH/SULFA <=10 SENSITIVE Sensitive     CLINDAMYCIN RESISTANT Resistant     RIFAMPIN <=0.5 SENSITIVE Sensitive     Inducible Clindamycin POSITIVE Resistant     * STAPHYLOCOCCUS AUREUS  Culture, blood (routine x 2)     Status: None (Preliminary result)   Collection Time: 11/09/19 12:30 PM   Specimen: BLOOD RIGHT WRIST  Result Value Ref Range Status   Specimen Description BLOOD RIGHT WRIST  Final   Special Requests   Final    BOTTLES DRAWN AEROBIC AND ANAEROBIC Blood Culture adequate volume   Culture  Setup Time   Final    GRAM POSITIVE COCCI AEROBIC BOTTLE  ONLY Gram Stain Report Called to,Read Back By and Verified With: DEAN T. AT [id number] ON [id number] BY THOMPSON S. Performed at [facility], [address] [place],  [address]    Culture PENDING  Incomplete   Report Status PENDING  Incomplete  Culture, blood (routine x 2)     Status: None (Preliminary result)   Collection Time: 11/09/19 12:38 PM   Specimen: BLOOD RIGHT HAND  Result Value Ref Range Status   Specimen Description BLOOD RIGHT HAND  Final   Special Requests  Final    BOTTLES DRAWN AEROBIC AND ANAEROBIC Blood Culture adequate volume   Culture   Final    NO GROWTH < 24 HOURS Performed at [facility], [address], [place], [place] [address]    Report Status PENDING  Incomplete     Radiology Studies: ECHOCARDIOGRAM LIMITED  Result Date: 11/09/2019   ECHOCARDIOGRAM LIMITED REPORT   Patient Name:   SHAKEERAH GRADEL [facility] Date of Exam: 11/09/2019 Medical Rec #:  [id number]     Height:       68.5 in Accession #:    [id number]    Weight:       189.2 lb Date of Birth:  09-Apr-1941      BSA:          2.01 m Patient Age:    54 years      BP:           174/90 mmHg Patient Gender: F             HR:           106 bpm. Exam Location:  [facility]  Procedure: Limited Echo Indications:    Bacteremia  History:        Patient has prior history of Echocardiogram examinations, most                 recent 04/12/2018. Chronic Anticoagulation, COVID 19, GERD, Acute                 Rena l Injury.  Sonographer:    Leavy Cella RDCS (AE) Referring Phys: [place]  1. Left ventricular ejection fraction, by visual estimation, is approximately 35 to 40%. The left ventricle has moderately decreased function. There is mildly increased left ventricular wall thickness. Images are limited and not all myocardial segments visualized.  2. The left ventricle demonstrates global hypokinesis.  3. Left ventricular diastolic parameters are indeterminate in the setting of atrial fibrillation.  4. Global right  ventricle has mildly reduced systolic function.The right ventricular size is normal. no increase in right ventricular wall thickness.  5. Left atrial size was moderately dilated.  6. Presence of pericardial fat pad.  7. The mitral valve is grossly normal. Trivial mitral valve regurgitation.  8. The tricuspid valve was grossly normal. Tricuspid valve regurgitation is mild.  9. Tricuspid valve regurgitation is mild. 10. TR signal is inadequate for assessing pulmonary artery systolic pressure. 11. The inferior vena cava is normal in size with <50% respiratory variability, suggesting right atrial pressure of 8 mmHg. 12. No obvious valvular vegetations. FINDINGS  Left Ventricle: Left ventricular ejection fraction, by visual estimation, is 35 to 40%. The left ventricle has moderately decreased function. The left ventricle demonstrates global hypokinesis. The left ventricular internal cavity size was the left ventricle is normal in size. There is mildly increased left ventricular wall thickness. Left ventricular diastolic parameters are indeterminate. Right Ventricle: The right ventricular size is normal. No increase in right ventricular wall thickness. Global RV systolic function is has mildly reduced systolic function. Left Atrium: Left atrial size was moderately dilated. Right Atrium: Right atrial size was normal in size. Right atrial pressure is estimated at 8 mmHg. Pericardium: There is no evidence of pericardial effusion is seen. There is no evidence of pericardial effusion. Presence of pericardial fat pad. Mitral Valve: The mitral valve is grossly normal. There is mild calcification of the mitral valve leaflet(s). MV Area by PHT, 5.54 cm. MV PHT, 39.73 msec. Trivial mitral valve  regurgitation. Tricuspid Valve: The tricuspid valve is grossly normal. Tricuspid valve regurgitation is mild. Aortic Valve: The aortic valve is tricuspid. Aortic valve regurgitation is trivial. Mild aortic valve annular calcification.  Pulmonic Valve: The pulmonic valve was not well visualized. Pulmonic valve regurgitation is not visualized by color flow Doppler. Pulmonic regurgitation is not visualized by color flow Doppler. Aorta: The aortic root is normal in size and structure. Venous: The inferior vena cava is normal in size with less than 50% respiratory variability, suggesting right atrial pressure of 8 mmHg. Shunts: No atrial level shunt detected by color flow Doppler.  LEFT VENTRICLE         Normals PLAX 2D LVIDd:         4.04 cm 3.6 cm   Diastology                 Normals LVIDs:         2.79 cm 1.7 cm   LV e' lateral:   9.57 cm/s 6.42 cm/s LV PW:         1.36 cm 1.4 cm   LV E/e' lateral: 8.7       15.4 LV IVS:        1.26 cm 1.3 cm   LV e' medial:    6.42 cm/s 6.96 cm/s LV SV:         42 ml   79 ml    LV E/e' medial:  12.9      6.96 LV SV Index:   20.62   45 ml/m2  LEFT ATRIUM           Index       RIGHT ATRIUM           Index LA diam:      3.80 cm 1.89 cm/m  RA Area:     19.50 cm LA Vol (A2C): 43.6 ml 21.73 ml/m RA Volume:   54.30 ml  27.06 ml/m LA Vol (A4C): 96.4 ml 48.04 ml/m   AORTA                 Normals Ao Root diam: 2.80 cm 31 mm MITRAL VALVE              Normals MV Area (PHT): 5.54 cm MV PHT:        39.73 msec 55 ms MV Decel Time: 137 msec   187 ms MV E velocity: 82.90 cm/s 103 cm/s MV A velocity: 36.40 cm/s 70.3 cm/s MV E/A ratio:  2.28       1.5  Rozann Lesches MD Electronically signed by Rozann Lesches MD Signature Date/Time: 11/09/2019/11:19:40 AMThe mitral valve is grossly normal.    Final     Scheduled Meds: . vitamin C  500 mg Oral Daily  . latanoprost  1 drop Both Eyes QHS  . methylPREDNISolone (SOLU-MEDROL) injection  0.5 mg/kg Intravenous Q12H  . [START ON 11/11/2019] metoprolol succinate  50 mg Oral Daily  . metoprolol tartrate  25 mg Oral BID  . potassium chloride SA  40 mEq Oral TID  . sodium chloride flush  3 mL Intravenous Q12H  . [START ON 11/11/2019] spironolactone  12.5 mg Oral Daily  . torsemide   20 mg Oral BID  . Warfarin - Pharmacist Dosing Inpatient   Does not apply q1800  . zinc sulfate  220 mg Oral Daily   Continuous Infusions: .  ceFAZolin (ANCEF) IV 2 g (11/10/19 9826)  . potassium chloride    . remdesivir 100 mg  in NS 100 mL 100 mg (11/10/19 0925)     LOS: 3 days    Time spent: 35 minutes.  50% of the time dedicated to direct contact with the patient, coordinating care and discussing relevant ongoing clinical issues, including discussion about sepsis secondary to COVID-19 pneumonia and also positive MSSA bacteremia.  We discussed findings on her 2D echo and decrease in ejection fraction.  Family has been updated by phone and case discussed with consultants.  Nursing staff updated on plan of care   Barton Dubois, MD [facility] Pager [phone]   11/10/2019, 12:24 PM

## 2019-11-10 NOTE — Progress Notes (Signed)
ANTICOAGULATION CONSULT NOTE   Pharmacy Consult for: warfarin dosing Indication: atrial fibrillation  No Known Allergies  Patient Measurements: Weight: 189 lb 2.5 oz (85.8 kg) Heparin Dosing Weight:      Vital Signs: Temp: 98.2 F (36.8 C) (01/08 0602) Temp Source: Oral (01/08 0602) BP: 127/72 (01/08 0602) Pulse Rate: 99 (01/08 0602)  Labs: Recent Labs    11/07/19 1546 11/08/19 0623 11/08/19 0834 11/09/19 0616 11/10/19 0841  HGB   < > 10.0*  --  10.4* 11.1*  HCT  --  30.9*  --  31.8* 34.7*  PLT  --  104*  --  107* 121*  LABPROT  --   --  21.3* 23.1* 31.9*  INR  --   --  1.9* 2.1* 3.1*  CREATININE  --  1.23*  --  1.34* 1.49*   < > = values in this interval not displayed.    Estimated Creatinine Clearance: 36.6 mL/min (A) (by C-G formula based on SCr of 1.49 mg/dL (H)).   Medical History: Past Medical History:  Diagnosis Date  . Adenomatous polyps    -resected via colonoscopy in 2011  . Anemia   . Anxiety    MR  . Atrial fibrillation ([place])   . CHF (congestive heart failure) ([place])   . Chronic kidney disease   . Degenerative joint disease    status post right total hip arthroplasty  . Dysrhythmia   . GERD (gastroesophageal reflux disease)    -Barrett's esophagus  . Glaucoma   . Glaucoma   . Gout   . Hyperlipidemia   . Hypertension   . Mental retardation   . Peripheral neuropathy 09/12/2019  . Pneumonia   . Premature ventricular contractions   . Restless leg syndrome   . Shortness of breath dyspnea   . Wears dentures   . Wears glasses     Assessment: Pharmacy consulted to dose warfarin for this 79 yo female with atrial fibrillation on chronic anticoagulation with warfarin.   Home dose: 3mg  on Mon-Tues-Wed-Thurs-Fri only; nothing on Sat-Sun INR today: 3.1 CBC: H/H: 11.1/34.7     platelets 121K  Goal of Therapy:  INR 2-3 Monitor platelets by anticoagulation protocol: Yes   Plan:  Hold warfarin x 1 dose Daily INR and CBC Monitor for  signs/symptoms of bleeding.  Margot Ables, PharmD Clinical Pharmacist 11/10/2019 10:43 AM

## 2019-11-10 NOTE — Progress Notes (Signed)
Patient ID: [name], female   DOB: 06-23-1941, 79 y.o.   MRN: [id number]          [facility]    Date of Admission:  11/07/2019   Day 2 cefazolin         [name] had transient MSSA bacteremia in the setting of acute COVID-19 pneumonia.  There is no apparent evidence of endocarditis by reported exam or TTE.  I agree with not pursuing TEE now because of her COVID-19 infection.  Repeat blood cultures are negative at 24 hours.  I would hold off on PICC placement until they are negative at 48 hours.  Please call me for any infectious disease questions this weekend.         Michel Bickers, MD [facility] [phone] pager   [phone] cell 11/10/2019, 10:04 AM

## 2019-11-10 NOTE — Progress Notes (Signed)
CRITICAL VALUE ALERT  Critical Value:  Potassium 2.5  Date & Time Notied:  11/10/19 @ 1004  Provider Notified: [place]  Orders Received/Actions taken:   Awaiting order from provider

## 2019-11-10 NOTE — Care Management Important Message (Signed)
Important Message  Patient Details  Name: [name] MRN: [id number] Date of Birth: 04/09/1941   Medicare Important Message Given:  Yes(Shannon, RN will deliver letter to patient due to contact precautions)     Tommy Medal 11/10/2019, 2:31 PM

## 2019-11-10 NOTE — Consult Note (Addendum)
Cardiology Consult    Patient ID: KARYSA HEFT; [id number]; 1941/07/14   Admit date: 11/07/2019 Date of Consult: 11/10/2019  Primary Care Provider: Doree Albee, MD Primary Cardiologist: Kate Sable, MD   Patient Profile    [name] is a 79 y.o. female with past medical history of chronic diastolic CHF, persistent atrial fibrillation/flutter (on Coumadin for anticoagulation), presumed CAD (based off prior stress test in 08/2018), HTN and Stage 3 CKD who is being seen today for the evaluation of acute systolic CHF/new cardiomyopathy at the request of Dr. Dyann Kief.   History of Present Illness    [name] most recently had a telehealth visit with Dr. Bronson Ing in 08/2019 and reported improvement in her palpitations following dose titration of Lopressor to 25mg  BID. She denied any chest pain but did have chronic dyspnea on exertion. Was continued on her current medication regimen at that time.   She presented to [facility] ED on 11/07/2019 from Urgent Care for evaluation of worsening dyspnea, fever and sinus congestion. Oxygen saturations were initially in the low-80's on RA and improved with placement of 3L [place]. Initial labs showed WBC 13.5, Hgb 11.2, platelets 116, Na+ 138, K+ 2.5 and creatinine 1.43 (close to baseline). AST 26 and ALT 14. Lipase 15. BNP 435. Mg 1.1. TSH 0.016. Lactic Acid 3.6. Blood cultures obtained and positive for staph aureus. COVID positive. CXR showed diffuse bilateral airspace disease with small effusion and possible LLL consolidation. EKG shows significant artifact but appears most consistent with atrial fibrillation, HR 112 with PVC's with nonspecific IVCD.   She was admitted for severe sepsis in the setting of COVID-19 PNA. She has been started on Remdesivir and IV steroids. Given her blood cultures resulted positive for staph, she has been started on Vancomycin and Cefazolin. A limited echo showed an 35 to 40% with global hypokinesis and mildly reduced RV  function with no obvious valvular vegetations.  In regards to diuresis, she was started on her PTA Torsemide 20mg  BID. I&O's have not been recorded but weight has been stable at 190 lbs on admission to 189 lbs today. Close to her baseline as this was 190 lbs at the time of her office visit in 06/2019.  In talking with the patient today, she feels like her breathing has improved. Denies any specific orthopnea or PND. She is back on 3L [place] and saturations appropriate. She denies any chest pain or palpitations. She does have a wound on her right lower extremity which has been present for weeks per her report.    Past Medical History:  Diagnosis Date   Adenomatous polyps    -resected via colonoscopy in 2011   Anemia    Anxiety    MR   Atrial fibrillation (HCC)    CHF (congestive heart failure) (HCC)    Chronic kidney disease    Degenerative joint disease    status post right total hip arthroplasty   Dysrhythmia    GERD (gastroesophageal reflux disease)    -Barrett's esophagus   Glaucoma    Glaucoma    Gout    Hyperlipidemia    Hypertension    Mental retardation    Peripheral neuropathy 09/12/2019   Pneumonia    Premature ventricular contractions    Restless leg syndrome    Shortness of breath dyspnea    Wears dentures    Wears glasses     Past Surgical History:  Procedure Laterality Date   BIOPSY  02/22/2019   Procedure:  BIOPSY;  Surgeon: Rogene Houston, MD;  [facility];  Service: Endoscopy;;  Esophageal biopies   CATARACT EXTRACTION  2006   left eye   COLONOSCOPY W/ POLYPECTOMY  2011   COLONOSCOPY WITH PROPOFOL N/A 02/22/2019   Procedure: COLONOSCOPY WITH PROPOFOL;  Surgeon: Rogene Houston, MD;  [facility];  Service: Endoscopy;  Laterality: N/A;   ESOPHAGOGASTRODUODENOSCOPY N/A 01/01/2016   Procedure: ESOPHAGOGASTRODUODENOSCOPY (EGD);  Surgeon: Rogene Houston, MD;  [facility];  Service: Endoscopy;  Laterality: N/A;  200    ESOPHAGOGASTRODUODENOSCOPY (EGD) WITH PROPOFOL N/A 02/22/2019   Procedure: ESOPHAGOGASTRODUODENOSCOPY (EGD) WITH PROPOFOL;  Surgeon: Rogene Houston, MD;  [facility];  Service: Endoscopy;  Laterality: N/A;   EYE SURGERY     JOINT REPLACEMENT     x2   MULTIPLE TOOTH EXTRACTIONS     RADIOLOGY WITH ANESTHESIA Right 11/21/2015   Procedure: MRI OF RIGHT KNEE WITHOUT CONTAST    (RADIOLOGY WITH ANESTHESIA);  Surgeon: Medication Radiologist, MD;  Location: [facility];  Service: Radiology;  Laterality: Right;   RADIOLOGY WITH ANESTHESIA N/A 08/11/2018   Procedure: MRI of the BRAIN WITH ANESTHESIA WITHOUT CONTRAST;  Surgeon: Radiologist, Medication, MD;  Location: [facility];  Service: Radiology;  Laterality: N/A;   TONSILLECTOMY     TOTAL HIP ARTHROPLASTY  2006   Right     Home Medications:  Prior to Admission medications   Medication Sig Start Date End Date Taking? Authorizing Provider  acetaminophen (TYLENOL) 500 MG tablet Take 500-1,000 mg by mouth every 6 (six) hours as needed for moderate pain.    Yes [provider]  allopurinol (ZYLOPRIM) 100 MG tablet Take 100 mg by mouth at bedtime.    Yes [provider]  Brinzolamide-Brimonidine (SIMBRINZA) 1-0.2 % SUSP Place 1 drop into both eyes 2 (two) times daily.    Yes [provider]  Cholecalciferol (VITAMIN D3) 5000 units CAPS Take 5,000 Units by mouth daily.    Yes [provider]  esomeprazole (NEXIUM) 20 MG capsule Take 20 mg by mouth 2 (two) times daily before a meal.    Yes [provider]  ferrous gluconate (FERGON) 324 MG tablet Take 324 mg by mouth 3 (three) times a week.    Yes [provider]  fluticasone (FLONASE) 50 MCG/ACT nasal spray Place 1 spray into both nostrils daily.   Yes [provider]  gabapentin (NEURONTIN) 300 MG capsule Take 300 mg at bedtime by mouth. 08/23/17  Yes [provider]  latanoprost (XALATAN) 0.005 % ophthalmic solution Place 1 drop  into both eyes at bedtime.     Yes [provider]  loratadine (CLARITIN) 10 MG tablet Take 10 mg by mouth daily.    Yes [provider]  LORazepam (ATIVAN) 0.5 MG tablet Take 1 tablet (0.5 mg total) by mouth 2 (two) times daily as needed for anxiety. 10/02/19  Yes Gosrani, Nimish C, MD  meclizine (ANTIVERT) 12.5 MG tablet Take 12.5 mg by mouth 2 (two) times daily as needed for dizziness.    Yes [provider]  metoprolol tartrate (LOPRESSOR) 25 MG tablet TAKE 1 TABLET BY MOUTH 2 TIMES A DAY 09/21/19  Yes Herminio Commons, MD  neomycin-polymyxin-hydrocortisone (CORTISPORIN) OTIC solution INSTILL 2-3 DROPS IN [facility] EAR TWICE DAILY Patient taking differently: Place 2-3 drops into both ears 2 (two) times daily as needed (for ear pain).  09/21/19  Yes Doree Albee, MD  NP THYROID 90 MG tablet  Take 1 tablet by mouth once daily Patient taking differently: Take 90 mg by mouth every morning.  09/07/19  Yes Gosrani, Nimish C, MD  ondansetron (ZOFRAN-ODT) 4 MG disintegrating tablet TAKE 1 TABLET BY MOUTH EVERY 4 TO 6 HOURS AS NEEDED Patient taking differently: Take 4 mg by mouth every 4 (four) hours as needed for nausea or vomiting.  10/09/19  Yes Gosrani, Nimish C, MD  potassium chloride SA (K-DUR,KLOR-CON) 20 MEQ tablet Take 20 mEq by mouth 2 (two) times daily.    Yes [provider]  torsemide (DEMADEX) 20 MG tablet TAKE 1 TABLET BY MOUTH TWICE DAILY Patient taking differently: Take 20 mg by mouth 2 (two) times daily.  08/31/19  Yes Herminio Commons, MD  warfarin (COUMADIN) 3 MG tablet Take 3 mg by mouth every Monday, Tuesday, Wednesday, Thursday, and Friday. Does not take on Saturday/Sunday   Yes [provider]  ondansetron (ZOFRAN-ODT) 4 MG disintegrating tablet Take 4 mg by mouth every 4 (four) hours as needed for nausea or vomiting.  03/17/18   [provider]    Inpatient Medications: Scheduled Meds:  vitamin C  500 mg Oral Daily    latanoprost  1 drop Both Eyes QHS   methylPREDNISolone (SOLU-MEDROL) injection  0.5 mg/kg Intravenous Q12H   metoprolol tartrate  25 mg Oral BID   potassium chloride SA  40 mEq Oral TID   sodium chloride flush  3 mL Intravenous Q12H   torsemide  20 mg Oral BID   Warfarin - Pharmacist Dosing Inpatient   Does not apply q1800   zinc sulfate  220 mg Oral Daily   Continuous Infusions:   ceFAZolin (ANCEF) IV 2 g (11/10/19 2353)   remdesivir 100 mg in NS 100 mL 100 mg (11/10/19 0925)   PRN Meds: acetaminophen **OR** acetaminophen, guaiFENesin-dextromethorphan, Ipratropium-Albuterol, ondansetron (ZOFRAN) IV  Allergies:   No Known Allergies  Social History:   Social History   Socioeconomic History   Marital status: Single    Spouse name: Not on file   Number of children: Not on file   Years of education: Not on file   Highest education level: Not on file  Occupational History   Occupation: disabled  Tobacco Use   Smoking status: Never Smoker   Smokeless tobacco: Never Used  Substance and Sexual Activity   Alcohol use: No   Drug use: No   Sexual activity: Not Currently  Other Topics Concern   Not on file  Social History Narrative   Single,lives by herself.A family member (niece) now with her 8am-2pm,other family members rest of day.   Social Determinants of Health   Financial Resource Strain:    Difficulty of Paying Living Expenses: Not on file  Food Insecurity:    Worried About Charity fundraiser in the Last Year: Not on file   [employer] of Food in the Last Year: Not on file  Transportation Needs:    Lack of Transportation (Medical): Not on file   Lack of Transportation (Non-Medical): Not on file  Physical Activity:    Days of Exercise per Week: Not on file   Minutes of Exercise per Session: Not on file  Stress:    Feeling of Stress : Not on file  Social Connections:    Frequency of Communication with Friends and Family: Not on file   Frequency of Social Gatherings  with Friends and Family: Not on file   Attends Religious Services: Not on file   Active Member of  Clubs or Organizations: Not on file   Attends Archivist Meetings: Not on file   Marital Status: Not on file  Intimate Partner Violence:    Fear of Current or Ex-Partner: Not on file   Emotionally Abused: Not on file   Physically Abused: Not on file   Sexually Abused: Not on file     Family History:   Family History  Problem Relation Age of Onset   Hypertension Mother       Review of Systems    General:  No night sweats or weight changes. Positive for fever and chills.  Cardiovascular:  No chest pain, edema, orthopnea, palpitations, paroxysmal nocturnal dyspnea. Positive for dyspnea on exertion.  Dermatological: No rash, lesions/masses Respiratory: No cough, dyspnea Urologic: No hematuria, dysuria Abdominal:   No nausea, vomiting, diarrhea, bright red blood per rectum, melena, or hematemesis Neurologic:  No visual changes, wkns, changes in mental status. All other systems reviewed and are otherwise negative except as noted above.  Physical Exam/Data    Vitals:   11/09/19 0638 11/09/19 1500 11/09/19 2102 11/10/19 0602  BP: (!) 174/90 119/78 130/86 127/72  Pulse: (!) 106 92 100 99  Resp: (!) 22 20 20 20   Temp: 98.5 F (36.9 C) 98.6 F (37 C) 98.3 F (36.8 C) 98.2 F (36.8 C)  TempSrc:  Oral Oral Oral  SpO2: 97% 94% 95% 97%  Weight: 85.8 kg       Intake/Output Summary (Last 24 hours) at 11/10/2019 0954 Last data filed at 11/10/2019 0914 Gross per 24 hour  Intake 600.95 ml  Output --  Net 600.95 ml   Filed Weights   11/07/19 2124 11/08/19 0449 11/09/19 0638  Weight: 86.4 kg 86.4 kg 85.8 kg   Body mass index is 28.34 kg/m.   General: Pleasant elderly female appearing in NAD Psych: Normal affect. Neuro: Alert and oriented X 3. Moves all extremities spontaneously. HEENT: Normal  Neck: Supple without bruits or JVD. Lungs:  Resp regular and unlabored,  rhonchi throughout upper lung fields. Heart: Irregularly irregular. no s3, s4, or murmurs. Abdomen: Soft, non-tender, non-distended, BS + x 4.  Extremities: No clubbing, cyanosis or lower extremity edema.Wound along right lower extremity with no drainage appreciated.  DP/PT/Radials 2+ and equal bilaterally.   EKG:  The EKG was personally reviewed and demonstrates: Atrial fibrillation, HR 112 with PVC's with nonspecific IVCD .  Telemetry:  Telemetry was personally reviewed and demonstrates: Atrial fibrillation, HR in 80's to 90's with occasional PVC's.    Labs/Studies     Relevant CV Studies:  Echocardiogram: 04/2018 Study Conclusions   - Procedure narrative: Transthoracic echocardiography. Image   quality was adequate. Definity (contrast) was employed. - Left ventricle: The cavity size was normal. Wall thickness was   normal. Systolic function was normal. The estimated ejection   fraction was in the range of 55% to 60%. Wall motion was normal;   there were no regional wall motion abnormalities. Findings   consistent with left ventricular diastolic dysfunction, grade   indeterminate. Doppler parameters are consistent with high   ventricular filling pressure. - Aortic valve: Mildly calcified annulus. Trileaflet. There was   mild regurgitation. - Mitral valve: Mildly calcified annulus. Normal thickness leaflets   . There was mild regurgitation. - Right ventricle: Systolic function was reduced. - Tricuspid valve: There was moderate regurgitation. - Pulmonary arteries: PA peak pressure: 46 mm Hg (S).  NST: 08/2018 There was no ST segment deviation noted during stress. Findings consistent with prior small  distal anteroseptal myocardial infarction. There is no current ischemia. This is a low risk study. The left ventricular ejection fraction is low normal (51%).  Limited Echocardiogram: 11/09/2019 IMPRESSIONS      1. Left ventricular ejection fraction, by visual estimation, is  approximately 35 to 40%. The left ventricle has moderately decreased function. There is mildly increased left ventricular wall thickness. Images are limited and not all myocardial segments  visualized.  2. The left ventricle demonstrates global hypokinesis.  3. Left ventricular diastolic parameters are indeterminate in the setting of atrial fibrillation.  4. Global right ventricle has mildly reduced systolic function.The right ventricular size is normal. no increase in right ventricular wall thickness.  5. Left atrial size was moderately dilated.  6. Presence of pericardial fat pad.  7. The mitral valve is grossly normal. Trivial mitral valve regurgitation.  8. The tricuspid valve was grossly normal. Tricuspid valve regurgitation is mild.  9. Tricuspid valve regurgitation is mild. 10. TR signal is inadequate for assessing pulmonary artery systolic pressure. 11. The inferior vena cava is normal in size with <50% respiratory variability, suggesting right atrial pressure of 8 mmHg. 12. No obvious valvular vegetations.  Laboratory Data:  Chemistry Recent Labs  Lab 11/07/19 1546 11/08/19 0623 11/09/19 0616  NA 138 139 136  K 2.5* 2.3* 2.5*  CL 90* 91* 91*  CO2 32 33* 31  GLUCOSE 99 135* 127*  BUN 15 16 26*  CREATININE 1.43* 1.23* 1.34*  CALCIUM 8.3* 8.4* 8.3*  GFRNONAA 35* 42* 38*  GFRAA 41* 49* 44*  ANIONGAP 16* 15 14    Recent Labs  Lab 11/07/19 1546 11/08/19 0623 11/09/19 0616  PROT 6.5 5.8* 5.8*  ALBUMIN 3.3* 2.8* 2.7*  AST 26 24 38  ALT 14 12 17   ALKPHOS 61 53 48  BILITOT 1.3* 1.3* 0.9   Hematology Recent Labs  Lab 11/08/19 0623 11/09/19 0616 11/10/19 0841  WBC 13.4* 11.8* 10.7*  RBC 2.95* 3.10* 3.36*  HGB 10.0* 10.4* 11.1*  HCT 30.9* 31.8* 34.7*  MCV 104.7* 102.6* 103.3*  MCH 33.9 33.5 33.0  MCHC 32.4 32.7 32.0  RDW 16.0* 15.6* 15.6*  PLT 104* 107* 121*   Cardiac EnzymesNo results for input(s): TROPONINI in the last 168 hours. No results for input(s):  TROPIPOC in the last 168 hours.  BNP Recent Labs  Lab 11/07/19 1546  BNP 435.0*    DDimer  Recent Labs  Lab 11/08/19 0623 11/09/19 0616  DDIMER 0.38 0.34    Radiology/Studies:  DG Chest Port 1 View  Result Date: 11/07/2019 CLINICAL DATA:  Hypoxia.  COVID 19 positive EXAM: PORTABLE CHEST 1 VIEW COMPARISON:  03/20/2019 FINDINGS: Diffuse bilateral airspace disease most prominent in the left lung base. Probable bilateral pleural effusions. Very large hiatal hernia best seen on prior CT. Cardiac enlargement with vascular congestion. IMPRESSION: Diffuse bilateral airspace disease with small effusions. Left lower lobe consolidation. Findings most typical for heart failure with edema however given the COVID-19 status, pneumonia is quite possible. Electronically Signed   By: Franchot Gallo M.D.   On: 11/07/2019 15:18   ECHOCARDIOGRAM LIMITED  Result Date: 11/09/2019   ECHOCARDIOGRAM LIMITED REPORT   Patient Name:   [name] [facility] Date of Exam: 11/09/2019 Medical Rec #:  [id number]     Height:       68.5 in Accession #:    [id number]    Weight:       189.2 lb Date of Birth:  [birth year]      BSA:  2.01 m Patient Age:    89 years      BP:           174/90 mmHg Patient Gender: F             HR:           106 bpm. Exam Location:  [facility]  Procedure: Limited Echo Indications:    Bacteremia  History:        Patient has prior history of Echocardiogram examinations, most                 recent 04/12/2018. Chronic Anticoagulation, COVID 19, GERD, Acute                 Rena l Injury.  Sonographer:    Leavy Cella RDCS (AE) Referring Phys: [place]  1. Left ventricular ejection fraction, by visual estimation, is approximately 35 to 40%. The left ventricle has moderately decreased function. There is mildly increased left ventricular wall thickness. Images are limited and not all myocardial segments visualized.  2. The left ventricle demonstrates global hypokinesis.  3. Left ventricular  diastolic parameters are indeterminate in the setting of atrial fibrillation.  4. Global right ventricle has mildly reduced systolic function.The right ventricular size is normal. no increase in right ventricular wall thickness.  5. Left atrial size was moderately dilated.  6. Presence of pericardial fat pad.  7. The mitral valve is grossly normal. Trivial mitral valve regurgitation.  8. The tricuspid valve was grossly normal. Tricuspid valve regurgitation is mild.  9. Tricuspid valve regurgitation is mild. 10. TR signal is inadequate for assessing pulmonary artery systolic pressure. 11. The inferior vena cava is normal in size with <50% respiratory variability, suggesting right atrial pressure of 8 mmHg. 12. No obvious valvular vegetations. FINDINGS  Left Ventricle: Left ventricular ejection fraction, by visual estimation, is 35 to 40%. The left ventricle has moderately decreased function. The left ventricle demonstrates global hypokinesis. The left ventricular internal cavity size was the left ventricle is normal in size. There is mildly increased left ventricular wall thickness. Left ventricular diastolic parameters are indeterminate. Right Ventricle: The right ventricular size is normal. No increase in right ventricular wall thickness. Global RV systolic function is has mildly reduced systolic function. Left Atrium: Left atrial size was moderately dilated. Right Atrium: Right atrial size was normal in size. Right atrial pressure is estimated at 8 mmHg. Pericardium: There is no evidence of pericardial effusion is seen. There is no evidence of pericardial effusion. Presence of pericardial fat pad. Mitral Valve: The mitral valve is grossly normal. There is mild calcification of the mitral valve leaflet(s). MV Area by PHT, 5.54 cm. MV PHT, 39.73 msec. Trivial mitral valve regurgitation. Tricuspid Valve: The tricuspid valve is grossly normal. Tricuspid valve regurgitation is mild. Aortic Valve: The aortic valve is  tricuspid. Aortic valve regurgitation is trivial. Mild aortic valve annular calcification. Pulmonic Valve: The pulmonic valve was not well visualized. Pulmonic valve regurgitation is not visualized by color flow Doppler. Pulmonic regurgitation is not visualized by color flow Doppler. Aorta: The aortic root is normal in size and structure. Venous: The inferior vena cava is normal in size with less than 50% respiratory variability, suggesting right atrial pressure of 8 mmHg. Shunts: No atrial level shunt detected by color flow Doppler.  LEFT VENTRICLE         Normals PLAX 2D LVIDd:         4.04 cm 3.6 cm   Diastology  Normals LVIDs:         2.79 cm 1.7 cm   LV e' lateral:   9.57 cm/s 6.42 cm/s LV PW:         1.36 cm 1.4 cm   LV E/e' lateral: 8.7       15.4 LV IVS:        1.26 cm 1.3 cm   LV e' medial:    6.42 cm/s 6.96 cm/s LV SV:         42 ml   79 ml    LV E/e' medial:  12.9      6.96 LV SV Index:   20.62   45 ml/m2  LEFT ATRIUM           Index       RIGHT ATRIUM           Index LA diam:      3.80 cm 1.89 cm/m  RA Area:     19.50 cm LA Vol (A2C): 43.6 ml 21.73 ml/m RA Volume:   54.30 ml  27.06 ml/m LA Vol (A4C): 96.4 ml 48.04 ml/m   AORTA                 Normals Ao Root diam: 2.80 cm 31 mm MITRAL VALVE              Normals MV Area (PHT): 5.54 cm MV PHT:        39.73 msec 55 ms MV Decel Time: 137 msec   187 ms MV E velocity: 82.90 cm/s 103 cm/s MV A velocity: 36.40 cm/s 70.3 cm/s MV E/A ratio:  2.28       1.5  Rozann Lesches MD Electronically signed by Rozann Lesches MD Signature Date/Time: 11/09/2019/11:19:40 AMThe mitral valve is grossly normal.    Final      Assessment & Plan    1. Acute Systolic CHF Exacerbation/New Cardiomyopathy - presented with worsening dyspnea and fevers, found to be septic in the setting of COVID-19 PNA and to also have Staph Bacteremia. - echo this admission shows a reduced EF of 35 to 40% with global hypokinesis and mildly reduced RV function with no obvious  valvular vegetations. This is a new diagnosis for her as EF was previously 55-60% in 2019. - while she has rhonchi on examination she does not appear significantly volume overloaded. Difficult to hear lung sounds given air filter machine. Will add BNP to AM labs (was 435 on admission). Continue PO Torsemide 20mg  BID for now as she is at her baseline weight.  - she was on Lopressor 25mg  BID prior to admission. Will transition to Toprol-XL given her cardiomyopathy. Will also add Spironolactone 12.5mg  daily. Pending BP response, could consider addition of low-dose ARB as an outpatient or prior to discharge. Would not anticipate ischemic testing at this time given her multiple medical issues and would pursue medical therapy with plans for a repeat echo in 3 months once medications have been optimized.   2. Persistent Atrial Fibrillation - rates have been in the 80's to 90's. Given her cardiomyopathy, will convert Lopressor 25 mg BID to Toprol-XL 50mg  daily. Already received AM Lopressor so will switch tomorrow.  - she denies any evidence of active bleeding. Hgb stable at 11.1 today. Continue Coumadin for anticoagulation.   3. Presumed CAD - NST in 08/2018 showed findings consistent with a prior small distal anteroseptal myocardial infarction with no current ischemia. - her breathing has improved and she denies any chest pain.  -  not on ASA given the need for anticoagulation. Unclear why not on statin therapy as an outpatient. Remains on BB therapy.   4. Stage 3 CKD - creatinine 1.43 on admission, at 1.34 today which is at her baseline.   5. Staph Aureus Bacteremia - transthoracic echocardiogram without evidence of vegetations. Not a TEE candidate at this time given COVID status.  - ID following and she has been started on Cefazolin and Vancomycin.   6. COVID-19 PNA - respiratory status continues to improve. Remains on Remdesivir and IV steroids.   7. Hypokalemia - K+ 2.5 this AM. Supplementation  already ordered by the admitting team. Will plan to add Spironolactone as outlined above as this should help as well.   For questions or updates, please contact [place] Please consult [email or web address] for contact info under Cardiology/STEMI.  Signed, Erma Heritage, PA-C 11/10/2019, 9:54 AM Pager: [phone]  Patient examined chart reviewed Discussed care with primary patient and Pa. Exam with chronically ill female. Bilateral rhonchi no murmur JVP not elevated wound along right lateral aspect of leg In regard to DCM conservative Rx given current COVID infection and sepsis .Continue bid lasix appears euvolemic would like to keep on dry side with COVID. Ultimately in 4-6 weeks she will need To discusse with Dr Dwana Curd need for right and left heart cath Continue lopressor and add aldactone since she is prone to hypokalemia. Continue anticoagulation for her afib and COVID infection Sepsis Rx and COVID per ID.  With Cefazolin , Vancomycin , Remdesivir and steroids No evidence of SBE on TTE. Not a candidate for TEE given COVID infection   Jenkins Rouge MD [facility]

## 2019-11-11 LAB — COMPREHENSIVE METABOLIC PANEL
ALT: 9 U/L (ref 0–44)
AST: 24 U/L (ref 15–41)
Albumin: 2.8 g/dL — ABNORMAL LOW (ref 3.5–5.0)
Alkaline Phosphatase: 60 U/L (ref 38–126)
Anion gap: 13 (ref 5–15)
BUN: 29 mg/dL — ABNORMAL HIGH (ref 8–23)
CO2: 35 mmol/L — ABNORMAL HIGH (ref 22–32)
Calcium: 8.9 mg/dL (ref 8.9–10.3)
Chloride: 88 mmol/L — ABNORMAL LOW (ref 98–111)
Creatinine, Ser: 1.62 mg/dL — ABNORMAL HIGH (ref 0.44–1.00)
GFR calc Af Amer: 35 mL/min — ABNORMAL LOW (ref >60)
GFR calc non Af Amer: 30 mL/min — ABNORMAL LOW (ref >60)
Glucose, Bld: 161 mg/dL — ABNORMAL HIGH (ref 70–99)
Potassium: 3.1 mmol/L — ABNORMAL LOW (ref 3.5–5.1)
Sodium: 136 mmol/L (ref 135–145)
Total Bilirubin: 0.7 mg/dL (ref 0.3–1.2)
Total Protein: 6.6 g/dL (ref 6.5–8.1)

## 2019-11-11 LAB — CBC WITH DIFFERENTIAL/PLATELET
Abs Immature Granulocytes: 0.1 10*3/uL — ABNORMAL HIGH (ref 0.00–0.07)
Basophils Absolute: 0 10*3/uL (ref 0.0–0.1)
Basophils Relative: 0 %
Eosinophils Absolute: 0.3 10*3/uL (ref 0.0–0.5)
Eosinophils Relative: 2 %
HCT: 39.3 % (ref 36.0–46.0)
Hemoglobin: 12.7 g/dL (ref 12.0–15.0)
Immature Granulocytes: 1 %
Lymphocytes Relative: 2 %
Lymphs Abs: 0.3 10*3/uL — ABNORMAL LOW (ref 0.7–4.0)
MCH: 33.6 pg (ref 26.0–34.0)
MCHC: 32.3 g/dL (ref 30.0–36.0)
MCV: 104 fL — ABNORMAL HIGH (ref 80.0–100.0)
Monocytes Absolute: 0.5 10*3/uL (ref 0.1–1.0)
Monocytes Relative: 3 %
Neutro Abs: 14.7 10*3/uL — ABNORMAL HIGH (ref 1.7–7.7)
Neutrophils Relative %: 92 %
Platelets: 134 10*3/uL — ABNORMAL LOW (ref 150–400)
RBC: 3.78 MIL/uL — ABNORMAL LOW (ref 3.87–5.11)
RDW: 15.6 % — ABNORMAL HIGH (ref 11.5–15.5)
WBC: 15.9 10*3/uL — ABNORMAL HIGH (ref 4.0–10.5)
nRBC: 0 % (ref 0.0–0.2)

## 2019-11-11 LAB — PROTIME-INR
INR: 2.6 — ABNORMAL HIGH (ref 0.8–1.2)
Prothrombin Time: 28.1 seconds — ABNORMAL HIGH (ref 11.4–15.2)

## 2019-11-11 LAB — BRAIN NATRIURETIC PEPTIDE: B Natriuretic Peptide: 547 pg/mL — ABNORMAL HIGH (ref 0.0–100.0)

## 2019-11-11 LAB — MAGNESIUM: Magnesium: 1.7 mg/dL (ref 1.7–2.4)

## 2019-11-11 LAB — D-DIMER, QUANTITATIVE: D-Dimer, Quant: 0.62 ug/mL-FEU — ABNORMAL HIGH (ref 0.00–0.50)

## 2019-11-11 LAB — FERRITIN: Ferritin: 72 ng/mL (ref 11–307)

## 2019-11-11 LAB — C-REACTIVE PROTEIN: CRP: 15.1 mg/dL — ABNORMAL HIGH (ref <1.0)

## 2019-11-11 MED ORDER — WARFARIN SODIUM 2 MG PO TABS
3.0000 mg | ORAL_TABLET | Freq: Once | ORAL | Status: AC
  Administered 2019-11-11: 16:00:00 3 mg via ORAL
  Filled 2019-11-11: qty 1

## 2019-11-11 MED ORDER — LORAZEPAM 0.5 MG PO TABS
0.5000 mg | ORAL_TABLET | Freq: Two times a day (BID) | ORAL | Status: DC | PRN
  Administered 2019-11-11 – 2019-11-18 (×8): 0.5 mg via ORAL
  Filled 2019-11-11 (×8): qty 1

## 2019-11-11 NOTE — Progress Notes (Signed)
ANTICOAGULATION CONSULT NOTE   Pharmacy Consult for: warfarin dosing Indication: atrial fibrillation  No Known Allergies  Patient Measurements: Weight: 185 lb 6.5 oz (84.1 kg) Heparin Dosing Weight:      Vital Signs: Temp: 98.5 F (36.9 C) (01/09 0603) BP: 121/78 (01/09 0603) Pulse Rate: 109 (01/09 0603)  Labs: Recent Labs    11/09/19 0616 11/10/19 0841 11/11/19 0639  HGB 10.4* 11.1* 12.7  HCT 31.8* 34.7* 39.3  PLT 107* 121* 134*  LABPROT 23.1* 31.9* 28.1*  INR 2.1* 3.1* 2.6*  CREATININE 1.34* 1.49* 1.62*    Estimated Creatinine Clearance: 33.4 mL/min (A) (by C-G formula based on SCr of 1.62 mg/dL (H)).   Medical History: Past Medical History:  Diagnosis Date  . Adenomatous polyps    -resected via colonoscopy in 2011  . Anemia   . Anxiety    MR  . Atrial fibrillation ([place])   . CHF (congestive heart failure) ([place])   . Chronic kidney disease   . Degenerative joint disease    status post right total hip arthroplasty  . Dysrhythmia   . GERD (gastroesophageal reflux disease)    -Barrett's esophagus  . Glaucoma   . Glaucoma   . Gout   . Hyperlipidemia   . Hypertension   . Mental retardation   . Peripheral neuropathy 09/12/2019  . Pneumonia   . Premature ventricular contractions   . Restless leg syndrome   . Shortness of breath dyspnea   . Wears dentures   . Wears glasses     Assessment: Pharmacy consulted to dose warfarin for this 79 yo female with atrial fibrillation on chronic anticoagulation with warfarin.   Home dose: 3mg  on Mon-Tues-Wed-Thurs-Fri only; nothing on Sat-Sun INR : 3.1>>2.6 CBC: H/H: 11.1/34.7     platelets 121K  Goal of Therapy:  INR 2-3 Monitor platelets by anticoagulation protocol: Yes   Plan:  warfarin 3mg  x 1 today Daily INR and CBC Monitor for signs/symptoms of bleeding.  Isac Sarna, BS Pharm D, [place] Clinical Pharmacist Pager [phone] 11/11/2019 8:57 AM

## 2019-11-11 NOTE — Progress Notes (Signed)
PROGRESS NOTE    [name]  XLK:[id number] DOB: 1940/11/21 DOA: 11/07/2019 PCP: Doree Albee, MD    Brief Narrative:  79 y.o. female with medical history significant for chronic hypoxic respiratory failure in the setting of chronic diastolic heart failure on 3 L continuous supplemental oxygen, paroxysmal atrial fibrillation chronically anticoagulated on warfarin, hypertension, stage III chronic kidney disease with baseline creatinine of 1.4-1.5, developmental delay, who is admitted to [facility] on 11/07/2019 with COVID-19 pneumonia after resenting from home to [facility] emergency department for evaluation of objective fever.   In the setting of history of developmental delay associated with baseline mental status, the following history is obtained via my discussions with the emergency department physician as well as via chart review.  Per the patient's niece, with whom the patient lives, multiple family members to whom the patient has been exposed over the last week have been experiencing new onset rhinitis, rhinorrhea, sore throat, shortness of breath and subjective fever, although niece reports that none of these family members have been tested for Covid at this time.  Subsequent to the above symptoms experienced by patient's family members, the patient reportedly developed an objective fever over the last day, with temperature max at home noted to be 102-103 F.  [name] reportedly exhibited some dry heaving over that time in the absence of any diarrhea or rash.  Per niece, no increase in patient's supplemental oxygen demands relative to her baseline requirement of 3 L continuous nasal cannula.  Patient has undergone no recent traveling.  Past medical history is notable for a history of chronic diastolic heart failure.  Most recent echocardiogram was performed in June 2019 and showed left ventricle associated with normal cavity size and normal wall thickness, LVEF 55 to 60%, no  focal wall motion abnormalities, but showed evidence of diastolic dysfunction as well as mild mitral regurgitation and moderate tricuspid regurgitation.  Outpatient diuretic regimen consists of torsemide 20 mg p.o. twice daily.  While on this diuretic, the patient is also prescribed potassium chloride 20 mEq p.o. twice daily.  However, the patient's niece reports that the patient ran out of her potassium supplementation approximately 1 week ago, and has been unable to subsequently fill this prescription.    Assessment & Plan: 1-sepsis secondary to pneumonia due to COVID-19 virus and a Staph aureus (MSSA) bacteremia -Patient reports feeling short of breath, using 3.5 L nasal supplementation with (at home chronically on 2.5-3 L) -Chest x-ray demonstrating bilateral infiltrates -On presentation patient met sepsis criteria; with elevated respiratory rate, elevated heart rate, low-grade temperature, elevated WBCs and also elevated lactic acid. -Gentle fluid resuscitation was provided on admission -Patient started on remdesivir and IV steroids as part of coronavirus protocol treatment. -Follow inflammatory markers and clinical response. -As needed antipyretics and bronchodilators. -Speciation demonstrating MSSA bacteremia; following ID recommendations patient transitioned to IV cefazolin only.  Will follow infectious disease recommendation for length of treatment (most likely 4 weeks). -2D echo suboptimal, but no demonstrating vegetations. -Repeat blood cultures remain without growth after 36 hours. -PICC line placement tomorrow if afebrile and cultures remains neg -Staph aureus species resistant to erythromycin and clindamycin.   2-paroxysmal atrial fibrillation (HCC) -Chronic -CHADsVASC score 5 -Continue B-blocker for rate control and use of warfarin for secondary prevention; pharmacy dosing.  3-Chronic anticoagulation -Continue warfarin for anticoagulation as per pharmacy dosing. -Follow  INR. -No signs of overt bleeding.  4-Chronic kidney disease, stage 3 -Appears stable and at baseline. -Per GFR evaluation patient with  a stage IIIb chronic kidney disease. -Continue to follow renal function trend. -Pharmacy adjusting medications for renal function.  5-acute systolic heart failure  -Unable to assess diastolic function with atrial fibrillation at this time. -Patient with previous history of diastolic heart failure documentation and normal ejection fraction in 2017.   -Cardiology service has been consulted to assist with medication adjustment/optimization. -Continue to follow daily weights and low-sodium diet -Continue home torsemide -Currently euvolemic and stable. -Per cardiology recommendations previews BID lopressor has been transitioned to extended release Toprol and patient has been started on spironolactone. -Pending blood pressure stability might be able to start ARB prior to discharge.  6-hypokalemia and hypomagnesemia -Magnesium repleted and within normal limits currently -Potassium still low at 2.5 mEq; continue repletion and supplementation -Follow electrolytes trend. -Continue telemetry monitoring. -Following cardiology recommendations patient has been started on spironolactone.  7-chronic respiratory failure -Continue oxygen supplementation -Follow O2 sats and clinical response; wean oxygen supplementation down to baseline as tolerated. -Patient uses 2.5-3 L oxygen supplementation chronically; currently using 3 L. -Continue as needed bronchodilators.  8-hypothyroidism -Continue Synthroid.  DVT prophylaxis: Continue warfarin Code Status: Full Code Family Communication: No family at bedside; niece updated over the phone. Disposition Plan: Remains inpatient, continue electrolytes repletion (potassium is still low), also with low magnesium, continue telemetry monitoring, continue  IV remdesivir and IV steroids.  Given positive blood cultures will follow  recommendations by infectious disease Dr. Megan Salon, who initially started treatment with cefazolin and vancomycin to cover Staphylococcus species; speciation demonstrating MSSA bacteremia and antibiotics now narrowed to just IV cefazolin.  Repeat cultures negative after 36 hours.  Hopefully PICC line on 1/10; discharge 1/11 or 1/12  Consultants:   ID (Dr. Megan Salon) with automatic counsultation secondary to positive for staph bacteremia  Cardiology service (secondary to acute systolic heart failure).  Procedures:   See below for x-ray reports  Antimicrobials:  Anti-infectives (From admission, onward)   Start     Dose/Rate Route Frequency Ordered Stop   11/10/19 1600  vancomycin (VANCOREADY) IVPB 1250 mg/250 mL  Status:  Discontinued     1,250 mg 166.7 mL/hr over 90 Minutes Intravenous Every 24 hours 11/09/19 1414 11/10/19 0949   11/09/19 1200  ceFAZolin (ANCEF) IVPB 2g/100 mL premix     2 g 200 mL/hr over 30 Minutes Intravenous Every 8 hours 11/09/19 1016     11/09/19 1000  remdesivir 100 mg in sodium chloride 0.9 % 100 mL IVPB     100 mg 200 mL/hr over 30 Minutes Intravenous Daily 11/08/19 0752 11/13/19 0959   11/09/19 1000  vancomycin (VANCOREADY) IVPB 1750 mg/350 mL  Status:  Discontinued     1,750 mg 175 mL/hr over 120 Minutes Intravenous  Once 11/09/19 0939 11/09/19 0940   11/09/19 1000  vancomycin (VANCOREADY) IVPB 2000 mg/400 mL     2,000 mg 200 mL/hr over 120 Minutes Intravenous  Once 11/09/19 0940 11/09/19 1703   11/08/19 0800  remdesivir 200 mg in sodium chloride 0.9% 250 mL IVPB     200 mg 580 mL/hr over 30 Minutes Intravenous Once 11/08/19 0752 11/08/19 1256       Subjective: No fever, no SOB, no nausea, no vomiting. Patient also denies CP. Anxious and restless.   Objective: Vitals:   11/11/19 0603 11/11/19 0634 11/11/19 0738 11/11/19 1413  BP: 121/78   (!) 138/98  Pulse: (!) 109   (!) 127  Resp: 20   18  Temp: 98.5 F (36.9 C)   97.7 F (36.5 C)  TempSrc:     Oral  SpO2: 97%  96% 98%  Weight:  84.1 kg      Intake/Output Summary (Last 24 hours) at 11/11/2019 1602 Last data filed at 11/11/2019 0351 Gross per 24 hour  Intake 120 ml  Output 1050 ml  Net -930 ml   Filed Weights   11/08/19 0449 11/09/19 0638 11/11/19 0634  Weight: 86.4 kg 85.8 kg 84.1 kg    Examination: General exam: Alert, awake, oriented x 2; in no acute distress; anxious and mildly restless, no CP, no nausea, no vomiting. Good O2 sat on 3L supplementation. Respiratory system: positive rhonchi, no wheezing, decrease Bs at bases.  Cardiovascular system: irregular irregular; no rubs, no gallops. No JVD Gastrointestinal system: Abdomen is nondistended, soft and nontender. No organomegaly or masses felt. Normal bowel sounds heard. Central nervous system: Alert and oriented. No focal neurological deficits. Extremities: No C/C/E, +pedal pulses Skin: No rashes, lesions or ulcers Psychiatry: anxious, no hallucinations.    Data Reviewed: I have personally reviewed following labs and imaging studies  CBC: Recent Labs  Lab 11/07/19 1546 11/08/19 0623 11/09/19 0616 11/10/19 0841 11/11/19 0639  WBC 13.5* 13.4* 11.8* 10.7* 15.9*  NEUTROABS 12.0* 11.9* 10.9* 9.8* 14.7*  HGB 11.2* 10.0* 10.4* 11.1* 12.7  HCT 35.4* 30.9* 31.8* 34.7* 39.3  MCV 106.9* 104.7* 102.6* 103.3* 104.0*  PLT 116* 104* 107* 121* 338*   Basic Metabolic Panel: Recent Labs  Lab 11/07/19 1546 11/08/19 0623 11/09/19 0616 11/10/19 0841 11/11/19 0639  NA 138 139 136 135 136  K 2.5* 2.3* 2.5* 2.5* 3.1*  CL 90* 91* 91* 89* 88*  CO2 32 33* 31 33* 35*  GLUCOSE 99 135* 127* 142* 161*  BUN 15 16 26* 28* 29*  CREATININE 1.43* 1.23* 1.34* 1.49* 1.62*  CALCIUM 8.3* 8.4* 8.3* 8.3* 8.9  MG 1.1* 1.7 1.5* 1.8 1.7   GFR: Estimated Creatinine Clearance: 33.4 mL/min (A) (by C-G formula based on SCr of 1.62 mg/dL (H)).   Liver Function Tests: Recent Labs  Lab 11/07/19 1546 11/08/19 0623 11/09/19 0616  11/10/19 0841 11/11/19 0639  AST 26 24 38 32 24  ALT 14 12 17 17 9   ALKPHOS 61 53 48 52 60  BILITOT 1.3* 1.3* 0.9 0.6 0.7  PROT 6.5 5.8* 5.8* 6.0* 6.6  ALBUMIN 3.3* 2.8* 2.7* 2.6* 2.8*   Recent Labs  Lab 11/07/19 1546  LIPASE 15   Coagulation Profile: Recent Labs  Lab 11/07/19 1546 11/08/19 0834 11/09/19 0616 11/10/19 0841 11/11/19 0639  INR 1.6* 1.9* 2.1* 3.1* 2.6*   Anemia Panel: Recent Labs    11/10/19 0841 11/11/19 0639  FERRITIN 65 72   Urine analysis:    Component Value Date/Time   COLORURINE YELLOW 11/07/2019 [place] 11/07/2019 1454   LABSPEC 1.010 11/07/2019 1454   PHURINE 5.0 11/07/2019 1454   GLUCOSEU NEGATIVE 11/07/2019 1454   HGBUR SMALL (A) 11/07/2019 1454   BILIRUBINUR NEGATIVE 11/07/2019 1454   KETONESUR NEGATIVE 11/07/2019 1454   PROTEINUR NEGATIVE 11/07/2019 1454   NITRITE NEGATIVE 11/07/2019 1454   LEUKOCYTESUR NEGATIVE 11/07/2019 1454    Recent Results (from the past 240 hour(s))  Urine culture     Status: None   Collection Time: 11/07/19  2:54 PM   Specimen: Urine, Clean Catch  Result Value Ref Range Status   Specimen Description   Final    URINE, CLEAN CATCH Performed at [facility], [address] [place], [place] [address]    Special Requests  Final    NONE Performed at [facility], [address], [place], [place] [address]    Culture   Final    NO GROWTH Performed at [facility], [place] [address], [place], [place] [address]    Report Status 11/08/2019 FINAL  Final  Culture, blood (routine x 2)     Status: None (Preliminary result)   Collection Time: 11/07/19  3:46 PM   Specimen: BLOOD RIGHT HAND  Result Value Ref Range Status   Specimen Description BLOOD RIGHT HAND  Final   Special Requests   Final    BOTTLES DRAWN AEROBIC ONLY Blood Culture adequate volume   Culture   Final    NO GROWTH 4 DAYS Performed at [facility], [address], [place], [place] [address]    Report Status PENDING   Incomplete  Culture, blood (routine x 2)     Status: Abnormal   Collection Time: 11/07/19 10:10 PM   Specimen: BLOOD RIGHT HAND  Result Value Ref Range Status   Specimen Description   Final    BLOOD RIGHT HAND Performed at [facility], [address], [place], [place] [address]    Special Requests   Final    BOTTLES DRAWN AEROBIC AND ANAEROBIC Blood Culture adequate volume Performed at [facility], [address] [place], [place] [address]    Culture  Setup Time   Final    GRAM POSITIVE COCCI AEROBIC BOTTLE Gram Stain Report Called to,Read Back By and Verified With: BENGTSON, P ON 11/08/19 AT 1750 BY LOY,C  PERFORMED AT [facility] Performed at [facility], [address] [place], [place] [address]    Culture STAPHYLOCOCCUS AUREUS (A)  Final   Report Status 11/10/2019 FINAL  Final   Organism ID, Bacteria STAPHYLOCOCCUS AUREUS  Final      Susceptibility   Staphylococcus aureus - MIC*    CIPROFLOXACIN <=0.5 SENSITIVE Sensitive     ERYTHROMYCIN >=8 RESISTANT Resistant     GENTAMICIN <=0.5 SENSITIVE Sensitive     OXACILLIN <=0.25 SENSITIVE Sensitive     TETRACYCLINE <=1 SENSITIVE Sensitive     VANCOMYCIN <=0.5 SENSITIVE Sensitive     TRIMETH/SULFA <=10 SENSITIVE Sensitive     CLINDAMYCIN RESISTANT Resistant     RIFAMPIN <=0.5 SENSITIVE Sensitive     Inducible Clindamycin POSITIVE Resistant     * STAPHYLOCOCCUS AUREUS  Culture, blood (routine x 2)     Status: Abnormal (Preliminary result)   Collection Time: 11/09/19 12:30 PM   Specimen: BLOOD RIGHT WRIST  Result Value Ref Range Status   Specimen Description   Final    BLOOD RIGHT WRIST Performed at [facility], [address], [place], [place] [address]    Special Requests   Final    BOTTLES DRAWN AEROBIC AND ANAEROBIC Blood Culture adequate volume Performed at [facility], [address], [place], [place] [address]    Culture  Setup Time   Final    GRAM POSITIVE COCCI IN BOTH AEROBIC AND ANAEROBIC BOTTLES Gram Stain Report  Called to,Read Back By and Verified With: DEAN T. AT [id number] ON [id number] BY THOMPSON S. Performed at [facility], [address], [place], [place] [address]    Culture (A)  Final    STAPHYLOCOCCUS AUREUS SUSCEPTIBILITIES PERFORMED ON PREVIOUS CULTURE WITHIN THE LAST 5 DAYS. Performed at [facility], [place] [address], [place], [place] [address]    Report Status PENDING  Incomplete  Culture, blood (routine x 2)     Status: None (  Preliminary result)   Collection Time: 11/09/19 12:38 PM   Specimen: BLOOD RIGHT HAND  Result Value Ref Range Status   Specimen Description BLOOD RIGHT HAND  Final   Special Requests   Final    BOTTLES DRAWN AEROBIC AND ANAEROBIC Blood Culture adequate volume   Culture   Final    NO GROWTH 2 DAYS Performed at [facility], [address], [place], [place] [address]    Report Status PENDING  Incomplete  Culture, blood (routine x 2)     Status: None (Preliminary result)   Collection Time: 11/11/19 11:18 AM   Specimen: Right Antecubital; Blood  Result Value Ref Range Status   Specimen Description   Final    RIGHT ANTECUBITAL BOTTLES DRAWN AEROBIC AND ANAEROBIC   Special Requests   Final    Blood Culture adequate volume Performed at [facility], [address], [place], [place] [address]    Culture PENDING  Incomplete   Report Status PENDING  Incomplete  Culture, blood (routine x 2)     Status: None (Preliminary result)   Collection Time: 11/11/19 11:27 AM   Specimen: BLOOD RIGHT ARM  Result Value Ref Range Status   Specimen Description   Final    BLOOD RIGHT ARM BOTTLES DRAWN AEROBIC AND ANAEROBIC   Special Requests   Final    Blood Culture adequate volume Performed at [facility], [address], [place],  [address]    Culture PENDING  Incomplete   Report Status PENDING  Incomplete     Radiology Studies: No results found.  Scheduled Meds: . vitamin C  500 mg Oral Daily  . latanoprost  1 drop Both Eyes QHS  . methylPREDNISolone  (SOLU-MEDROL) injection  0.5 mg/kg Intravenous Q12H  . metoprolol succinate  50 mg Oral Daily  . potassium chloride SA  40 mEq Oral TID  . saccharomyces boulardii  250 mg Oral BID  . sodium chloride flush  3 mL Intravenous Q12H  . spironolactone  12.5 mg Oral Daily  . torsemide  20 mg Oral BID  . warfarin  3 mg Oral Once  . Warfarin - Pharmacist Dosing Inpatient   Does not apply q1800  . zinc sulfate  220 mg Oral Daily   Continuous Infusions: .  ceFAZolin (ANCEF) IV 2 g (11/11/19 1408)  . remdesivir 100 mg in NS 100 mL 100 mg (11/11/19 1006)     LOS: 4 days    Time spent: 35 minutes.   Barton Dubois, MD [facility] Pager [phone]   11/11/2019, 4:02 PM

## 2019-11-12 LAB — CULTURE, BLOOD (ROUTINE X 2): Special Requests: ADEQUATE

## 2019-11-12 LAB — COMPREHENSIVE METABOLIC PANEL
ALT: 7 U/L (ref 0–44)
AST: 25 U/L (ref 15–41)
Albumin: 2.5 g/dL — ABNORMAL LOW (ref 3.5–5.0)
Alkaline Phosphatase: 58 U/L (ref 38–126)
Anion gap: 15 (ref 5–15)
BUN: 31 mg/dL — ABNORMAL HIGH (ref 8–23)
CO2: 33 mmol/L — ABNORMAL HIGH (ref 22–32)
Calcium: 8.8 mg/dL — ABNORMAL LOW (ref 8.9–10.3)
Chloride: 89 mmol/L — ABNORMAL LOW (ref 98–111)
Creatinine, Ser: 1.51 mg/dL — ABNORMAL HIGH (ref 0.44–1.00)
GFR calc Af Amer: 38 mL/min — ABNORMAL LOW (ref >60)
GFR calc non Af Amer: 33 mL/min — ABNORMAL LOW (ref >60)
Glucose, Bld: 125 mg/dL — ABNORMAL HIGH (ref 70–99)
Potassium: 3.9 mmol/L (ref 3.5–5.1)
Sodium: 137 mmol/L (ref 135–145)
Total Bilirubin: 0.5 mg/dL (ref 0.3–1.2)
Total Protein: 6 g/dL — ABNORMAL LOW (ref 6.5–8.1)

## 2019-11-12 LAB — CBC WITH DIFFERENTIAL/PLATELET
Abs Immature Granulocytes: 0.13 10*3/uL — ABNORMAL HIGH (ref 0.00–0.07)
Basophils Absolute: 0 10*3/uL (ref 0.0–0.1)
Basophils Relative: 0 %
Eosinophils Absolute: 0 10*3/uL (ref 0.0–0.5)
Eosinophils Relative: 0 %
HCT: 36.2 % (ref 36.0–46.0)
Hemoglobin: 11.7 g/dL — ABNORMAL LOW (ref 12.0–15.0)
Immature Granulocytes: 1 %
Lymphocytes Relative: 4 %
Lymphs Abs: 0.5 10*3/uL — ABNORMAL LOW (ref 0.7–4.0)
MCH: 33 pg (ref 26.0–34.0)
MCHC: 32.3 g/dL (ref 30.0–36.0)
MCV: 102 fL — ABNORMAL HIGH (ref 80.0–100.0)
Monocytes Absolute: 0.4 10*3/uL (ref 0.1–1.0)
Monocytes Relative: 3 %
Neutro Abs: 11.2 10*3/uL — ABNORMAL HIGH (ref 1.7–7.7)
Neutrophils Relative %: 92 %
Platelets: 147 10*3/uL — ABNORMAL LOW (ref 150–400)
RBC: 3.55 MIL/uL — ABNORMAL LOW (ref 3.87–5.11)
RDW: 15.4 % (ref 11.5–15.5)
WBC: 12.2 10*3/uL — ABNORMAL HIGH (ref 4.0–10.5)
nRBC: 0 % (ref 0.0–0.2)

## 2019-11-12 LAB — D-DIMER, QUANTITATIVE: D-Dimer, Quant: 0.51 ug/mL-FEU — ABNORMAL HIGH (ref 0.00–0.50)

## 2019-11-12 LAB — PROTIME-INR
INR: 2.8 — ABNORMAL HIGH (ref 0.8–1.2)
Prothrombin Time: 29.3 seconds — ABNORMAL HIGH (ref 11.4–15.2)

## 2019-11-12 LAB — C-REACTIVE PROTEIN: CRP: 9.8 mg/dL — ABNORMAL HIGH (ref <1.0)

## 2019-11-12 LAB — FERRITIN: Ferritin: 82 ng/mL (ref 11–307)

## 2019-11-12 LAB — MAGNESIUM: Magnesium: 1.7 mg/dL (ref 1.7–2.4)

## 2019-11-12 NOTE — Progress Notes (Signed)
PROGRESS NOTE    LAKENDRIA NICASTRO  JWL:[id number] DOB: 03-12-1941 DOA: 11/07/2019 PCP: Doree Albee, MD    Brief Narrative:  79 y.o. female with medical history significant for chronic hypoxic respiratory failure in the setting of chronic diastolic heart failure on 3 L continuous supplemental oxygen, paroxysmal atrial fibrillation chronically anticoagulated on warfarin, hypertension, stage III chronic kidney disease with baseline creatinine of 1.4-1.5, developmental delay, who is admitted to [facility] on 11/07/2019 with COVID-19 pneumonia after resenting from home to [facility] emergency department for evaluation of objective fever.   In the setting of history of developmental delay associated with baseline mental status, the following history is obtained via my discussions with the emergency department physician as well as via chart review.  Per the patient's niece, with whom the patient lives, multiple family members to whom the patient has been exposed over the last week have been experiencing new onset rhinitis, rhinorrhea, sore throat, shortness of breath and subjective fever, although niece reports that none of these family members have been tested for Covid at this time.  Subsequent to the above symptoms experienced by patient's family members, the patient reportedly developed an objective fever over the last day, with temperature max at home noted to be 102-103 F.  [name] reportedly exhibited some dry heaving over that time in the absence of any diarrhea or rash.  Per niece, no increase in patient's supplemental oxygen demands relative to her baseline requirement of 3 L continuous nasal cannula.  Patient has undergone no recent traveling.  Past medical history is notable for a history of chronic diastolic heart failure.  Most recent echocardiogram was performed in June 2019 and showed left ventricle associated with normal cavity size and normal wall thickness, LVEF 55 to 60%, no  focal wall motion abnormalities, but showed evidence of diastolic dysfunction as well as mild mitral regurgitation and moderate tricuspid regurgitation.  Outpatient diuretic regimen consists of torsemide 20 mg p.o. twice daily.  While on this diuretic, the patient is also prescribed potassium chloride 20 mEq p.o. twice daily.  However, the patient's niece reports that the patient ran out of her potassium supplementation approximately 1 week ago, and has been unable to subsequently fill this prescription.    Assessment & Plan: 1-sepsis secondary to pneumonia due to COVID-19 virus and a Staph aureus (MSSA) bacteremia -Patient reports feeling short of breath, using 3.5 L nasal supplementation with (at home chronically on 2.5-3 L) -Chest x-ray demonstrating bilateral infiltrates -On presentation patient met sepsis criteria; with elevated respiratory rate, elevated heart rate, low-grade temperature, elevated WBCs and also elevated lactic acid. -Gentle fluid resuscitation was provided on admission -Patient started on remdesivir and IV steroids as part of coronavirus protocol treatment. -Follow inflammatory markers and clinical response. -As needed antipyretics and bronchodilators. -Speciation demonstrating MSSA bacteremia; following ID recommendations patient transitioned to IV cefazolin only.  Will follow infectious disease recommendation for length of treatment (most likely 4 weeks). -2D echo suboptimal, but no demonstrating vegetations. -Repeat blood cultures returned positive; continue IV antibiotics and repeat cultures.  Patient needs 48 hours with negative cultures before PICC line can be place. -PICC line placement tomorrow if afebrile and cultures remains neg -Staph aureus species resistant to erythromycin and clindamycin.   2-paroxysmal atrial fibrillation (HCC) -Chronic -CHADsVASC score 5 -Continue B-blocker for rate control and use of warfarin for secondary prevention; pharmacy  dosing.  3-Chronic anticoagulation -Continue warfarin for anticoagulation as per pharmacy dosing. -Follow INR. -No signs of overt bleeding.  4-Chronic kidney disease, stage 3 -Appears stable and at baseline. -Per GFR evaluation patient with a stage IIIb chronic kidney disease. -Continue to follow renal function trend. -Pharmacy adjusting medications for renal function.  5-acute systolic heart failure  -Unable to assess diastolic function with atrial fibrillation at this time. -Patient with previous history of diastolic heart failure documentation and normal ejection fraction in 2017.   -Cardiology service has been consulted to assist with medication adjustment/optimization. -Continue to follow daily weights and low-sodium diet -Continue home torsemide -Currently euvolemic and stable. -Per cardiology recommendations previews BID lopressor has been transitioned to extended release Toprol and patient has been started on spironolactone. -Pending blood pressure stability might be able to start ARB prior to discharge.  6-hypokalemia and hypomagnesemia -Magnesium repleted and within normal limits currently -Potassium still low at 2.5 mEq; continue repletion and supplementation -Follow electrolytes trend. -Continue telemetry monitoring. -Following cardiology recommendations patient has been started on spironolactone.  7-chronic respiratory failure -Continue oxygen supplementation -Follow O2 sats and clinical response; wean oxygen supplementation down to baseline as tolerated. -Patient uses 2.5-3 L oxygen supplementation chronically; currently using 3 L. -Continue as needed bronchodilators.  8-hypothyroidism -Continue Synthroid.  DVT prophylaxis: Continue warfarin Code Status: Full Code Family Communication: No family at bedside; niece updated over the phone. Disposition Plan: Remains inpatient, continue electrolytes repletion (potassium is still low), also with low magnesium,  continue telemetry monitoring, continue  IV remdesivir and IV steroids.  Given positive blood cultures will follow recommendations by infectious disease Dr. Megan Salon, who initially started treatment with cefazolin and vancomycin to cover Staphylococcus species; speciation demonstrating MSSA bacteremia and antibiotics now narrowed to just IV cefazolin.  Repeat cultures returned to be positive; PICC line placement on hold.  Repeat blood cultures pending.  Consultants:   ID (Dr. Megan Salon) with automatic counsultation secondary to positive for staph bacteremia  Cardiology service (secondary to acute systolic heart failure).  Procedures:   See below for x-ray reports  Antimicrobials:  Anti-infectives (From admission, onward)   Start     Dose/Rate Route Frequency Ordered Stop   11/10/19 1600  vancomycin (VANCOREADY) IVPB 1250 mg/250 mL  Status:  Discontinued     1,250 mg 166.7 mL/hr over 90 Minutes Intravenous Every 24 hours 11/09/19 1414 11/10/19 0949   11/09/19 1200  ceFAZolin (ANCEF) IVPB 2g/100 mL premix     2 g 200 mL/hr over 30 Minutes Intravenous Every 8 hours 11/09/19 1016     11/09/19 1000  remdesivir 100 mg in sodium chloride 0.9 % 100 mL IVPB     100 mg 200 mL/hr over 30 Minutes Intravenous Daily 11/08/19 0752 11/12/19 1005   11/09/19 1000  vancomycin (VANCOREADY) IVPB 1750 mg/350 mL  Status:  Discontinued     1,750 mg 175 mL/hr over 120 Minutes Intravenous  Once 11/09/19 0939 11/09/19 0940   11/09/19 1000  vancomycin (VANCOREADY) IVPB 2000 mg/400 mL     2,000 mg 200 mL/hr over 120 Minutes Intravenous  Once 11/09/19 0940 11/09/19 1703   11/08/19 0800  remdesivir 200 mg in sodium chloride 0.9% 250 mL IVPB     200 mg 580 mL/hr over 30 Minutes Intravenous Once 11/08/19 0752 11/08/19 1256       Subjective: No fever, no shortness of breath, no nausea, no vomiting.  Patient reports no chest pain and expressing breathing is a stable.  Calmer today.  Objective: Vitals:    11/11/19 2157 11/12/19 0500 11/12/19 0606 11/12/19 1334  BP: 131/67  (!) 125/97 (!) 139/96  Pulse: (!) 122  (!) 114 (!) 126  Resp:   18 18  Temp: 97.7 F (36.5 C)  97.7 F (36.5 C) 97.7 F (36.5 C)  TempSrc: Oral  Oral Oral  SpO2: 97%  98% 95%  Weight:  84.1 kg      Intake/Output Summary (Last 24 hours) at 11/12/2019 1356 Last data filed at 11/12/2019 0500 Gross per 24 hour  Intake 220 ml  Output 852 ml  Net -632 ml   Filed Weights   11/09/19 0638 11/11/19 0634 11/12/19 0500  Weight: 85.8 kg 84.1 kg 84.1 kg    Examination: General exam: Alert, awake, oriented x 2, answering questions appropriately and, today.  Patient is afebrile.  No nausea, no vomiting, no chest pain.  Good O2 sat on chronic 3 L oxygen supplementation. Respiratory system: Scattered rhonchi, no wheezing, decreased breath sounds at the bases without frank crackles; no using accessory muscle.   Cardiovascular system: IRREGULARLY IRREGULAR; no rubs, no gallops, no JVD.  Gastrointestinal system: Abdomen is nondistended, soft and nontender. No organomegaly or masses felt. Normal bowel sounds heard. Central nervous system: Alert and oriented. No focal neurological deficits. Extremities: No C/C/E, +pedal pulses Skin: No rashes, no petechiae. Psychiatry: Mood and affect appropriate.  No hallucinations.   Data Reviewed: I have personally reviewed following labs and imaging studies  CBC: Recent Labs  Lab 11/08/19 0623 11/09/19 0616 11/10/19 0841 11/11/19 0639 11/12/19 0609  WBC 13.4* 11.8* 10.7* 15.9* 12.2*  NEUTROABS 11.9* 10.9* 9.8* 14.7* 11.2*  HGB 10.0* 10.4* 11.1* 12.7 11.7*  HCT 30.9* 31.8* 34.7* 39.3 36.2  MCV 104.7* 102.6* 103.3* 104.0* 102.0*  PLT 104* 107* 121* 134* 269*   Basic Metabolic Panel: Recent Labs  Lab 11/08/19 0623 11/09/19 0616 11/10/19 0841 11/11/19 0639 11/12/19 0609  NA 139 136 135 136 137  K 2.3* 2.5* 2.5* 3.1* 3.9  CL 91* 91* 89* 88* 89*  CO2 33* 31 33* 35* 33*  GLUCOSE  135* 127* 142* 161* 125*  BUN 16 26* 28* 29* 31*  CREATININE 1.23* 1.34* 1.49* 1.62* 1.51*  CALCIUM 8.4* 8.3* 8.3* 8.9 8.8*  MG 1.7 1.5* 1.8 1.7 1.7   GFR: Estimated Creatinine Clearance: 35.8 mL/min (A) (by C-G formula based on SCr of 1.51 mg/dL (H)).   Liver Function Tests: Recent Labs  Lab 11/08/19 0623 11/09/19 0616 11/10/19 0841 11/11/19 0639 11/12/19 0609  AST 24 38 32 24 25  ALT 12 17 17 9 7   ALKPHOS 53 48 52 60 58  BILITOT 1.3* 0.9 0.6 0.7 0.5  PROT 5.8* 5.8* 6.0* 6.6 6.0*  ALBUMIN 2.8* 2.7* 2.6* 2.8* 2.5*   Recent Labs  Lab 11/07/19 1546  LIPASE 15   Coagulation Profile: Recent Labs  Lab 11/08/19 0834 11/09/19 0616 11/10/19 0841 11/11/19 0639 11/12/19 0609  INR 1.9* 2.1* 3.1* 2.6* 2.8*   Anemia Panel: Recent Labs    11/11/19 0639 11/12/19 0609  FERRITIN 72 82   Urine analysis:    Component Value Date/Time   COLORURINE YELLOW 11/07/2019 [place] 11/07/2019 1454   LABSPEC 1.010 11/07/2019 1454   PHURINE 5.0 11/07/2019 1454   GLUCOSEU NEGATIVE 11/07/2019 1454   HGBUR SMALL (A) 11/07/2019 1454   BILIRUBINUR NEGATIVE 11/07/2019 1454   KETONESUR NEGATIVE 11/07/2019 1454   PROTEINUR NEGATIVE 11/07/2019 1454   NITRITE NEGATIVE 11/07/2019 1454   LEUKOCYTESUR NEGATIVE 11/07/2019 1454    Recent Results (from the past 240 hour(s))  Urine culture     Status: None  Collection Time: 11/07/19  2:54 PM   Specimen: Urine, Clean Catch  Result Value Ref Range Status   Specimen Description   Final    URINE, CLEAN CATCH Performed at [facility], [address], [place], [place] [address]    Special Requests   Final    NONE Performed at [facility], [address] [place], [place] [address]    Culture   Final    NO GROWTH Performed at [facility], [place] [address] [place], [place] [address]    Report Status 11/08/2019 FINAL  Final  Culture, blood (routine x 2)     Status: None (Preliminary result)   Collection Time: 11/07/19   3:46 PM   Specimen: BLOOD RIGHT HAND  Result Value Ref Range Status   Specimen Description BLOOD RIGHT HAND  Final   Special Requests   Final    BOTTLES DRAWN AEROBIC ONLY Blood Culture adequate volume   Culture   Final    NO GROWTH 4 DAYS Performed at [facility], [address], [place], [place] [address]    Report Status PENDING  Incomplete  Culture, blood (routine x 2)     Status: Abnormal   Collection Time: 11/07/19 10:10 PM   Specimen: BLOOD RIGHT HAND  Result Value Ref Range Status   Specimen Description   Final    BLOOD RIGHT HAND Performed at [facility], [address], [place], [place] [address]    Special Requests   Final    BOTTLES DRAWN AEROBIC AND ANAEROBIC Blood Culture adequate volume Performed at [facility], [address], [place], [place] [address]    Culture  Setup Time   Final    GRAM POSITIVE COCCI AEROBIC BOTTLE Gram Stain Report Called to,Read Back By and Verified With: BENGTSON, P ON 11/08/19 AT 1750 BY LOY,C  PERFORMED AT [facility] Performed at [facility], [address] [place], [place] [address]    Culture STAPHYLOCOCCUS AUREUS (A)  Final   Report Status 11/10/2019 FINAL  Final   Organism ID, Bacteria STAPHYLOCOCCUS AUREUS  Final      Susceptibility   Staphylococcus aureus - MIC*    CIPROFLOXACIN <=0.5 SENSITIVE Sensitive     ERYTHROMYCIN >=8 RESISTANT Resistant     GENTAMICIN <=0.5 SENSITIVE Sensitive     OXACILLIN <=0.25 SENSITIVE Sensitive     TETRACYCLINE <=1 SENSITIVE Sensitive     VANCOMYCIN <=0.5 SENSITIVE Sensitive     TRIMETH/SULFA <=10 SENSITIVE Sensitive     CLINDAMYCIN RESISTANT Resistant     RIFAMPIN <=0.5 SENSITIVE Sensitive     Inducible Clindamycin POSITIVE Resistant     * STAPHYLOCOCCUS AUREUS  Culture, blood (routine x 2)     Status: Abnormal   Collection Time: 11/09/19 12:30 PM   Specimen: BLOOD RIGHT WRIST  Result Value Ref Range Status   Specimen Description   Final    BLOOD RIGHT WRIST Performed at [facility],  [address] [place], [place] [address]    Special Requests   Final    BOTTLES DRAWN AEROBIC AND ANAEROBIC Blood Culture adequate volume Performed at [facility], [address], [place],  [address]    Culture  Setup Time   Final    GRAM POSITIVE COCCI IN BOTH AEROBIC AND ANAEROBIC BOTTLES Gram Stain Report Called to,Read Back By and Verified With: DEAN T. AT [id number] ON [id number] BY THOMPSON S. Performed at [facility], [address] [place],  [address]    Culture (A)  Final  STAPHYLOCOCCUS AUREUS SUSCEPTIBILITIES PERFORMED ON PREVIOUS CULTURE WITHIN THE LAST 5 DAYS. Performed at [facility], [place] [address] [place], [place] [address]    Report Status 11/12/2019 FINAL  Final  Culture, blood (routine x 2)     Status: None (Preliminary result)   Collection Time: 11/09/19 12:38 PM   Specimen: BLOOD RIGHT HAND  Result Value Ref Range Status   Specimen Description BLOOD RIGHT HAND  Final   Special Requests   Final    BOTTLES DRAWN AEROBIC AND ANAEROBIC Blood Culture adequate volume   Culture   Final    NO GROWTH 2 DAYS Performed at [facility], [address], [place], [place] [address]    Report Status PENDING  Incomplete  Culture, blood (routine x 2)     Status: None (Preliminary result)   Collection Time: 11/11/19 11:18 AM   Specimen: Right Antecubital; Blood  Result Value Ref Range Status   Specimen Description   Final    RIGHT ANTECUBITAL BOTTLES DRAWN AEROBIC AND ANAEROBIC   Special Requests   Final    Blood Culture adequate volume Performed at [facility], [address], [place], [place] [address]    Culture PENDING  Incomplete   Report Status PENDING  Incomplete  Culture, blood (routine x 2)     Status: None (Preliminary result)   Collection Time: 11/11/19 11:27 AM   Specimen: BLOOD RIGHT ARM  Result Value Ref Range Status   Specimen Description   Final    BLOOD RIGHT ARM BOTTLES DRAWN AEROBIC AND ANAEROBIC   Special Requests   Final    Blood  Culture adequate volume Performed at [facility], [address], [place], [place] [address]    Culture PENDING  Incomplete   Report Status PENDING  Incomplete     Radiology Studies: No results found.  Scheduled Meds: . vitamin C  500 mg Oral Daily  . latanoprost  1 drop Both Eyes QHS  . methylPREDNISolone (SOLU-MEDROL) injection  0.5 mg/kg Intravenous Q12H  . metoprolol succinate  50 mg Oral Daily  . potassium chloride SA  40 mEq Oral TID  . saccharomyces boulardii  250 mg Oral BID  . sodium chloride flush  3 mL Intravenous Q12H  . spironolactone  12.5 mg Oral Daily  . torsemide  20 mg Oral BID  . Warfarin - Pharmacist Dosing Inpatient   Does not apply q1800  . zinc sulfate  220 mg Oral Daily   Continuous Infusions: .  ceFAZolin (ANCEF) IV 2 g (11/12/19 1334)     LOS: 5 days    Time spent: 35 minutes.   Barton Dubois, MD [facility] Pager [phone]   11/12/2019, 1:56 PM

## 2019-11-12 NOTE — Progress Notes (Signed)
ANTICOAGULATION CONSULT NOTE   Pharmacy Consult for: warfarin dosing Indication: atrial fibrillation  No Known Allergies  Patient Measurements: Weight: 185 lb 6.5 oz (84.1 kg) Heparin Dosing Weight:      Vital Signs: Temp: 97.7 F (36.5 C) (01/10 0606) Temp Source: Oral (01/10 0606) BP: 125/97 (01/10 0606) Pulse Rate: 114 (01/10 0606)  Labs: Recent Labs    11/10/19 0841 11/11/19 0639 11/12/19 0609  HGB 11.1* 12.7 11.7*  HCT 34.7* 39.3 36.2  PLT 121* 134* 147*  LABPROT 31.9* 28.1* 29.3*  INR 3.1* 2.6* 2.8*  CREATININE 1.49* 1.62* 1.51*    Estimated Creatinine Clearance: 35.8 mL/min (A) (by C-G formula based on SCr of 1.51 mg/dL (H)).   Medical History: Past Medical History:  Diagnosis Date  . Adenomatous polyps    -resected via colonoscopy in 2011  . Anemia   . Anxiety    MR  . Atrial fibrillation ([place])   . CHF (congestive heart failure) ([place])   . Chronic kidney disease   . Degenerative joint disease    status post right total hip arthroplasty  . Dysrhythmia   . GERD (gastroesophageal reflux disease)    -Barrett's esophagus  . Glaucoma   . Glaucoma   . Gout   . Hyperlipidemia   . Hypertension   . Mental retardation   . Peripheral neuropathy 09/12/2019  . Pneumonia   . Premature ventricular contractions   . Restless leg syndrome   . Shortness of breath dyspnea   . Wears dentures   . Wears glasses     Assessment: Pharmacy consulted to dose warfarin for this 79 yo female with atrial fibrillation on chronic anticoagulation with warfarin.   Home dose: 3mg  on Mon-Tues-Wed-Thurs-Fri only; nothing on Sat-Sun INR : 3.1>>2.6>> 2.8 CBC: H/H: 11.1/34.7     platelets 121K  Goal of Therapy:  INR 2-3 Monitor platelets by anticoagulation protocol: Yes   Plan:  No coumadin today Daily INR and CBC Monitor for signs/symptoms of bleeding.  Isac Sarna, BS Pharm D, [place] Clinical Pharmacist Pager [phone] 11/12/2019 10:03 AM

## 2019-11-12 NOTE — Progress Notes (Signed)
Pharmacy Antibiotic Note  [name] is a 79 y.o. female admitted on 11/07/2019 with staph aureus bacteremia.  Pharmacy has been consulted for Cefazolin dosing. 1/7 BCX are still positive. Repeat BCx 1/9  Are pending. Awaiting negative cultures x 48 hours for PICC line placement.   Plan: Continue Cefazolin 2000 mg IV every 8 hours. Monitor labs, c/s  Weight: 185 lb 6.5 oz (84.1 kg)  Temp (24hrs), Avg:97.7 F (36.5 C), Min:97.7 F (36.5 C), Max:97.7 F (36.5 C)  Recent Labs  Lab 11/07/19 1546 11/07/19 2210 11/08/19 0623 11/09/19 0616 11/10/19 0841 11/11/19 0639 11/12/19 0609  WBC 13.5*  --  13.4* 11.8* 10.7* 15.9* 12.2*  CREATININE 1.43*  --  1.23* 1.34* 1.49* 1.62* 1.51*  LATICACIDVEN 3.6* 2.1*  --   --   --   --   --     Estimated Creatinine Clearance: 35.8 mL/min (A) (by C-G formula based on SCr of 1.51 mg/dL (H)).    No Known Allergies  Antimicrobials this admission: Vanco 1/7 >> 1/8 Cefazolin 1/7 >>   Dose adjustments this admission: Vanco  Microbiology results: 1/5 Bcx: MSSA in 1/4 1/7 Bcx: MSSA in both aerobic and anaerobic bottles 1/9 BCX: pending 1/5 UCx: ng  1/5 SARS-2 CV is positive  Thank you for allowing pharmacy to be a part of this patient's care.  Isac Sarna, BS Pharm D, [place] Clinical Pharmacist Pager [phone] 11/12/2019 10:08 AM

## 2019-11-12 NOTE — Plan of Care (Signed)

## 2019-11-13 ENCOUNTER — Inpatient Hospital Stay (HOSPITAL_COMMUNITY): Payer: Medicare Other

## 2019-11-13 ENCOUNTER — Inpatient Hospital Stay: Payer: Self-pay

## 2019-11-13 LAB — CBC WITH DIFFERENTIAL/PLATELET
Abs Immature Granulocytes: 0.25 10*3/uL — ABNORMAL HIGH (ref 0.00–0.07)
Basophils Absolute: 0.1 10*3/uL (ref 0.0–0.1)
Basophils Relative: 1 %
Eosinophils Absolute: 0 10*3/uL (ref 0.0–0.5)
Eosinophils Relative: 0 %
HCT: 38.4 % (ref 36.0–46.0)
Hemoglobin: 12.4 g/dL (ref 12.0–15.0)
Immature Granulocytes: 2 %
Lymphocytes Relative: 4 %
Lymphs Abs: 0.4 10*3/uL — ABNORMAL LOW (ref 0.7–4.0)
MCH: 33.2 pg (ref 26.0–34.0)
MCHC: 32.3 g/dL (ref 30.0–36.0)
MCV: 102.7 fL — ABNORMAL HIGH (ref 80.0–100.0)
Monocytes Absolute: 0.3 10*3/uL (ref 0.1–1.0)
Monocytes Relative: 3 %
Neutro Abs: 9.5 10*3/uL — ABNORMAL HIGH (ref 1.7–7.7)
Neutrophils Relative %: 90 %
Platelets: 190 10*3/uL (ref 150–400)
RBC: 3.74 MIL/uL — ABNORMAL LOW (ref 3.87–5.11)
RDW: 15.4 % (ref 11.5–15.5)
WBC: 10.5 10*3/uL (ref 4.0–10.5)
nRBC: 0 % (ref 0.0–0.2)

## 2019-11-13 LAB — COMPREHENSIVE METABOLIC PANEL
ALT: 5 U/L (ref 0–44)
AST: 29 U/L (ref 15–41)
Albumin: 2.7 g/dL — ABNORMAL LOW (ref 3.5–5.0)
Alkaline Phosphatase: 67 U/L (ref 38–126)
Anion gap: 14 (ref 5–15)
BUN: 32 mg/dL — ABNORMAL HIGH (ref 8–23)
CO2: 34 mmol/L — ABNORMAL HIGH (ref 22–32)
Calcium: 8.9 mg/dL (ref 8.9–10.3)
Chloride: 90 mmol/L — ABNORMAL LOW (ref 98–111)
Creatinine, Ser: 1.51 mg/dL — ABNORMAL HIGH (ref 0.44–1.00)
GFR calc Af Amer: 38 mL/min — ABNORMAL LOW (ref >60)
GFR calc non Af Amer: 33 mL/min — ABNORMAL LOW (ref >60)
Glucose, Bld: 158 mg/dL — ABNORMAL HIGH (ref 70–99)
Potassium: 4 mmol/L (ref 3.5–5.1)
Sodium: 138 mmol/L (ref 135–145)
Total Bilirubin: 0.7 mg/dL (ref 0.3–1.2)
Total Protein: 6.1 g/dL — ABNORMAL LOW (ref 6.5–8.1)

## 2019-11-13 LAB — CULTURE, BLOOD (ROUTINE X 2)
Culture: NO GROWTH
Special Requests: ADEQUATE

## 2019-11-13 LAB — D-DIMER, QUANTITATIVE: D-Dimer, Quant: 0.65 ug/mL-FEU — ABNORMAL HIGH (ref 0.00–0.50)

## 2019-11-13 LAB — MAGNESIUM: Magnesium: 1.7 mg/dL (ref 1.7–2.4)

## 2019-11-13 LAB — FERRITIN: Ferritin: 90 ng/mL (ref 11–307)

## 2019-11-13 LAB — PROTIME-INR
INR: 3 — ABNORMAL HIGH (ref 0.8–1.2)
Prothrombin Time: 31.1 seconds — ABNORMAL HIGH (ref 11.4–15.2)

## 2019-11-13 LAB — C-REACTIVE PROTEIN: CRP: 6.3 mg/dL — ABNORMAL HIGH (ref <1.0)

## 2019-11-13 MED ORDER — SODIUM CHLORIDE 0.9% FLUSH: 10.0000 mL | INTRAVENOUS | Status: DC | PRN

## 2019-11-13 MED ORDER — METOPROLOL SUCCINATE ER 25 MG PO TB24
25.0000 mg | ORAL_TABLET | Freq: Every day | ORAL | Status: AC
  Administered 2019-11-13: 25 mg via ORAL
  Filled 2019-11-13: qty 1

## 2019-11-13 MED ORDER — SODIUM CHLORIDE 0.9% FLUSH
10.0000 mL | Freq: Two times a day (BID) | INTRAVENOUS | Status: DC
  Administered 2019-11-13: 21:00:00 20 mL
  Administered 2019-11-14 – 2019-11-18 (×6): 10 mL

## 2019-11-13 MED ORDER — CHLORHEXIDINE GLUCONATE CLOTH 2 % EX PADS
6.0000 | MEDICATED_PAD | Freq: Every day | CUTANEOUS | Status: DC
  Administered 2019-11-13 – 2019-11-18 (×6): 6 via TOPICAL

## 2019-11-13 MED ORDER — POTASSIUM CHLORIDE CRYS ER 20 MEQ PO TBCR
40.0000 meq | EXTENDED_RELEASE_TABLET | Freq: Two times a day (BID) | ORAL | Status: DC
  Administered 2019-11-13 – 2019-11-15 (×5): 40 meq via ORAL
  Filled 2019-11-13 (×6): qty 2

## 2019-11-13 MED ORDER — METOPROLOL SUCCINATE ER 50 MG PO TB24
75.0000 mg | ORAL_TABLET | Freq: Every day | ORAL | Status: DC
  Filled 2019-11-13: qty 1

## 2019-11-13 NOTE — Care Management Important Message (Signed)
Important Message  Patient Details  Name: [name] MRN: [id number] Date of Birth: 17-Nov-1940   Medicare Important Message Given:  Yes(Patricia, RN will deliver letter to patient due to contact precautions)     Tommy Medal 11/13/2019, 1:24 PM

## 2019-11-13 NOTE — Progress Notes (Addendum)
PHARMACY CONSULT NOTE FOR:  OUTPATIENT  PARENTERAL ANTIBIOTIC THERAPY (OPAT)  Indication: MSSA Bacteremia Regimen: Cefazolin 2gm IV q8h (renal function should be monitored- borderline q12h) End date: Last day 12/06/2019  IV antibiotic discharge orders are pended. To discharging provider:  please sign these orders via discharge navigator,  Select New Orders & click on the button choice - Manage This Unsigned Work.     Thank you for allowing pharmacy to be a part of this patient's care.  Margot Ables, PharmD Clinical Pharmacist 11/14/2019 12:46 PM

## 2019-11-13 NOTE — Progress Notes (Signed)
Patient ID: [name], female   DOB: August 05, 1941, 79 y.o.   MRN: [id number]         [facility]    Date of Admission:  11/07/2019   Day 5 cefazolin         [name] had transient MSSA bacteremia in the setting of acute COVID-19 pneumonia.  There is no evidence of endocarditis by reported exam or TTE.  I agree with not pursuing TEE now because of her COVID-19 infection.  Repeat blood cultures are negative at 48 hours so it is okay to go ahead with PICC placement.  I favor foregoing TEE and simply treating her for 4 weeks through 11/30/2019.  I will sign off now but please call if I can be of further assistance.  Diagnosis: Bacteremia  Culture Result: MSSA  No Known Allergies  OPAT Orders Discharge antibiotics: Per pharmacy protocol cefazolin  Duration: 4 weeks End Date: 11/30/2019  [facility] Care Per Protocol:  Labs weekly while on IV antibiotics: _x_ CBC with differential _x_ BMP __ CMP __ CRP __ ESR __ Vancomycin trough __ CK  _x_ Please pull PIC at completion of IV antibiotics __ Please leave PIC in place until doctor has seen patient or been notified  Fax weekly labs to [phone]         Michel Bickers, [place] for [place] [phone] pager   [phone] cell 11/13/2019, 10:08 AM

## 2019-11-13 NOTE — Plan of Care (Signed)

## 2019-11-13 NOTE — Progress Notes (Signed)
Dr Kyla Balzarine rounding note reviewed, primary team rounding notes reviewed. From cardiac standpoint being managed for acute systolic HF LVEF 67-70% in setting of COVID pneumonia Medical therapy with toprol 50mg  daily, aldactone 12.5mg  daily. With renal function would avoid ARB/ACE/ARNI at this time. Baseline Cr looks to be around 1.4. currently at 1.5. On oral torsemide 20mg  bid, felt to be euvolemic by last cardiology note. Afib rates mildly elevated. Increase toprol to 75mg  daily.    Zandra Abts MD

## 2019-11-13 NOTE — Progress Notes (Signed)
ANTICOAGULATION CONSULT NOTE   Pharmacy Consult for: warfarin dosing Indication: atrial fibrillation  No Known Allergies  Patient Measurements: Height: 5\' 4"  (162.6 cm) Weight: 178 lb 12.7 oz (81.1 kg) IBW/kg (Calculated) : 54.7 Heparin Dosing Weight:      Vital Signs: Temp: 97.8 F (36.6 C) (01/11 0517) Temp Source: Oral (01/11 0517) BP: 121/65 (01/11 0517) Pulse Rate: 120 (01/11 0517)  Labs: Recent Labs    11/11/19 0639 11/12/19 0609 11/13/19 0529  HGB 12.7 11.7* 12.4  HCT 39.3 36.2 38.4  PLT 134* 147* 190  LABPROT 28.1* 29.3* 31.1*  INR 2.6* 2.8* 3.0*  CREATININE 1.62* 1.51* 1.51*    Estimated Creatinine Clearance: 32.2 mL/min (A) (by C-G formula based on SCr of 1.51 mg/dL (H)).   Medical History: Past Medical History:  Diagnosis Date  . Adenomatous polyps    -resected via colonoscopy in 2011  . Anemia   . Anxiety    MR  . Atrial fibrillation ([place])   . CHF (congestive heart failure) ([place])   . Chronic kidney disease   . Degenerative joint disease    status post right total hip arthroplasty  . Dysrhythmia   . GERD (gastroesophageal reflux disease)    -Barrett's esophagus  . Glaucoma   . Glaucoma   . Gout   . Hyperlipidemia   . Hypertension   . Mental retardation   . Peripheral neuropathy 09/12/2019  . Pneumonia   . Premature ventricular contractions   . Restless leg syndrome   . Shortness of breath dyspnea   . Wears dentures   . Wears glasses     Assessment: Pharmacy consulted to dose warfarin for this 79 yo female with atrial fibrillation on chronic anticoagulation with warfarin.   Home dose: 3mg  on Mon-Tues-Wed-Thurs-Fri only; nothing on Sat-Sun INR : 3.1>>2.6>> 2.8>>3 and no coumadin given yesterday. Will wait 1 more day, since INR went up, and then restart coumadin. CBC: H/H: 11.1/34.7     platelets 121K  Goal of Therapy:  INR 2-3 Monitor platelets by anticoagulation protocol: Yes   Plan:  No coumadin today Daily INR and  CBC Monitor for signs/symptoms of bleeding.  Isac Sarna, BS [place], [place] Clinical Pharmacist Pager [phone] 11/13/2019 10:46 AM

## 2019-11-13 NOTE — Progress Notes (Signed)
PROGRESS NOTE    [name]  SKA:[id number] DOB: 01-17-1941 DOA: 11/07/2019 PCP: Doree Albee, MD    Brief Narrative:  79 y.o. female with medical history significant for chronic hypoxic respiratory failure in the setting of chronic diastolic heart failure on 3 L continuous supplemental oxygen, paroxysmal atrial fibrillation chronically anticoagulated on warfarin, hypertension, stage III chronic kidney disease with baseline creatinine of 1.4-1.5, developmental delay, who is admitted to [facility] on 11/07/2019 with COVID-19 pneumonia after resenting from home to [facility] emergency department for evaluation of objective fever.   In the setting of history of developmental delay associated with baseline mental status, the following history is obtained via my discussions with the emergency department physician as well as via chart review.  Per the patient's niece, with whom the patient lives, multiple family members to whom the patient has been exposed over the last week have been experiencing new onset rhinitis, rhinorrhea, sore throat, shortness of breath and subjective fever, although niece reports that none of these family members have been tested for Covid at this time.  Subsequent to the above symptoms experienced by patient's family members, the patient reportedly developed an objective fever over the last day, with temperature max at home noted to be 102-103 F.  [name] reportedly exhibited some dry heaving over that time in the absence of any diarrhea or rash.  Per niece, no increase in patient's supplemental oxygen demands relative to her baseline requirement of 3 L continuous nasal cannula.  Patient has undergone no recent traveling.  Past medical history is notable for a history of chronic diastolic heart failure.  Most recent echocardiogram was performed in June 2019 and showed left ventricle associated with normal cavity size and normal wall thickness, LVEF 55 to 60%, no  focal wall motion abnormalities, but showed evidence of diastolic dysfunction as well as mild mitral regurgitation and moderate tricuspid regurgitation.  Outpatient diuretic regimen consists of torsemide 20 mg p.o. twice daily.  While on this diuretic, the patient is also prescribed potassium chloride 20 mEq p.o. twice daily.  However, the patient's niece reports that the patient ran out of her potassium supplementation approximately 1 week ago, and has been unable to subsequently fill this prescription.    Assessment & Plan: 1-sepsis secondary to pneumonia due to COVID-19 virus and a Staph aureus (MSSA) bacteremia -Patient reports feeling short of breath, using 3.5 L nasal supplementation with (at home chronically on 2.5-3 L) -Chest x-ray demonstrating bilateral infiltrates -On presentation patient met sepsis criteria; with elevated respiratory rate, elevated heart rate, low-grade temperature, elevated WBCs and also elevated lactic acid. -Patient has completed remdesivir therapy and will continue steroids as per protocol.  -Follow inflammatory markers and clinical response. -As needed antipyretics and bronchodilators. -Speciation demonstrating MSSA bacteremia; following ID recommendations patient transitioned to IV cefazolin only.  Will follow infectious disease recommendation for length of treatment (toal of 4 weeks). -2D echo suboptimal, but no demonstrating vegetations. -Repeat blood cultures returned positive; continue IV antibiotics and repeat cultures.  Patient needs 48 hours with negative cultures before PICC line can be place. -We will place PICC line. -Staph aureus species resistant to erythromycin and clindamycin.   2-paroxysmal atrial fibrillation (HCC) -Chronic -CHADsVASC score 5 -Continue B-blocker for rate control and use of warfarin for secondary prevention; pharmacy dosing.  3-Chronic anticoagulation -Continue warfarin for anticoagulation as per pharmacy dosing. -Follow  INR. -No signs of overt bleeding.  4-Chronic kidney disease, stage 3 -Appears overall stable and at baseline  currently. -Per GFR evaluation patient with a stage IIIb chronic kidney disease. -Continue to follow renal function trend. -Pharmacy adjusting medications for renal function.  5-acute systolic heart failure  -Unable to assess diastolic function with atrial fibrillation at this time. -Patient with previous history of diastolic heart failure documentation and normal ejection fraction in 2017.   -Cardiology service has been consulted to assist with medication adjustment/optimization. -Continue to follow daily weights and low-sodium diet -Continue home dose torsemide. -Currently euvolemic and stable. -Per cardiology recommendations previews BID lopressor has been transitioned to extended release Toprol and patient has been started on spironolactone. -No ACE inhibitors, Entresto or ARB at this moment given chronic renal failure and slight fluctuation of Cr.  -Further treatment and medication adjustment to be decided by cardiologist at follow-up visit.  6-hypokalemia and hypomagnesemia -Magnesium repleted and within normal limits currently -After repletion and supplementation potassium is 4.0. -Follow electrolytes trend. -Following cardiology recommendations patient has been started on spironolactone for CHF, which would also help on potassium stability.  7-chronic respiratory failure -Continue oxygen supplementation -Follow O2 sats and clinical response; wean oxygen supplementation down to baseline as tolerated. -Patient uses 2.5-3 L oxygen supplementation chronically; currently using 3 L. -Continue as needed bronchodilators.  8-hypothyroidism -Continue Synthroid.  DVT prophylaxis: Continue warfarin Code Status: Full Code Family Communication: No family at bedside; niece updated over the phone. Disposition Plan: Remains inpatient, continue IV antibiotic (cefazolin), plan is to  treat for a total of 4 weeks (currently 23 days pending); blood culture from 11/11/2019 - for 48 hours will place PICC line.  Patient has completed remdesivir infusion on 11/12/2019.  Continue steroids with anticipated tapering at discharge.  Hopefully home in the next 24-48 hours.   Consultants:   ID (Dr. Megan Salon) with automatic counsultation secondary to positive for staph bacteremia  Cardiology service (secondary to acute systolic heart failure).  Procedures:   See below for x-ray reports  Antimicrobials:  Anti-infectives (From admission, onward)   Start     Dose/Rate Route Frequency Ordered Stop   11/10/19 1600  vancomycin (VANCOREADY) IVPB 1250 mg/250 mL  Status:  Discontinued     1,250 mg 166.7 mL/hr over 90 Minutes Intravenous Every 24 hours 11/09/19 1414 11/10/19 0949   11/09/19 1200  ceFAZolin (ANCEF) IVPB 2g/100 mL premix     2 g 200 mL/hr over 30 Minutes Intravenous Every 8 hours 11/09/19 1016     11/09/19 1000  remdesivir 100 mg in sodium chloride 0.9 % 100 mL IVPB     100 mg 200 mL/hr over 30 Minutes Intravenous Daily 11/08/19 0752 11/12/19 1005   11/09/19 1000  vancomycin (VANCOREADY) IVPB 1750 mg/350 mL  Status:  Discontinued     1,750 mg 175 mL/hr over 120 Minutes Intravenous  Once 11/09/19 0939 11/09/19 0940   11/09/19 1000  vancomycin (VANCOREADY) IVPB 2000 mg/400 mL     2,000 mg 200 mL/hr over 120 Minutes Intravenous  Once 11/09/19 0940 11/09/19 1703   11/08/19 0800  remdesivir 200 mg in sodium chloride 0.9% 250 mL IVPB     200 mg 580 mL/hr over 30 Minutes Intravenous Once 11/08/19 0752 11/08/19 1256       Subjective: Anxious to go home, no fever, no chest pain, no nausea, no vomiting, reports breathing is stable.  Objective: Vitals:   11/12/19 1913 11/12/19 2046 11/13/19 0500 11/13/19 0517  BP:  111/78  121/65  Pulse:  (!) 102  (!) 120  Resp:  20  (!) 21  Temp:  Marland Kitchen)  97.3 F (36.3 C)  97.8 F (36.6 C)  TempSrc:  Oral  Oral  SpO2:  93%  91%  Weight:    81.1 kg   Height: 5' 4"  (1.626 m)       Intake/Output Summary (Last 24 hours) at 11/13/2019 1331 Last data filed at 11/13/2019 0900 Gross per 24 hour  Intake 1012.6 ml  Output --  Net 1012.6 ml   Filed Weights   11/11/19 0634 11/12/19 0500 11/13/19 0500  Weight: 84.1 kg 84.1 kg 81.1 kg    Examination: General exam: Alert, awake, oriented x 2; in no acute distress.  Afebrile with a stable breathing. Respiratory system: Scattered rhonchi are appreciated on exam; no wheezing, decreased breath sounds at the base without frank crackles and no using accessory muscles.  Good O2 sat on her chronic 3 L nasal cannula supplementation.   Cardiovascular system: Irregular irregular; no rubs, no gallops, no JVD on exam.   Gastrointestinal system: Abdomen is nondistended, soft and nontender. No organomegaly or masses felt. Normal bowel sounds heard. Central nervous system: Alert and oriented. No focal neurological deficits. Extremities: No cyanosis or clubbing, no edema. Skin: No rashes, no petechiae. Psychiatry: Mood & affect appropriate.  No hallucinations.  Patient is anxious to go home.   Data Reviewed: I have personally reviewed following labs and imaging studies  CBC: Recent Labs  Lab 11/09/19 0616 11/10/19 0841 11/11/19 0639 11/12/19 0609 11/13/19 0529  WBC 11.8* 10.7* 15.9* 12.2* 10.5  NEUTROABS 10.9* 9.8* 14.7* 11.2* 9.5*  HGB 10.4* 11.1* 12.7 11.7* 12.4  HCT 31.8* 34.7* 39.3 36.2 38.4  MCV 102.6* 103.3* 104.0* 102.0* 102.7*  PLT 107* 121* 134* 147* 993   Basic Metabolic Panel: Recent Labs  Lab 11/09/19 0616 11/10/19 0841 11/11/19 0639 11/12/19 0609 11/13/19 0529  NA 136 135 136 137 138  K 2.5* 2.5* 3.1* 3.9 4.0  CL 91* 89* 88* 89* 90*  CO2 31 33* 35* 33* 34*  GLUCOSE 127* 142* 161* 125* 158*  BUN 26* 28* 29* 31* 32*  CREATININE 1.34* 1.49* 1.62* 1.51* 1.51*  CALCIUM 8.3* 8.3* 8.9 8.8* 8.9  MG 1.5* 1.8 1.7 1.7 1.7   GFR: Estimated Creatinine Clearance: 32.2  mL/min (A) (by C-G formula based on SCr of 1.51 mg/dL (H)).   Liver Function Tests: Recent Labs  Lab 11/09/19 0616 11/10/19 0841 11/11/19 0639 11/12/19 0609 11/13/19 0529  AST 38 32 24 25 29   ALT 17 17 9 7 5   ALKPHOS 48 52 60 58 67  BILITOT 0.9 0.6 0.7 0.5 0.7  PROT 5.8* 6.0* 6.6 6.0* 6.1*  ALBUMIN 2.7* 2.6* 2.8* 2.5* 2.7*   Recent Labs  Lab 11/07/19 1546  LIPASE 15   Coagulation Profile: Recent Labs  Lab 11/09/19 0616 11/10/19 0841 11/11/19 0639 11/12/19 0609 11/13/19 0529  INR 2.1* 3.1* 2.6* 2.8* 3.0*   Anemia Panel: Recent Labs    11/12/19 0609 11/13/19 0529  FERRITIN 82 90   Urine analysis:    Component Value Date/Time   COLORURINE YELLOW 11/07/2019 [place] 11/07/2019 1454   LABSPEC 1.010 11/07/2019 1454   PHURINE 5.0 11/07/2019 1454   GLUCOSEU NEGATIVE 11/07/2019 1454   HGBUR SMALL (A) 11/07/2019 1454   BILIRUBINUR NEGATIVE 11/07/2019 1454   KETONESUR NEGATIVE 11/07/2019 1454   PROTEINUR NEGATIVE 11/07/2019 1454   NITRITE NEGATIVE 11/07/2019 1454   LEUKOCYTESUR NEGATIVE 11/07/2019 1454    Recent Results (from the past 240 hour(s))  Urine culture     Status: None  Collection Time: 11/07/19  2:54 PM   Specimen: Urine, Clean Catch  Result Value Ref Range Status   Specimen Description   Final    URINE, CLEAN CATCH Performed at [facility], [address], [place], [place] [address]    Special Requests   Final    NONE Performed at [facility], [address] [place], [place] [address]    Culture   Final    NO GROWTH Performed at [facility], [place] [address], [place], [place] [address]    Report Status 11/08/2019 FINAL  Final  Culture, blood (routine x 2)     Status: None   Collection Time: 11/07/19  3:46 PM   Specimen: BLOOD RIGHT HAND  Result Value Ref Range Status   Specimen Description BLOOD RIGHT HAND  Final   Special Requests   Final    BOTTLES DRAWN AEROBIC ONLY Blood Culture adequate volume   Culture    Final    NO GROWTH 6 DAYS Performed at [facility], [address] [place], [place] [address]    Report Status 11/13/2019 FINAL  Final  Culture, blood (routine x 2)     Status: Abnormal   Collection Time: 11/07/19 10:10 PM   Specimen: BLOOD RIGHT HAND  Result Value Ref Range Status   Specimen Description   Final    BLOOD RIGHT HAND Performed at [facility], [address] [place], [place] [address]    Special Requests   Final    BOTTLES DRAWN AEROBIC AND ANAEROBIC Blood Culture adequate volume Performed at [facility], [address], [place], [place] [address]    Culture  Setup Time   Final    GRAM POSITIVE COCCI AEROBIC BOTTLE Gram Stain Report Called to,Read Back By and Verified With: BENGTSON, P ON 11/08/19 AT 1750 BY LOY,C  PERFORMED AT [facility] Performed at [facility], [address] [place], [place] [address]    Culture STAPHYLOCOCCUS AUREUS (A)  Final   Report Status 11/10/2019 FINAL  Final   Organism ID, Bacteria STAPHYLOCOCCUS AUREUS  Final      Susceptibility   Staphylococcus aureus - MIC*    CIPROFLOXACIN <=0.5 SENSITIVE Sensitive     ERYTHROMYCIN >=8 RESISTANT Resistant     GENTAMICIN <=0.5 SENSITIVE Sensitive     OXACILLIN <=0.25 SENSITIVE Sensitive     TETRACYCLINE <=1 SENSITIVE Sensitive     VANCOMYCIN <=0.5 SENSITIVE Sensitive     TRIMETH/SULFA <=10 SENSITIVE Sensitive     CLINDAMYCIN RESISTANT Resistant     RIFAMPIN <=0.5 SENSITIVE Sensitive     Inducible Clindamycin POSITIVE Resistant     * STAPHYLOCOCCUS AUREUS  Culture, blood (routine x 2)     Status: Abnormal   Collection Time: 11/09/19 12:30 PM   Specimen: BLOOD RIGHT WRIST  Result Value Ref Range Status   Specimen Description   Final    BLOOD RIGHT WRIST Performed at [facility], [address], [place], [place] [address]    Special Requests   Final    BOTTLES DRAWN AEROBIC AND ANAEROBIC Blood Culture adequate volume Performed at [facility], [address] [place], [place] [address]    Culture   Setup Time   Final    GRAM POSITIVE COCCI IN BOTH AEROBIC AND ANAEROBIC BOTTLES Gram Stain Report Called to,Read Back By and Verified With: DEAN T. AT [id number] ON [id number] BY THOMPSON S. Performed at [facility], [address] [place], [place] [address]    Culture (A)  Final  STAPHYLOCOCCUS AUREUS SUSCEPTIBILITIES PERFORMED ON PREVIOUS CULTURE WITHIN THE LAST 5 DAYS. Performed at [facility], [place] [address], [place], [place] [address]    Report Status 11/12/2019 FINAL  Final  Culture, blood (routine x 2)     Status: None (Preliminary result)   Collection Time: 11/09/19 12:38 PM   Specimen: BLOOD RIGHT HAND  Result Value Ref Range Status   Specimen Description BLOOD RIGHT HAND  Final   Special Requests   Final    BOTTLES DRAWN AEROBIC AND ANAEROBIC Blood Culture adequate volume   Culture   Final    NO GROWTH 4 DAYS Performed at [facility], [address], [place], [place] [address]    Report Status PENDING  Incomplete  Culture, blood (routine x 2)     Status: None (Preliminary result)   Collection Time: 11/11/19 11:18 AM   Specimen: Right Antecubital; Blood  Result Value Ref Range Status   Specimen Description   Final    RIGHT ANTECUBITAL BOTTLES DRAWN AEROBIC AND ANAEROBIC   Special Requests Blood Culture adequate volume  Final   Culture   Final    NO GROWTH 2 DAYS Performed at [facility], [address] [place], [place] [address]    Report Status PENDING  Incomplete  Culture, blood (routine x 2)     Status: None (Preliminary result)   Collection Time: 11/11/19 11:27 AM   Specimen: BLOOD RIGHT ARM  Result Value Ref Range Status   Specimen Description   Final    BLOOD RIGHT ARM BOTTLES DRAWN AEROBIC AND ANAEROBIC   Special Requests Blood Culture adequate volume  Final   Culture   Final    NO GROWTH 2 DAYS Performed at [facility], [address] [place], [place] [address]    Report Status PENDING  Incomplete     Radiology Studies: No results  found.  Scheduled Meds: . vitamin C  500 mg Oral Daily  . latanoprost  1 drop Both Eyes QHS  . methylPREDNISolone (SOLU-MEDROL) injection  0.5 mg/kg Intravenous Q12H  . [START ON 11/14/2019] metoprolol succinate  75 mg Oral Daily  . potassium chloride SA  40 mEq Oral TID  . saccharomyces boulardii  250 mg Oral BID  . sodium chloride flush  3 mL Intravenous Q12H  . spironolactone  12.5 mg Oral Daily  . torsemide  20 mg Oral BID  . Warfarin - Pharmacist Dosing Inpatient   Does not apply q1800  . zinc sulfate  220 mg Oral Daily   Continuous Infusions: .  ceFAZolin (ANCEF) IV 2 g (11/13/19 0541)     LOS: 6 days    Time spent: 35 minutes.   Barton Dubois, MD [facility] Pager [phone]   11/13/2019, 1:31 PM

## 2019-11-13 NOTE — Progress Notes (Signed)
Peripherally Inserted Central Catheter/Midline Placement  The IV Nurse has discussed with the patient and/or persons authorized to consent for the patient, the purpose of this procedure and the potential benefits and risks involved with this procedure.  The benefits include less needle sticks, lab draws from the catheter, and the patient may be discharged home with the catheter. Risks include, but not limited to, infection, bleeding, blood clot (thrombus formation), and puncture of an artery; nerve damage and irregular heartbeat and possibility to perform a PICC exchange if needed/ordered by physician.  Alternatives to this procedure were also discussed.  Bard Power PICC patient education guide, fact sheet on infection prevention and patient information card has been provided to patient /or left at bedside.    PICC/Midline Placement Documentation  PICC Single Lumen 16/24/46 PICC Right Basilic 38 cm 2 cm (Active)  Indication for Insertion or Continuance of Line Home intravenous therapies (PICC only) 11/13/19 1700  Exposed Catheter (cm) 2 cm 11/13/19 1700  Site Assessment Clean;Dry;Intact 11/13/19 1700  Line Status Flushed;Blood return noted;Saline locked 11/13/19 1700  Dressing Type Transparent;Securing device 11/13/19 1700  Dressing Status Clean;Dry;Intact;Antimicrobial disc in place 11/13/19 [place] checked and tightened 11/13/19 1700  Dressing Intervention New dressing 11/13/19 1700  Dressing Change Due 11/20/19 11/13/19 1700     Sister [name] gave verbal consent via telephone witnessed by 2 RNs  Virgilio Belling 11/13/2019, 5:04 PM

## 2019-11-14 ENCOUNTER — Ambulatory Visit (INDEPENDENT_AMBULATORY_CARE_PROVIDER_SITE_OTHER): Payer: Medicare Other | Admitting: Internal Medicine

## 2019-11-14 LAB — PROTIME-INR
INR: 2.8 — ABNORMAL HIGH (ref 0.8–1.2)
Prothrombin Time: 29.7 seconds — ABNORMAL HIGH (ref 11.4–15.2)

## 2019-11-14 MED ORDER — ALUM & MAG HYDROXIDE-SIMETH 200-200-20 MG/5ML PO SUSP
30.0000 mL | ORAL | Status: DC | PRN
  Administered 2019-11-14: 30 mL via ORAL
  Filled 2019-11-14: qty 30

## 2019-11-14 MED ORDER — WARFARIN SODIUM 1 MG PO TABS
2.0000 mg | ORAL_TABLET | Freq: Once | ORAL | Status: AC
  Administered 2019-11-14: 2 mg via ORAL
  Filled 2019-11-14: qty 2

## 2019-11-14 MED ORDER — METOPROLOL SUCCINATE ER 50 MG PO TB24
125.0000 mg | ORAL_TABLET | Freq: Every day | ORAL | Status: DC
  Administered 2019-11-14: 125 mg via ORAL
  Filled 2019-11-14 (×4): qty 1

## 2019-11-14 NOTE — Progress Notes (Signed)
Pt up in chair by PT. Refusing to eat breakfast, is drinking water and gingerale. States has "horrible" headache, "worst ever!". Tylenol po given for headache. SaO2 89-90% on 3lpm [place], resp rate 25/min. Oxygen increased to 4lpm [place], had pt to do cough and deep breathing.

## 2019-11-14 NOTE — Telephone Encounter (Signed)
Order was call back to [place].

## 2019-11-14 NOTE — Progress Notes (Signed)
NT and myself assisting pt to straighten up in chair, noted that pt has dark purple bruising to left lower/lateral breast area and left lateral rib area. Pt denies any c/o pain at site. States doesn't know how or when bruising occurred. Skin intact.

## 2019-11-14 NOTE — Progress Notes (Signed)
Pt has refused all food today, has taken all po meds and is drinking water and gingerale. Currently c/o "sick to my stomach" and heaving. Zofran 4mg  IV given per order for nausea. Continues up in chair per her request, have made several offers to help her back to bed, pt states, "I'm gonna stay right here."' Bedside table with pt's activity books, drinks and crackers over pt's lap, all items within easy reach. Pt has had multiple episodes of banging on side table, yelling, throwing things in floor. When asked why she is doing these things, she says, "I don't know." Have adjusted pt's TV 6 times since 0800, retrieved call bell out of floor x3 where pt has thrown it down, adjusted room temperature 4 times in attempts to satisfy her needs.

## 2019-11-14 NOTE — TOC Initial Note (Addendum)
Transition of Care [facility]) - Initial/Assessment Note    Patient Details  Name: [name] MRN: [id number] Date of Birth: 08/09/1941  Transition of Care [facility]) CM/SW Contact:    Ihor Gully, LCSW Phone Number: 11/14/2019, 6:50 PM  Clinical Narrative:                 Patient from home with relatives. Admitted, COVID+. She is on home oxygen. She has a walker and wheelchair in the home.  She uses both the walker and wheelchair.  Patient reports that she completes her ADLs independently. PT evaluation and recommendations discussed. Patient declines SNF, indicating that she has been to one in the past and does not wish to go back. Patient is accepting of HHPT.  HHPT choices provided from [facility] website.  [name] with [facility] provided referral for IV abx.    Expected Discharge Plan: [place] Barriers to Discharge: Continued Medical Work up   Patient Goals and CMS Choice        Expected Discharge Plan and Services Expected Discharge Plan: [place]       Living arrangements for the past 2 months: Single Family Home                                      Prior Living Arrangements/Services Living arrangements for the past 2 months: Single Family Home Lives with:: Relatives Patient language and need for interpreter reviewed:: Yes Do you feel safe going back to the place where you live?: Yes      Need for Family Participation in Patient Care: Yes (Comment) Care giver support system in place?: Yes (comment) Current home services: DME(oxygen, walker, wheelchair) Criminal Activity/Legal Involvement Pertinent to Current Situation/Hospitalization: No - Comment as needed  Activities of Daily Living Home Assistive Devices/Equipment: Eyeglasses, Wheelchair ADL Screening (condition at time of admission) Patient's cognitive ability adequate to safely complete daily activities?: No Is the patient deaf or have difficulty hearing?:  Yes Does the patient have difficulty seeing, even when wearing glasses/contacts?: Yes Does the patient have difficulty concentrating, remembering, or making decisions?: Yes Patient able to express need for assistance with ADLs?: Yes Does the patient have difficulty dressing or bathing?: Yes Independently performs ADLs?: No Does the patient have difficulty walking or climbing stairs?: Yes Weakness of Legs: Both Weakness of Arms/Hands: None  Permission Sought/Granted                  Emotional Assessment Appearance:: Appears stated age   Affect (typically observed): Appropriate Orientation: : Oriented to Self, Oriented to Place, Oriented to  Time, Oriented to Situation Alcohol / Substance Use: Not Applicable Psych Involvement: No (comment)  Admission diagnosis:  Pneumonia due to COVID-19 virus [U07.1, J12.82] Patient Active Problem List   Diagnosis Date Noted  . Bacteremia due to methicillin susceptible Staphylococcus aureus (MSSA) 11/09/2019  . Pneumonia due to COVID-19 virus 11/07/2019  . Peripheral neuropathy 09/12/2019  . Absolute anemia 02/14/2019  . Barrett's esophagus without dysplasia 02/14/2019  . CHF, acute on chronic ([place]) 04/11/2018  . Atrial fibrillation with RVR ([place]) 04/11/2018  . Electrolyte depletion 09/18/2017  . Elevated alkaline phosphatase level 09/18/2017  . Acute renal failure superimposed on chronic kidney disease ([place])   . Elevated LFTs   . Hypomagnesemia   . Intellectual disability   . Hypokalemia 09/11/2017  . Hyponatremia 09/11/2017  . Acute renal  injury ([place]) 09/11/2017  . Dehydration 09/11/2017  . Supratherapeutic INR 09/11/2017  . Hyperbilirubinemia 09/11/2017  . Elevated troponin 09/11/2017  . Vulvovaginitis due to yeast 09/11/2017  . Syncope 12/26/2015  . Chronic anticoagulation 12/20/2012  . Chronic kidney disease, stage 3 12/20/2012  . Anemia, normocytic normochromic 12/20/2012  . History of diagnostic tests 12/20/2012  . Acute  on chronic diastolic CHF (congestive heart failure) ([place]) 11/25/2011  . Atrial fibrillation ([place])   . Degenerative joint disease   . GERD (gastroesophageal reflux disease)   . Gout   . Adenomatous polyps   . GLAUCOMA 06/25/2009   PCP:  Doree Albee, MD Pharmacy:   [place], [place] [address] [place] [address] Phone: [phone] Fax: [phone]  [place], [place] [address] [place] [address] [place] [address] Phone: [phone] Fax: [place] [address], [place] [address] [place] [address] [place] [address] Phone: [phone] Fax: [phone]     Social Determinants of Health (SDOH) Interventions    Readmission Risk Interventions No flowsheet data found.

## 2019-11-14 NOTE — Plan of Care (Signed)
Pt has refused both meals today and refuses any type of food/snack offered. Tolerating po liquids without diff.  Problem: Education: Goal: Knowledge of General Education information will improve Description: Including pain rating scale, medication(s)/side effects and non-pharmacologic comfort measures Outcome: Progressing   Problem: Health Behavior/Discharge Planning: Goal: Ability to manage health-related needs will improve Outcome: Progressing   Problem: Clinical Measurements: Goal: Ability to maintain clinical measurements within normal limits will improve Outcome: Progressing Goal: Will remain free from infection Outcome: Progressing Goal: Diagnostic test results will improve Outcome: Progressing Goal: Respiratory complications will improve Outcome: Progressing Goal: Cardiovascular complication will be avoided Outcome: Progressing   Problem: Activity: Goal: Risk for activity intolerance will decrease Outcome: Progressing   Problem: Coping: Goal: Level of anxiety will decrease Outcome: Progressing   Problem: Elimination: Goal: Will not experience complications related to bowel motility Outcome: Progressing Goal: Will not experience complications related to urinary retention Outcome: Progressing   Problem: Pain Managment: Goal: General experience of comfort will improve Outcome: Progressing   Problem: Safety: Goal: Ability to remain free from injury will improve Outcome: Progressing   Problem: Skin Integrity: Goal: Risk for impaired skin integrity will decrease Outcome: Progressing

## 2019-11-14 NOTE — Progress Notes (Signed)
ANTICOAGULATION CONSULT NOTE   Pharmacy Consult for: warfarin dosing Indication: atrial fibrillation  No Known Allergies  Patient Measurements: Height: 5\' 4"  (162.6 cm) Weight: 182 lb 1.6 oz (82.6 kg) IBW/kg (Calculated) : 54.7 Heparin Dosing Weight:      Vital Signs: Temp: 97.6 F (36.4 C) (01/11 2148) Temp Source: Oral (01/11 2148) BP: 146/95 (01/12 8616) Pulse Rate: 113 (01/12 0638)  Labs: Recent Labs    11/12/19 0609 11/13/19 0529 11/14/19 0530  HGB 11.7* 12.4  --   HCT 36.2 38.4  --   PLT 147* 190  --   LABPROT 29.3* 31.1* 29.7*  INR 2.8* 3.0* 2.8*  CREATININE 1.51* 1.51*  --     Estimated Creatinine Clearance: 32.5 mL/min (A) (by C-G formula based on SCr of 1.51 mg/dL (H)).   Medical History: Past Medical History:  Diagnosis Date  . Adenomatous polyps    -resected via colonoscopy in 2011  . Anemia   . Anxiety    MR  . Atrial fibrillation ([place])   . CHF (congestive heart failure) ([place])   . Chronic kidney disease   . Degenerative joint disease    status post right total hip arthroplasty  . Dysrhythmia   . GERD (gastroesophageal reflux disease)    -Barrett's esophagus  . Glaucoma   . Glaucoma   . Gout   . Hyperlipidemia   . Hypertension   . Mental retardation   . Peripheral neuropathy 09/12/2019  . Pneumonia   . Premature ventricular contractions   . Restless leg syndrome   . Shortness of breath dyspnea   . Wears dentures   . Wears glasses     Assessment: Pharmacy consulted to dose warfarin for this 79 yo female with atrial fibrillation on chronic anticoagulation with warfarin.   Home dose: 3mg  on Mon-Tues-Wed-Thurs-Fri only; nothing on Sat-Sun INR : 2.8.  no coumadin given 1/10 or 1/11.  CBC: H/H: 12.4/34.7     platelets 190  Goal of Therapy:  INR 2-3 Monitor platelets by anticoagulation protocol: Yes   Plan:  Warfarin 2 mg x 1 dose. Daily INR and CBC Monitor for signs/symptoms of bleeding.  Margot Ables, PharmD Clinical  Pharmacist 11/14/2019 8:16 AM

## 2019-11-14 NOTE — Progress Notes (Signed)
From cardiac standpoint being managed for acute systolic HF LVEF 44-96% in setting of COVID pneumonia Medical therapy with toprol 50mg  daily, aldactone 12.5mg  daily. With renal function would avoid ARB/ACE/ARNI at this time. No plans for cath, would plan for medical therapy and repeat echo, possible stress induced CM in setting of COVID 19 pneumonia.  Baseline Cr looks to be around 1.4. currently at 1.5, has not had repeat labs yet today. . On oral torsemide 20mg  bid Afib rates mildly elevated, yesterday we increased toprol to 75mg  daily. Tachy drive related to sepsis with COVID 19 pneumonoa, steroids. I think rates should improve as systemic illness improves.   Rates elevated, plenty of room with bp to titrate beta blocker. Increase toprol to 125mg  daily.   Zandra Abts MD

## 2019-11-14 NOTE — Evaluation (Signed)
Physical Therapy Evaluation Patient Details Name: [name] MRN: [id number] DOB: February 02, 1941 Today's Date: 11/14/2019   History of Present Illness  [name] is a 79 y.o. female with medical history significant for chronic hypoxic respiratory failure in the setting of chronic diastolic heart failure on 3 L continuous supplemental oxygen, paroxysmal atrial fibrillation chronically anticoagulated on warfarin, hypertension, stage III chronic kidney disease with baseline creatinine of 1.4-1.5, developmental delay, who is admitted    Clinical Impression  Patient limited mostly due to c/o severe frontal headache and asked for pain medication - RN notified.  Patient demonstrates slow labored movement and limited to a few unsteady slow steps at bedside without loss of balance, limited secondary to fatigue and generalized weakness.  Patient tolerated sitting up in chair after therapy.  Patient will benefit from continued physical therapy in hospital and recommended venue below to increase strength, balance, endurance for safe ADLs and gait.     Follow Up Recommendations SNF;Supervision for mobility/OOB;Supervision/Assistance - 24 hour    Equipment Recommendations  None recommended by PT    Recommendations for Other Services       Precautions / Restrictions Precautions Precautions: Fall Restrictions Weight Bearing Restrictions: No      Mobility  Bed Mobility Overal bed mobility: Needs Assistance Bed Mobility: Supine to Sit     Supine to sit: Min guard;Min assist;HOB elevated     General bed mobility comments: slow labored movement  Transfers Overall transfer level: Needs assistance Equipment used: Standard walker Transfers: Sit to/from Stand;Stand Pivot Transfers Sit to Stand: Min assist Stand pivot transfers: Min assist       General transfer comment: slow labored movement  Ambulation/Gait Ambulation/Gait assistance: Min assist Gait Distance (Feet): 4 Feet Assistive  device: Rolling walker (2 wheeled) Gait Pattern/deviations: Decreased step length - right;Decreased step length - left;Decreased stride length Gait velocity: decreased   General Gait Details: limited to 4-5 slow unsteady steps at bedside due to c/o fatigue and headache, no loss of balance  Stairs            Wheelchair Mobility    Modified Rankin (Stroke Patients Only)       Balance Overall balance assessment: Needs assistance Sitting-balance support: Feet supported;No upper extremity supported Sitting balance-Leahy Scale: Fair Sitting balance - Comments: fair/good seated at EOB   Standing balance support: During functional activity;Bilateral upper extremity supported Standing balance-Leahy Scale: Fair Standing balance comment: using Pick up walker                             Pertinent Vitals/Pain Pain Assessment: Faces Faces Pain Scale: Hurts even more Pain Location: headache Pain Descriptors / Indicators: Aching;Grimacing Pain Intervention(s): Limited activity within patient's tolerance;Monitored during session;Patient requesting pain meds-RN notified    Home Living Family/patient expects to be discharged to:: Private residence Living Arrangements: Other relatives Available Help at Discharge: Family;Personal care attendant;Available 24 hours/day Type of Home: Mobile home Home Access: Ramped entrance     Home Layout: One level Home Equipment: [place] - 2 wheels;Bedside commode;Wheelchair - manual;Shower seat;Hospital bed      Prior Function Level of Independence: Needs assistance   Gait / Transfers Assistance Needed: household ambulator with RW  ADL's / Homemaking Assistance Needed: assisted by family        Hand Dominance   Dominant Hand: Right    Extremity/Trunk Assessment   Upper Extremity Assessment Upper Extremity Assessment: Generalized weakness    Lower Extremity  Assessment Lower Extremity Assessment: Generalized weakness     Cervical / Trunk Assessment Cervical / Trunk Assessment: Kyphotic  Communication   Communication: No difficulties  Cognition Arousal/Alertness: Awake/alert Behavior During Therapy: WFL for tasks assessed/performed;Anxious Overall Cognitive Status: History of cognitive impairments - at baseline                                        General Comments      Exercises     Assessment/Plan    PT Assessment Patient needs continued PT services  PT Problem List Decreased strength;Decreased activity tolerance;Decreased balance;Decreased mobility       PT Treatment Interventions Gait training;Functional mobility training;Therapeutic activities;Therapeutic exercise;Patient/family education    PT Goals (Current goals can be found in the Care Plan section)  Acute Rehab PT Goals Patient Stated Goal: return home with family to assist PT Goal Formulation: With patient Time For Goal Achievement: 11/28/19 Potential to Achieve Goals: Good    Frequency Min 3X/week   Barriers to discharge        Co-evaluation               AM-PAC PT "6 Clicks" Mobility  Outcome Measure Help needed turning from your back to your side while in a flat bed without using bedrails?: A Little Help needed moving from lying on your back to sitting on the side of a flat bed without using bedrails?: A Little Help needed moving to and from a bed to a chair (including a wheelchair)?: A Little Help needed standing up from a chair using your arms (e.g., wheelchair or bedside chair)?: A Little Help needed to walk in hospital room?: A Lot Help needed climbing 3-5 steps with a railing? : Total 6 Click Score: 15    End of Session Equipment Utilized During Treatment: Oxygen Activity Tolerance: Patient tolerated treatment well;Patient limited by fatigue Patient left: in chair;with call bell/phone within reach;with chair alarm set Nurse Communication: Mobility status PT Visit Diagnosis:  Unsteadiness on feet (R26.81);Other abnormalities of gait and mobility (R26.89);Muscle weakness (generalized) (M62.81)    Time: 9935-7017 PT Time Calculation (min) (ACUTE ONLY): 30 min   Charges:   PT Evaluation $PT Eval Moderate Complexity: 1 Mod PT Treatments $Therapeutic Activity: 23-37 mins        9:26 AM, 11/14/19 Lonell Grandchild, MPT Physical Therapist with [facility] [phone] office [phone] mobile phone

## 2019-11-14 NOTE — Progress Notes (Signed)
From hallway, heard patient making gagging, heaving noises. In to assess, found pt attempting to cough up thick phlegm from throat, gagging as she coughed. Pt sat straight up in chair and coached to get sputum out. Finally expectorated moderately sized ball of thick/sticky mucous with dark streaks noted.  Chest ascultated, RUL now clear but expiratory wheezing noted left upper and left lower chest. Pt denies SOB. SaO2 92% on 3 lpm [place].

## 2019-11-14 NOTE — Progress Notes (Signed)
Pt assisted back to bed with NT and myself and walker used. Pt only able to bear weight for short period of time, unable to move feet or shuffle. Pt is pivot transfer to bed. MASD on perineal area cleaned, dried and protective cream applied. Pt has congested cough, currently unable to expectorate phlegm but no longer heaving. Pt refused all of supper meal, now asking for peanut butter crackers and ginger-ale. These items have been at bedside within easy patient reach all day and were offered to pt multiple times throughout day but did not want til now.

## 2019-11-14 NOTE — Plan of Care (Signed)
  Problem: Acute Rehab PT Goals(only PT should resolve) Goal: Pt Will Go Supine/Side To Sit Outcome: Progressing Flowsheets (Taken 11/14/2019 0931) Pt will go Supine/Side to Sit: with supervision Goal: Patient Will Transfer Sit To/From Stand Outcome: Progressing Flowsheets (Taken 11/14/2019 0931) Patient will transfer sit to/from stand: with min guard assist Goal: Pt Will Transfer Bed To Chair/Chair To Bed Outcome: Progressing Flowsheets (Taken 11/14/2019 0931) Pt will Transfer Bed to Chair/Chair to Bed: min guard assist Goal: Pt Will Ambulate Outcome: Progressing Flowsheets (Taken 11/14/2019 0931) Pt will Ambulate:  25 feet  with min guard assist  with rolling walker   9:32 AM, 11/14/19 Lonell Grandchild, MPT Physical Therapist with [facility] [phone] office [phone] mobile phone

## 2019-11-14 NOTE — Progress Notes (Signed)
PROGRESS NOTE    [name]  ZOX:[id number] DOB: 1941-09-23 DOA: 11/07/2019 PCP: Doree Albee, MD    Brief Narrative:  79 y.o. female with medical history significant for chronic hypoxic respiratory failure in the setting of chronic diastolic heart failure on 3 L continuous supplemental oxygen, paroxysmal atrial fibrillation chronically anticoagulated on warfarin, hypertension, stage III chronic kidney disease with baseline creatinine of 1.4-1.5, developmental delay, who is admitted to [facility] on 11/07/2019 with COVID-19 pneumonia after resenting from home to [facility] emergency department for evaluation of objective fever.   In the setting of history of developmental delay associated with baseline mental status, the following history is obtained via my discussions with the emergency department physician as well as via chart review.  Per the patient's niece, with whom the patient lives, multiple family members to whom the patient has been exposed over the last week have been experiencing new onset rhinitis, rhinorrhea, sore throat, shortness of breath and subjective fever, although niece reports that none of these family members have been tested for Covid at this time.  Subsequent to the above symptoms experienced by patient's family members, the patient reportedly developed an objective fever over the last day, with temperature max at home noted to be 102-103 F.  [name] reportedly exhibited some dry heaving over that time in the absence of any diarrhea or rash.  Per niece, no increase in patient's supplemental oxygen demands relative to her baseline requirement of 3 L continuous nasal cannula.  Patient has undergone no recent traveling.  Past medical history is notable for a history of chronic diastolic heart failure.  Most recent echocardiogram was performed in June 2019 and showed left ventricle associated with normal cavity size and normal wall thickness, LVEF 55 to 60%, no  focal wall motion abnormalities, but showed evidence of diastolic dysfunction as well as mild mitral regurgitation and moderate tricuspid regurgitation.  Outpatient diuretic regimen consists of torsemide 20 mg p.o. twice daily.  While on this diuretic, the patient is also prescribed potassium chloride 20 mEq p.o. twice daily.  However, the patient's niece reports that the patient ran out of her potassium supplementation approximately 1 week ago, and has been unable to subsequently fill this prescription.    Assessment & Plan: 1-sepsis secondary to pneumonia due to COVID-19 virus and a Staph aureus (MSSA) bacteremia -Patient reports feeling short of breath, using 3.5 L nasal supplementation with (at home chronically on 2.5-3 L) -Chest x-ray demonstrating bilateral infiltrates -On presentation patient met sepsis criteria; with elevated respiratory rate, elevated heart rate, low-grade temperature, elevated WBCs and also elevated lactic acid. -Sepsis features resolved. -Patient has completed remdesivir therapy and will continue steroids as per protocol.  -continue As needed antipyretics and bronchodilators. -Speciation demonstrating MSSA bacteremia; following ID recommendations patient transitioned to IV cefazolin only.  Will follow infectious disease recommendation for length of treatment (toal of 4 weeks). -2D echo suboptimal, but no demonstrating vegetations. -PICC line placed w/o complications. -Staph aureus species resistant to erythromycin and clindamycin.   2-paroxysmal atrial fibrillation (HCC) -Chronic -CHADsVASC score 5 -Continue B-blocker for rate control and use of warfarin for secondary prevention; pharmacy dosing. -B-blocker dose adjusted by cardiology, HR is elevated.  3-Chronic anticoagulation -Continue warfarin for anticoagulation as per pharmacy dosing. -Follow INR. -No signs of overt bleeding.  4-Chronic kidney disease, stage 3 -Appears overall stable and at baseline  currently. -Per GFR evaluation patient with a stage IIIb chronic kidney disease. -Continue to follow renal function trend. -Pharmacy  adjusting medications for renal function.  5-acute systolic heart failure  -Unable to assess diastolic function with atrial fibrillation at this time. -Patient with previous history of diastolic heart failure documentation and normal ejection fraction in 2017.   -Cardiology service has been consulted to assist with medication adjustment/optimization. -Continue to follow daily weights and low-sodium diet -Continue home dose torsemide. -Currently euvolemic and stable. -Per cardiology recommendations previews BID lopressor has been transitioned to extended release Toprol and patient has been started on spironolactone. -No ACE inhibitors, Entresto or ARB at this moment given chronic renal failure and slight fluctuation of Cr.  -Further treatment and medication adjustment to be decided by cardiologist at follow-up visit.  6-hypokalemia and hypomagnesemia -Magnesium repleted and within normal limits currently -After repletion and supplementation potassium is 4.0. -Follow electrolytes trend. -Following cardiology recommendations patient has been started on spironolactone for CHF, which would also help on potassium stability.  7-chronic respiratory failure -Continue oxygen supplementation -Follow O2 sats and clinical response; wean oxygen supplementation down to baseline as tolerated. -Patient uses 2.5-3 L oxygen supplementation chronically; currently using 3 L. -Continue as needed bronchodilators.  8-hypothyroidism -Continue Synthroid.  9-physical deconditioning -Physical therapy recommending skilled nursing facility -Will further discuss with [facility] team and family to assess chances of patient returning home with home health services versus skilled nursing facility placement.  DVT prophylaxis: Continue warfarin Code Status: Full Code Family Communication: No  family at bedside; niece updated over the phone. Disposition Plan: Remains inpatient, continue IV antibiotic (cefazolin), plan is to treat for a total of 4 weeks (currently 22 days pending); blood culture from 11/11/2019 - for 48 hours PICC line placed on 11/13/2019.  Patient has completed remdesivir infusion on 11/12/2019.  Continue steroids with anticipated tapering at discharge.  Hopefully home in the next 24-48 hours (pending further improvement in her heart rate control).  Consultants:   ID (Dr. Megan Salon) with automatic counsultation secondary to positive for staph bacteremia  Cardiology service (secondary to acute systolic heart failure).  Procedures:   See below for x-ray reports  Antimicrobials:  Anti-infectives (From admission, onward)   Start     Dose/Rate Route Frequency Ordered Stop   11/10/19 1600  vancomycin (VANCOREADY) IVPB 1250 mg/250 mL  Status:  Discontinued     1,250 mg 166.7 mL/hr over 90 Minutes Intravenous Every 24 hours 11/09/19 1414 11/10/19 0949   11/09/19 1200  ceFAZolin (ANCEF) IVPB 2g/100 mL premix     2 g 200 mL/hr over 30 Minutes Intravenous Every 8 hours 11/09/19 1016     11/09/19 1000  remdesivir 100 mg in sodium chloride 0.9 % 100 mL IVPB     100 mg 200 mL/hr over 30 Minutes Intravenous Daily 11/08/19 0752 11/12/19 1005   11/09/19 1000  vancomycin (VANCOREADY) IVPB 1750 mg/350 mL  Status:  Discontinued     1,750 mg 175 mL/hr over 120 Minutes Intravenous  Once 11/09/19 0939 11/09/19 0940   11/09/19 1000  vancomycin (VANCOREADY) IVPB 2000 mg/400 mL     2,000 mg 200 mL/hr over 120 Minutes Intravenous  Once 11/09/19 0940 11/09/19 1703   11/08/19 0800  remdesivir 200 mg in sodium chloride 0.9% 250 mL IVPB     200 mg 580 mL/hr over 30 Minutes Intravenous Once 11/08/19 0752 11/08/19 1256       Subjective: No fever, no chest pain, no nausea, no vomiting.  PICC line placed without complications.  Heart rate is still uncontrolled.  Patient is anxious to go  home.  Objective: Vitals:  11/14/19 1114 11/14/19 1339 11/14/19 1405 11/14/19 1634  BP: 121/88 125/73    Pulse: 82 78 (!) 118 (!) 112  Resp: (!) _0 Temp:  98.1 F (36.7 C)    TempSrc:  Oral    SpO2: 94% (!) 88% 92% 92%  Weight:      Height:        Intake/Output Summary (Last 24 hours) at 11/14/2019 1856 Last data filed at 11/14/2019 1800 Gross per 24 hour  Intake 320 ml  Output 450 ml  Net -130 ml   Filed Weights   11/12/19 0500 11/13/19 0500 11/14/19 0500  Weight: 84.1 kg 81.1 kg 82.6 kg    Examination: General exam: Alert, awake, oriented x 2; frustrated and looking to go home.  Denies chest pain, nausea or shortness of breath. Respiratory system: Decreased breath sounds at the bases, positive scattered rhonchi it.  Good O2 sat on 3-3.5 L.   Cardiovascular system: Irregular irregular, no rubs, no gallops, no JVD on exam.   Gastrointestinal system: Abdomen is nondistended, soft and nontender. No organomegaly or masses felt. Normal bowel sounds heard. Central nervous system: Alert and oriented. No focal neurological deficits. Extremities: No C/C/E, +pedal pulses Skin: No rashes, lesions or ulcers Psychiatry: Anxious to go home; no hallucinations.     Data Reviewed: I have personally reviewed following labs and imaging studies  CBC: Recent Labs  Lab 11/09/19 0616 11/10/19 0841 11/11/19 0639 11/12/19 0609 11/13/19 0529  WBC 11.8* 10.7* 15.9* 12.2* 10.5  NEUTROABS 10.9* 9.8* 14.7* 11.2* 9.5*  HGB 10.4* 11.1* 12.7 11.7* 12.4  HCT 31.8* 34.7* 39.3 36.2 38.4  MCV 102.6* 103.3* 104.0* 102.0* 102.7*  PLT 107* 121* 134* 147* 825   Basic Metabolic Panel: Recent Labs  Lab 11/09/19 0616 11/10/19 0841 11/11/19 0639 11/12/19 0609 11/13/19 0529  NA 136 135 136 137 138  K 2.5* 2.5* 3.1* 3.9 4.0  CL 91* 89* 88* 89* 90*  CO2 31 33* 35* 33* 34*  GLUCOSE 127* 142* 161* 125* 158*  BUN 26* 28* 29* 31* 32*  CREATININE 1.34* 1.49* 1.62* 1.51* 1.51*  CALCIUM  8.3* 8.3* 8.9 8.8* 8.9  MG 1.5* 1.8 1.7 1.7 1.7   GFR: Estimated Creatinine Clearance: 32.5 mL/min (A) (by C-G formula based on SCr of 1.51 mg/dL (H)).   Liver Function Tests: Recent Labs  Lab 11/09/19 0616 11/10/19 0841 11/11/19 0639 11/12/19 0609 11/13/19 0529  AST 38 32 _1 ALT _2 ALKPHOS 48 52 60 58 67  BILITOT 0.9 0.6 0.7 0.5 0.7  PROT 5.8* 6.0* 6.6 6.0* 6.1*  ALBUMIN 2.7* 2.6* 2.8* 2.5* 2.7*   Coagulation Profile: Recent Labs  Lab 11/10/19 0841 11/11/19 0639 11/12/19 0609 11/13/19 0529 11/14/19 0530  INR 3.1* 2.6* 2.8* 3.0* 2.8*   Anemia Panel: Recent Labs    11/12/19 0609 11/13/19 0529  FERRITIN 82 90   Urine analysis:    Component Value Date/Time   COLORURINE YELLOW 11/07/2019 [place] 11/07/2019 1454   LABSPEC 1.010 11/07/2019 1454   PHURINE 5.0 11/07/2019 1454   GLUCOSEU NEGATIVE 11/07/2019 1454   HGBUR SMALL (A) 11/07/2019 1454   BILIRUBINUR NEGATIVE 11/07/2019 1454   KETONESUR NEGATIVE 11/07/2019 1454   PROTEINUR NEGATIVE 11/07/2019 1454   NITRITE NEGATIVE 11/07/2019 1454   LEUKOCYTESUR NEGATIVE 11/07/2019 1454    Recent Results (from the past 240 hour(s))  Urine culture     Status: None   Collection Time:  11/07/19  2:54 PM   Specimen: Urine, Clean Catch  Result Value Ref Range Status   Specimen Description   Final    URINE, CLEAN CATCH Performed at [facility], [address] [place], [place] [address]    Special Requests   Final    NONE Performed at [facility], [address] [place], [place] [address]    Culture   Final    NO GROWTH Performed at [facility], [place] [address], [place], [place] [address]    Report Status 11/08/2019 FINAL  Final  Culture, blood (routine x 2)     Status: None   Collection Time: 11/07/19  3:46 PM   Specimen: BLOOD RIGHT HAND  Result Value Ref Range Status   Specimen Description BLOOD RIGHT HAND  Final   Special Requests   Final    BOTTLES DRAWN AEROBIC  ONLY Blood Culture adequate volume   Culture   Final    NO GROWTH 6 DAYS Performed at [facility], [address] [place], [place] [address]    Report Status 11/13/2019 FINAL  Final  Culture, blood (routine x 2)     Status: Abnormal   Collection Time: 11/07/19 10:10 PM   Specimen: BLOOD RIGHT HAND  Result Value Ref Range Status   Specimen Description   Final    BLOOD RIGHT HAND Performed at [facility], [address] [place], [place] [address]    Special Requests   Final    BOTTLES DRAWN AEROBIC AND ANAEROBIC Blood Culture adequate volume Performed at [facility], [address] [place], [place] [address]    Culture  Setup Time   Final    GRAM POSITIVE COCCI AEROBIC BOTTLE Gram Stain Report Called to,Read Back By and Verified With: BENGTSON, P ON 11/08/19 AT 1750 BY LOY,C  PERFORMED AT [facility] Performed at [facility], [address], [place], [place] [address]    Culture STAPHYLOCOCCUS AUREUS (A)  Final   Report Status 11/10/2019 FINAL  Final   Organism ID, Bacteria STAPHYLOCOCCUS AUREUS  Final      Susceptibility   Staphylococcus aureus - MIC*    CIPROFLOXACIN <=0.5 SENSITIVE Sensitive     ERYTHROMYCIN >=8 RESISTANT Resistant     GENTAMICIN <=0.5 SENSITIVE Sensitive     OXACILLIN <=0.25 SENSITIVE Sensitive     TETRACYCLINE <=1 SENSITIVE Sensitive     VANCOMYCIN <=0.5 SENSITIVE Sensitive     TRIMETH/SULFA <=10 SENSITIVE Sensitive     CLINDAMYCIN RESISTANT Resistant     RIFAMPIN <=0.5 SENSITIVE Sensitive     Inducible Clindamycin POSITIVE Resistant     * STAPHYLOCOCCUS AUREUS  Culture, blood (routine x 2)     Status: Abnormal   Collection Time: 11/09/19 12:30 PM   Specimen: BLOOD RIGHT WRIST  Result Value Ref Range Status   Specimen Description   Final    BLOOD RIGHT WRIST Performed at [facility], [address], [place], [place] [address]    Special Requests   Final    BOTTLES DRAWN AEROBIC AND ANAEROBIC Blood Culture adequate volume Performed at [facility],  [address] [place], [place] [address]    Culture  Setup Time   Final    GRAM POSITIVE COCCI IN BOTH AEROBIC AND ANAEROBIC BOTTLES Gram Stain Report Called to,Read Back By and Verified With: DEAN T. AT [id number] ON [id number] BY THOMPSON S. Performed at [facility], [address] [place], [place] [address]    Culture (A)  Final    STAPHYLOCOCCUS  AUREUS SUSCEPTIBILITIES PERFORMED ON PREVIOUS CULTURE WITHIN THE LAST 5 DAYS. Performed at [facility], [place] [address], [place], [place] [address]    Report Status 11/12/2019 FINAL  Final  Culture, blood (routine x 2)     Status: None (Preliminary result)   Collection Time: 11/09/19 12:38 PM   Specimen: BLOOD RIGHT HAND  Result Value Ref Range Status   Specimen Description BLOOD RIGHT HAND  Final   Special Requests   Final    BOTTLES DRAWN AEROBIC AND ANAEROBIC Blood Culture adequate volume   Culture   Final    NO GROWTH 4 DAYS Performed at [facility], [address] [place], [place] [address]    Report Status PENDING  Incomplete  Culture, blood (routine x 2)     Status: None (Preliminary result)   Collection Time: 11/11/19 11:18 AM   Specimen: Right Antecubital; Blood  Result Value Ref Range Status   Specimen Description   Final    RIGHT ANTECUBITAL BOTTLES DRAWN AEROBIC AND ANAEROBIC   Special Requests Blood Culture adequate volume  Final   Culture   Final    NO GROWTH 2 DAYS Performed at [facility], [address], [place], [place] [address]    Report Status PENDING  Incomplete  Culture, blood (routine x 2)     Status: None (Preliminary result)   Collection Time: 11/11/19 11:27 AM   Specimen: BLOOD RIGHT ARM  Result Value Ref Range Status   Specimen Description   Final    BLOOD RIGHT ARM BOTTLES DRAWN AEROBIC AND ANAEROBIC   Special Requests Blood Culture adequate volume  Final   Culture   Final    NO GROWTH 2 DAYS Performed at [facility], [address], [place], [place] [address]    Report Status PENDING  Incomplete       Radiology Studies: DG CHEST PORT 1 VIEW  Result Date: 11/13/2019 CLINICAL DATA:  79 year old female with positive COVID-19 status post central line placement. EXAM: PORTABLE CHEST 1 VIEW COMPARISON:  Chest radiograph dated 11/07/2019. FINDINGS: Right-sided PICC with tip in the region of the cavoatrial junction. There is cardiomegaly with mild vascular congestion. Diffuse bilateral interstitial prominence may represent mild edema or pneumonia. Clinical correlation is recommended. A small left pleural effusion may be present. No pneumothorax. No acute osseous pathology. IMPRESSION: 1. Right-sided PICC with tip over the cavoatrial junction. 2. Cardiomegaly with vascular congestion. 3. Interstitial prominence may represent edema versus pneumonia. Clinical correlation is recommended. Electronically Signed   By: Anner Crete M.D.   On: 11/13/2019 19:52   [place] EKG SITE RITE  Result Date: 11/13/2019 If Site Rite image not attached, placement could not be confirmed due to current cardiac rhythm.   Scheduled Meds: . vitamin C  500 mg Oral Daily  . Chlorhexidine Gluconate Cloth  6 each Topical Daily  . latanoprost  1 drop Both Eyes QHS  . methylPREDNISolone (SOLU-MEDROL) injection  0.5 mg/kg Intravenous Q12H  . metoprolol succinate  125 mg Oral Daily  . potassium chloride SA  40 mEq Oral BID  . saccharomyces boulardii  250 mg Oral BID  . sodium chloride flush  10-40 mL Intracatheter Q12H  . sodium chloride flush  3 mL Intravenous Q12H  . spironolactone  12.5 mg Oral Daily  . torsemide  20 mg Oral BID  . Warfarin - Pharmacist Dosing Inpatient   Does not apply q1800  . zinc sulfate  220 mg Oral Daily   Continuous Infusions: .  ceFAZolin (  ANCEF) IV 2 g (11/14/19 1319)     LOS: 7 days    Time spent: 35 minutes.   Barton Dubois, MD [facility] Pager [phone]   11/14/2019, 6:56 PM

## 2019-11-15 ENCOUNTER — Inpatient Hospital Stay (HOSPITAL_COMMUNITY): Payer: Medicare Other

## 2019-11-15 ENCOUNTER — Encounter (HOSPITAL_COMMUNITY): Payer: Self-pay | Admitting: Internal Medicine

## 2019-11-15 LAB — OCCULT BLOOD GASTRIC / DUODENUM (SPECIMEN CUP)
Occult Blood, Gastric: NEGATIVE
pH, Gastric: 3

## 2019-11-15 LAB — PROTIME-INR
INR: 3.2 — ABNORMAL HIGH (ref 0.8–1.2)
Prothrombin Time: 32.7 seconds — ABNORMAL HIGH (ref 11.4–15.2)

## 2019-11-15 LAB — CULTURE, BLOOD (ROUTINE X 2)
Culture: NO GROWTH
Special Requests: ADEQUATE

## 2019-11-15 LAB — HEMOGLOBIN AND HEMATOCRIT, BLOOD
HCT: 35.1 % — ABNORMAL LOW (ref 36.0–46.0)
Hemoglobin: 11 g/dL — ABNORMAL LOW (ref 12.0–15.0)

## 2019-11-15 LAB — OCCULT BLOOD X 1 CARD TO LAB, STOOL: Fecal Occult Bld: POSITIVE — AB

## 2019-11-15 MED ORDER — PANTOPRAZOLE SODIUM 40 MG IV SOLR
40.0000 mg | Freq: Two times a day (BID) | INTRAVENOUS | Status: DC
  Administered 2019-11-15 – 2019-11-18 (×7): 40 mg via INTRAVENOUS
  Filled 2019-11-15 (×7): qty 40

## 2019-11-15 MED ORDER — TRAZODONE HCL 50 MG PO TABS
50.0000 mg | ORAL_TABLET | Freq: Every evening | ORAL | Status: AC | PRN
  Administered 2019-11-15: 50 mg via ORAL
  Filled 2019-11-15: qty 1

## 2019-11-15 MED ORDER — ONDANSETRON HCL 4 MG/2ML IJ SOLN
4.0000 mg | INTRAMUSCULAR | Status: DC | PRN
  Administered 2019-11-15 (×2): 4 mg via INTRAVENOUS
  Filled 2019-11-15 (×2): qty 2

## 2019-11-15 MED ORDER — PROMETHAZINE HCL 25 MG/ML IJ SOLN
12.5000 mg | Freq: Four times a day (QID) | INTRAMUSCULAR | Status: DC | PRN
  Administered 2019-11-16: 12.5 mg via INTRAVENOUS
  Filled 2019-11-15 (×2): qty 1

## 2019-11-15 MED ORDER — METOPROLOL SUCCINATE ER 50 MG PO TB24
200.0000 mg | ORAL_TABLET | Freq: Every day | ORAL | Status: DC
  Administered 2019-11-15 – 2019-11-17 (×3): 200 mg via ORAL
  Filled 2019-11-15 (×3): qty 4

## 2019-11-15 NOTE — Progress Notes (Signed)
ANTICOAGULATION CONSULT NOTE   Pharmacy Consult for: warfarin dosing Indication: atrial fibrillation  No Known Allergies  Patient Measurements: Height: 5\' 4"  (162.6 cm) Weight: 185 lb 13.6 oz (84.3 kg) IBW/kg (Calculated) : 54.7  Vital Signs: Temp: 97.7 F (36.5 C) (01/13 0534) Temp Source: Oral (01/13 0534) BP: 117/93 (01/13 0534) Pulse Rate: 99 (01/13 0534)  Labs: Recent Labs    11/13/19 0529 11/14/19 0530 11/15/19 0511  HGB 12.4  --   --   HCT 38.4  --   --   PLT 190  --   --   LABPROT 31.1* 29.7* 32.7*  INR 3.0* 2.8* 3.2*  CREATININE 1.51*  --   --     Estimated Creatinine Clearance: 32.8 mL/min (A) (by C-G formula based on SCr of 1.51 mg/dL (H)).   Medical History: Past Medical History:  Diagnosis Date  . Adenomatous polyps    -resected via colonoscopy in 2011  . Anemia   . Anxiety    MR  . Atrial fibrillation ([place])   . CHF (congestive heart failure) ([place])   . Chronic kidney disease   . Degenerative joint disease    status post right total hip arthroplasty  . Dysrhythmia   . GERD (gastroesophageal reflux disease)    -Barrett's esophagus  . Glaucoma   . Glaucoma   . Gout   . Hyperlipidemia   . Hypertension   . Mental retardation   . Peripheral neuropathy 09/12/2019  . Pneumonia   . Premature ventricular contractions   . Restless leg syndrome   . Shortness of breath dyspnea   . Wears dentures   . Wears glasses     Assessment: Pharmacy consulted to dose warfarin for this 79 yo female with atrial fibrillation on chronic anticoagulation with warfarin.   Home dose: 3mg  on Mon-Tues-Wed-Thurs-Fri only; nothing on Sat-Sun INR :  INR increased to 3.2 today, supratherapeutic CBC: H/H: 12.4/34.7     platelets 190  Goal of Therapy:  INR 2-3 Monitor platelets by anticoagulation protocol: Yes   Plan:  No coumadin today Daily INR and CBC Monitor for signs/symptoms of bleeding.  Isac Sarna, BS [place], [place] Clinical Pharmacist Pager  [phone] 11/15/2019 9:23 AM

## 2019-11-15 NOTE — Progress Notes (Signed)
Pt states she doesn't feel sick to her stomach any more but is loudly heaving and yelling. No vomitus noted. When asked why she was yelling, pt states, "Change my TV." TV placed on requested channel. Call bell is in pt's lap. Water, gingerale and crackers on bedside table placed over pt's lap (pt is up in chair).

## 2019-11-15 NOTE — Progress Notes (Signed)
Pt banging call bell on bedside table. Entered room to find pt's chest covered in coffee-ground colored vomitus. Pt continues to heave, vomited 2 more times while attempting to clean her. Pt cleaned, gown changed. Pt took water to drink with no further heaving or vomiting noted. MD Cour[place] notified.

## 2019-11-15 NOTE — Progress Notes (Signed)
Pt took all am meds without difficulty. Drank 272ml of H20 with meds. Continues to yell out and bang call bell on bedside table. When asked why she is behaving this way, she states, "I'm hot". Temperature in her room has been adjusted x5 since 0715 this am. Pt asked to talk with her niece. Niece's number dialed and pt talked with niece on phone.

## 2019-11-15 NOTE — Progress Notes (Signed)
PROGRESS NOTE    [name]  YOV:[id number] DOB: 19-May-1941 DOA: 11/07/2019 PCP: Doree Albee, MD    Brief Narrative:  79 y.o. female with medical history significant for chronic hypoxic respiratory failure in the setting of chronic diastolic heart failure on 3 L continuous supplemental oxygen, paroxysmal atrial fibrillation chronically anticoagulated on warfarin, hypertension, stage III chronic kidney disease with baseline creatinine of 1.4-1.5, developmental delay, who is admitted to [facility] on 11/07/2019 with COVID-19 pneumonia after resenting from home to [facility] emergency department for evaluation of objective fever.   In the setting of history of developmental delay associated with baseline mental status, the following history is obtained via my discussions with the emergency department physician as well as via chart review.  Per the patient's niece, with whom the patient lives, multiple family members to whom the patient has been exposed over the last week have been experiencing new onset rhinitis, rhinorrhea, sore throat, shortness of breath and subjective fever, although niece reports that none of these family members have been tested for Covid at this time.  Subsequent to the above symptoms experienced by patient's family members, the patient reportedly developed an objective fever over the last day, with temperature max at home noted to be 102-103 F.  [name] reportedly exhibited some dry heaving over that time in the absence of any diarrhea or rash.  Per niece, no increase in patient's supplemental oxygen demands relative to her baseline requirement of 3 L continuous nasal cannula.  Patient has undergone no recent traveling.  Past medical history is notable for a history of chronic diastolic heart failure.  Most recent echocardiogram was performed in June 2019 and showed left ventricle associated with normal cavity size and normal wall thickness, LVEF 55 to 60%, no  focal wall motion abnormalities, but showed evidence of diastolic dysfunction as well as mild mitral regurgitation and moderate tricuspid regurgitation.  Outpatient diuretic regimen consists of torsemide 20 mg p.o. twice daily.  While on this diuretic, the patient is also prescribed potassium chloride 20 mEq p.o. twice daily.  However, the patient's niece reports that the patient ran out of her potassium supplementation approximately 1 week ago, and has been unable to subsequently fill this prescription.    Assessment & Plan: 1-sepsis secondary to pneumonia due to COVID-19 virus and a Staph aureus (MSSA) bacteremia --Dyspnea is not worse, currently on 4 L of oxygen via nasal (at home chronically on 2.5-3 L) -Chest x-ray demonstrating bilateral infiltrates -On presentation patient met sepsis criteria; with elevated respiratory rate, elevated heart rate, low-grade temperature, elevated WBCs and also elevated lactic acid. -Sepsis pathophysiology resolved. -Patient has completed remdesivir therapy  -Okay to stop Solu-Medrol (started 11/08/19)  given concerns about GI bleed .  -continue As needed antipyretics and bronchodilators.  2)MSSA Bacteremia; following ID recommendations patient transitioned to IV cefazolin only.  Will follow infectious disease recommendation for length of treatment (toal of 4 weeks)- last dose 12/06/19 -2D echo suboptimal, but no demonstrating vegetations. -PICC line placed w/o complications. -Staph aureus species resistant to erythromycin and clindamycin.   2-Possible GI bleed--Gastroccult pending, coffee-ground emesis noted, hold Coumadin, start IV Protonix 40 every 12hr -Concerns about steroid-induced ulcer -serial H&H -May need EGD  3)paroxysmal atrial fibrillation ([name]) -CHADsVASC score 5 -Continue B-blocker for rate control  -B-blocker dose adjusted by cardiology,  -Hold Coumadin due to concerns about GI bleed   4-Chronic anticoagulation -Follow INR. -Hold  Coumadin due to concerns about GI bleed  5-Chronic kidney  disease, stage 3 -Appears overall stable and at baseline currently. -Per GFR evaluation patient with a stage IIIb chronic kidney disease. -Continue to follow renal function trend. -Pharmacy adjusting medications for renal function.  6-acute systolic heart failure  -Unable to assess diastolic function with atrial fibrillation at this time. -Patient with previous history of diastolic heart failure documentation and normal ejection fraction in 2017.   -Cardiology service has been consulted to assist with medication adjustment/optimization. -Continue to follow daily weights and low-sodium diet -Continue home dose torsemide. -Currently euvolemic and stable. -Per cardiology recommendations previews BID lopressor has been transitioned to extended release Toprol and patient has been started on spironolactone. -No ACE inhibitors, Entresto or ARB at this moment given chronic renal failure and slight fluctuation of Cr.  -Further treatment and medication adjustment to be decided by cardiologist at follow-up visit.  7-hypokalemia and hypomagnesemia -Magnesium repleted and within normal limits currently -After repletion and supplementation potassium is 4.0. -Follow electrolytes trend. -Following cardiology recommendations patient has been started on spironolactone for CHF, which would also help on potassium stability.  8-chronic respiratory failure -Continue oxygen supplementation -Follow O2 sats and clinical response; wean oxygen supplementation down to baseline as tolerated. -Patient uses 2.5-3 L oxygen supplementation chronically; currently using 3 L. -Continue as needed bronchodilators.  9-hypothyroidism -Continue Synthroid.  10-physical deconditioning -Physical therapy recommending skilled nursing facility -Will further discuss with [facility] team and family to assess chances of patient returning home with home health services versus  skilled nursing facility placement.  11)social/Ethics--- plan of care as already discussed with patient's sister [name] and patient's niece Ms Beau Fanny is a DNR/DNI  DVT prophylaxis: Continue warfarin Code Status: DNR Family Communication: No family at bedside; niece updated over the phone. Disposition Plan: Remains inpatient, continue IV antibiotic (cefazolin), plan is to treat for a total of 4 weeks (currently 22 days pending); blood culture from 11/11/2019 - for 48 hours PICC line placed on 11/13/2019.  Patient has completed remdesivir infusion on 11/12/2019.  Continue steroids with anticipated tapering at discharge.  Hopefully home in the next 24-48 hours (pending further improvement in her heart rate control).  Consultants:   ID (Dr. Megan Salon) with automatic counsultation secondary to positive for staph bacteremia  Cardiology service (secondary to acute systolic heart failure).  Procedures:   See below for x-ray reports  Antimicrobials:  Anti-infectives (From admission, onward)   Start     Dose/Rate Route Frequency Ordered Stop   11/10/19 1600  vancomycin (VANCOREADY) IVPB 1250 mg/250 mL  Status:  Discontinued     1,250 mg 166.7 mL/hr over 90 Minutes Intravenous Every 24 hours 11/09/19 1414 11/10/19 0949   11/09/19 1200  ceFAZolin (ANCEF) IVPB 2g/100 mL premix     2 g 200 mL/hr over 30 Minutes Intravenous Every 8 hours 11/09/19 1016     11/09/19 1000  remdesivir 100 mg in sodium chloride 0.9 % 100 mL IVPB     100 mg 200 mL/hr over 30 Minutes Intravenous Daily 11/08/19 0752 11/12/19 1005   11/09/19 1000  vancomycin (VANCOREADY) IVPB 1750 mg/350 mL  Status:  Discontinued     1,750 mg 175 mL/hr over 120 Minutes Intravenous  Once 11/09/19 0939 11/09/19 0940   11/09/19 1000  vancomycin (VANCOREADY) IVPB 2000 mg/400 mL     2,000 mg 200 mL/hr over 120 Minutes Intravenous  Once 11/09/19 0940 11/09/19 1703   11/08/19 0800  remdesivir 200 mg in sodium chloride  0.9% 250 mL IVPB     200 mg  580 mL/hr over 30 Minutes Intravenous Once 11/08/19 0752 11/08/19 1256       Subjective: -Possible GI bleed, with coffee-ground emesis -Intermittently confused and disoriented.  Objective: Vitals:   11/14/19 2148 11/15/19 0534 11/15/19 1150 11/15/19 1347  BP: (!) 146/84 (!) 117/93 (!) 146/83 137/74  Pulse: 100 99 (!) 124 (!) 113  Resp: _0 Temp:  97.7 F (36.5 C)    TempSrc:  Oral    SpO2: 90% 99% 95% 94%  Weight:  84.3 kg    Height:        Intake/Output Summary (Last 24 hours) at 11/15/2019 1433 Last data filed at 11/15/2019 0500 Gross per 24 hour  Intake 60 ml  Output 800 ml  Net -740 ml   Filed Weights   11/13/19 0500 11/14/19 0500 11/15/19 0534  Weight: 81.1 kg 82.6 kg 84.3 kg    Examination: General exam: Alert, awake, oriented x 2; frustrated and looking to go home.  Denies chest pain, nausea or shortness of breath. Nose- [place] 4L/min Respiratory system: Decreased breath sounds at the bases, positive scattered rhonchi it.    Cardiovascular system: Irregular irregular, no rubs, no gallops, no JVD on exam.   Gastrointestinal system: Abdomen is nondistended, soft and nontender. No organomegaly or masses felt. Normal bowel sounds heard. Central nervous system: Alert and oriented. No focal neurological deficits. Extremities: No C/C/E, +pedal pulses Skin: No rashes, lesions or ulcers Psychiatry: Anxious to go home; no hallucinations, underlying cognitive and memory deficits  Data Reviewed: I have personally reviewed following labs and imaging studies  CBC: Recent Labs  Lab 11/09/19 0616 11/10/19 0841 11/11/19 0639 11/12/19 0609 11/13/19 0529  WBC 11.8* 10.7* 15.9* 12.2* 10.5  NEUTROABS 10.9* 9.8* 14.7* 11.2* 9.5*  HGB 10.4* 11.1* 12.7 11.7* 12.4  HCT 31.8* 34.7* 39.3 36.2 38.4  MCV 102.6* 103.3* 104.0* 102.0* 102.7*  PLT 107* 121* 134* 147* 790   Basic Metabolic Panel: Recent Labs  Lab 11/09/19 0616 11/10/19 0841  11/11/19 0639 11/12/19 0609 11/13/19 0529  NA 136 135 136 137 138  K 2.5* 2.5* 3.1* 3.9 4.0  CL 91* 89* 88* 89* 90*  CO2 31 33* 35* 33* 34*  GLUCOSE 127* 142* 161* 125* 158*  BUN 26* 28* 29* 31* 32*  CREATININE 1.34* 1.49* 1.62* 1.51* 1.51*  CALCIUM 8.3* 8.3* 8.9 8.8* 8.9  MG 1.5* 1.8 1.7 1.7 1.7   GFR: Estimated Creatinine Clearance: 32.8 mL/min (A) (by C-G formula based on SCr of 1.51 mg/dL (H)).   Liver Function Tests: Recent Labs  Lab 11/09/19 0616 11/10/19 0841 11/11/19 0639 11/12/19 0609 11/13/19 0529  AST 38 32 _1 ALT _2 ALKPHOS 48 52 60 58 67  BILITOT 0.9 0.6 0.7 0.5 0.7  PROT 5.8* 6.0* 6.6 6.0* 6.1*  ALBUMIN 2.7* 2.6* 2.8* 2.5* 2.7*   Coagulation Profile: Recent Labs  Lab 11/11/19 0639 11/12/19 0609 11/13/19 0529 11/14/19 0530 11/15/19 0511  INR 2.6* 2.8* 3.0* 2.8* 3.2*   Anemia Panel: Recent Labs    11/13/19 0529  FERRITIN 90   Urine analysis:    Component Value Date/Time   COLORURINE YELLOW 11/07/2019 [place] 11/07/2019 1454   LABSPEC 1.010 11/07/2019 1454   PHURINE 5.0 11/07/2019 1454   GLUCOSEU NEGATIVE 11/07/2019 1454   HGBUR SMALL (A) 11/07/2019 1454   BILIRUBINUR NEGATIVE 11/07/2019 1454   KETONESUR NEGATIVE 11/07/2019 1454   PROTEINUR NEGATIVE 11/07/2019 1454   NITRITE NEGATIVE  11/07/2019 [place] 11/07/2019 1454    Recent Results (from the past 240 hour(s))  Urine culture     Status: None   Collection Time: 11/07/19  2:54 PM   Specimen: Urine, Clean Catch  Result Value Ref Range Status   Specimen Description   Final    URINE, CLEAN CATCH Performed at [facility], [address], [place], [place] [address]    Special Requests   Final    NONE Performed at [facility], [address] [place], [place] [address]    Culture   Final    NO GROWTH Performed at [facility], [place] [address] [place], [place] [address]    Report Status 11/08/2019 FINAL  Final  Culture,  blood (routine x 2)     Status: None   Collection Time: 11/07/19  3:46 PM   Specimen: BLOOD RIGHT HAND  Result Value Ref Range Status   Specimen Description BLOOD RIGHT HAND  Final   Special Requests   Final    BOTTLES DRAWN AEROBIC ONLY Blood Culture adequate volume   Culture   Final    NO GROWTH 6 DAYS Performed at [facility], [address] [place], [place] [address]    Report Status 11/13/2019 FINAL  Final  Culture, blood (routine x 2)     Status: Abnormal   Collection Time: 11/07/19 10:10 PM   Specimen: BLOOD RIGHT HAND  Result Value Ref Range Status   Specimen Description   Final    BLOOD RIGHT HAND Performed at [facility], [address], [place], [place] [address]    Special Requests   Final    BOTTLES DRAWN AEROBIC AND ANAEROBIC Blood Culture adequate volume Performed at [facility], [address], [place], [place] [address]    Culture  Setup Time   Final    GRAM POSITIVE COCCI AEROBIC BOTTLE Gram Stain Report Called to,Read Back By and Verified With: BENGTSON, P ON 11/08/19 AT 1750 BY LOY,C  PERFORMED AT [facility] Performed at [facility], [address], [place], [place] [address]    Culture STAPHYLOCOCCUS AUREUS (A)  Final   Report Status 11/10/2019 FINAL  Final   Organism ID, Bacteria STAPHYLOCOCCUS AUREUS  Final      Susceptibility   Staphylococcus aureus - MIC*    CIPROFLOXACIN <=0.5 SENSITIVE Sensitive     ERYTHROMYCIN >=8 RESISTANT Resistant     GENTAMICIN <=0.5 SENSITIVE Sensitive     OXACILLIN <=0.25 SENSITIVE Sensitive     TETRACYCLINE <=1 SENSITIVE Sensitive     VANCOMYCIN <=0.5 SENSITIVE Sensitive     TRIMETH/SULFA <=10 SENSITIVE Sensitive     CLINDAMYCIN RESISTANT Resistant     RIFAMPIN <=0.5 SENSITIVE Sensitive     Inducible Clindamycin POSITIVE Resistant     * STAPHYLOCOCCUS AUREUS  Culture, blood (routine x 2)     Status: Abnormal   Collection Time: 11/09/19 12:30 PM   Specimen: BLOOD RIGHT WRIST  Result Value Ref Range Status   Specimen  Description   Final    BLOOD RIGHT WRIST Performed at [facility], [address] [place], [place] [address]    Special Requests   Final    BOTTLES DRAWN AEROBIC AND ANAEROBIC Blood Culture adequate volume Performed at [facility], [address] [place], [place] [address]    Culture  Setup Time   Final    GRAM POSITIVE COCCI IN BOTH AEROBIC AND ANAEROBIC BOTTLES Gram Stain Report Called to,Read Back By and Verified With:  DEAN T. AT [id number] ON [id number] BY THOMPSON S. Performed at [facility], [address] [place], [place] [address]    Culture (A)  Final    STAPHYLOCOCCUS AUREUS SUSCEPTIBILITIES PERFORMED ON PREVIOUS CULTURE WITHIN THE LAST 5 DAYS. Performed at [facility], [place] [address] [place], [place] [address]    Report Status 11/12/2019 FINAL  Final  Culture, blood (routine x 2)     Status: None   Collection Time: 11/09/19 12:38 PM   Specimen: BLOOD RIGHT HAND  Result Value Ref Range Status   Specimen Description BLOOD RIGHT HAND  Final   Special Requests   Final    BOTTLES DRAWN AEROBIC AND ANAEROBIC Blood Culture adequate volume   Culture   Final    NO GROWTH 6 DAYS Performed at [facility], [address] [place], [place] [address]    Report Status 11/15/2019 FINAL  Final  Culture, blood (routine x 2)     Status: None (Preliminary result)   Collection Time: 11/11/19 11:18 AM   Specimen: Right Antecubital; Blood  Result Value Ref Range Status   Specimen Description   Final    RIGHT ANTECUBITAL BOTTLES DRAWN AEROBIC AND ANAEROBIC   Special Requests Blood Culture adequate volume  Final   Culture   Final    NO GROWTH 4 DAYS Performed at [facility], [address] [place], [place] [address]    Report Status PENDING  Incomplete  Culture, blood (routine x 2)     Status: None (Preliminary result)   Collection Time: 11/11/19 11:27 AM   Specimen: BLOOD RIGHT ARM  Result Value Ref Range Status   Specimen Description   Final    BLOOD RIGHT ARM BOTTLES DRAWN  AEROBIC AND ANAEROBIC   Special Requests Blood Culture adequate volume  Final   Culture   Final    NO GROWTH 4 DAYS Performed at [facility], [address] [place], [place] [address]    Report Status PENDING  Incomplete     Radiology Studies: DG CHEST PORT 1 VIEW  Result Date: 11/13/2019 CLINICAL DATA:  79 year old female with positive COVID-19 status post central line placement. EXAM: PORTABLE CHEST 1 VIEW COMPARISON:  Chest radiograph dated 11/07/2019. FINDINGS: Right-sided PICC with tip in the region of the cavoatrial junction. There is cardiomegaly with mild vascular congestion. Diffuse bilateral interstitial prominence may represent mild edema or pneumonia. Clinical correlation is recommended. A small left pleural effusion may be present. No pneumothorax. No acute osseous pathology. IMPRESSION: 1. Right-sided PICC with tip over the cavoatrial junction. 2. Cardiomegaly with vascular congestion. 3. Interstitial prominence may represent edema versus pneumonia. Clinical correlation is recommended. Electronically Signed   By: Anner Crete M.D.   On: 11/13/2019 19:52    Scheduled Meds:  vitamin C  500 mg Oral Daily   Chlorhexidine Gluconate Cloth  6 each Topical Daily   latanoprost  1 drop Both Eyes QHS   methylPREDNISolone (SOLU-MEDROL) injection  0.5 mg/kg Intravenous Q12H   metoprolol succinate  200 mg Oral Daily   pantoprazole (PROTONIX) IV  40 mg Intravenous Q12H   potassium chloride SA  40 mEq Oral BID   saccharomyces boulardii  250 mg Oral BID   sodium chloride flush  10-40 mL Intracatheter Q12H   sodium chloride flush  3 mL Intravenous Q12H   spironolactone  12.5 mg Oral Daily   torsemide  20 mg Oral BID   zinc sulfate  220 mg Oral Daily   Continuous Infusions:  ceFAZolin (ANCEF) IV 2 g (11/15/19 0515)     LOS: 8 days    Time spent: 35 minutes.   Roxan Hockey, MD [facility] Pager [phone]   11/15/2019, 2:32 PM

## 2019-11-15 NOTE — TOC Transition Note (Signed)
Transition of Care [facility]) - CM/SW Discharge Note   Patient Details  Name: [name] MRN: [id number] Date of Birth: 1941-04-08  [facility] CM/SW Contact:  Ihor Gully, LCSW Phone Number: 11/15/2019, 12:47 PM   Clinical Narrative:    Vaughan Basta with [facility] advised that patient was discharging today. [name] with [facility] advised of discharge.    Final next level of care: [place] Barriers to Discharge: Continued Medical Work up   Patient Goals and CMS Choice        Discharge Placement                       Discharge Plan and Services                          HH Arranged: RN, IV Antibiotics HH Agency: [place] ([facility]) Date HH Agency Contacted: 11/15/19 Time [place]: 1241 Representative spoke with at [place]: Vaughan Basta with [facility]. [name] with [facility] received referral on 11/14/19  Social Determinants of Health (SDOH) Interventions     Readmission Risk Interventions No flowsheet data found.

## 2019-11-15 NOTE — Care Management Important Message (Signed)
Important Message  Patient Details  Name: [name] MRN: [id number] Date of Birth: Sep 18, 1941   Medicare Important Message Given:  Other (see comment)(Crystal, RN agreed to deliver letter to patient due to airborne and contact precautions)     Tommy Medal 11/15/2019, 1:20 PM

## 2019-11-15 NOTE — Progress Notes (Signed)
Pt refused breakfast again this am. Sipping on H20 and gingerale. Crackers and chips offered to pt but she also refused. Pt then stated she felt sick to her stomach. Zofran 4mg  IV given.

## 2019-11-15 NOTE — Progress Notes (Signed)
Telemetry reviewed today. She has had afib with elevated rates in setting of sepsis and COVID , yesterday we increased her toprol to 125mg  daily. BP's tolerated changge, HRs Rates remain elevated, we will increase toprol to 200mg  daily   Zandra Abts MD

## 2019-11-16 LAB — BASIC METABOLIC PANEL
Anion gap: 13 (ref 5–15)
Anion gap: 11 (ref 5–15)
BUN: 53 mg/dL — ABNORMAL HIGH (ref 8–23)
BUN: 52 mg/dL — ABNORMAL HIGH (ref 8–23)
CO2: 30 mmol/L (ref 22–32)
CO2: 31 mmol/L (ref 22–32)
Calcium: 8.8 mg/dL — ABNORMAL LOW (ref 8.9–10.3)
Calcium: 8.7 mg/dL — ABNORMAL LOW (ref 8.9–10.3)
Chloride: 91 mmol/L — ABNORMAL LOW (ref 98–111)
Chloride: 93 mmol/L — ABNORMAL LOW (ref 98–111)
Creatinine, Ser: 2.21 mg/dL — ABNORMAL HIGH (ref 0.44–1.00)
Creatinine, Ser: 2.22 mg/dL — ABNORMAL HIGH (ref 0.44–1.00)
GFR calc Af Amer: 24 mL/min — ABNORMAL LOW (ref >60)
GFR calc Af Amer: 24 mL/min — ABNORMAL LOW (ref >60)
GFR calc non Af Amer: 21 mL/min — ABNORMAL LOW (ref >60)
GFR calc non Af Amer: 21 mL/min — ABNORMAL LOW (ref >60)
Glucose, Bld: 134 mg/dL — ABNORMAL HIGH (ref 70–99)
Glucose, Bld: 156 mg/dL — ABNORMAL HIGH (ref 70–99)
Potassium: 6.1 mmol/L — ABNORMAL HIGH (ref 3.5–5.1)
Potassium: 5.5 mmol/L — ABNORMAL HIGH (ref 3.5–5.1)
Sodium: 134 mmol/L — ABNORMAL LOW (ref 135–145)
Sodium: 135 mmol/L (ref 135–145)

## 2019-11-16 LAB — TROPONIN I (HIGH SENSITIVITY)
Troponin I (High Sensitivity): 29 ng/L — ABNORMAL HIGH (ref <18)
Troponin I (High Sensitivity): 30 ng/L — ABNORMAL HIGH (ref <18)

## 2019-11-16 LAB — PROTIME-INR
INR: 4.5 (ref 0.8–1.2)
INR: 4 — ABNORMAL HIGH (ref 0.8–1.2)
Prothrombin Time: 42.9 seconds — ABNORMAL HIGH (ref 11.4–15.2)
Prothrombin Time: 39.3 seconds — ABNORMAL HIGH (ref 11.4–15.2)

## 2019-11-16 LAB — CULTURE, BLOOD (ROUTINE X 2)
Culture: NO GROWTH
Culture: NO GROWTH
Special Requests: ADEQUATE
Special Requests: ADEQUATE

## 2019-11-16 LAB — HEMOGLOBIN AND HEMATOCRIT, BLOOD
HCT: 35.1 % — ABNORMAL LOW (ref 36.0–46.0)
HCT: 29.5 % — ABNORMAL LOW (ref 36.0–46.0)
Hemoglobin: 11.1 g/dL — ABNORMAL LOW (ref 12.0–15.0)
Hemoglobin: 9.6 g/dL — ABNORMAL LOW (ref 12.0–15.0)

## 2019-11-16 LAB — MRSA PCR SCREENING: MRSA by PCR: NEGATIVE

## 2019-11-16 MED ORDER — CALCIUM GLUCONATE-NACL 1-0.675 GM/50ML-% IV SOLN
1.0000 g | Freq: Once | INTRAVENOUS | Status: AC
  Administered 2019-11-16: 11:00:00 1000 mg via INTRAVENOUS
  Filled 2019-11-16: qty 50

## 2019-11-16 MED ORDER — SODIUM POLYSTYRENE SULFONATE 15 GM/60ML PO SUSP
30.0000 g | Freq: Once | ORAL | Status: AC
  Administered 2019-11-16: 09:00:00 30 g via ORAL
  Filled 2019-11-16: qty 120

## 2019-11-16 MED ORDER — ORAL CARE MOUTH RINSE
15.0000 mL | Freq: Two times a day (BID) | OROMUCOSAL | Status: DC
  Administered 2019-11-16 – 2019-11-18 (×5): 15 mL via OROMUCOSAL

## 2019-11-16 MED ORDER — METOPROLOL TARTRATE 5 MG/5ML IV SOLN
5.0000 mg | Freq: Once | INTRAVENOUS | Status: AC
  Administered 2019-11-16: 06:00:00 5 mg via INTRAVENOUS
  Filled 2019-11-16: qty 5

## 2019-11-16 MED ORDER — METOPROLOL TARTRATE 5 MG/5ML IV SOLN
5.0000 mg | Freq: Once | INTRAVENOUS | Status: AC
  Administered 2019-11-16: 5 mg via INTRAVENOUS
  Filled 2019-11-16: qty 5

## 2019-11-16 MED ORDER — CEFAZOLIN SODIUM-DEXTROSE 2-4 GM/100ML-% IV SOLN
2.0000 g | Freq: Two times a day (BID) | INTRAVENOUS | Status: DC
  Administered 2019-11-16 – 2019-11-18 (×4): 2 g via INTRAVENOUS
  Filled 2019-11-16 (×4): qty 100

## 2019-11-16 MED ORDER — DILTIAZEM HCL-DEXTROSE 125-5 MG/125ML-% IV SOLN (PREMIX): 5.0000 mg/h | INTRAVENOUS | Status: DC

## 2019-11-16 MED ORDER — METOPROLOL TARTRATE 5 MG/5ML IV SOLN
5.0000 mg | INTRAVENOUS | Status: DC | PRN
  Administered 2019-11-17 (×2): 5 mg via INTRAVENOUS
  Filled 2019-11-16 (×2): qty 5

## 2019-11-16 NOTE — Progress Notes (Addendum)
Telemetry reviewed today. She has had persistent longstanding afib, issues with elevated rates in setting of sepsis and COVID this admission , yesterday we increased her toprol to 200 mg daily. As outpatient she had been controlled with low dose beta blockers. BP's tolerated change. Rates remain elevated. WOuld avoid dilt given low LVEF. She got 5mg  of IV lopressor this AM x 2. With AKI would avoid digoxin. Can use PRN lopressor as needed, if sustained severe tachycardia would need to start amio drip. Given her systemic illness I think rates 120s or less are reasonable as a significant part of her tachy drive is phsyiologic from acute infection. ICU beds are also limited at this time, would work with her oral toprol 200mg  and prn lopressor as needed for now.   Supratherapeutic INR, coumadin on hold.     Acute systolic HF LVEF 53-97% in setting of COVID pneumonia. Medical therapy with toprol 200mg  daliy. Other therapy prohibited due to poor renal function and hyperkalemia. No plans for inpatient ischemic testing, would manage medically with repeat outpatient echo in next few months, if persistent dysfunction then would plan for cath. With AKI would hold torsemide. With hyperkalemia would hold aldactone.  Hyerkalemia, EKG this AM without acute changes. We are stopping aldactone. Management per primary team. Fairly rapid elevation, repeat stat K to verify. I did write for 1g of IV calcium gluconate for arrhythmia protection.     Zandra Abts MD

## 2019-11-16 NOTE — Progress Notes (Signed)
Xcover Afib, w RVR per RN No chest pain  A/P Afib with RVR 12 lead ekg Trop I q2h x2 Metoprolol 5mg  iv x1

## 2019-11-16 NOTE — Progress Notes (Signed)
Pt's HR is currently sustaining in the 110s-150s. ICU beds unavailable at this time. Camera operator and [facility] notified. Jani Gravel MD notified. 5mg  Lopressor IV push ordered once. No further orders at this time. Will continue to monitor pt.

## 2019-11-16 NOTE — Progress Notes (Signed)
PT Cancellation Note  Patient Details Name: STEPHENIE NAVEJAS MRN: [id number] DOB: 26-Jun-1941   Cancelled Treatment:    Reason Eval/Treat Not Completed: Medical issues which prohibited therapy.  Patient transferred to a higher level of care and will need new PT consult resume therapy when patient is medically stable.  Thank you.   4:02 PM, 11/16/19 Lonell Grandchild, MPT Physical Therapist with [facility] 336 [phone] office [phone] mobile phone

## 2019-11-16 NOTE — Progress Notes (Signed)
Pt's heart rate is in the 120s-150s. MD notified. Ordered to give 5mg   Lopressor IV and STAT 12 lead ekg. Lab is on the floor drawing a trop I at this time. Healthcare team will continue to monitor pt.

## 2019-11-16 NOTE — Progress Notes (Signed)
CRITICAL VALUE ALERT  Critical Value: INR 4.5 Date & Time Notied: [id number] on 11/16/2019 Provider Notified: Jani Gravel MD at [phone] on 11/16/2019  Orders Received/Actions taken: None at this time.

## 2019-11-16 NOTE — Plan of Care (Signed)
  Problem: Education: Goal: Knowledge of General Education information will improve Description: Including pain rating scale, medication(s)/side effects and non-pharmacologic comfort measures Outcome: Not Progressing   Problem: Health Behavior/Discharge Planning: Goal: Ability to manage health-related needs will improve Outcome: Not Progressing   Problem: Clinical Measurements: Goal: Ability to maintain clinical measurements within normal limits will improve Outcome: Not Progressing Goal: Will remain free from infection Outcome: Progressing Goal: Diagnostic test results will improve Outcome: Not Progressing Goal: Respiratory complications will improve Outcome: Progressing Goal: Cardiovascular complication will be avoided Outcome: Progressing   Problem: Activity: Goal: Risk for activity intolerance will decrease Outcome: Not Progressing   Problem: Nutrition: Goal: Adequate nutrition will be maintained Outcome: Not Progressing   Problem: Coping: Goal: Level of anxiety will decrease Outcome: Not Progressing   Problem: Elimination: Goal: Will not experience complications related to bowel motility Outcome: Progressing Goal: Will not experience complications related to urinary retention Outcome: Progressing   Problem: Pain Managment: Goal: General experience of comfort will improve Outcome: Not Progressing   Problem: Safety: Goal: Ability to remain free from injury will improve Outcome: Progressing   Problem: Skin Integrity: Goal: Risk for impaired skin integrity will decrease Outcome: Progressing

## 2019-11-16 NOTE — Progress Notes (Signed)
PHARMACY NOTE:  ANTIMICROBIAL RENAL DOSAGE ADJUSTMENT  Current antimicrobial regimen includes a mismatch between antimicrobial dosage and estimated renal function.  As per policy approved by the Pharmacy & Therapeutics and Medical Executive Committees, the antimicrobial dosage will be adjusted accordingly.  Current antimicrobial dosage:  Cefazolin 2000 mg IV every 8 hours.  Indication: MSSA bacteremia  Renal Function:   Estimated Creatinine Clearance: 22.4 mL/min (A) (by C-G formula based on SCr of 2.21 mg/dL (H)). []      On intermittent HD, scheduled: []      On CRRT    Antimicrobial dosage has been changed to:  Cefazolin 2000 mg IV every 12 hours  Additional comments: will need to monitor renal function to see if patient's OPAT orders need adjusted at discharge.   Thank you for allowing pharmacy to be a part of this patient's care.  Ramond Craver, [facility] 11/16/2019 9:01 AM

## 2019-11-16 NOTE — Progress Notes (Addendum)
ANTICOAGULATION CONSULT NOTE   Pharmacy Consult for: warfarin dosing Indication: atrial fibrillation  No Known Allergies  Patient Measurements: Height: 5\' 4"  (162.6 cm) Weight: 186 lb 4.6 oz (84.5 kg) IBW/kg (Calculated) : 54.7  Vital Signs: Temp: 97.6 F (36.4 C) (01/14 0429) Temp Source: Oral (01/14 0429) BP: 115/82 (01/14 0429) Pulse Rate: 118 (01/14 0429)  Labs: Recent Labs    11/14/19 0530 11/15/19 0511 11/15/19 1535 11/16/19 0356 11/16/19 0501  HGB  --   --  11.0*  --  11.1*  HCT  --   --  35.1*  --  35.1*  LABPROT 29.7* 32.7*  --   --  42.9*  INR 2.8* 3.2*  --   --  4.5*  CREATININE  --   --   --   --  2.21*  TROPONINIHS  --   --   --  29* 30*    Estimated Creatinine Clearance: 22.4 mL/min (A) (by C-G formula based on SCr of 2.21 mg/dL (H)).   Medical History: Past Medical History:  Diagnosis Date  . Adenomatous polyps    -resected via colonoscopy in 2011  . Anemia   . Anxiety    MR  . Atrial fibrillation ([place])   . CHF (congestive heart failure) ([place])   . Chronic kidney disease   . Degenerative joint disease    status post right total hip arthroplasty  . Dysrhythmia   . GERD (gastroesophageal reflux disease)    -Barrett's esophagus  . Glaucoma   . Glaucoma   . Gout   . Hyperlipidemia   . Hypertension   . Mental retardation   . Peripheral neuropathy 09/12/2019  . Pneumonia   . Premature ventricular contractions   . Restless leg syndrome   . Shortness of breath dyspnea   . Wears dentures   . Wears glasses     Assessment: Pharmacy consulted to dose warfarin for this 79 yo female with atrial fibrillation on chronic anticoagulation with warfarin.   Home dose: 3mg  on Mon-Tues-Wed-Thurs-Fri only; nothing on Sat-Sun.  INR :  INR increased to 4.5 today, supratherapeutic CBC: H/H: 11.1/34.7     platelets 190  Goal of Therapy:  INR 2-3 Monitor platelets by anticoagulation protocol: Yes   Plan:  No coumadin today- Hold Coumadin due to  concerns about GI bleed Daily INR and CBC Monitor for signs/symptoms of bleeding.  Margot Ables, PharmD Clinical Pharmacist 11/16/2019 8:56 AM

## 2019-11-16 NOTE — Progress Notes (Signed)
PROGRESS NOTE    [name]  VQM:[id number] DOB: Mar 05, 1941 DOA: 11/07/2019 PCP: Doree Albee, MD    Brief Narrative:  79 y.o. female with medical history significant for chronic hypoxic respiratory failure in the setting of chronic diastolic heart failure on 3 L continuous supplemental oxygen, paroxysmal atrial fibrillation chronically anticoagulated on warfarin, hypertension, stage III chronic kidney disease with baseline creatinine of 1.4-1.5, developmental delay, who is admitted to [facility] on 11/07/2019 with COVID-19 pneumonia after resenting from home to [facility] emergency department for evaluation of objective fever.   In the setting of history of developmental delay associated with baseline mental status, the following history is obtained via my discussions with the emergency department physician as well as via chart review.  Per the patient's niece, with whom the patient lives, multiple family members to whom the patient has been exposed over the last week have been experiencing new onset rhinitis, rhinorrhea, sore throat, shortness of breath and subjective fever, although niece reports that none of these family members have been tested for Covid at this time.  Subsequent to the above symptoms experienced by patient's family members, the patient reportedly developed an objective fever over the last day, with temperature max at home noted to be 102-103 F.  [name] reportedly exhibited some dry heaving over that time in the absence of any diarrhea or rash.  Per niece, no increase in patient's supplemental oxygen demands relative to her baseline requirement of 3 L continuous nasal cannula.  Patient has undergone no recent traveling.  Past medical history is notable for a history of chronic diastolic heart failure.  Most recent echocardiogram was performed in June 2019 and showed left ventricle associated with normal cavity size and normal wall thickness, LVEF 55 to 60%, no  focal wall motion abnormalities, but showed evidence of diastolic dysfunction as well as mild mitral regurgitation and moderate tricuspid regurgitation.  Outpatient diuretic regimen consists of torsemide 20 mg p.o. twice daily.  While on this diuretic, the patient is also prescribed potassium chloride 20 mEq p.o. twice daily.  However, the patient's niece reports that the patient ran out of her potassium supplementation approximately 1 week ago, and has been unable to subsequently fill this prescription.    Assessment & Plan: 1-sepsis secondary to pneumonia due to COVID-19 virus and a Staph aureus (MSSA) bacteremia --Dyspnea is not worse, currently on 4 L of oxygen via nasal (at home chronically on 2.5-3 L) -Chest x-ray demonstrating bilateral infiltrates -On presentation patient met sepsis criteria; with elevated respiratory rate, elevated heart rate, low-grade temperature, elevated WBCs and also elevated lactic acid. -Sepsis pathophysiology resolved. -Patient has completed remdesivir therapy  -Stopped Solu-Medrol (started 11/08/19)  given concerns about GI bleed .  -continue As needed antipyretics and bronchodilators.  2)MSSA Bacteremia; following ID recommendations patient transitioned to IV cefazolin only.  Will follow infectious disease recommendation for length of treatment (toal of 4 weeks)- last dose 12/06/19 -2D echo suboptimal, but no demonstrating vegetations. -PICC line placed w/o complications. -Staph aureus species resistant to erythromycin and clindamycin.   2-Possible GI bleed--stool Hemoccult positive, INR supratherapeutic , hold Coumadin, continue IV Protonix 40 every 12hr started 11/14/2018 with -Concerns about steroid-induced ulcer -serial H&H  -May need EGD if significant drop in H&H Poor oral intake perpetuate elevated INR -INR was 4.5 down to 4.0 continue to trend consider vitamin K if persistently elevated  3) persistent atrial fibrillation  -CHADsVASC score 5 EF 35 to  40% so avoid Cardizem, Toprol-XL  has been titrated to 200 mg daily, may use IV metoprolol as needed for tachycardia,  -Given staph bacteremia and COVID-19 respiratory infection---tachycardia may persist -Hold Coumadin due to concerns about GI bleed  -Cardiology input appreciated -Okay to try IV amiodarone if tachycardia persist despite above measures   4-Chronic anticoagulation -Follow INR. -Hold Coumadin due to concerns about GI bleed -Poor oral intake perpetuate elevated INR -INR was 4.5 down to 4.0 continue to trend consider vitamin K if persistently elevated  5-Chronic kidney disease, stage 3 -Appears overall stable and at baseline currently. -Per GFR evaluation patient with a stage IIIb chronic kidney disease. -Continue to follow renal function trend. -Pharmacy adjusting medications for renal function.  6-acute systolic heart failure  -Unable to assess diastolic function with atrial fibrillation at this time. -Patient with previous history of diastolic heart failure documentation and normal ejection fraction in 2017.   -Cardiology service has been consulted to assist with medication adjustment/optimization. -Continue to follow daily weights and low-sodium diet -Continue home dose torsemide. -Currently euvolemic and stable. - -No ACE inhibitors, Entresto or ARB or aldactone at this moment given chronic renal failure and slight fluctuation of Cr.  -Further treatment and medication adjustment to be decided by cardiologist at follow-up visit. -EF 35 to 40%, continue Toprol-XL 20 mg daily  7-hyperkalemia-calcium gluconate and Kayexalate given-stop Aldactone, potassium trending down from 6.1 to 5.5  8-chronic respiratory failure -Continue oxygen supplementation -Follow O2 sats and clinical response; wean oxygen supplementation down to baseline as tolerated. -Patient uses 2.5-3 L oxygen supplementation chronically; currently using 3 L. -Continue as needed  bronchodilators.  9-hypothyroidism -Continue Synthroid.  10-physical deconditioning -Physical therapy recommending skilled nursing facility -Will further discuss with [facility] team and family to assess chances of patient returning home with home health services versus skilled nursing facility placement.  11)social/Ethics--- plan of care as already discussed with patient's sister [name] and patient's niece Ms Beau Fanny is a DNR/DNI  DVT prophylaxis: Continue warfarin Code Status: DNR Family Communication: No family at bedside; niece updated over the phone. Disposition Plan: Remains inpatient, continue IV antibiotic (cefazolin), plan is to treat for a total of 4 weeks (currently 22 days pending); blood culture from 11/11/2019 - for 48 hours PICC line placed on 11/13/2019.  Patient has completed remdesivir infusion on 11/12/2019.  -A. fib with RVR rate control challenges persist, may need IV amiodarone drip  Consultants:   ID (Dr. Megan Salon) with automatic counsultation secondary to positive for staph bacteremia  Cardiology service (secondary to acute systolic heart failure).  Procedures:   See below for x-ray reports  Antimicrobials:  Anti-infectives (From admission, onward)   Start     Dose/Rate Route Frequency Ordered Stop   11/16/19 1800  ceFAZolin (ANCEF) IVPB 2g/100 mL premix     2 g 200 mL/hr over 30 Minutes Intravenous Every 12 hours 11/16/19 0849     11/10/19 1600  vancomycin (VANCOREADY) IVPB 1250 mg/250 mL  Status:  Discontinued     1,250 mg 166.7 mL/hr over 90 Minutes Intravenous Every 24 hours 11/09/19 1414 11/10/19 0949   11/09/19 1200  ceFAZolin (ANCEF) IVPB 2g/100 mL premix  Status:  Discontinued     2 g 200 mL/hr over 30 Minutes Intravenous Every 8 hours 11/09/19 1016 11/16/19 0849   11/09/19 1000  remdesivir 100 mg in sodium chloride 0.9 % 100 mL IVPB     100 mg 200 mL/hr over 30 Minutes Intravenous Daily 11/08/19 0752 11/12/19 1005   11/09/19  1000  vancomycin (  VANCOREADY) IVPB 1750 mg/350 mL  Status:  Discontinued     1,750 mg 175 mL/hr over 120 Minutes Intravenous  Once 11/09/19 0939 11/09/19 0940   11/09/19 1000  vancomycin (VANCOREADY) IVPB 2000 mg/400 mL     2,000 mg 200 mL/hr over 120 Minutes Intravenous  Once 11/09/19 0940 11/09/19 1703   11/08/19 0800  remdesivir 200 mg in sodium chloride 0.9% 250 mL IVPB     200 mg 580 mL/hr over 30 Minutes Intravenous Once 11/08/19 0752 11/08/19 1256       Subjective:  -Tachycardia, poor appetite and shortness of breath persist -Concerns about GI bleed with heme positive stool  Objective: Vitals:   11/15/19 2223 11/16/19 0429 11/16/19 0500 11/16/19 1502  BP:  115/82  127/70  Pulse:  (!) 118  (!) 117  Resp:  (!) 24  19  Temp:  97.6 F (36.4 C)  98 F (36.7 C)  TempSrc:  Oral  Axillary  SpO2: 97% 96%  98%  Weight:   84.5 kg   Height:        Intake/Output Summary (Last 24 hours) at 11/16/2019 1805 Last data filed at 11/16/2019 0800 Gross per 24 hour  Intake 180 ml  Output --  Net 180 ml   Filed Weights   11/14/19 0500 11/15/19 0534 11/16/19 0500  Weight: 82.6 kg 84.3 kg 84.5 kg    Examination: General exam: Alert, awake, oriented x 2; not very cooperative at times  nose- [place] 4L/min Respiratory system: Decreased breath sounds at the bases, positive scattered rhonchi it.    Cardiovascular system: Irregular irregular, tachycardic gastrointestinal system: Abdomen is nondistended, soft and nontender. . Normal bowel sounds heard. Central nervous system: Alert and oriented.  Generalized weakness, no focal neurological deficits. Extremities: No C/C/E, +pedal pulses Skin: No rashes, lesions or ulcers Psychiatry: Anxious to go home; no hallucinations, underlying cognitive and memory deficits  Data Reviewed: I have personally reviewed following labs and imaging studies  CBC: Recent Labs  Lab 11/10/19 0841 11/10/19 0841 11/11/19 0639 11/12/19 0609 11/13/19 0529  11/15/19 1535 11/16/19 0501  WBC 10.7*  --  15.9* 12.2* 10.5  --   --   NEUTROABS 9.8*  --  14.7* 11.2* 9.5*  --   --   HGB 11.1*   < > 12.7 11.7* 12.4 11.0* 11.1*  HCT 34.7*   < > 39.3 36.2 38.4 35.1* 35.1*  MCV 103.3*  --  104.0* 102.0* 102.7*  --   --   PLT 121*  --  134* 147* 190  --   --    < > = values in this interval not displayed.   Basic Metabolic Panel: Recent Labs  Lab 11/10/19 0841 11/10/19 0841 11/11/19 0639 11/12/19 0609 11/13/19 0529 11/16/19 0501 11/16/19 1042  NA 135   < > 136 137 138 134* 135  K 2.5*   < > 3.1* 3.9 4.0 6.1* 5.5*  CL 89*   < > 88* 89* 90* 91* 93*  CO2 33*   < > 35* 33* 34* 30 31  GLUCOSE 142*   < > 161* 125* 158* 134* 156*  BUN 28*   < > 29* 31* 32* 53* 52*  CREATININE 1.49*   < > 1.62* 1.51* 1.51* 2.21* 2.22*  CALCIUM 8.3*   < > 8.9 8.8* 8.9 8.8* 8.7*  MG 1.8  --  1.7 1.7 1.7  --   --    < > = values in this interval not displayed.   GFR:  Estimated Creatinine Clearance: 22.3 mL/min (A) (by C-G formula based on SCr of 2.22 mg/dL (H)).   Liver Function Tests: Recent Labs  Lab 11/10/19 0841 11/11/19 0639 11/12/19 0609 11/13/19 0529  AST 32 _0 ALT _1 ALKPHOS 52 60 58 67  BILITOT 0.6 0.7 0.5 0.7  PROT 6.0* 6.6 6.0* 6.1*  ALBUMIN 2.6* 2.8* 2.5* 2.7*   Coagulation Profile: Recent Labs  Lab 11/13/19 0529 11/14/19 0530 11/15/19 0511 11/16/19 0501 11/16/19 1517  INR 3.0* 2.8* 3.2* 4.5* 4.0*   Anemia Panel: No results for input(s): VITAMINB12, FOLATE, FERRITIN, TIBC, IRON, RETICCTPCT in the last 72 hours. Urine analysis:    Component Value Date/Time   COLORURINE YELLOW 11/07/2019 1454   APPEARANCEUR CLEAR 11/07/2019 1454   LABSPEC 1.010 11/07/2019 1454   PHURINE 5.0 11/07/2019 1454   GLUCOSEU NEGATIVE 11/07/2019 1454   HGBUR SMALL (A) 11/07/2019 1454   BILIRUBINUR NEGATIVE 11/07/2019 1454   KETONESUR NEGATIVE 11/07/2019 1454   PROTEINUR NEGATIVE 11/07/2019 1454   NITRITE NEGATIVE 11/07/2019 1454    LEUKOCYTESUR NEGATIVE 11/07/2019 1454    Recent Results (from the past 240 hour(s))  Urine culture     Status: None   Collection Time: 11/07/19  2:54 PM   Specimen: Urine, Clean Catch  Result Value Ref Range Status   Specimen Description   Final    URINE, CLEAN CATCH Performed at [facility], [address], [place], [place] [address]    Special Requests   Final    NONE Performed at [facility], [address], [place], [place] [address]    Culture   Final    NO GROWTH Performed at [facility], [place] [address], [place], [place] [address]    Report Status 11/08/2019 FINAL  Final  Culture, blood (routine x 2)     Status: None   Collection Time: 11/07/19  3:46 PM   Specimen: BLOOD RIGHT HAND  Result Value Ref Range Status   Specimen Description BLOOD RIGHT HAND  Final   Special Requests   Final    BOTTLES DRAWN AEROBIC ONLY Blood Culture adequate volume   Culture   Final    NO GROWTH 6 DAYS Performed at [facility], [address], [place], [place] [address]    Report Status 11/13/2019 FINAL  Final  Culture, blood (routine x 2)     Status: Abnormal   Collection Time: 11/07/19 10:10 PM   Specimen: BLOOD RIGHT HAND  Result Value Ref Range Status   Specimen Description   Final    BLOOD RIGHT HAND Performed at [facility], [address] [place], [place] [address]    Special Requests   Final    BOTTLES DRAWN AEROBIC AND ANAEROBIC Blood Culture adequate volume Performed at [facility], [address], [place], [place] [address]    Culture  Setup Time   Final    GRAM POSITIVE COCCI AEROBIC BOTTLE Gram Stain Report Called to,Read Back By and Verified With: BENGTSON, P ON 11/08/19 AT 1750 BY LOY,C  PERFORMED AT [facility] Performed at [facility], [address] [place], [place] [address]    Culture STAPHYLOCOCCUS AUREUS (A)  Final   Report Status 11/10/2019 FINAL  Final   Organism ID, Bacteria STAPHYLOCOCCUS AUREUS  Final      Susceptibility   Staphylococcus aureus - MIC*     CIPROFLOXACIN <=0.5 SENSITIVE Sensitive     ERYTHROMYCIN >=8 RESISTANT Resistant     GENTAMICIN <=0.5 SENSITIVE Sensitive  OXACILLIN <=0.25 SENSITIVE Sensitive     TETRACYCLINE <=1 SENSITIVE Sensitive     VANCOMYCIN <=0.5 SENSITIVE Sensitive     TRIMETH/SULFA <=10 SENSITIVE Sensitive     CLINDAMYCIN RESISTANT Resistant     RIFAMPIN <=0.5 SENSITIVE Sensitive     Inducible Clindamycin POSITIVE Resistant     * STAPHYLOCOCCUS AUREUS  Culture, blood (routine x 2)     Status: Abnormal   Collection Time: 11/09/19 12:30 PM   Specimen: BLOOD RIGHT WRIST  Result Value Ref Range Status   Specimen Description   Final    BLOOD RIGHT WRIST Performed at [facility], [address] [place], [place] [address]    Special Requests   Final    BOTTLES DRAWN AEROBIC AND ANAEROBIC Blood Culture adequate volume Performed at [facility], [address], [place], [place] [address]    Culture  Setup Time   Final    GRAM POSITIVE COCCI IN BOTH AEROBIC AND ANAEROBIC BOTTLES Gram Stain Report Called to,Read Back By and Verified With: DEAN T. AT 0836A ON [id number] BY THOMPSON S. Performed at [facility], [address], [place], [place] [address]    Culture (A)  Final    STAPHYLOCOCCUS AUREUS SUSCEPTIBILITIES PERFORMED ON PREVIOUS CULTURE WITHIN THE LAST 5 DAYS. Performed at [facility], [place] [address] [place], [place] [address]    Report Status 11/12/2019 FINAL  Final  Culture, blood (routine x 2)     Status: None   Collection Time: 11/09/19 12:38 PM   Specimen: BLOOD RIGHT HAND  Result Value Ref Range Status   Specimen Description BLOOD RIGHT HAND  Final   Special Requests   Final    BOTTLES DRAWN AEROBIC AND ANAEROBIC Blood Culture adequate volume   Culture   Final    NO GROWTH 6 DAYS Performed at [facility], [address], [place], [place] [address]    Report Status 11/15/2019 FINAL  Final  Culture, blood (routine x 2)     Status: None   Collection Time: 11/11/19 11:18 AM    Specimen: Right Antecubital; Blood  Result Value Ref Range Status   Specimen Description   Final    RIGHT ANTECUBITAL BOTTLES DRAWN AEROBIC AND ANAEROBIC   Special Requests Blood Culture adequate volume  Final   Culture   Final    NO GROWTH 5 DAYS Performed at [facility], [address], [place], [place] [address]    Report Status 11/16/2019 FINAL  Final  Culture, blood (routine x 2)     Status: None   Collection Time: 11/11/19 11:27 AM   Specimen: BLOOD RIGHT ARM  Result Value Ref Range Status   Specimen Description   Final    BLOOD RIGHT ARM BOTTLES DRAWN AEROBIC AND ANAEROBIC   Special Requests Blood Culture adequate volume  Final   Culture   Final    NO GROWTH 5 DAYS Performed at [facility], [address] [place],  [address]    Report Status 11/16/2019 FINAL  Final  MRSA PCR Screening     Status: None   Collection Time: 11/16/19  3:01 PM   Specimen: Nasal Mucosa; Nasopharyngeal  Result Value Ref Range Status   MRSA by PCR NEGATIVE NEGATIVE Final    Comment:        The GeneXpert MRSA Assay (FDA approved for NASAL specimens only), is one component of a comprehensive MRSA colonization surveillance program. It is not intended to diagnose MRSA infection nor to guide or monitor treatment for  MRSA infections. Performed at [facility], [address], [place], [place] [address]      Radiology Studies: DG ABD ACUTE 2+V W 1V CHEST  Result Date: 11/15/2019 CLINICAL DATA:  Abdomen pain EXAM: DG ABDOMEN ACUTE W/ 1V CHEST COMPARISON:  11/13/2019 chest x-ray, 11/07/2019 FINDINGS: Single view chest demonstrates cardiac enlargement. Large hiatal hernia. Aortic atherosclerosis. Patchy foci of airspace disease at the periphery of the lung bases with overall improved aeration. Supine and upright views of the abdomen demonstrate no free air beneath the diaphragm. Gas-filled loops of borderline to mildly distended central small bowel with relative absence of distal and colon  bowel gas. Postsurgical changes of both hips IMPRESSION: 1. Small amount of airspace disease at the periphery of the right base with overall improved aeration since 11/13/2019. Enlarged cardiac silhouette. Large hiatal hernia 2. Air-filled borderline to mildly distended central small bowel with relative absence of colon gas, mild or developing bowel obstruction is considered. Electronically Signed   By: Donavan Foil M.D.   On: 11/15/2019 18:56    Scheduled Meds: . vitamin C  500 mg Oral Daily  . Chlorhexidine Gluconate Cloth  6 each Topical Daily  . latanoprost  1 drop Both Eyes QHS  . mouth rinse  15 mL Mouth Rinse BID  . metoprolol succinate  200 mg Oral Daily  . pantoprazole (PROTONIX) IV  40 mg Intravenous Q12H  . saccharomyces boulardii  250 mg Oral BID  . sodium chloride flush  10-40 mL Intracatheter Q12H  . sodium chloride flush  3 mL Intravenous Q12H  . zinc sulfate  220 mg Oral Daily   Continuous Infusions: .  ceFAZolin (ANCEF) IV 2 g (11/16/19 1718)     LOS: 9 days    Roxan Hockey, MD [facility]  11/16/2019, 6:05 PM

## 2019-11-17 DIAGNOSIS — I4891 Unspecified atrial fibrillation: Secondary | ICD-10-CM

## 2019-11-17 DIAGNOSIS — I5021 Acute systolic (congestive) heart failure: Secondary | ICD-10-CM

## 2019-11-17 LAB — CBC
HCT: 28.1 % — ABNORMAL LOW (ref 36.0–46.0)
Hemoglobin: 9 g/dL — ABNORMAL LOW (ref 12.0–15.0)
MCH: 33.5 pg (ref 26.0–34.0)
MCHC: 32 g/dL (ref 30.0–36.0)
MCV: 104.5 fL — ABNORMAL HIGH (ref 80.0–100.0)
Platelets: 222 10*3/uL (ref 150–400)
RBC: 2.69 MIL/uL — ABNORMAL LOW (ref 3.87–5.11)
RDW: 16.2 % — ABNORMAL HIGH (ref 11.5–15.5)
WBC: 17.4 10*3/uL — ABNORMAL HIGH (ref 4.0–10.5)
nRBC: 0.8 % — ABNORMAL HIGH (ref 0.0–0.2)

## 2019-11-17 LAB — RENAL FUNCTION PANEL
Albumin: 2.5 g/dL — ABNORMAL LOW (ref 3.5–5.0)
Anion gap: 9 (ref 5–15)
BUN: 42 mg/dL — ABNORMAL HIGH (ref 8–23)
CO2: 35 mmol/L — ABNORMAL HIGH (ref 22–32)
Calcium: 8.4 mg/dL — ABNORMAL LOW (ref 8.9–10.3)
Chloride: 92 mmol/L — ABNORMAL LOW (ref 98–111)
Creatinine, Ser: 1.87 mg/dL — ABNORMAL HIGH (ref 0.44–1.00)
GFR calc Af Amer: 30 mL/min — ABNORMAL LOW (ref >60)
GFR calc non Af Amer: 25 mL/min — ABNORMAL LOW (ref >60)
Glucose, Bld: 98 mg/dL (ref 70–99)
Phosphorus: 2.8 mg/dL (ref 2.5–4.6)
Potassium: 4 mmol/L (ref 3.5–5.1)
Sodium: 136 mmol/L (ref 135–145)

## 2019-11-17 LAB — PROTIME-INR
INR: 3.9 — ABNORMAL HIGH (ref 0.8–1.2)
Prothrombin Time: 38 seconds — ABNORMAL HIGH (ref 11.4–15.2)

## 2019-11-17 MED ORDER — METOPROLOL TARTRATE 25 MG PO TABS
25.0000 mg | ORAL_TABLET | Freq: Once | ORAL | Status: AC
  Administered 2019-11-17: 20:00:00 25 mg via ORAL
  Filled 2019-11-17: qty 1

## 2019-11-17 MED ORDER — METOPROLOL SUCCINATE ER 50 MG PO TB24
225.0000 mg | ORAL_TABLET | Freq: Every day | ORAL | Status: DC
  Administered 2019-11-18: 225 mg via ORAL
  Filled 2019-11-17: qty 1

## 2019-11-17 MED ORDER — METOPROLOL TARTRATE 25 MG PO TABS: 25.0000 mg | ORAL_TABLET | Freq: Two times a day (BID) | ORAL | Status: DC

## 2019-11-17 NOTE — Care Management Important Message (Signed)
Important Message  Patient Details  Name: [name] MRN: [id number] Date of Birth: 04-16-1941   Medicare Important Message Given:  Yes(RN will deliver letter due to airborne and contact precautions)     Tommy Medal 11/17/2019, 4:16 PM

## 2019-11-17 NOTE — Progress Notes (Signed)
PROGRESS NOTE    [name]  ZOX:[id number] DOB: 1941-02-07 DOA: 11/07/2019 PCP: Doree Albee, MD    Brief Narrative:  79 y.o. female with medical history significant for chronic hypoxic respiratory failure in the setting of chronic diastolic heart failure on 3 L continuous supplemental oxygen, paroxysmal atrial fibrillation chronically anticoagulated on warfarin, hypertension, stage III chronic kidney disease with baseline creatinine of 1.4-1.5, developmental delay, who is admitted to [facility] on 11/07/2019 with COVID-19 pneumonia after resenting from home to [facility] emergency department for evaluation of objective fever.   In the setting of history of developmental delay associated with baseline mental status, the following history is obtained via my discussions with the emergency department physician as well as via chart review.  Per the patient's niece, with whom the patient lives, multiple family members to whom the patient has been exposed over the last week have been experiencing new onset rhinitis, rhinorrhea, sore throat, shortness of breath and subjective fever, although niece reports that none of these family members have been tested for Covid at this time.  Subsequent to the above symptoms experienced by patient's family members, the patient reportedly developed an objective fever over the last day, with temperature max at home noted to be 102-103 F.  [name] reportedly exhibited some dry heaving over that time in the absence of any diarrhea or rash.  Per niece, no increase in patient's supplemental oxygen demands relative to her baseline requirement of 3 L continuous nasal cannula.  Patient has undergone no recent traveling.  Past medical history is notable for a history of chronic diastolic heart failure.  Most recent echocardiogram was performed in June 2019 and showed left ventricle associated with normal cavity size and normal wall thickness, LVEF 55 to 60%, no  focal wall motion abnormalities, but showed evidence of diastolic dysfunction as well as mild mitral regurgitation and moderate tricuspid regurgitation.  Outpatient diuretic regimen consists of torsemide 20 mg p.o. twice daily.  While on this diuretic, the patient is also prescribed potassium chloride 20 mEq p.o. twice daily.  However, the patient's niece reports that the patient ran out of her potassium supplementation approximately 1 week ago, and has been unable to subsequently fill this prescription.    Assessment & Plan: 1-sepsis secondary to pneumonia due to COVID-19 virus and a Staph aureus (MSSA) bacteremia --Dyspnea is not worse, currently on 4 L of oxygen via nasal (at home chronically on 2.5-3 L) -Chest x-ray demonstrating bilateral infiltrates -On presentation patient met sepsis criteria; with elevated respiratory rate, elevated heart rate, low-grade temperature, elevated WBCs and also elevated lactic acid. -Sepsis pathophysiology resolved. -Patient has completed remdesivir therapy  -Stopped Solu-Medrol (started 11/08/19)  given concerns about GI bleed .  -continue As needed antipyretics and bronchodilators.  2)MSSA Bacteremia; following ID recommendations patient transitioned to IV cefazolin only.  Will follow infectious disease recommendation for length of treatment (toal of 4 weeks)- last dose 12/06/19 -2D echo suboptimal, but no demonstrating vegetations. -PICC line placed w/o complications. -Staph aureus species resistant to erythromycin and clindamycin.   2-Possible GI bleed--stool Hemoccult positive, INR supratherapeutic , hold Coumadin, continue IV Protonix 40 every 12hr started 11/14/2018 with -Concerns about steroid-induced ulcer -serial H&H  (hgb is 9.0 -May need EGD if significant drop in H&H Poor oral intake perpetuate elevated INR -INR was 4.5 down to 3.9 - continue to trend consider vitamin K if persistently elevated  3)Persistent atrial fibrillation  -CHADsVASC  score 5 EF 35 to 40% so  avoid Cardizem, Toprol-XL has been titrated to 200 mg daily,  - add scheduled Metoprolol 25 mg bid in addition to Toprol XL 200 mg daily for better rate control may use IV metoprolol as needed for tachycardia,  -Given staph bacteremia and COVID-19 respiratory infection---tachycardia may persist -Hold Coumadin due to concerns about GI bleed  -Cardiology input appreciated -Okay to try IV amiodarone if tachycardia persist despite above measures   4-Chronic anticoagulation -Follow INR. -Hold Coumadin due to concerns about GI bleed -Poor oral intake perpetuate elevated INR -INR was 4.5 down to 4.0 continue to trend consider vitamin K if persistently elevated  5-Chronic kidney disease, stage 3 -Appears overall stable and at baseline currently. -Per GFR evaluation patient with a stage IIIb chronic kidney disease. -Continue to follow renal function trend. -Pharmacy adjusting medications for renal function.  6-acute systolic heart failure  -Unable to assess diastolic function with atrial fibrillation at this time. -Patient with previous history of diastolic heart failure documentation and normal ejection fraction in 2017.   -Cardiology service has been consulted to assist with medication adjustment/optimization. -Continue to follow daily weights and low-sodium diet -Continue home dose torsemide. -Currently euvolemic and stable. - -No ACE inhibitors, Entresto or ARB or aldactone at this moment given chronic renal failure and slight fluctuation of Cr.  -Further treatment and medication adjustment to be decided by cardiologist at follow-up visit. -EF 35 to 40%, continue Toprol-XL 20 mg daily  7-hyperkalemia-calcium gluconate and Kayexalate given-stop Aldactone, potassium trending down from 6.1 to 5.5  8-chronic respiratory failure -Continue oxygen supplementation -Follow O2 sats and clinical response; wean oxygen supplementation down to baseline as  tolerated. -Patient uses 2.5-3 L oxygen supplementation chronically; currently using 3 L. -Be judicious with bronchodilators given A. fib with RVR  9-hypothyroidism -Continue Synthroid.  10-physical deconditioning -Physical therapy recommending skilled nursing facility -Patient and family at this time declines SNF rehab, they would rather have patient return home with Home health services   11)social/Ethics--- plan of care as already discussed with patient's sister [name] and patient's niece Ms Beau Fanny is a DNR/DNI  DVT prophylaxis: Continue warfarin Code Status: DNR Family Communication: No family at bedside; niece updated over the phone. Disposition Plan: Remains inpatient, continue IV antibiotic (cefazolin), plan is to treat for a total of 4 weeks (currently 22 days pending); blood culture from 11/11/2019 - for 48 hours PICC line placed on 11/13/2019.  Patient has completed remdesivir infusion on 11/12/2019.  Patient and family at this time declines SNF rehab, they would rather have patient return home with Home health services  -Barriers to discharge remains concerns about possible GI bleed and A. fib rate control -A. fib with RVR rate control challenges persist, may need IV amiodarone drip  Consultants:   ID (Dr. Megan Salon) with automatic counsultation secondary to positive for staph bacteremia  Cardiology service (secondary to acute systolic heart failure).  Procedures:   See below for x-ray reports  Antimicrobials:  Anti-infectives (From admission, onward)   Start     Dose/Rate Route Frequency Ordered Stop   11/16/19 1800  ceFAZolin (ANCEF) IVPB 2g/100 mL premix     2 g 200 mL/hr over 30 Minutes Intravenous Every 12 hours 11/16/19 0849     11/10/19 1600  vancomycin (VANCOREADY) IVPB 1250 mg/250 mL  Status:  Discontinued     1,250 mg 166.7 mL/hr over 90 Minutes Intravenous Every 24 hours 11/09/19 1414 11/10/19 0949   11/09/19 1200  ceFAZolin (ANCEF)  IVPB 2g/100 mL premix  Status:  Discontinued     2 g 200 mL/hr over 30 Minutes Intravenous Every 8 hours 11/09/19 1016 11/16/19 0849   11/09/19 1000  remdesivir 100 mg in sodium chloride 0.9 % 100 mL IVPB     100 mg 200 mL/hr over 30 Minutes Intravenous Daily 11/08/19 0752 11/12/19 1005   11/09/19 1000  vancomycin (VANCOREADY) IVPB 1750 mg/350 mL  Status:  Discontinued     1,750 mg 175 mL/hr over 120 Minutes Intravenous  Once 11/09/19 0939 11/09/19 0940   11/09/19 1000  vancomycin (VANCOREADY) IVPB 2000 mg/400 mL     2,000 mg 200 mL/hr over 120 Minutes Intravenous  Once 11/09/19 0940 11/09/19 1703   11/08/19 0800  remdesivir 200 mg in sodium chloride 0.9% 250 mL IVPB     200 mg 580 mL/hr over 30 Minutes Intravenous Once 11/08/19 0752 11/08/19 1256       Subjective:  -Oral intake remains poor -No frank bleeding, stool was previously Hemoccult positive -Hypoxia, dyspnea and tachycardia persist Objective: Vitals:   11/17/19 1400 11/17/19 1500 11/17/19 1600 11/17/19 1645  BP: 109/67 (!) 106/94  125/69  Pulse: (!) 101 (!) 106 (!) 106 100  Resp: (!) 23 (!) 22 (!) 23 18  Temp:   98.3 F (36.8 C)   TempSrc:   Oral   SpO2: 91% 94% 95% 95%  Weight:      Height:        Intake/Output Summary (Last 24 hours) at 11/17/2019 1755 Last data filed at 11/17/2019 1416 Gross per 24 hour  Intake 110 ml  Output 400 ml  Net -290 ml   Filed Weights   11/15/19 0534 11/16/19 0500 11/17/19 0500  Weight: 84.3 kg 84.5 kg 84.8 kg    Examination: General exam: Alert, awake, oriented x 2; not very cooperative at times  nose- [place] 3L/min Respiratory system: Decreased breath sounds at the bases, positive scattered rhonchi it.    Cardiovascular system: Irregular irregular, tachycardic gastrointestinal system: Abdomen is nondistended, soft and nontender. . Normal bowel sounds heard. Central nervous system: Alert and oriented.  Generalized weakness, no focal neurological deficits. Extremities: No  C/C/E, +pedal pulses Skin: No rashes, lesions or ulcers Psychiatry: Anxious to go home; no hallucinations, underlying cognitive and memory deficits  Data Reviewed: I have personally reviewed following labs and imaging studies  CBC: Recent Labs  Lab 11/11/19 0639 11/11/19 0639 11/12/19 0609 11/12/19 0609 11/13/19 0529 11/15/19 1535 11/16/19 0501 11/16/19 1736 11/17/19 0256  WBC 15.9*  --  12.2*  --  10.5  --   --   --  17.4*  NEUTROABS 14.7*  --  11.2*  --  9.5*  --   --   --   --   HGB 12.7   < > 11.7*   < > 12.4 11.0* 11.1* 9.6* 9.0*  HCT 39.3   < > 36.2   < > 38.4 35.1* 35.1* 29.5* 28.1*  MCV 104.0*  --  102.0*  --  102.7*  --   --   --  104.5*  PLT 134*  --  147*  --  190  --   --   --  222   < > = values in this interval not displayed.   Basic Metabolic Panel: Recent Labs  Lab 11/11/19 0639 11/11/19 0639 11/12/19 0609 11/13/19 0529 11/16/19 0501 11/16/19 1042 11/17/19 0256  NA 136   < > 137 138 134* 135 136  K 3.1*   < > 3.9 4.0 6.1* 5.5* 4.0  CL 88*   < > 89* 90* 91* 93* 92*  CO2 35*   < > 33* 34* 30 31 35*  GLUCOSE 161*   < > 125* 158* 134* 156* 98  BUN 29*   < > 31* 32* 53* 52* 42*  CREATININE 1.62*   < > 1.51* 1.51* 2.21* 2.22* 1.87*  CALCIUM 8.9   < > 8.8* 8.9 8.8* 8.7* 8.4*  MG 1.7  --  1.7 1.7  --   --   --   PHOS  --   --   --   --   --   --  2.8   < > = values in this interval not displayed.   GFR: Estimated Creatinine Clearance: 26.5 mL/min (A) (by C-G formula based on SCr of 1.87 mg/dL (H)).   Liver Function Tests: Recent Labs  Lab 11/11/19 0639 11/12/19 0609 11/13/19 0529 11/17/19 0256  AST 24 25 29   --   ALT 9 7 5   --   ALKPHOS 60 58 67  --   BILITOT 0.7 0.5 0.7  --   PROT 6.6 6.0* 6.1*  --   ALBUMIN 2.8* 2.5* 2.7* 2.5*   Coagulation Profile: Recent Labs  Lab 11/14/19 0530 11/15/19 0511 11/16/19 0501 11/16/19 1517 11/17/19 0256  INR 2.8* 3.2* 4.5* 4.0* 3.9*   Anemia Panel: No results for input(s): VITAMINB12, FOLATE,  FERRITIN, TIBC, IRON, RETICCTPCT in the last 72 hours. Urine analysis:    Component Value Date/Time   COLORURINE YELLOW 11/07/2019 1454   APPEARANCEUR CLEAR 11/07/2019 1454   LABSPEC 1.010 11/07/2019 1454   PHURINE 5.0 11/07/2019 1454   GLUCOSEU NEGATIVE 11/07/2019 1454   HGBUR SMALL (A) 11/07/2019 1454   BILIRUBINUR NEGATIVE 11/07/2019 1454   KETONESUR NEGATIVE 11/07/2019 1454   PROTEINUR NEGATIVE 11/07/2019 1454   NITRITE NEGATIVE 11/07/2019 1454   LEUKOCYTESUR NEGATIVE 11/07/2019 1454    Recent Results (from the past 240 hour(s))  Culture, blood (routine x 2)     Status: Abnormal   Collection Time: 11/07/19 10:10 PM   Specimen: BLOOD RIGHT HAND  Result Value Ref Range Status   Specimen Description   Final    BLOOD RIGHT HAND Performed at [facility], [address] [place], [place] [address]    Special Requests   Final    BOTTLES DRAWN AEROBIC AND ANAEROBIC Blood Culture adequate volume Performed at [facility], [address] [place], [place] [address]    Culture  Setup Time   Final    GRAM POSITIVE COCCI AEROBIC BOTTLE Gram Stain Report Called to,Read Back By and Verified With: BENGTSON, P ON 11/08/19 AT 1750 BY LOY,C  PERFORMED AT [facility] Performed at [facility], [address] [place], [place] [address]    Culture STAPHYLOCOCCUS AUREUS (A)  Final   Report Status 11/10/2019 FINAL  Final   Organism ID, Bacteria STAPHYLOCOCCUS AUREUS  Final      Susceptibility   Staphylococcus aureus - MIC*    CIPROFLOXACIN <=0.5 SENSITIVE Sensitive     ERYTHROMYCIN >=8 RESISTANT Resistant     GENTAMICIN <=0.5 SENSITIVE Sensitive     OXACILLIN <=0.25 SENSITIVE Sensitive     TETRACYCLINE <=1 SENSITIVE Sensitive     VANCOMYCIN <=0.5 SENSITIVE Sensitive     TRIMETH/SULFA <=10 SENSITIVE Sensitive     CLINDAMYCIN RESISTANT Resistant     RIFAMPIN <=0.5 SENSITIVE Sensitive     Inducible Clindamycin POSITIVE Resistant     * STAPHYLOCOCCUS AUREUS  Culture, blood (routine x 2)  Status:  Abnormal   Collection Time: 11/09/19 12:30 PM   Specimen: BLOOD RIGHT WRIST  Result Value Ref Range Status   Specimen Description   Final    BLOOD RIGHT WRIST Performed at [facility], [address] [place], [place] [address]    Special Requests   Final    BOTTLES DRAWN AEROBIC AND ANAEROBIC Blood Culture adequate volume Performed at [facility], [address] [place], [place] [address]    Culture  Setup Time   Final    GRAM POSITIVE COCCI IN BOTH AEROBIC AND ANAEROBIC BOTTLES Gram Stain Report Called to,Read Back By and Verified With: DEAN T. AT [id number] ON [id number] BY THOMPSON S. Performed at [facility], [address] [place], [place] [address]    Culture (A)  Final    STAPHYLOCOCCUS AUREUS SUSCEPTIBILITIES PERFORMED ON PREVIOUS CULTURE WITHIN THE LAST 5 DAYS. Performed at [facility], [place] [address], [place], [place] [address]    Report Status 11/12/2019 FINAL  Final  Culture, blood (routine x 2)     Status: None   Collection Time: 11/09/19 12:38 PM   Specimen: BLOOD RIGHT HAND  Result Value Ref Range Status   Specimen Description BLOOD RIGHT HAND  Final   Special Requests   Final    BOTTLES DRAWN AEROBIC AND ANAEROBIC Blood Culture adequate volume   Culture   Final    NO GROWTH 6 DAYS Performed at [facility], [address] [place], [place] [address]    Report Status 11/15/2019 FINAL  Final  Culture, blood (routine x 2)     Status: None   Collection Time: 11/11/19 11:18 AM   Specimen: Right Antecubital; Blood  Result Value Ref Range Status   Specimen Description   Final    RIGHT ANTECUBITAL BOTTLES DRAWN AEROBIC AND ANAEROBIC   Special Requests Blood Culture adequate volume  Final   Culture   Final    NO GROWTH 5 DAYS Performed at [facility], [address], [place], [place] [address]    Report Status 11/16/2019 FINAL  Final  Culture, blood (routine x 2)     Status: None   Collection Time: 11/11/19 11:27 AM   Specimen: BLOOD RIGHT ARM  Result Value  Ref Range Status   Specimen Description   Final    BLOOD RIGHT ARM BOTTLES DRAWN AEROBIC AND ANAEROBIC   Special Requests Blood Culture adequate volume  Final   Culture   Final    NO GROWTH 5 DAYS Performed at [facility], [address], [place], [place] [address]    Report Status 11/16/2019 FINAL  Final  MRSA PCR Screening     Status: None   Collection Time: 11/16/19  3:01 PM   Specimen: Nasal Mucosa; Nasopharyngeal  Result Value Ref Range Status   MRSA by PCR NEGATIVE NEGATIVE Final    Comment:        The GeneXpert MRSA Assay (FDA approved for NASAL specimens only), is one component of a comprehensive MRSA colonization surveillance program. It is not intended to diagnose MRSA infection nor to guide or monitor treatment for MRSA infections. Performed at [facility], [address], [place], [place] [address]      Radiology Studies: DG ABD ACUTE 2+V W 1V CHEST  Result Date: 11/15/2019 CLINICAL DATA:  Abdomen pain EXAM: DG ABDOMEN ACUTE W/ 1V CHEST COMPARISON:  11/13/2019 chest x-ray, 11/07/2019 FINDINGS: Single view chest demonstrates cardiac enlargement. Large hiatal hernia. Aortic atherosclerosis. Patchy foci of airspace disease at  the periphery of the lung bases with overall improved aeration. Supine and upright views of the abdomen demonstrate no free air beneath the diaphragm. Gas-filled loops of borderline to mildly distended central small bowel with relative absence of distal and colon bowel gas. Postsurgical changes of both hips IMPRESSION: 1. Small amount of airspace disease at the periphery of the right base with overall improved aeration since 11/13/2019. Enlarged cardiac silhouette. Large hiatal hernia 2. Air-filled borderline to mildly distended central small bowel with relative absence of colon gas, mild or developing bowel obstruction is considered. Electronically Signed   By: Donavan Foil M.D.   On: 11/15/2019 18:56    Scheduled Meds: . vitamin C  500 mg Oral  Daily  . Chlorhexidine Gluconate Cloth  6 each Topical Daily  . latanoprost  1 drop Both Eyes QHS  . mouth rinse  15 mL Mouth Rinse BID  . metoprolol succinate  200 mg Oral Daily  . metoprolol tartrate  25 mg Oral BID  . pantoprazole (PROTONIX) IV  40 mg Intravenous Q12H  . saccharomyces boulardii  250 mg Oral BID  . sodium chloride flush  10-40 mL Intracatheter Q12H  . sodium chloride flush  3 mL Intravenous Q12H  . zinc sulfate  220 mg Oral Daily   Continuous Infusions: .  ceFAZolin (ANCEF) IV 2 g (11/17/19 1737)    LOS: 10 days   Roxan Hockey, MD [facility]  11/17/2019, 5:55 PM

## 2019-11-17 NOTE — Progress Notes (Signed)
Progress Note  Patient Name: [name] Date of Encounter: 11/17/2019  Primary Cardiologist: Kate Sable, MD   Subjective   No acute issues overnight Inpatient Medications    Scheduled Meds: . vitamin C  500 mg Oral Daily  . Chlorhexidine Gluconate Cloth  6 each Topical Daily  . latanoprost  1 drop Both Eyes QHS  . mouth rinse  15 mL Mouth Rinse BID  . metoprolol succinate  200 mg Oral Daily  . pantoprazole (PROTONIX) IV  40 mg Intravenous Q12H  . saccharomyces boulardii  250 mg Oral BID  . sodium chloride flush  10-40 mL Intracatheter Q12H  . sodium chloride flush  3 mL Intravenous Q12H  . zinc sulfate  220 mg Oral Daily   Continuous Infusions: .  ceFAZolin (ANCEF) IV 2 g (11/17/19 0535)   PRN Meds: acetaminophen **OR** acetaminophen, alum & mag hydroxide-simeth, diphenoxylate-atropine, guaiFENesin-dextromethorphan, Ipratropium-Albuterol, LORazepam, metoprolol tartrate, ondansetron (ZOFRAN) IV, promethazine, sodium chloride flush   Vital Signs    Vitals:   11/17/19 0330 11/17/19 0400 11/17/19 0500 11/17/19 0600  BP: 117/76 (!) 92/55  130/80  Pulse: (!) 116 (!) 114  (!) 112  Resp: 20 (!) 24  18  Temp:  98 F (36.7 C)    TempSrc:  Axillary    SpO2: 94% 95%  (!) 89%  Weight:   84.8 kg   Height:        Intake/Output Summary (Last 24 hours) at 11/17/2019 0824 Last data filed at 11/16/2019 2130 Gross per 24 hour  Intake 110 ml  Output -  Net 110 ml   Last 3 Weights 11/17/2019 11/16/2019 11/15/2019  Weight (lbs) 186 lb 15.2 oz 186 lb 4.6 oz 185 lb 13.6 oz  Weight (kg) 84.8 kg 84.5 kg 84.3 kg      Telemetry    afib variable rates- Personally Reviewed  ECG    n/a - Personally Reviewed  Physical Exam  Exam deferred in setting of active COVID infection to lower total number of patient hospital contacts and conserve PPE, low yield and higher risk.   Labs    High Sensitivity Troponin:   Recent Labs  Lab 11/16/19 0356 11/16/19 0501  TROPONINIHS 29*  30*      Chemistry Recent Labs  Lab 11/11/19 [phone] 11/11/19 1941 11/12/19 0609 11/12/19 0609 11/13/19 0529 11/13/19 0529 11/16/19 0501 11/16/19 1042 11/17/19 0256  NA 136   < > 137   < > 138   < > 134* 135 136  K 3.1*   < > 3.9   < > 4.0   < > 6.1* 5.5* 4.0  CL 88*   < > 89*   < > 90*   < > 91* 93* 92*  CO2 35*   < > 33*   < > 34*   < > 30 31 35*  GLUCOSE 161*   < > 125*   < > 158*   < > 134* 156* 98  BUN 29*   < > 31*   < > 32*   < > 53* 52* 42*  CREATININE 1.62*   < > 1.51*   < > 1.51*   < > 2.21* 2.22* 1.87*  CALCIUM 8.9   < > 8.8*   < > 8.9   < > 8.8* 8.7* 8.4*  PROT 6.6  --  6.0*  --  6.1*  --   --   --   --   ALBUMIN 2.8*   < > 2.5*  --  2.7*  --   --   --  2.5*  AST 24  --  25  --  29  --   --   --   --   ALT 9  --  7  --  5  --   --   --   --   ALKPHOS 60  --  58  --  67  --   --   --   --   BILITOT 0.7  --  0.5  --  0.7  --   --   --   --   GFRNONAA 30*   < > 33*   < > 33*   < > 21* 21* 25*  GFRAA 35*   < > 38*   < > 38*   < > 24* 24* 30*  ANIONGAP 13   < > 15   < > 14   < > 13 11 9    < > = values in this interval not displayed.     Hematology Recent Labs  Lab 11/12/19 0609 11/12/19 0609 11/13/19 0529 11/15/19 1535 11/16/19 0501 11/16/19 1736 11/17/19 0256  WBC 12.2*  --  10.5  --   --   --  17.4*  RBC 3.55*  --  3.74*  --   --   --  2.69*  HGB 11.7*   < > 12.4   < > 11.1* 9.6* 9.0*  HCT 36.2   < > 38.4   < > 35.1* 29.5* 28.1*  MCV 102.0*  --  102.7*  --   --   --  104.5*  MCH 33.0  --  33.2  --   --   --  33.5  MCHC 32.3  --  32.3  --   --   --  32.0  RDW 15.4  --  15.4  --   --   --  16.2*  PLT 147*  --  190  --   --   --  222   < > = values in this interval not displayed.    BNP Recent Labs  Lab 11/11/19 0639  BNP 547.0*     DDimer  Recent Labs  Lab 11/11/19 0639 11/12/19 0609 11/13/19 0529  DDIMER 0.62* 0.51* 0.65*     Radiology    DG ABD ACUTE 2+V W 1V CHEST  Result Date: 11/15/2019 CLINICAL DATA:  Abdomen pain EXAM: DG ABDOMEN  ACUTE W/ 1V CHEST COMPARISON:  11/13/2019 chest x-ray, 11/07/2019 FINDINGS: Single view chest demonstrates cardiac enlargement. Large hiatal hernia. Aortic atherosclerosis. Patchy foci of airspace disease at the periphery of the lung bases with overall improved aeration. Supine and upright views of the abdomen demonstrate no free air beneath the diaphragm. Gas-filled loops of borderline to mildly distended central small bowel with relative absence of distal and colon bowel gas. Postsurgical changes of both hips IMPRESSION: 1. Small amount of airspace disease at the periphery of the right base with overall improved aeration since 11/13/2019. Enlarged cardiac silhouette. Large hiatal hernia 2. Air-filled borderline to mildly distended central small bowel with relative absence of colon gas, mild or developing bowel obstruction is considered. Electronically Signed   By: Donavan Foil M.D.   On: 11/15/2019 18:56    Cardiac Studies     Patient Profile    [name] is a 79 y.o. female with past medical history of chronic diastolic CHF, persistent atrial fibrillation/flutter (on Coumadin for anticoagulation), presumed CAD (based off prior stress  test in 08/2018), HTN and Stage 3 CKD who is being seen today for the evaluation of acute systolic CHF/new cardiomyopathy at the request of Dr. Dyann Kief.   Assessment & Plan    1. Acute systolic HF - LVEF  LVEF 08-81% in setting of COVID pneumonia, new diagnosis for patient - no plans for inpatient cath. Can reconsider if LVEF does not improve with medical therapy over time - medical therapy limited by renal dysfunction. Currently on toprol 200mg  daily - torsemide on hold due to AKI yesterday. Aldactone on hold due to hyperkalemia  2. Afib with RVR - history of longstanding persistent afib. As outpatient she had been controlled with low dose beta blockers.  - issues with RVR this admission in setting of COVID pneumonia - toprol has been titrated to 200mg  daily  - avoiding digoxin given AKI. Avoiding diltiazem due to low LVEF -  Supratherapeutic INR, coumadin on hold.   - on toprol 200mg  daily, got a few rounds of IV lopressor yesterday. Expect some degree of tachycardia given her systemic illness, rates around 110 or less would be reasonable - overall rates reasonable at this time, continue toprol with prn IV lopressor. If persistent tachycardia above 130s would start amio gtt (more so for additional rate control, she is longstanding persistent afib and may not convert).    3. AKI - torsemide on hold - Cr trending down today. If continues to trend down likely restart torsemide 20mg  just once daily tomorrow and continue to monitor labs.   4. MSSA bacteremia - management per primary team  5. Possible GI bleed - management per primary team  6. COVID pneumonia - management per primary team  For questions or updates, please contact [facility] Please consult [email or web address] for contact info under        Signed, Carlyle Dolly, MD  11/17/2019, 8:24 AM

## 2019-11-18 LAB — PROTIME-INR
INR: 2.7 — ABNORMAL HIGH (ref 0.8–1.2)
Prothrombin Time: 28.8 seconds — ABNORMAL HIGH (ref 11.4–15.2)

## 2019-11-18 LAB — CBC
HCT: 26 % — ABNORMAL LOW (ref 36.0–46.0)
Hemoglobin: 8.4 g/dL — ABNORMAL LOW (ref 12.0–15.0)
MCH: 33.9 pg (ref 26.0–34.0)
MCHC: 32.3 g/dL (ref 30.0–36.0)
MCV: 104.8 fL — ABNORMAL HIGH (ref 80.0–100.0)
Platelets: 182 10*3/uL (ref 150–400)
RBC: 2.48 MIL/uL — ABNORMAL LOW (ref 3.87–5.11)
RDW: 16 % — ABNORMAL HIGH (ref 11.5–15.5)
WBC: 18.6 10*3/uL — ABNORMAL HIGH (ref 4.0–10.5)
nRBC: 0.2 % (ref 0.0–0.2)

## 2019-11-18 MED ORDER — WARFARIN SODIUM 3 MG PO TABS: 1.5000 mg | ORAL_TABLET | ORAL | 0 refills | Status: DC

## 2019-11-18 MED ORDER — IPRATROPIUM-ALBUTEROL 20-100 MCG/ACT IN AERS: 1.0000 | INHALATION_SPRAY | Freq: Three times a day (TID) | RESPIRATORY_TRACT | 0 refills | Status: DC

## 2019-11-18 MED ORDER — SACCHAROMYCES BOULARDII 250 MG PO CAPS: 250.0000 mg | ORAL_CAPSULE | Freq: Two times a day (BID) | ORAL | 1 refills | Status: AC

## 2019-11-18 MED ORDER — METOPROLOL SUCCINATE ER 200 MG PO TB24: 200.0000 mg | ORAL_TABLET | ORAL | 11 refills | Status: AC

## 2019-11-18 MED ORDER — PANTOPRAZOLE SODIUM 40 MG PO TBEC: 40.0000 mg | DELAYED_RELEASE_TABLET | Freq: Two times a day (BID) | ORAL | 1 refills | Status: DC

## 2019-11-18 MED ORDER — METOPROLOL SUCCINATE ER 25 MG PO TB24: 37.5000 mg | ORAL_TABLET | ORAL | 2 refills | Status: AC

## 2019-11-18 MED ORDER — FERROUS GLUCONATE 324 (38 FE) MG PO TABS: 324.0000 mg | ORAL_TABLET | Freq: Two times a day (BID) | ORAL | 3 refills | Status: AC

## 2019-11-18 MED ORDER — TORSEMIDE 20 MG PO TABS: 20.0000 mg | ORAL_TABLET | ORAL | 3 refills | Status: AC

## 2019-11-18 MED ORDER — CEFAZOLIN IV (FOR PTA / DISCHARGE USE ONLY): 2.0000 g | Freq: Two times a day (BID) | INTRAVENOUS | 0 refills | Status: DC

## 2019-11-18 MED ORDER — ASCORBIC ACID 500 MG PO TABS: 500.0000 mg | ORAL_TABLET | Freq: Every day | ORAL | 1 refills | Status: DC

## 2019-11-18 MED ORDER — LORAZEPAM 0.5 MG PO TABS: 0.5000 mg | ORAL_TABLET | Freq: Two times a day (BID) | ORAL | 0 refills | Status: AC | PRN

## 2019-11-18 NOTE — Progress Notes (Signed)
Spoke with case management, Hospitalist managing patient's care, and patient's niece and primary caregiver Eric Form.  Per MD, patient is to be discharged today, order placed to reflect that. Per case management, "the infusion company and [facility] are aware of patient discharging today." Ms. Liane Comber made aware of that as well via phone call x 2. She stated she thought that Ms. Bostick was going to be discharged tomorrow and was not able to drive as she does not feel well enough to drive. Discharge order clarified with MD at response of family member. MD stated "it is today." Family/Guardian made aware of following. EMS arranged to pick up patient on discharge.   Prescription for patients home antibiotic infusion, Ancef, faxed to infusion company. PICC patent, flushed, and locked. Patient discharged with [facility] in stable condition.

## 2019-11-18 NOTE — TOC Transition Note (Signed)
Transition of Care [facility]) - CM/SW Discharge Note   Patient Details  Name: DEANGELA RANDLEMAN MRN: [id number] Date of Birth: 02-16-1941  Transition of Care [facility]) CM/SW Contact:  Sherald Barge, RN Phone Number: 11/18/2019, 1:09 PM   Clinical Narrative:   Both infusion company and [facility] aware of DC home today. No further TOC needs.    Final next level of care: [place]      Discharge Plan and Services                 HH Arranged: RN, IV Antibiotics HH Agency: [facility] ([facility]) Date [place]: 11/15/19 Time [place]: 1241 Representative spoke with at [place]: Vaughan Basta with [facility]. [name] with [facility] received referral on 11/14/19

## 2019-11-18 NOTE — Discharge Instructions (Signed)
1) Home health RN to infuse iV antibiotics/Ancef/cefazolin 2 g every 12 hours through PICC line Indication:  MSSA Bacteremia Last Day of Therapy:  12/06/2019 Labs - Once weekly: every Tuesday  CBC/D and BMP, Labs - Every other week:  Every Other Tuesday ESR and CRP -PT/INR every Tuesday starting 11/21/2019 -Please verify with physician if okay to pull out pick  line at the end of therapy on 12/06/2019  2) metoprolol has been changed to Toprol-XL 200 mg daily to be taken along with Toprol-XL 37.5 mg daily for a total of 237.5 mg daily  3)Avoid ibuprofen/Advil/Aleve/Motrin/Goody Powders/Naproxen/BC powders/Meloxicam/Diclofenac/Indomethacin and other Nonsteroidal anti-inflammatory medications as these will make you more likely to bleed and can cause stomach ulcers, can also cause Kidney problems.   4) please take Protonix twice daily as prescribed due to concerns about possible gastrointestinal bleeding/blood loss  5) Home health physical therapy as advised  6) Home Health  Oxygen via [place] at 3L/min   7)Outpatient Palliative care advised

## 2019-11-18 NOTE — Discharge Summary (Signed)
[name], is a 79 y.o. female  DOB 01/19/1941  MRN [id number].  Admission date:  11/07/2019  Admitting Physician  Rhetta Mura, DO  Discharge Date:  11/18/2019   Primary MD  Doree Albee, MD  Recommendations for primary care physician for things to follow:   1) Home health RN to infuse iV antibiotics/Ancef/cefazolin 2 g every 12 hours through PICC line Indication:  MSSA Bacteremia Last Day of Therapy:  12/06/2019 Labs - Once weekly: every Tuesday  CBC/D and BMP, Labs - Every other week:  Every Other Tuesday ESR and CRP -PT/INR every Tuesday starting 11/21/2019 -Please verify with physician if okay to pull out pick  line at the end of therapy on 12/06/2019  2) metoprolol has been changed to Toprol-XL 200 mg daily to be taken along with Toprol-XL 37.5 mg daily for a total of 237.5 mg daily  3)Avoid ibuprofen/Advil/Aleve/Motrin/Goody Powders/Naproxen/BC powders/Meloxicam/Diclofenac/Indomethacin and other Nonsteroidal anti-inflammatory medications as these will make you more likely to bleed and can cause stomach ulcers, can also cause Kidney problems.   4) please take Protonix twice daily as prescribed due to concerns about possible gastrointestinal bleeding/blood loss  5) Home health physical therapy as advised  6) Home Health  Oxygen via [place] at 3L/min   7)Outpatient Palliative care advised  Admission Diagnosis  Pneumonia due to COVID-19 virus [U07.1, J12.82]   Discharge Diagnosis  Pneumonia due to COVID-19 virus [U07.1, J12.82]    Principal Problem:   Pneumonia due to COVID-19 virus Active Problems:   Atrial fibrillation (HCC)   Chronic anticoagulation   Chronic kidney disease, stage 3   Hypokalemia   Hypomagnesemia   Bacteremia due to methicillin susceptible Staphylococcus aureus (MSSA)      Past Medical History:  Diagnosis Date  . Adenomatous polyps    -resected via colonoscopy  in 2011  . Anemia   . Anxiety    MR  . Atrial fibrillation ([place])   . CHF (congestive heart failure) ([place])   . Chronic kidney disease   . Degenerative joint disease    status post right total hip arthroplasty  . Dysrhythmia   . GERD (gastroesophageal reflux disease)    -Barrett's esophagus  . Glaucoma   . Glaucoma   . Gout   . Hyperlipidemia   . Hypertension   . Mental retardation   . Peripheral neuropathy 09/12/2019  . Pneumonia   . Premature ventricular contractions   . Restless leg syndrome   . Shortness of breath dyspnea   . Wears dentures   . Wears glasses     Past Surgical History:  Procedure Laterality Date  . BIOPSY  02/22/2019   Procedure: BIOPSY;  Surgeon: Rogene Houston, MD;  [facility];  Service: Endoscopy;;  Esophageal biopies  . CATARACT EXTRACTION  2006   left eye  . COLONOSCOPY W/ POLYPECTOMY  2011  . COLONOSCOPY WITH PROPOFOL N/A 02/22/2019   Procedure: COLONOSCOPY WITH PROPOFOL;  Surgeon: Rogene Houston, MD;  [facility];  Service:  Endoscopy;  Laterality: N/A;  . ESOPHAGOGASTRODUODENOSCOPY N/A 01/01/2016   Procedure: ESOPHAGOGASTRODUODENOSCOPY (EGD);  Surgeon: Rogene Houston, MD;  [facility];  Service: Endoscopy;  Laterality: N/A;  200  . ESOPHAGOGASTRODUODENOSCOPY (EGD) WITH PROPOFOL N/A 02/22/2019   Procedure: ESOPHAGOGASTRODUODENOSCOPY (EGD) WITH PROPOFOL;  Surgeon: Rogene Houston, MD;  [facility];  Service: Endoscopy;  Laterality: N/A;  . EYE SURGERY    . JOINT REPLACEMENT     x2  . MULTIPLE TOOTH EXTRACTIONS    . RADIOLOGY WITH ANESTHESIA Right 11/21/2015   Procedure: MRI OF RIGHT KNEE WITHOUT CONTAST    (RADIOLOGY WITH ANESTHESIA);  Surgeon: Medication Radiologist, MD;  Location: [place];  Service: Radiology;  Laterality: Right;  . RADIOLOGY WITH ANESTHESIA N/A 08/11/2018   Procedure: MRI of the BRAIN WITH ANESTHESIA WITHOUT CONTRAST;  Surgeon: Radiologist, Medication, MD;  Location: [place];   Service: Radiology;  Laterality: N/A;  . TONSILLECTOMY    . TOTAL HIP ARTHROPLASTY  2006   Right     HPI  from the history and physical done on the day of admission:    Chief Complaint: Objective fever  HPI: [name] is a 79 y.o. female with medical history significant for chronic hypoxic respiratory failure in the setting of chronic diastolic heart failure on 3 L continuous supplemental oxygen, paroxysmal atrial fibrillation chronically anticoagulated on warfarin, hypertension, stage III chronic kidney disease with baseline creatinine of 1.4-1.5, developmental delay, who is admitted to [facility] on 11/07/2019 with COVID-19 pneumonia after resenting from home to [facility] emergency department for evaluation of objective fever.   In the setting of history of developmental delay associated with baseline mental status, the following history is obtained via my discussions with the emergency department physician as well as via chart review.  Per the patient's niece, with whom the patient lives, multiple family members to whom the patient has been exposed over the last week have been experiencing new onset rhinitis, rhinorrhea, sore throat, shortness of breath and subjective fever, although niece reports that none of these family members have been tested for Covid at this time.  Subsequent to the above symptoms experienced by patient's family members, the patient reportedly developed an objective fever over the last day, with temperature max at home noted to be 102-103 F.  [name] reportedly exhibited some dry heaving over that time in the absence of any diarrhea or rash.  Per niece, no increase in patient's supplemental oxygen demands relative to her baseline requirement of 3 L continuous nasal cannula.  Patient has undergone no recent traveling.  Past medical history is notable for a history of chronic diastolic heart failure.  Most recent echocardiogram was performed in June 2019 and  showed left ventricle associated with normal cavity size and normal wall thickness, LVEF 55 to 60%, no focal wall motion abnormalities, but showed evidence of diastolic dysfunction as well as mild mitral regurgitation and moderate tricuspid regurgitation.  Outpatient diuretic regimen consists of torsemide 20 mg p.o. twice daily.  While on this diuretic, the patient is also prescribed potassium chloride 20 mEq p.o. twice daily.  However, the patient's niece reports that the patient ran out of her potassium supplementation approximately 1 week ago, and has been unable to subsequently fill this prescription.    ED Course:  Vital signs in the ED were notable for the following: Temperature max 97.6; heart rate ranged from 77-1 05; blood pressure ranged from 110/61-120 9/73; respiratory rate 17-23; oxygen saturation 94  to 97% on baseline 3 L nasal cannula.  Labs were notable for the following: CMP notable for the following: Sodium 138, potassium 2.5, chloride 90, bicarbonate 32, creatinine 1.43 relative to most recent prior creatinine data point of 1.44 on 07/06/2019, liver enzymes found to be within normal limits.  Magnesium 1.1.  BNP 435 compared to most recent prior value of 219 on 04/11/2018.  CBC notable for white blood cell count of 13,500, hemoglobin 11.  Lactic acid 3.6.  INR 1.6.  Urinalysis showed no evidence of white blood cells, and was negative for leukocyte esterase and nitrates.  Rapid COVID-19 antigen test performed in the ED this evening was found to be positive.  Chest x-ray, per final radiology report, showed evidence of bilateral airspace opacities consistent with Covid pneumonia versus heart failure, and also showed small bilateral pleural effusions.  Interpretation of EKG performed in the ED was complicated by presence of artifact, although it appeared to demonstrate atrial fibrillation with ventricular rate 112, nonspecific T wave inversion in lead III, and no evidence of ST  changes.  While in the ED, the following were administered: In the setting of elevated presenting lactic acid level, the patient received a 250 cc normal saline bolus.  Additionally, she received potassium chloride 40 mEq p.o. x1, potassium chloride 30 mg IV over 3 hours x 1, and magnesium sulfate 2 g IV over 2 hours x 1.  Subsequently, the patient was admitted to the med telemetry floor at [facility] for further evaluation and management of COVID-19 pneumonia as well as multiple electrolyte abnormalities including hypokalemia and hypomagnesemia.    Hospital Course:      -- Brief Narrative:  79 y.o.femalewith medical history significant forchronic hypoxic respiratory failure in the setting of chronic diastolic heart failure on 3 L continuous supplemental oxygen, paroxysmal atrial fibrillation chronically anticoagulated on warfarin, hypertension, stage III chronic kidney disease with baseline creatinine of 1.4-1.5, developmental delay,who is admitted to [facility] on 1/5/2021with COVID-19pneumonia after resenting from home to [facility] emergency department for evaluation of objective fever.  In the setting of history of developmental delay associated with baseline mental status, the following history is obtained via my discussions with the emergency department physician as well as via chart review.  Per the patient's niece, with whom the patient lives, multiple family members to whom the patient has been exposed over the last week have been experiencing new onset rhinitis, rhinorrhea, sore throat, shortness of breath and subjective fever,although niece reports that none of these family members have been testedforCovid at this time.Subsequent to the above symptoms experienced by patient's family members, the patient reportedly developed an objective fever over the last day, with temperature max at home noted to be 102-103 F.[name] reportedly exhibited some dry  heavingover that time in the absence of any diarrhea or rash. Per niece, no increase in patient's supplemental oxygen demands relative to her baseline requirement of 3 L continuous nasal cannula.Patient has undergone no recent traveling.  Past medical history is notable for a history of chronic diastolic heart failure. Most recent echocardiogram was performed in June 2019 and showed left ventricle associated with normal cavity size and normal wall thickness, LVEF 55 to 60%, no focal wall motion abnormalities, but showed evidence of diastolic dysfunction as well as mild mitral regurgitation and moderate tricuspid regurgitation. Outpatient diuretic regimen consists of torsemide 20 mg p.o. twice daily.While on this diuretic, the patient is also prescribed potassium chloride 20 mEq p.o. twice daily.However, the patient's niece  reports that the patient ran out of her potassium supplementation approximately 1 week ago, and has been unable to subsequently fill this prescription.   Assessment & Plan: 1-sepsis secondary to pneumonia due to COVID-19 virus AND  Staph aureus (MSSA) bacteremia --Dyspnea is not worse, currently on 3 L of oxygen via nasal (at home chronically on 2.5-3 L) -Repeat chest x-ray from 11/15/2019 with improved aeration compared to chest x-ray from 11/13/2019 -On presentation patient met sepsis criteria; with elevated respiratory rate, elevated heart rate, low-grade temperature, elevated WBCs and also elevated lactic acid. -Sepsis pathophysiology resolved. -Patient has completed remdesivir therapy  -Stopped Solu-Medrol (started 11/08/19)  given concerns about GI bleed .  -continue As needed antipyretics and bronchodilators.  2)MSSA Bacteremia; following ID recommendations patient transitioned to IV cefazolin only.  Will follow infectious disease recommendation for length of treatment (total of 4 weeks)- last dose 12/06/19 -2D echo suboptimal, but no demonstrating  vegetations. -PICC line placed w/o complications. -Staph aureus species resistant to erythromycin and clindamycin.  -Please see discharge instructions for details of IV antibiotic infusion at home  2-Possible GI bleed--stool Hemoccult positive, INR supratherapeutic , treated with IV Protonix 40 every 12hr started 11/14/2018  -Discharge on p.o. Protonix -Hemoglobin drifted down with hydration--currently 8.4 -INR down to 2.7 from 4.5 --Given tenuous respiratory situation EGD will be high risk for patient -Symptomatic/medical management for now -  3)Persistent atrial fibrillation  -CHADsVASC score 5 EF 35 to 40% so avoid Cardizem, Toprol-XL has been titrated to 200 mg daily,  -For better rate control Toprol XL increased to 237.5 mg daily  ---Given staph bacteremia and COVID-19 respiratory infection--- some tachycardia may persist --Restart Coumadin at 1.5 mg daily, repeat INR on Tuesday, 11/21/2019 -Cardiology input appreciated -  4-Chronic anticoagulation -Follow INR. -Hold Coumadin due to concerns about GI bleed -Poor oral intake perpetuate elevated INR -INR was 4.5 down to 2.7 -Restart Coumadin at 1.5 mg daily, repeat INR on Tuesday, 11/21/2019  5-Chronic kidney disease, stage 3 -Appears overall stable and at baseline currently. -Per GFR evaluation patient with a stage IIIb chronic kidney disease. -Continue to follow renal function trend. -Pharmacy adjusting medications for renal function.  6-acute systolic heart failure  -Unable to assess diastolic function with atrial fibrillation at this time. -Patient with previous history of diastolic heart failure documentation and normal ejection fraction in 2017.   -Cardiology service has been consulted to assist with medication adjustment/optimization. -Continue to follow daily weights and low-sodium diet -Continue home dose torsemide. -Currently euvolemic and stable. - -No ACE inhibitors, Entresto or ARB or aldactone at this  moment given chronic renal failure and slight fluctuation of Cr.  -Further treatment and medication adjustment to be decided by cardiologist at follow-up visit. -EF 35 to 40%, continue Toprol-XL   7-hyperkalemia-calcium gluconate and Kayexalate given-stop Aldactone, potassium trending down from 6.1 to 4.0  8-chronic respiratory failure -Continue oxygen supplementation -Follow O2 sats and clinical response; wean oxygen supplementation down to baseline as tolerated. -PTA patient used 2.5-3 L oxygen supplementation chronically; currently using 3 L.  9-hypothyroidism -Continue Synthroid.  10-physical deconditioning -Physical therapy recommending skilled nursing facility -Patient and family at this time declines SNF rehab, they would rather have patient return home with Home health services   11)social/Ethics--- plan of care as already discussed with patient's sister [name] and patient's niece Ms Beau Fanny is a DNR/DNI -Patient overall prognosis is very poor especially given very poor oral intake, outpatient palliative care input requested -Family may have to consider transition  to hospice if she fails to improve   12)Disposition-- Patient and family at this time declines SNF rehab, they would rather have patient return home with Home health services --- -discussed with patient's niece Eric Form [phone] today prior to discharge, questions answered -Ms. Liane Comber was concerned about inability to drive here to pick patient up today, social worker was able to arrange for transportation  Code Status: DNR   Consultants:   ID (Dr. Megan Salon) with automatic counsultation secondary to positive for staph bacteremia  Cardiology service (secondary to acute systolic heart failure).  Discharge Condition: Stable, overall prognosis is very poor, anticipate transition to palliative care possibly hospice in the near future  Follow UP-PCP  Diet and Activity  recommendation:  As advised  Discharge Instructions   Discharge Instructions    Amb Referral to Palliative Care   Complete by: As directed    Call MD for:  difficulty breathing, headache or visual disturbances   Complete by: As directed    Call MD for:  extreme fatigue   Complete by: As directed    Call MD for:  persistant dizziness or light-headedness   Complete by: As directed    Call MD for:  persistant nausea and vomiting   Complete by: As directed    Call MD for:  severe uncontrolled pain   Complete by: As directed    Call MD for:  temperature >100.4   Complete by: As directed    Diet - low sodium heart healthy   Complete by: As directed    Discharge instructions   Complete by: As directed    1) Home health RN to infuse iV antibiotics/Ancef/cefazolin 2 g every 12 hours through PICC line Indication:  MSSA Bacteremia Last Day of Therapy:  12/06/2019 Labs - Once weekly: every Tuesday  CBC/D and BMP, Labs - Every other week:  Every Other Tuesday ESR and CRP -PT/INR every Tuesday starting 11/21/2019 -Please verify with physician if okay to pull out pick  line at the end of therapy on 12/06/2019  2) metoprolol has been changed to Toprol-XL 200 mg daily to be taken along with Toprol-XL 37.5 mg daily for a total of 237.5 mg daily  3)Avoid ibuprofen/Advil/Aleve/Motrin/Goody Powders/Naproxen/BC powders/Meloxicam/Diclofenac/Indomethacin and other Nonsteroidal anti-inflammatory medications as these will make you more likely to bleed and can cause stomach ulcers, can also cause Kidney problems.   4) please take Protonix twice daily as prescribed due to concerns about possible gastrointestinal bleeding/blood loss  5) Home health physical therapy as advised  6) Home Health  Oxygen via [place] at 3L/min   7)Outpatient Palliative care advised   Home infusion instructions   Complete by: As directed    Instructions: Flushing of vascular access device: 0.9% NaCl pre/post medication administration  and prn patency; Heparin 100 u/ml, 39m for implanted ports and Heparin 10u/ml, 575mfor all other central venous catheters.   Increase activity slowly   Complete by: As directed         Discharge Medications     Allergies as of 11/18/2019   No Known Allergies     Medication List    STOP taking these medications   esomeprazole 20 MG capsule Commonly known as: NEXIUM   metoprolol tartrate 25 MG tablet Commonly known as: LOPRESSOR   potassium chloride SA 20 MEQ tablet Commonly known as: KLOR-CON     TAKE these medications   acetaminophen 500 MG tablet Commonly known as: TYLENOL Take 500-1,000 mg by mouth every 6 (six) hours as  needed for moderate pain.   allopurinol 100 MG tablet Commonly known as: ZYLOPRIM Take 100 mg by mouth at bedtime.   ascorbic acid 500 MG tablet Commonly known as: VITAMIN C Take 1 tablet (500 mg total) by mouth daily. Start taking on: November 19, 2019   ceFAZolin  IVPB Commonly known as: ANCEF Inject 2 g into the vein every 12 (twelve) hours for 19 days. Indication:  MSSA Bacteremia Last Day of Therapy:  12/06/2019 Labs - Once weekly: every Tuesday  CBC/D and BMP, Labs - Every other week:  Every Other Tuesday ESR and CRP   ferrous gluconate 324 MG tablet Commonly known as: FERGON Take 1 tablet (324 mg total) by mouth 2 (two) times daily with a meal. What changed: when to take this   fluticasone 50 MCG/ACT nasal spray Commonly known as: FLONASE Place 1 spray into both nostrils daily.   gabapentin 300 MG capsule Commonly known as: NEURONTIN Take 300 mg at bedtime by mouth.   Ipratropium-Albuterol 20-100 MCG/ACT Aers respimat Commonly known as: COMBIVENT Inhale 1 puff into the lungs 3 (three) times daily.   loratadine 10 MG tablet Commonly known as: CLARITIN Take 10 mg by mouth daily.   LORazepam 0.5 MG tablet Commonly known as: ATIVAN Take 1 tablet (0.5 mg total) by mouth 2 (two) times daily as needed for anxiety.   meclizine  12.5 MG tablet Commonly known as: ANTIVERT Take 12.5 mg by mouth 2 (two) times daily as needed for dizziness.   metoprolol succinate 25 MG 24 hr tablet Commonly known as: TOPROL-XL Take 1.5 tablets (37.5 mg total) by mouth See admin instructions. Take along with Toprol-XL 200 mg daily for a total of 237.5 mg daily   metoprolol 200 MG 24 hr tablet Commonly known as: Toprol XL Take 1 tablet (200 mg total) by mouth See admin instructions. Take along with Toprol-XL 37.5 mg daily for a total of 237.5 mg daily   neomycin-polymyxin-hydrocortisone OTIC solution Commonly known as: CORTISPORIN INSTILL 2-3 DROPS IN [facility] EAR TWICE DAILY What changed: See the new instructions.   NP Thyroid 90 MG tablet Generic drug: thyroid Take 1 tablet by mouth once daily What changed:   how much to take  when to take this   ondansetron 4 MG disintegrating tablet Commonly known as: ZOFRAN-ODT TAKE 1 TABLET BY MOUTH EVERY 4 TO 6 HOURS AS NEEDED What changed: See the new instructions.   pantoprazole 40 MG tablet Commonly known as: Protonix Take 1 tablet (40 mg total) by mouth 2 (two) times daily before a meal.   saccharomyces boulardii 250 MG capsule Commonly known as: FLORASTOR Take 1 capsule (250 mg total) by mouth 2 (two) times daily.   Simbrinza 1-0.2 % Susp Generic drug: Brinzolamide-Brimonidine Place 1 drop into both eyes 2 (two) times daily.   torsemide 20 MG tablet Commonly known as: DEMADEX Take 1 tablet (20 mg total) by mouth every Monday, Wednesday, and Friday. Start taking on: November 20, 2019 What changed: when to take this   Vitamin D3 125 MCG (5000 UT) Caps Take 5,000 Units by mouth daily.   warfarin 3 MG tablet Commonly known as: COUMADIN Take 0.5 tablets (1.5 mg total) by mouth every Monday, Tuesday, Wednesday, Thursday, and Friday. Does not take on Saturday/Sunday Start taking on: November 20, 2019 What changed: how much to take   Xalatan 0.005 % ophthalmic  solution Generic drug: latanoprost Place 1 drop into both eyes at bedtime.  Home Infusion Instuctions  (From admission, onward)         Start     Ordered   11/18/19 0000  Home infusion instructions    Question:  Instructions  Answer:  Flushing of vascular access device: 0.9% NaCl pre/post medication administration and prn patency; Heparin 100 u/ml, 66m for implanted ports and Heparin 10u/ml, 519mfor all other central venous catheters.   11/18/19 1345          Major procedures and Radiology Reports - PLEASE review detailed and final reports for all details, in brief -    DG CHEST PORT 1 VIEW  Result Date: 11/13/2019 CLINICAL DATA:  [age]ear old female with positive COVID-19 status post central line placement. EXAM: PORTABLE CHEST 1 VIEW COMPARISON:  Chest radiograph dated 11/07/2019. FINDINGS: Right-sided PICC with tip in the region of the cavoatrial junction. There is cardiomegaly with mild vascular congestion. Diffuse bilateral interstitial prominence may represent mild edema or pneumonia. Clinical correlation is recommended. A small left pleural effusion may be present. No pneumothorax. No acute osseous pathology. IMPRESSION: 1. Right-sided PICC with tip over the cavoatrial junction. 2. Cardiomegaly with vascular congestion. 3. Interstitial prominence may represent edema versus pneumonia. Clinical correlation is recommended. Electronically Signed   By: ArAnner Crete.D.   On: 11/13/2019 19:52   DG Chest Port 1 View  Result Date: 11/07/2019 CLINICAL DATA:  Hypoxia.  COVID 19 positive EXAM: PORTABLE CHEST 1 VIEW COMPARISON:  03/20/2019 FINDINGS: Diffuse bilateral airspace disease most prominent in the left lung base. Probable bilateral pleural effusions. Very large hiatal hernia best seen on prior CT. Cardiac enlargement with vascular congestion. IMPRESSION: Diffuse bilateral airspace disease with small effusions. Left lower lobe consolidation. Findings most typical for  heart failure with edema however given the COVID-19 status, pneumonia is quite possible. Electronically Signed   By: ChFranchot Gallo.D.   On: 11/07/2019 15:18   DG ABD ACUTE 2+V W 1V CHEST  Result Date: 11/15/2019 CLINICAL DATA:  Abdomen pain EXAM: DG ABDOMEN ACUTE W/ 1V CHEST COMPARISON:  11/13/2019 chest x-ray, 11/07/2019 FINDINGS: Single view chest demonstrates cardiac enlargement. Large hiatal hernia. Aortic atherosclerosis. Patchy foci of airspace disease at the periphery of the lung bases with overall improved aeration. Supine and upright views of the abdomen demonstrate no free air beneath the diaphragm. Gas-filled loops of borderline to mildly distended central small bowel with relative absence of distal and colon bowel gas. Postsurgical changes of both hips IMPRESSION: 1. Small amount of airspace disease at the periphery of the right base with overall improved aeration since 11/13/2019. Enlarged cardiac silhouette. Large hiatal hernia 2. Air-filled borderline to mildly distended central small bowel with relative absence of colon gas, mild or developing bowel obstruction is considered. Electronically Signed   By: KiDonavan Foil.D.   On: 11/15/2019 18:56   ECHOCARDIOGRAM LIMITED  Result Date: 11/09/2019   ECHOCARDIOGRAM LIMITED REPORT   [facility]2019 Medical Rec #:  [id number]   Height:       68.5 in Accession #:    [id number]  Weight:       189.2 lb Date of Birth:  12/1940/11/27    BSA:          2.01 m Patient Age:    [age]      BP:           174/90 mmHg Patient Gender: F  HR:           106 bpm. Exam Location:  [facility]  Procedure: Limited Echo Indications:    Bacteremia  History:        Patient has prior history of Echocardiogram examinations, most                 recent 04/12/2018. Chronic Anticoagulation, COVID 19, GERD, Acute                 Rena l Injury.  Sonographer:    Leavy Cella RDCS (AE) Referring Phys: [place]  1. Left ventricular ejection fraction, by visual estimation, is approximately 35 to 40%. The left ventricle has moderately decreased function. There is mildly increased left ventricular wall thickness. Images are limited and not all myocardial segments visualized.  2. The left ventricle demonstrates global hypokinesis.  3. Left ventricular diastolic parameters are indeterminate in the setting of atrial fibrillation.  4. Global right ventricle has mildly reduced systolic function.The right ventricular size is normal. no increase in right ventricular wall thickness.  5. Left atrial size was moderately dilated.  6. Presence of pericardial fat pad.  7. The mitral valve is grossly normal. Trivial mitral valve regurgitation.  8. The tricuspid valve was grossly normal. Tricuspid valve regurgitation is mild.  9. Tricuspid valve regurgitation is mild. 10. TR signal is inadequate for assessing pulmonary artery systolic pressure. 11. The inferior vena cava is normal in size with <50% respiratory variability, suggesting right atrial pressure of 8 mmHg. 12. No obvious valvular vegetations. FINDINGS  Left Ventricle: Left ventricular ejection fraction, by visual estimation, is 35 to 40%. The left ventricle has moderately decreased function. The left ventricle demonstrates global hypokinesis. The left ventricular internal cavity size was the left ventricle is normal in size. There is mildly increased left ventricular wall thickness. Left ventricular diastolic parameters are indeterminate. Right Ventricle: The right ventricular size is normal. No increase in right ventricular wall thickness. Global RV systolic function is has mildly reduced systolic function. Left Atrium: Left atrial size was moderately dilated. Right Atrium: Right atrial size was normal in size. Right atrial pressure is estimated at 8 mmHg. Pericardium: There is no evidence of pericardial effusion is seen. There is no evidence of pericardial effusion.  Presence of pericardial fat pad. Mitral Valve: The mitral valve is grossly normal. There is mild calcification of the mitral valve leaflet(s). MV Area by PHT, 5.54 cm. MV PHT, 39.73 msec. Trivial mitral valve regurgitation. Tricuspid Valve: The tricuspid valve is grossly normal. Tricuspid valve regurgitation is mild. Aortic Valve: The aortic valve is tricuspid. Aortic valve regurgitation is trivial. Mild aortic valve annular calcification. Pulmonic Valve: The pulmonic valve was not well visualized. Pulmonic valve regurgitation is not visualized by color flow Doppler. Pulmonic regurgitation is not visualized by color flow Doppler. Aorta: The aortic root is normal in size and structure. Venous: The inferior vena cava is normal in size with less than 50% respiratory variability, suggesting right atrial pressure of 8 mmHg. Shunts: No atrial level shunt detected by color flow Doppler.  LEFT VENTRICLE         Normals PLAX 2D LVIDd:         4.04 cm 3.6 cm   Diastology                 Normals LVIDs:         2.79 cm 1.7 cm   LV e' lateral:   9.57 cm/s 6.42 cm/s LV  PW:         1.36 cm 1.4 cm   LV E/e' lateral: 8.7       15.4 LV IVS:        1.26 cm 1.3 cm   LV e' medial:    6.42 cm/s 6.96 cm/s LV SV:         42 ml   79 ml    LV E/e' medial:  12.9      6.96 LV SV Index:   20.62   45 ml/m2  LEFT ATRIUM           Index       RIGHT ATRIUM           Index LA diam:      3.80 cm 1.89 cm/m  RA Area:     19.50 cm LA Vol (A2C): 43.6 ml 21.73 ml/m RA Volume:   54.30 ml  27.06 ml/m LA Vol (A4C): 96.4 ml 48.04 ml/m   AORTA                 Normals Ao Root diam: 2.80 cm 31 mm MITRAL VALVE              Normals MV Area (PHT): 5.54 cm MV PHT:        39.73 msec 55 ms MV Decel Time: 137 msec   187 ms MV E velocity: 82.90 cm/s 103 cm/s MV A velocity: 36.40 cm/s 70.3 cm/s MV E/A ratio:  2.28       1.5  Rozann Lesches MD Electronically signed by Rozann Lesches MD Signature Date/Time: 11/09/2019/11:19:40 AMThe mitral valve is grossly normal.     Final    [place] EKG SITE RITE  Result Date: 11/13/2019 If Site Rite image not attached, placement could not be confirmed due to current cardiac rhythm.   Micro Results    Recent Results (from the past 240 hour(s))  Culture, blood (routine x 2)     Status: Abnormal   Collection Time: 11/09/19 12:30 PM   Specimen: BLOOD RIGHT WRIST  Result Value Ref Range Status   Specimen Description   Final    BLOOD RIGHT WRIST Performed at [facility], [address], [place], [place] [address]    Special Requests   Final    BOTTLES DRAWN AEROBIC AND ANAEROBIC Blood Culture adequate volume Performed at [facility], [address], [place], [place] [address]    Culture  Setup Time   Final    GRAM POSITIVE COCCI IN BOTH AEROBIC AND ANAEROBIC BOTTLES Gram Stain Report Called to,Read Back By and Verified With: DEAN T. AT [id number] ON [id number] BY THOMPSON S. Performed at [facility], [address], [place], [place] [address]    Culture (A)  Final    STAPHYLOCOCCUS AUREUS SUSCEPTIBILITIES PERFORMED ON PREVIOUS CULTURE WITHIN THE LAST 5 DAYS. Performed at [facility], [place] [address], [place], [place] [address]    Report Status 11/12/2019 FINAL  Final  Culture, blood (routine x 2)     Status: None   Collection Time: 11/09/19 12:38 PM   Specimen: BLOOD RIGHT HAND  Result Value Ref Range Status   Specimen Description BLOOD RIGHT HAND  Final   Special Requests   Final    BOTTLES DRAWN AEROBIC AND ANAEROBIC Blood Culture adequate volume   Culture   Final    NO GROWTH 6 DAYS Performed at [facility], [address] [place], [place] [address]    Report Status 11/15/2019 FINAL  Final  Culture, blood (routine x 2)     Status: None   Collection Time: 11/11/19 11:18 AM   Specimen: Right Antecubital; Blood  Result Value Ref Range Status   Specimen Description   Final    RIGHT ANTECUBITAL BOTTLES DRAWN AEROBIC AND ANAEROBIC   Special Requests Blood Culture adequate volume  Final   Culture   Final     NO GROWTH 5 DAYS Performed at [facility], [address], [place], [place] [address]    Report Status 11/16/2019 FINAL  Final  Culture, blood (routine x 2)     Status: None   Collection Time: 11/11/19 11:27 AM   Specimen: BLOOD RIGHT ARM  Result Value Ref Range Status   Specimen Description   Final    BLOOD RIGHT ARM BOTTLES DRAWN AEROBIC AND ANAEROBIC   Special Requests Blood Culture adequate volume  Final   Culture   Final    NO GROWTH 5 DAYS Performed at [facility], [address] [place], [place] [address]    Report Status 11/16/2019 FINAL  Final  MRSA PCR Screening     Status: None   Collection Time: 11/16/19  3:01 PM   Specimen: Nasal Mucosa; Nasopharyngeal  Result Value Ref Range Status   MRSA by PCR NEGATIVE NEGATIVE Final    Comment:        The GeneXpert MRSA Assay (FDA approved for NASAL specimens only), is one component of a comprehensive MRSA colonization surveillance program. It is not intended to diagnose MRSA infection nor to guide or monitor treatment for MRSA infections. Performed at [facility], [address] [place], [place] [address]        Today   Subjective    Denyse Fillion today has no new complaints -Appetite remains poor -No fevers -No chest pains         Patient has been seen and examined prior to discharge   Objective   Blood pressure 111/72, pulse 96, temperature 99 F (37.2 C), temperature source Axillary, resp. rate 17, height 5' 4"  (1.626 m), weight 84.8 kg, SpO2 93 %.   Intake/Output Summary (Last 24 hours) at 11/18/2019 1624 Last data filed at 11/17/2019 2300 Gross per 24 hour  Intake --  Output 400 ml  Net -400 ml    Exam Gen:- Awake Alert, chronically ill-appearing ,no acute distress HEENT:- [place].AT, No sclera icterus Nose- 3L/min Neck-Supple Neck,No JVD,.  Lungs-diminished in bases, no wheezing  CV- S1, S2 normal, irregular Abd-  +ve B.Sounds, Abd Soft, No tenderness,   Extremity/Skin:-+ve 1  edema, good  pulses, left Breast, left flank area with resolving ecchymosis Psych-affect is flat, oriented x 1 Neuro-generalized weakness, no new focal deficits, no tremors    Data Review   CBC w Diff:  Lab Results  Component Value Date   WBC 18.6 (H) 11/18/2019   HGB 8.4 (L) 11/18/2019   HCT 26.0 (L) 11/18/2019   PLT 182 11/18/2019   LYMPHOPCT 4 11/13/2019   MONOPCT 3 11/13/2019   EOSPCT 0 11/13/2019   BASOPCT 1 11/13/2019    CMP:  Lab Results  Component Value Date   NA 136 11/17/2019   K 4.0 11/17/2019   CL 92 (L) 11/17/2019   CO2 35 (H) 11/17/2019   BUN 42 (H) 11/17/2019   CREATININE 1.87 (H) 11/17/2019   CREATININE 1.44 (H) 07/06/2019   PROT 6.1 (L) 11/13/2019   ALBUMIN 2.5 (L) 11/17/2019   BILITOT 0.7 11/13/2019   ALKPHOS 67 11/13/2019   AST  29 11/13/2019   ALT 5 11/13/2019  .   Patient and family at this time declines SNF rehab, they would rather have patient return home with Home health services   Total Discharge time is about 33 minutes  Roxan Hockey M.D on 11/18/2019 at 4:24 PM  Go to [email or web address] -  for contact info  [facility] - Office  [phone]

## 2019-11-19 DIAGNOSIS — I4819 Other persistent atrial fibrillation: Secondary | ICD-10-CM | POA: Diagnosis not present

## 2019-11-19 DIAGNOSIS — S80811D Abrasion, right lower leg, subsequent encounter: Secondary | ICD-10-CM | POA: Diagnosis not present

## 2019-11-19 DIAGNOSIS — D631 Anemia in chronic kidney disease: Secondary | ICD-10-CM | POA: Diagnosis not present

## 2019-11-19 DIAGNOSIS — M1990 Unspecified osteoarthritis, unspecified site: Secondary | ICD-10-CM | POA: Diagnosis not present

## 2019-11-19 DIAGNOSIS — N183 Chronic kidney disease, stage 3 unspecified: Secondary | ICD-10-CM | POA: Diagnosis not present

## 2019-11-19 DIAGNOSIS — I130 Hypertensive heart and chronic kidney disease with heart failure and stage 1 through stage 4 chronic kidney disease, or unspecified chronic kidney disease: Secondary | ICD-10-CM | POA: Diagnosis not present

## 2019-11-19 DIAGNOSIS — J1282 Pneumonia due to coronavirus disease 2019: Secondary | ICD-10-CM | POA: Diagnosis not present

## 2019-11-19 DIAGNOSIS — U071 COVID-19: Secondary | ICD-10-CM | POA: Diagnosis not present

## 2019-11-19 DIAGNOSIS — I2510 Atherosclerotic heart disease of native coronary artery without angina pectoris: Secondary | ICD-10-CM | POA: Diagnosis not present

## 2019-11-19 DIAGNOSIS — E876 Hypokalemia: Secondary | ICD-10-CM | POA: Diagnosis not present

## 2019-11-19 DIAGNOSIS — A4189 Other specified sepsis: Secondary | ICD-10-CM | POA: Diagnosis not present

## 2019-11-19 DIAGNOSIS — I5032 Chronic diastolic (congestive) heart failure: Secondary | ICD-10-CM | POA: Diagnosis not present

## 2019-11-19 DIAGNOSIS — J9611 Chronic respiratory failure with hypoxia: Secondary | ICD-10-CM | POA: Diagnosis not present

## 2019-11-19 DIAGNOSIS — I5021 Acute systolic (congestive) heart failure: Secondary | ICD-10-CM | POA: Diagnosis not present

## 2019-11-19 DIAGNOSIS — F79 Unspecified intellectual disabilities: Secondary | ICD-10-CM | POA: Diagnosis not present

## 2019-11-19 DIAGNOSIS — G629 Polyneuropathy, unspecified: Secondary | ICD-10-CM | POA: Diagnosis not present

## 2019-11-19 DIAGNOSIS — I429 Cardiomyopathy, unspecified: Secondary | ICD-10-CM | POA: Diagnosis not present

## 2019-11-19 DIAGNOSIS — E039 Hypothyroidism, unspecified: Secondary | ICD-10-CM | POA: Diagnosis not present

## 2019-11-19 DIAGNOSIS — A4101 Sepsis due to Methicillin susceptible Staphylococcus aureus: Secondary | ICD-10-CM | POA: Diagnosis not present

## 2019-11-19 DIAGNOSIS — Z452 Encounter for adjustment and management of vascular access device: Secondary | ICD-10-CM | POA: Diagnosis not present

## 2019-11-19 DIAGNOSIS — M109 Gout, unspecified: Secondary | ICD-10-CM | POA: Diagnosis not present

## 2019-11-19 DIAGNOSIS — I493 Ventricular premature depolarization: Secondary | ICD-10-CM | POA: Diagnosis not present

## 2019-11-19 DIAGNOSIS — R6520 Severe sepsis without septic shock: Secondary | ICD-10-CM | POA: Diagnosis not present

## 2019-11-19 DIAGNOSIS — L89612 Pressure ulcer of right heel, stage 2: Secondary | ICD-10-CM | POA: Diagnosis not present

## 2019-11-20 ENCOUNTER — Other Ambulatory Visit (INDEPENDENT_AMBULATORY_CARE_PROVIDER_SITE_OTHER): Payer: Self-pay | Admitting: Nurse Practitioner

## 2019-11-20 ENCOUNTER — Telehealth (INDEPENDENT_AMBULATORY_CARE_PROVIDER_SITE_OTHER): Payer: Self-pay

## 2019-11-20 ENCOUNTER — Telehealth (INDEPENDENT_AMBULATORY_CARE_PROVIDER_SITE_OTHER): Payer: Self-pay | Admitting: Internal Medicine

## 2019-11-20 DIAGNOSIS — Z452 Encounter for adjustment and management of vascular access device: Secondary | ICD-10-CM | POA: Diagnosis not present

## 2019-11-20 DIAGNOSIS — J1282 Pneumonia due to coronavirus disease 2019: Secondary | ICD-10-CM | POA: Diagnosis not present

## 2019-11-20 DIAGNOSIS — U071 COVID-19: Secondary | ICD-10-CM

## 2019-11-20 DIAGNOSIS — A4101 Sepsis due to Methicillin susceptible Staphylococcus aureus: Secondary | ICD-10-CM | POA: Diagnosis not present

## 2019-11-20 DIAGNOSIS — R6520 Severe sepsis without septic shock: Secondary | ICD-10-CM | POA: Diagnosis not present

## 2019-11-20 DIAGNOSIS — A4189 Other specified sepsis: Secondary | ICD-10-CM | POA: Diagnosis not present

## 2019-11-20 MED ORDER — IPRATROPIUM-ALBUTEROL 20-100 MCG/ACT IN AERS: 1.0000 | INHALATION_SPRAY | Freq: Four times a day (QID) | RESPIRATORY_TRACT | 2 refills | Status: DC | PRN

## 2019-11-20 NOTE — Progress Notes (Addendum)
This patient's caregiver called back and told me that they did call EMS to have patient transported to the hospital.  They came out and evaluate her and told the caregiver and the patient that they would not transfer her to the hospital due to bed availability issues.  The caregiver tells me that the patient's oxygenation was 85% at the time of evaluation by EMS.  They recommended that if the patient starts to deteriorate then to call 911 again.  I talked to the caregiver and I will refill the patient's as needed inhaler (Combivent) that she can take every 6 hours to help with her breathing.  I also encouraged her to call 911 if the patient deteriorates any further.  She tells me that she understands.  Update 1800: I did consult with Dr. Anastasio Champion regarding the above.  He too is concerned with an oxygen saturation of 85% and recommended that the patient's caregiver drive the patient to the emergency department for further evaluation of EMS would not bring her.  I did call this patient's caregiver [name]) and discuss this with her.  She tells me since repositioning the patient the patient's oxygen saturations are now at 92%.  She would like to monitor the patient overnight at home, and I am agreeable with this as the oxygenation is improved.  I did let the caregiver know that if the patient's status deteriorates that then they need to proceed to the emergency department.  The caregiver tells me she understands.

## 2019-11-20 NOTE — Telephone Encounter (Signed)
I returned this patient's niece's phone call.  She tells me that the patient's oxygenation did get up to 85% when she put the patient on 4 L/min of oxygen.  I told her that that is too low and the patient does need to proceed to the emergency department for further evaluation and care.  The niece tells me she understands and will bring her to the hospital.

## 2019-11-20 NOTE — Telephone Encounter (Signed)
I was able to discuss these concerns with this patient's caregiver [name]).  The patient is being followed by home health nursing.  The patient's caregiver is concerned because the patient recently was started on a high dose of metoprolol.  I told her as long as the patient's heart rate is above 60 this should be okay to keep her on for now.  They will continue to monitor.

## 2019-11-20 NOTE — Telephone Encounter (Signed)
[name] with [facility] called needing verbal orders for patient for PT. Recently had pneumonia secondary to covid. Please call back [phone]

## 2019-11-20 NOTE — Telephone Encounter (Signed)
Patients niece is calling stating her O2 is in the 35s. Niece has increased o2 to 3.5 lpm. Was given an inhaler at the hospital but wasn't given a prescription for it and wasn't given the inhaler at the hospital at discharge. It is on the discharge instructions. Would like a call back. Doesn't want to take patient back to hospital. Also wasn't told to increase home O2, but niece has done so to try and get stats back up.

## 2019-11-21 ENCOUNTER — Other Ambulatory Visit: Payer: Self-pay | Admitting: Pharmacist

## 2019-11-21 ENCOUNTER — Other Ambulatory Visit (INDEPENDENT_AMBULATORY_CARE_PROVIDER_SITE_OTHER): Payer: Self-pay | Admitting: Internal Medicine

## 2019-11-21 ENCOUNTER — Other Ambulatory Visit (INDEPENDENT_AMBULATORY_CARE_PROVIDER_SITE_OTHER): Payer: Self-pay

## 2019-11-21 ENCOUNTER — Telehealth (INDEPENDENT_AMBULATORY_CARE_PROVIDER_SITE_OTHER): Payer: Self-pay

## 2019-11-21 ENCOUNTER — Other Ambulatory Visit: Payer: Self-pay | Admitting: *Deleted

## 2019-11-21 ENCOUNTER — Telehealth (INDEPENDENT_AMBULATORY_CARE_PROVIDER_SITE_OTHER): Payer: Self-pay | Admitting: Nurse Practitioner

## 2019-11-21 DIAGNOSIS — A4189 Other specified sepsis: Secondary | ICD-10-CM | POA: Diagnosis not present

## 2019-11-21 DIAGNOSIS — U071 COVID-19: Secondary | ICD-10-CM

## 2019-11-21 DIAGNOSIS — R6520 Severe sepsis without septic shock: Secondary | ICD-10-CM | POA: Diagnosis not present

## 2019-11-21 DIAGNOSIS — A4101 Sepsis due to Methicillin susceptible Staphylococcus aureus: Secondary | ICD-10-CM | POA: Diagnosis not present

## 2019-11-21 DIAGNOSIS — Z5181 Encounter for therapeutic drug level monitoring: Secondary | ICD-10-CM | POA: Diagnosis not present

## 2019-11-21 DIAGNOSIS — J1282 Pneumonia due to coronavirus disease 2019: Secondary | ICD-10-CM

## 2019-11-21 DIAGNOSIS — Z452 Encounter for adjustment and management of vascular access device: Secondary | ICD-10-CM | POA: Diagnosis not present

## 2019-11-21 MED ORDER — IPRATROPIUM-ALBUTEROL 20-100 MCG/ACT IN AERS: 1.0000 | INHALATION_SPRAY | Freq: Four times a day (QID) | RESPIRATORY_TRACT | 2 refills | Status: DC | PRN

## 2019-11-21 MED ORDER — IPRATROPIUM-ALBUTEROL 20-100 MCG/ACT IN AERS: 1.0000 | INHALATION_SPRAY | Freq: Four times a day (QID) | RESPIRATORY_TRACT | 2 refills | Status: AC | PRN

## 2019-11-21 NOTE — Patient Outreach (Addendum)
Red EMMI flag received for general discharge day #1, Questions regarding discharge papers- yes.  Scheduled follow up- No. Pt hospitalized 1/5-1/16/21 with Covid-19/ pneumonia. Outreach call to address issues, spoke with [name] sister, DPR who reports pt is not able to talk to RN CM.  Per [name], she has been in contact with primary care provider daily since pt came home from hospital on 11/18/19, [name] does plan to make follow up appointment but has been busy with home health ([facility]) assisting with IV antibiotics, pt has PICC line. [name] states the questions she has are related to medications citing pt does not have combivent inhaler as listed on discharge instructions and they called MD office yesterday about getting prescription for inhaler but do not have it yet as pharmacy does not have the prescription.  [name] reports EMS came out yesterday because pt 02 sat dropped (they are checking several times daily) and EMS did not transport due to bed availability issues and primary MD instructed CG to drive pt to ED, by then, 02 had increased to 92% so CG did not take pt. Per [name] 02 sats have been good today in 90's range.  [name] reports pt is on CAP program with adequate care, has all needed DME, has home health in place.  RN CM did discuss per note in EMR about palliative care and [name] states this has not been mentioned to her and feels they are doing okay caring for pt with home health at present, no further discussion initiated at this time and [name] denies any level of care issues at present stating also pt will not be going to any facility.  RN CM called [facility], spoke with Ran who reports they do not have prescription for combivent.  RN CM faxed note to primary MD requesting combivent inhaler be faxed to [place], also called primary MD office and unable to speak with anyone, left voicemail with CMA requesting prescription for inhaler be called in.   RN CM placed order for [facility]  pharmacist for any questions related to medications including dosage of metoprolol and pt not having inhaler on hand.  No further concerns voiced. Late entry- [name] from Dr. Anastasio Champion called and said MD called inhaler into [facility], MD also sent in basket stating the same.  RN CM called patient's sister [name] to let her know as well as informed [facility] pharmacist.  [name] [facility], [place] Coordinator [phone]

## 2019-11-21 NOTE — Telephone Encounter (Signed)
Thank you, they will fax over orders today.

## 2019-11-21 NOTE — Telephone Encounter (Signed)
[facility] Called to see if Dr Anastasio Champion is willing to sign off on the pallative care order for the patient.

## 2019-11-21 NOTE — Patient Outreach (Signed)
[facility]) Care Management  [place]   11/21/2019  [name] 11-30-1940 [id number]  Reason for referral: Medication Management  -Per notes, patient recently discharged from hospital with new order for Combivent inhaler but no RX sent to pharmacy.  [facility] RN has communicated need for inhaler to PCP office.  Family also has questions on metoprolol dose which was adjusted on discharge.   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  Per pharmacy associate, Combivent inhaler ready for pick up and cost is $0.   Successful telephone call with [name], patient's niece.  HIPAA identifiers verified.   Subjective:  [name] denies need for full medication review at this time.  She would like to discuss metoprolol dosing.  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  She is assisting patient with IV ancef via PICC and has no questions about this.  She states she and another family plus patient have all had COVID-19 in the last 2 weeks and are still quarantining however a son and another family member in the area that are picking up medication and food for them.     Objective: The ASCVD Risk score Mikey Bussing DC Jr., et al., 2013) failed to calculate for the following reasons:   Cannot find a previous HDL lab   Cannot find a previous total cholesterol lab  Lab Results  Component Value Date   CREATININE 1.87 (H) 11/17/2019   CREATININE 2.22 (H)  11/16/2019   CREATININE 2.21 (H) 11/16/2019    No results found for: HGBA1C  Lipid Panel     Component Value Date/Time   CHOL 132 07/27/2009 0000   TRIG 69 07/27/2009 0000   HDL 43 07/27/2009 0000   LDLCALC 75 07/27/2009 0000    BP Readings from Last 3 Encounters:  11/18/19 111/72  11/07/19 (!) 122/57  09/12/19 122/71    No Known Allergies  Medications Reviewed Today    Reviewed by Lyndal Rainbow, CPhT (Pharmacy Technician) on 11/07/19 at [place] List Status: Complete  Medication Order Taking? Sig Documenting Provider Last Dose Status Informant  acetaminophen (TYLENOL) 500 MG tablet [id number] Yes Take 500-1,000 mg by mouth every 6 (six) hours as needed for moderate pain.  [provider] unknown Active Family Member  allopurinol (ZYLOPRIM) 100 MG tablet [id number] Yes Take 100 mg by mouth at bedtime.  [provider] 11/06/2019 Unknown time Active Family Member  Brinzolamide-Brimonidine [facility]) 1-0.2 % SUSP [id number] Yes Place 1 drop into both eyes 2 (two) times daily.  [provider] 11/06/2019 Unknown time Active Family Member  Cholecalciferol (VITAMIN D3) 5000 units CAPS [id number] Yes Take 5,000 Units by mouth daily.  [provider] 11/07/2019 Unknown time Active Family Member  esomeprazole (NEXIUM) 20 MG capsule [id number] Yes Take 20 mg by mouth 2 (two) times daily before a meal.  [provider] 11/07/2019 Unknown time Active Family Member  ferrous gluconate (FERGON) 324 MG tablet [id number] Yes Take 324 mg by mouth 3 (three) times a week.  [provider]  Past Week Unknown time Active Family Member           Med Note Faythe Casa Mar 20, 2019  1:22 PM)    fluticasone (FLONASE) 50 MCG/ACT nasal spray [id number] Yes Place 1 spray into both nostrils daily. [provider] 11/07/2019 Unknown time Active Family Member  gabapentin (NEURONTIN) 300 MG capsule [id number] Yes Take 300 mg at bedtime by mouth. [provider] 11/06/2019 Unknown time Active Family Member  latanoprost (XALATAN) 0.005 % ophthalmic solution [id number] Yes Place 1 drop into both eyes at bedtime.   [provider] 11/06/2019 Unknown time Active Family Member  loratadine (CLARITIN) 10 MG tablet [id number] Yes Take 10 mg by mouth daily.  [provider] 11/07/2019 Unknown time Active Family Member  LORazepam (ATIVAN) 0.5 MG tablet [id number] Yes Take 1 tablet (0.5 mg total) by mouth 2 (two) times daily as needed for anxiety. Doree Albee, MD unknown Active Family Member  meclizine (ANTIVERT) 12.5 MG tablet [id number] Yes Take 12.5 mg by mouth 2 (two) times daily as needed for dizziness.  [provider] 11/07/2019 Unknown time Active Family Member  metoprolol tartrate (LOPRESSOR) 25 MG tablet [id number] Yes TAKE 1 TABLET BY MOUTH 2 TIMES A DAY Herminio Commons, MD 11/07/2019 Unknown time Active Family Member        Discontinued 11/07/19 1629 (Inpatient Standard)   neomycin-polymyxin-hydrocortisone (CORTISPORIN) OTIC solution [id number] Yes INSTILL 2-3 DROPS IN [facility] EAR TWICE DAILY  Patient taking differently: Place 2-3 drops into both ears 2 (two) times daily as needed (for ear pain).    Doree Albee, MD unknown Active Family Member  NP THYROID 90 MG tablet [id number] Yes Take 1 tablet by mouth once daily  Patient taking differently: Take 90 mg by mouth every morning.    Doree Albee, MD 11/07/2019 Unknown time Active Family Member  ondansetron (ZOFRAN-ODT) 4 MG disintegrating tablet [id number] Yes TAKE 1 TABLET BY MOUTH EVERY 4 TO 6 HOURS AS NEEDED  Patient taking differently: Take 4 mg by mouth every 4 (four) hours as needed for nausea or vomiting.    Doree Albee, MD 11/07/2019 Unknown time Active Family Member  potassium chloride SA (K-DUR,KLOR-CON) 20 MEQ tablet [id number] Yes Take 20 mEq by mouth 2 (two) times daily.  [provider] Past Week Unknown time Active Family Member            Med Note Trinidad Curet, Celedonio Savage   Tue Nov 07, 2019  4:26 PM) Per niece needs refill-has been 1 week  torsemide (DEMADEX) 20 MG tablet [id number] Yes TAKE 1 TABLET BY MOUTH TWICE DAILY  Patient taking differently: Take 20 mg by mouth 2 (two) times daily.    Herminio Commons, MD 11/07/2019 Unknown time Active Family Member  warfarin (COUMADIN) 3 MG tablet [id number] Yes Take 3 mg by mouth every Monday, Tuesday, Wednesday, Thursday, and Friday. Does not take on Saturday/Sunday [provider] 11/07/2019 800 Active Family Member           Med Note Faythe Casa Mar 20, 2019  1:24 PM)            Assessment: I reviewed Combivent inhaler directions with niece and that it is ready for pick-up with $0 co-pay.  [name] states her son is going to pick up inhaler and bring it to the home for patient.  She also wanted to confirm metoprolol dosing which I reviewed with her per AVS.  She confirmed she has correct strength at home for new dosing (200mg  + 37.5mg  tablets).  We also reviewed Ancef dosing.  She denies needing to review any other medication for patient at this time.  I encouraged her to reach out to myself, [facility] RN, or PCP office if she has any further questions or concerns.  She voiced understanding and appreciation.    Plan: . Will route note to [facility] RN.  . Will close [facility] pharmacy case as no further medication needs identified at this time.  Am happy to assist in the future as needed.     Ralene Bathe, PharmD, [place] [phone]

## 2019-11-21 NOTE — Telephone Encounter (Signed)
Absolutely!  I asked them to send me the order and I will sign it.

## 2019-11-21 NOTE — Telephone Encounter (Signed)
I called this patient's caregiver to see how the patient is feeling.  The patient's caregiver tells me that the oxygen is improved.  Her oxygenation is currently 98%.  When she did give the patient a bath and did have to roll her a few times oxygen did drop down again to 85% but came right back up to 98% I encouraged the caregiver to turn down the patient's oxygen from 4 L/min to 3 L/min to see how she tolerates this.  If her oxygenation remains above 90% she can keep the patient at 3 L/min, but should increase the oxygen to 4 L/min for any activity.  The caregiver tells me she understands.  She will continue to monitor the patient closely and patient will continue to receive home health nursing.

## 2019-11-22 ENCOUNTER — Other Ambulatory Visit (INDEPENDENT_AMBULATORY_CARE_PROVIDER_SITE_OTHER): Payer: Self-pay | Admitting: Nurse Practitioner

## 2019-11-22 ENCOUNTER — Inpatient Hospital Stay (HOSPITAL_COMMUNITY)
Admission: EM | Admit: 2019-11-22 | Discharge: 2019-11-27 | DRG: 177 | Disposition: A | Discharge status: Home Health | Payer: Medicare Other | Attending: Family Medicine | Admitting: Family Medicine

## 2019-11-22 ENCOUNTER — Emergency Department (HOSPITAL_COMMUNITY): Payer: Medicare Other

## 2019-11-22 ENCOUNTER — Telehealth (INDEPENDENT_AMBULATORY_CARE_PROVIDER_SITE_OTHER): Payer: Self-pay | Admitting: Nurse Practitioner

## 2019-11-22 ENCOUNTER — Encounter (HOSPITAL_COMMUNITY): Payer: Self-pay

## 2019-11-22 DIAGNOSIS — D649 Anemia, unspecified: Secondary | ICD-10-CM

## 2019-11-22 DIAGNOSIS — M109 Gout, unspecified: Secondary | ICD-10-CM | POA: Diagnosis present

## 2019-11-22 DIAGNOSIS — J9621 Acute and chronic respiratory failure with hypoxia: Secondary | ICD-10-CM | POA: Diagnosis present

## 2019-11-22 DIAGNOSIS — E876 Hypokalemia: Secondary | ICD-10-CM

## 2019-11-22 DIAGNOSIS — Z209 Contact with and (suspected) exposure to unspecified communicable disease: Secondary | ICD-10-CM | POA: Diagnosis not present

## 2019-11-22 DIAGNOSIS — R58 Hemorrhage, not elsewhere classified: Secondary | ICD-10-CM | POA: Diagnosis not present

## 2019-11-22 DIAGNOSIS — K219 Gastro-esophageal reflux disease without esophagitis: Secondary | ICD-10-CM | POA: Diagnosis present

## 2019-11-22 DIAGNOSIS — Z66 Do not resuscitate: Secondary | ICD-10-CM | POA: Diagnosis not present

## 2019-11-22 DIAGNOSIS — M1990 Unspecified osteoarthritis, unspecified site: Secondary | ICD-10-CM | POA: Diagnosis present

## 2019-11-22 DIAGNOSIS — I130 Hypertensive heart and chronic kidney disease with heart failure and stage 1 through stage 4 chronic kidney disease, or unspecified chronic kidney disease: Secondary | ICD-10-CM | POA: Diagnosis present

## 2019-11-22 DIAGNOSIS — R7881 Bacteremia: Secondary | ICD-10-CM | POA: Diagnosis present

## 2019-11-22 DIAGNOSIS — Z7189 Other specified counseling: Secondary | ICD-10-CM

## 2019-11-22 DIAGNOSIS — E785 Hyperlipidemia, unspecified: Secondary | ICD-10-CM | POA: Diagnosis present

## 2019-11-22 DIAGNOSIS — R404 Transient alteration of awareness: Secondary | ICD-10-CM | POA: Diagnosis not present

## 2019-11-22 DIAGNOSIS — D539 Nutritional anemia, unspecified: Secondary | ICD-10-CM | POA: Diagnosis present

## 2019-11-22 DIAGNOSIS — Z79899 Other long term (current) drug therapy: Secondary | ICD-10-CM

## 2019-11-22 DIAGNOSIS — B9562 Methicillin resistant Staphylococcus aureus infection as the cause of diseases classified elsewhere: Secondary | ICD-10-CM | POA: Diagnosis present

## 2019-11-22 DIAGNOSIS — E039 Hypothyroidism, unspecified: Secondary | ICD-10-CM | POA: Diagnosis present

## 2019-11-22 DIAGNOSIS — Z7901 Long term (current) use of anticoagulants: Secondary | ICD-10-CM

## 2019-11-22 DIAGNOSIS — I5043 Acute on chronic combined systolic (congestive) and diastolic (congestive) heart failure: Secondary | ICD-10-CM | POA: Diagnosis present

## 2019-11-22 DIAGNOSIS — F79 Unspecified intellectual disabilities: Secondary | ICD-10-CM | POA: Diagnosis present

## 2019-11-22 DIAGNOSIS — R633 Feeding difficulties: Secondary | ICD-10-CM | POA: Diagnosis present

## 2019-11-22 DIAGNOSIS — R0902 Hypoxemia: Secondary | ICD-10-CM | POA: Diagnosis present

## 2019-11-22 DIAGNOSIS — I4891 Unspecified atrial fibrillation: Secondary | ICD-10-CM | POA: Diagnosis not present

## 2019-11-22 DIAGNOSIS — R531 Weakness: Secondary | ICD-10-CM | POA: Diagnosis not present

## 2019-11-22 DIAGNOSIS — H409 Unspecified glaucoma: Secondary | ICD-10-CM | POA: Diagnosis present

## 2019-11-22 DIAGNOSIS — A4101 Sepsis due to Methicillin susceptible Staphylococcus aureus: Secondary | ICD-10-CM | POA: Diagnosis not present

## 2019-11-22 DIAGNOSIS — Z8249 Family history of ischemic heart disease and other diseases of the circulatory system: Secondary | ICD-10-CM

## 2019-11-22 DIAGNOSIS — U071 COVID-19: Secondary | ICD-10-CM | POA: Diagnosis not present

## 2019-11-22 DIAGNOSIS — K2270 Barrett's esophagus without dysplasia: Secondary | ICD-10-CM | POA: Diagnosis present

## 2019-11-22 DIAGNOSIS — N183 Chronic kidney disease, stage 3 unspecified: Secondary | ICD-10-CM | POA: Diagnosis present

## 2019-11-22 DIAGNOSIS — J1282 Pneumonia due to coronavirus disease 2019: Secondary | ICD-10-CM | POA: Diagnosis present

## 2019-11-22 DIAGNOSIS — Z452 Encounter for adjustment and management of vascular access device: Secondary | ICD-10-CM | POA: Diagnosis not present

## 2019-11-22 DIAGNOSIS — G629 Polyneuropathy, unspecified: Secondary | ICD-10-CM | POA: Diagnosis present

## 2019-11-22 DIAGNOSIS — E871 Hypo-osmolality and hyponatremia: Secondary | ICD-10-CM | POA: Diagnosis present

## 2019-11-22 DIAGNOSIS — G2581 Restless legs syndrome: Secondary | ICD-10-CM | POA: Diagnosis present

## 2019-11-22 DIAGNOSIS — Z515 Encounter for palliative care: Secondary | ICD-10-CM | POA: Diagnosis not present

## 2019-11-22 DIAGNOSIS — Z96641 Presence of right artificial hip joint: Secondary | ICD-10-CM | POA: Diagnosis present

## 2019-11-22 DIAGNOSIS — A4189 Other specified sepsis: Secondary | ICD-10-CM | POA: Diagnosis not present

## 2019-11-22 DIAGNOSIS — I482 Chronic atrial fibrillation, unspecified: Secondary | ICD-10-CM | POA: Diagnosis present

## 2019-11-22 DIAGNOSIS — D689 Coagulation defect, unspecified: Secondary | ICD-10-CM

## 2019-11-22 DIAGNOSIS — R791 Abnormal coagulation profile: Secondary | ICD-10-CM

## 2019-11-22 DIAGNOSIS — R6520 Severe sepsis without septic shock: Secondary | ICD-10-CM | POA: Diagnosis not present

## 2019-11-22 LAB — PROTIME-INR
INR: 6.5 (ref 0.8–1.2)
Prothrombin Time: 57.2 seconds — ABNORMAL HIGH (ref 11.4–15.2)

## 2019-11-22 LAB — BASIC METABOLIC PANEL
Anion gap: 12 (ref 5–15)
BUN: 41 mg/dL — ABNORMAL HIGH (ref 8–23)
CO2: 32 mmol/L (ref 22–32)
Calcium: 7.8 mg/dL — ABNORMAL LOW (ref 8.9–10.3)
Chloride: 86 mmol/L — ABNORMAL LOW (ref 98–111)
Creatinine, Ser: 2.27 mg/dL — ABNORMAL HIGH (ref 0.44–1.00)
GFR calc Af Amer: 23 mL/min — ABNORMAL LOW (ref >60)
GFR calc non Af Amer: 20 mL/min — ABNORMAL LOW (ref >60)
Glucose, Bld: 95 mg/dL (ref 70–99)
Potassium: 2.8 mmol/L — ABNORMAL LOW (ref 3.5–5.1)
Sodium: 130 mmol/L — ABNORMAL LOW (ref 135–145)

## 2019-11-22 LAB — CBC WITH DIFFERENTIAL/PLATELET
Abs Immature Granulocytes: 0.32 10*3/uL — ABNORMAL HIGH (ref 0.00–0.07)
Basophils Absolute: 0 10*3/uL (ref 0.0–0.1)
Basophils Relative: 0 %
Eosinophils Absolute: 0.1 10*3/uL (ref 0.0–0.5)
Eosinophils Relative: 0 %
HCT: 24.4 % — ABNORMAL LOW (ref 36.0–46.0)
Hemoglobin: 8 g/dL — ABNORMAL LOW (ref 12.0–15.0)
Immature Granulocytes: 2 %
Lymphocytes Relative: 2 %
Lymphs Abs: 0.4 10*3/uL — ABNORMAL LOW (ref 0.7–4.0)
MCH: 33.5 pg (ref 26.0–34.0)
MCHC: 32.8 g/dL (ref 30.0–36.0)
MCV: 102.1 fL — ABNORMAL HIGH (ref 80.0–100.0)
Monocytes Absolute: 0.3 10*3/uL (ref 0.1–1.0)
Monocytes Relative: 2 %
Neutro Abs: 13.8 10*3/uL — ABNORMAL HIGH (ref 1.7–7.7)
Neutrophils Relative %: 94 %
Platelets: 183 10*3/uL (ref 150–400)
RBC: 2.39 MIL/uL — ABNORMAL LOW (ref 3.87–5.11)
RDW: 16.4 % — ABNORMAL HIGH (ref 11.5–15.5)
WBC: 14.9 10*3/uL — ABNORMAL HIGH (ref 4.0–10.5)
nRBC: 0.1 % (ref 0.0–0.2)

## 2019-11-22 LAB — POC OCCULT BLOOD, ED: Fecal Occult Bld: NEGATIVE

## 2019-11-22 LAB — TYPE AND SCREEN
ABO/RH(D): O POS
Antibody Screen: NEGATIVE

## 2019-11-22 LAB — BRAIN NATRIURETIC PEPTIDE: B Natriuretic Peptide: 563 pg/mL — ABNORMAL HIGH (ref 0.0–100.0)

## 2019-11-22 MED ORDER — POTASSIUM CHLORIDE 10 MEQ/100ML IV SOLN
10.0000 meq | Freq: Once | INTRAVENOUS | Status: AC
  Administered 2019-11-22: 10 meq via INTRAVENOUS
  Filled 2019-11-22: qty 100

## 2019-11-22 MED ORDER — POTASSIUM CHLORIDE CRYS ER 20 MEQ PO TBCR: 40.0000 meq | EXTENDED_RELEASE_TABLET | Freq: Every day | ORAL | 3 refills | Status: DC

## 2019-11-22 MED ORDER — SODIUM CHLORIDE 0.9% FLUSH
3.0000 mL | Freq: Once | INTRAVENOUS | Status: AC
  Administered 2019-11-22: 3 mL via INTRAVENOUS

## 2019-11-22 NOTE — ED Triage Notes (Signed)
Per ems, pt was diagnosed with covid on 11/07/2019.  Reports had blood work yesterday and was told today her hemoglobin was low, inr 5.  Reports pt's o2 sat on room air was 80% and increased to 97% on 4liters.

## 2019-11-22 NOTE — Telephone Encounter (Signed)
I was able to contact Dr. Anastasio Champion and discussed my concerns over the phone.  Per his consult he recommended potassium supplementation to start with 40 mEq every 8 hours x 2 doses and then 40 mEq daily.  I also told him that I am going to order blood work to be repeated tomorrow by [facility].  He tells me he understands.  I then contacted this patient's caregiver [name]) regarding the lab results.  I told her about the medication and discussed the patient's anemia.  The caregiver does tell me the patient has complained of some dizziness and upset stomach today.  She tells me her last blood pressure was 108/61.  Patient's heart rate is stable in the 70s.  But patient is also on a beta-blocker.  The caregiver does say that the patient does have some black tarry stools but does not note any malodor to the stools.  The caregiver has not checked the patient's INR yet today and tells me she will do this and call us back.  Will give further recommendations based on INR.

## 2019-11-22 NOTE — Telephone Encounter (Signed)
Patient's labs came back showing potassium level of 2.8. I was going to call her pharmacy and order potassium chloride 33meq every 8 hours for three doses. I would call denise and tell her this so she can administer. Also RBC are low 2.24, but hemoglobin is 7.8. Will make sure [name] does not see any bleeding, if she does will send her to ER, but otherwise was just going to monitor CBC. Do you think outpatient administration of the oral potassium is appropriate? Will have advanced check repeat labs tomorrow

## 2019-11-22 NOTE — Telephone Encounter (Signed)
This patient's caregiver and niece called back and stated that Joe Gammons, INR is now 5.9.  I am concerned that the patient is having an active GI bleed.  This is based on the fact that the caregiver reports the patient has had 5 dark tarry stools today, has complained of dizziness, has complained of abdominal pain, and blood pressure is borderline low.  I also did chart review and see that the patient's hemoglobin has been trending down from 12.4 on 11/13/2019 down to 8.4 on 11/18/2019 and now down to 7.8 on 11/22/2019.  I did consult with Dr. Anastasio Champion regarding all of this and both he and I believe it is pertinent that the patient be evaluated in the emergency department at this time for possible GI bleed.  I did inform the caregiver of this and she tells me she is going to call EMS.

## 2019-11-22 NOTE — ED Notes (Signed)
Date and time results received: 11/22/19 2012  Test: INR Critical Value: 6.5  Name of Provider Notified: Zackowski  Orders Received? Or Actions Taken?: na

## 2019-11-23 ENCOUNTER — Telehealth: Payer: Self-pay

## 2019-11-23 ENCOUNTER — Other Ambulatory Visit: Payer: Self-pay

## 2019-11-23 DIAGNOSIS — E785 Hyperlipidemia, unspecified: Secondary | ICD-10-CM | POA: Diagnosis present

## 2019-11-23 DIAGNOSIS — R0902 Hypoxemia: Secondary | ICD-10-CM | POA: Diagnosis not present

## 2019-11-23 DIAGNOSIS — D631 Anemia in chronic kidney disease: Secondary | ICD-10-CM | POA: Diagnosis not present

## 2019-11-23 DIAGNOSIS — J1282 Pneumonia due to coronavirus disease 2019: Secondary | ICD-10-CM | POA: Diagnosis present

## 2019-11-23 DIAGNOSIS — R7881 Bacteremia: Secondary | ICD-10-CM | POA: Diagnosis present

## 2019-11-23 DIAGNOSIS — I5043 Acute on chronic combined systolic (congestive) and diastolic (congestive) heart failure: Secondary | ICD-10-CM | POA: Diagnosis present

## 2019-11-23 DIAGNOSIS — U071 COVID-19: Secondary | ICD-10-CM | POA: Diagnosis present

## 2019-11-23 DIAGNOSIS — J9621 Acute and chronic respiratory failure with hypoxia: Secondary | ICD-10-CM | POA: Diagnosis present

## 2019-11-23 DIAGNOSIS — I482 Chronic atrial fibrillation, unspecified: Secondary | ICD-10-CM | POA: Diagnosis not present

## 2019-11-23 DIAGNOSIS — E039 Hypothyroidism, unspecified: Secondary | ICD-10-CM | POA: Diagnosis present

## 2019-11-23 DIAGNOSIS — B9562 Methicillin resistant Staphylococcus aureus infection as the cause of diseases classified elsewhere: Secondary | ICD-10-CM | POA: Diagnosis present

## 2019-11-23 DIAGNOSIS — E559 Vitamin D deficiency, unspecified: Secondary | ICD-10-CM | POA: Diagnosis not present

## 2019-11-23 DIAGNOSIS — M109 Gout, unspecified: Secondary | ICD-10-CM | POA: Diagnosis present

## 2019-11-23 DIAGNOSIS — Z9981 Dependence on supplemental oxygen: Secondary | ICD-10-CM | POA: Diagnosis not present

## 2019-11-23 DIAGNOSIS — H409 Unspecified glaucoma: Secondary | ICD-10-CM | POA: Diagnosis present

## 2019-11-23 DIAGNOSIS — K2270 Barrett's esophagus without dysplasia: Secondary | ICD-10-CM | POA: Diagnosis present

## 2019-11-23 DIAGNOSIS — Z515 Encounter for palliative care: Secondary | ICD-10-CM | POA: Diagnosis present

## 2019-11-23 DIAGNOSIS — R29898 Other symptoms and signs involving the musculoskeletal system: Secondary | ICD-10-CM | POA: Diagnosis not present

## 2019-11-23 DIAGNOSIS — N183 Chronic kidney disease, stage 3 unspecified: Secondary | ICD-10-CM | POA: Diagnosis not present

## 2019-11-23 DIAGNOSIS — F79 Unspecified intellectual disabilities: Secondary | ICD-10-CM | POA: Diagnosis present

## 2019-11-23 DIAGNOSIS — D539 Nutritional anemia, unspecified: Secondary | ICD-10-CM | POA: Diagnosis present

## 2019-11-23 DIAGNOSIS — E871 Hypo-osmolality and hyponatremia: Secondary | ICD-10-CM | POA: Diagnosis present

## 2019-11-23 DIAGNOSIS — K219 Gastro-esophageal reflux disease without esophagitis: Secondary | ICD-10-CM | POA: Diagnosis not present

## 2019-11-23 DIAGNOSIS — M1990 Unspecified osteoarthritis, unspecified site: Secondary | ICD-10-CM | POA: Diagnosis present

## 2019-11-23 DIAGNOSIS — D689 Coagulation defect, unspecified: Secondary | ICD-10-CM | POA: Diagnosis present

## 2019-11-23 DIAGNOSIS — Z7189 Other specified counseling: Secondary | ICD-10-CM | POA: Diagnosis not present

## 2019-11-23 DIAGNOSIS — E876 Hypokalemia: Secondary | ICD-10-CM | POA: Diagnosis present

## 2019-11-23 DIAGNOSIS — I130 Hypertensive heart and chronic kidney disease with heart failure and stage 1 through stage 4 chronic kidney disease, or unspecified chronic kidney disease: Secondary | ICD-10-CM | POA: Diagnosis not present

## 2019-11-23 DIAGNOSIS — J189 Pneumonia, unspecified organism: Secondary | ICD-10-CM | POA: Diagnosis not present

## 2019-11-23 DIAGNOSIS — I5030 Unspecified diastolic (congestive) heart failure: Secondary | ICD-10-CM | POA: Diagnosis not present

## 2019-11-23 DIAGNOSIS — Z66 Do not resuscitate: Secondary | ICD-10-CM | POA: Diagnosis present

## 2019-11-23 DIAGNOSIS — Z7401 Bed confinement status: Secondary | ICD-10-CM | POA: Diagnosis not present

## 2019-11-23 DIAGNOSIS — I4891 Unspecified atrial fibrillation: Secondary | ICD-10-CM | POA: Diagnosis not present

## 2019-11-23 LAB — RESPIRATORY PANEL BY RT PCR (FLU A&B, COVID)
Influenza A by PCR: NEGATIVE
Influenza B by PCR: NEGATIVE
SARS Coronavirus 2 by RT PCR: POSITIVE — AB

## 2019-11-23 LAB — URINALYSIS, ROUTINE W REFLEX MICROSCOPIC
Bilirubin Urine: NEGATIVE
Glucose, UA: NEGATIVE mg/dL
Ketones, ur: 5 mg/dL — AB
Leukocytes,Ua: NEGATIVE
Nitrite: NEGATIVE
Protein, ur: NEGATIVE mg/dL
Specific Gravity, Urine: 1.015 (ref 1.005–1.030)
pH: 5 (ref 5.0–8.0)

## 2019-11-23 LAB — COMPREHENSIVE METABOLIC PANEL
ALT: 5 U/L (ref 0–44)
AST: 15 U/L (ref 15–41)
Albumin: 2.1 g/dL — ABNORMAL LOW (ref 3.5–5.0)
Alkaline Phosphatase: 93 U/L (ref 38–126)
Anion gap: 12 (ref 5–15)
BUN: 43 mg/dL — ABNORMAL HIGH (ref 8–23)
CO2: 32 mmol/L (ref 22–32)
Calcium: 8 mg/dL — ABNORMAL LOW (ref 8.9–10.3)
Chloride: 85 mmol/L — ABNORMAL LOW (ref 98–111)
Creatinine, Ser: 2.15 mg/dL — ABNORMAL HIGH (ref 0.44–1.00)
GFR calc Af Amer: 25 mL/min — ABNORMAL LOW (ref >60)
GFR calc non Af Amer: 22 mL/min — ABNORMAL LOW (ref >60)
Glucose, Bld: 90 mg/dL (ref 70–99)
Potassium: 3.2 mmol/L — ABNORMAL LOW (ref 3.5–5.1)
Sodium: 129 mmol/L — ABNORMAL LOW (ref 135–145)
Total Bilirubin: 0.9 mg/dL (ref 0.3–1.2)
Total Protein: 6 g/dL — ABNORMAL LOW (ref 6.5–8.1)

## 2019-11-23 LAB — MAGNESIUM: Magnesium: 1.4 mg/dL — ABNORMAL LOW (ref 1.7–2.4)

## 2019-11-23 LAB — CBC
HCT: 26.2 % — ABNORMAL LOW (ref 36.0–46.0)
Hemoglobin: 8.4 g/dL — ABNORMAL LOW (ref 12.0–15.0)
MCH: 32.8 pg (ref 26.0–34.0)
MCHC: 32.1 g/dL (ref 30.0–36.0)
MCV: 102.3 fL — ABNORMAL HIGH (ref 80.0–100.0)
Platelets: 199 10*3/uL (ref 150–400)
RBC: 2.56 MIL/uL — ABNORMAL LOW (ref 3.87–5.11)
RDW: 16.9 % — ABNORMAL HIGH (ref 11.5–15.5)
WBC: 16.3 10*3/uL — ABNORMAL HIGH (ref 4.0–10.5)
nRBC: 0 % (ref 0.0–0.2)

## 2019-11-23 LAB — POC SARS CORONAVIRUS 2 AG -  ED: SARS Coronavirus 2 Ag: NEGATIVE

## 2019-11-23 LAB — TRIGLYCERIDES: Triglycerides: 143 mg/dL (ref <150)

## 2019-11-23 LAB — PROTIME-INR
INR: 7.3 (ref 0.8–1.2)
Prothrombin Time: 62.7 seconds — ABNORMAL HIGH (ref 11.4–15.2)

## 2019-11-23 LAB — FERRITIN: Ferritin: 116 ng/mL (ref 11–307)

## 2019-11-23 LAB — LACTIC ACID, PLASMA: Lactic Acid, Venous: 1.7 mmol/L (ref 0.5–1.9)

## 2019-11-23 LAB — LACTATE DEHYDROGENASE: LDH: 305 U/L — ABNORMAL HIGH (ref 98–192)

## 2019-11-23 LAB — FIBRINOGEN: Fibrinogen: 624 mg/dL — ABNORMAL HIGH (ref 210–475)

## 2019-11-23 LAB — PROCALCITONIN: Procalcitonin: 4.73 ng/mL

## 2019-11-23 LAB — D-DIMER, QUANTITATIVE: D-Dimer, Quant: 0.93 ug/mL-FEU — ABNORMAL HIGH (ref 0.00–0.50)

## 2019-11-23 LAB — GLUCOSE, CAPILLARY: Glucose-Capillary: 94 mg/dL (ref 70–99)

## 2019-11-23 LAB — C-REACTIVE PROTEIN: CRP: 28.9 mg/dL — ABNORMAL HIGH (ref <1.0)

## 2019-11-23 MED ORDER — MAGNESIUM SULFATE 2 GM/50ML IV SOLN
2.0000 g | Freq: Once | INTRAVENOUS | Status: AC
  Administered 2019-11-23: 2 g via INTRAVENOUS
  Filled 2019-11-23: qty 50

## 2019-11-23 MED ORDER — THYROID 60 MG PO TABS
90.0000 mg | ORAL_TABLET | Freq: Every day | ORAL | Status: DC
  Administered 2019-11-23 – 2019-11-27 (×5): 90 mg via ORAL
  Filled 2019-11-23 (×7): qty 1

## 2019-11-23 MED ORDER — MECLIZINE HCL 12.5 MG PO TABS: 12.5000 mg | ORAL_TABLET | Freq: Two times a day (BID) | ORAL | Status: DC | PRN

## 2019-11-23 MED ORDER — CEFAZOLIN SODIUM-DEXTROSE 2-4 GM/100ML-% IV SOLN
2.0000 g | Freq: Two times a day (BID) | INTRAVENOUS | Status: DC
  Administered 2019-11-23: 2 g via INTRAVENOUS
  Filled 2019-11-23 (×2): qty 100

## 2019-11-23 MED ORDER — ONDANSETRON 4 MG PO TBDP
4.0000 mg | ORAL_TABLET | ORAL | Status: DC | PRN
  Administered 2019-11-24 – 2019-11-25 (×2): 4 mg via ORAL
  Filled 2019-11-23 (×2): qty 1

## 2019-11-23 MED ORDER — ACETAMINOPHEN 650 MG RE SUPP: 650.0000 mg | Freq: Four times a day (QID) | RECTAL | Status: DC | PRN

## 2019-11-23 MED ORDER — IPRATROPIUM-ALBUTEROL 20-100 MCG/ACT IN AERS: 1.0000 | INHALATION_SPRAY | Freq: Four times a day (QID) | RESPIRATORY_TRACT | Status: DC | PRN

## 2019-11-23 MED ORDER — FUROSEMIDE 10 MG/ML IJ SOLN
40.0000 mg | Freq: Two times a day (BID) | INTRAMUSCULAR | Status: DC
  Administered 2019-11-23 – 2019-11-27 (×9): 40 mg via INTRAVENOUS
  Filled 2019-11-23 (×9): qty 4

## 2019-11-23 MED ORDER — VITAMIN D 25 MCG (1000 UNIT) PO TABS
5000.0000 [IU] | ORAL_TABLET | Freq: Every day | ORAL | Status: DC
  Administered 2019-11-23 – 2019-11-26 (×4): 5000 [IU] via ORAL
  Filled 2019-11-23 (×3): qty 5

## 2019-11-23 MED ORDER — CEFAZOLIN SODIUM-DEXTROSE 2-4 GM/100ML-% IV SOLN: 2.0000 g | Freq: Two times a day (BID) | INTRAVENOUS | Status: DC

## 2019-11-23 MED ORDER — VITAMIN K1 10 MG/ML IJ SOLN
1.0000 mg | Freq: Once | INTRAVENOUS | Status: AC
  Administered 2019-11-23: 1 mg via INTRAVENOUS
  Filled 2019-11-23: qty 0.1

## 2019-11-23 MED ORDER — POTASSIUM CHLORIDE IN NACL 40-0.9 MEQ/L-% IV SOLN
INTRAVENOUS | Status: AC
  Administered 2019-11-23: 50 mL/h via INTRAVENOUS

## 2019-11-23 MED ORDER — LATANOPROST 0.005 % OP SOLN
1.0000 [drp] | Freq: Every day | OPHTHALMIC | Status: DC
  Administered 2019-11-23 – 2019-11-26 (×4): 1 [drp] via OPHTHALMIC
  Filled 2019-11-23: qty 2.5

## 2019-11-23 MED ORDER — METOPROLOL SUCCINATE ER 50 MG PO TB24
200.0000 mg | ORAL_TABLET | Freq: Every day | ORAL | Status: DC
  Administered 2019-11-23 – 2019-11-26 (×4): 200 mg via ORAL
  Filled 2019-11-23 (×5): qty 4

## 2019-11-23 MED ORDER — CHLORHEXIDINE GLUCONATE CLOTH 2 % EX PADS
6.0000 | MEDICATED_PAD | Freq: Every day | CUTANEOUS | Status: DC
  Administered 2019-11-23 – 2019-11-27 (×5): 6 via TOPICAL

## 2019-11-23 MED ORDER — METOPROLOL SUCCINATE ER 25 MG PO TB24
37.5000 mg | ORAL_TABLET | Freq: Every day | ORAL | Status: DC
  Administered 2019-11-23 – 2019-11-27 (×5): 37.5 mg via ORAL
  Filled 2019-11-23 (×6): qty 2

## 2019-11-23 MED ORDER — ACETAMINOPHEN 325 MG PO TABS
650.0000 mg | ORAL_TABLET | Freq: Four times a day (QID) | ORAL | Status: DC | PRN
  Administered 2019-11-23: 650 mg via ORAL
  Filled 2019-11-23 (×2): qty 2

## 2019-11-23 MED ORDER — POTASSIUM CHLORIDE CRYS ER 20 MEQ PO TBCR
40.0000 meq | EXTENDED_RELEASE_TABLET | Freq: Every day | ORAL | Status: DC
  Administered 2019-11-23 – 2019-11-26 (×4): 40 meq via ORAL
  Filled 2019-11-23 (×6): qty 2

## 2019-11-23 MED ORDER — FLUTICASONE PROPIONATE 50 MCG/ACT NA SUSP
1.0000 | Freq: Every day | NASAL | Status: DC
  Administered 2019-11-23 – 2019-11-27 (×5): 1 via NASAL
  Filled 2019-11-23 (×2): qty 16

## 2019-11-23 MED ORDER — LORATADINE 10 MG PO TABS
10.0000 mg | ORAL_TABLET | Freq: Every day | ORAL | Status: DC
  Administered 2019-11-24 – 2019-11-26 (×3): 10 mg via ORAL
  Filled 2019-11-23 (×5): qty 1

## 2019-11-23 MED ORDER — NEOMYCIN-POLYMYXIN-HC 3.5-10000-1 OT SOLN: 2.0000 [drp] | Freq: Two times a day (BID) | OTIC | Status: DC | PRN

## 2019-11-23 MED ORDER — SACCHAROMYCES BOULARDII 250 MG PO CAPS
250.0000 mg | ORAL_CAPSULE | Freq: Two times a day (BID) | ORAL | Status: DC
  Administered 2019-11-23 – 2019-11-26 (×9): 250 mg via ORAL
  Filled 2019-11-23 (×10): qty 1

## 2019-11-23 MED ORDER — AZITHROMYCIN 250 MG PO TABS
500.0000 mg | ORAL_TABLET | Freq: Every day | ORAL | Status: DC
  Administered 2019-11-23 – 2019-11-24 (×2): 500 mg via ORAL
  Filled 2019-11-23 (×3): qty 2

## 2019-11-23 MED ORDER — IPRATROPIUM-ALBUTEROL 0.5-2.5 (3) MG/3ML IN SOLN: 3.0000 mL | Freq: Four times a day (QID) | RESPIRATORY_TRACT | Status: DC | PRN

## 2019-11-23 MED ORDER — SODIUM CHLORIDE 0.9 % IV SOLN
2.0000 g | INTRAVENOUS | Status: AC
  Administered 2019-11-23 – 2019-11-24 (×2): 2 g via INTRAVENOUS
  Filled 2019-11-23 (×2): qty 20

## 2019-11-23 MED ORDER — WARFARIN - PHARMACIST DOSING INPATIENT: Freq: Every day | Status: DC

## 2019-11-23 MED ORDER — FERROUS GLUCONATE 324 (38 FE) MG PO TABS
324.0000 mg | ORAL_TABLET | Freq: Two times a day (BID) | ORAL | Status: DC
  Administered 2019-11-23 – 2019-11-26 (×8): 324 mg via ORAL
  Filled 2019-11-23 (×14): qty 1

## 2019-11-23 MED ORDER — METHYLPREDNISOLONE SODIUM SUCC 125 MG IJ SOLR
125.0000 mg | Freq: Once | INTRAMUSCULAR | Status: AC
  Administered 2019-11-23: 125 mg via INTRAVENOUS
  Filled 2019-11-23: qty 2

## 2019-11-23 MED ORDER — LORAZEPAM 0.5 MG PO TABS
0.5000 mg | ORAL_TABLET | Freq: Two times a day (BID) | ORAL | Status: DC | PRN
  Administered 2019-11-24 – 2019-11-26 (×2): 0.5 mg via ORAL
  Filled 2019-11-23 (×2): qty 1

## 2019-11-23 MED ORDER — ASCORBIC ACID 500 MG PO TABS
500.0000 mg | ORAL_TABLET | Freq: Every day | ORAL | Status: DC
  Administered 2019-11-23 – 2019-11-27 (×5): 500 mg via ORAL
  Filled 2019-11-23 (×5): qty 1

## 2019-11-23 MED ORDER — PANTOPRAZOLE SODIUM 40 MG PO TBEC
40.0000 mg | DELAYED_RELEASE_TABLET | Freq: Two times a day (BID) | ORAL | Status: DC
  Administered 2019-11-23 – 2019-11-26 (×8): 40 mg via ORAL
  Filled 2019-11-23 (×9): qty 1

## 2019-11-23 MED ORDER — GABAPENTIN 300 MG PO CAPS
300.0000 mg | ORAL_CAPSULE | Freq: Every day | ORAL | Status: DC
  Administered 2019-11-23 – 2019-11-26 (×4): 300 mg via ORAL
  Filled 2019-11-23 (×4): qty 1

## 2019-11-23 NOTE — Progress Notes (Signed)
CRITICAL VALUE ALERT  Critical Value:  INR 7.3  Date & Time Notied:  11/23/2019 8am  Provider Notified: Dr. Anastasio Champion  Orders Received/Actions taken: In progress

## 2019-11-23 NOTE — Progress Notes (Signed)
ANTICOAGULATION CONSULT NOTE - Initial Consult  Pharmacy Consult for Coumadin Indication: atrial fibrillation  No Known Allergies  Patient Measurements: Height: 5\' 4"  (162.6 cm) Weight: 186 lb 15.2 oz (84.8 kg) IBW/kg (Calculated) : 54.7  Vital Signs: Temp: 97.9 F (36.6 C) (01/21 0236) Temp Source: Oral (01/21 0236) BP: 112/77 (01/21 0236) Pulse Rate: 102 (01/21 0236)  Labs: Recent Labs    11/22/19 1701 11/23/19 0040  HGB 8.0*  --   HCT 24.4*  --   PLT 183  --   LABPROT 57.2*  --   INR 6.5*  --   CREATININE 2.27* 2.15*    Estimated Creatinine Clearance: 23.1 mL/min (A) (by C-G formula based on SCr of 2.15 mg/dL (H)).   Medical History: Past Medical History:  Diagnosis Date  . Adenomatous polyps    -resected via colonoscopy in 2011  . Anemia   . Anxiety    MR  . Atrial fibrillation ([place])   . CHF (congestive heart failure) ([place])   . Chronic kidney disease   . Degenerative joint disease    status post right total hip arthroplasty  . Dysrhythmia   . GERD (gastroesophageal reflux disease)    -Barrett's esophagus  . Glaucoma   . Glaucoma   . Gout   . Hyperlipidemia   . Hypertension   . Mental retardation   . Peripheral neuropathy 09/12/2019  . Pneumonia   . Premature ventricular contractions   . Restless leg syndrome   . Shortness of breath dyspnea   . Wears dentures   . Wears glasses     Medications:  See electronic med rec  Assessment: 79 y.o. F presents with decreased hemoglobin and dark stools. Pt on coumadin PTA for afib.  Home dose: 1.5mg  daily Mon-Fri then none on Sat and Sun  Admission INR 6.5 (supratherapeutic). Hgb 8 (Hgb was 8.4 on 1/16 when pt d/c). Hemoccult negative for bleeding.  Goal of Therapy:  INR 2-3 Monitor platelets by anticoagulation protocol: Yes   Plan:  No coumadin today F/u daily INR F/u Hgb and s/s bleeding  Sherlon Handing, PharmD, BCPS Please see amion for complete clinical pharmacist phone list 11/23/2019,5:36  AM

## 2019-11-23 NOTE — H&P (Signed)
History and Physical    [name] HLK:[id number] DOB: Apr 29, 1941 DOA: 11/22/2019  PCP: Doree Albee, MD   Patient coming from: Home.  I have personally briefly reviewed patient's old medical records in [place]  Chief Complaint: Hypoxia.  HPI: [name] is a 79 y.o. female with medical history significant of adenomatous polyps, anemia, anxiety, mental retardation, chronic atrial fibrillation, chronic diastolic CHF, chronic kidney disease, degenerative joint disease, Barrett's esophagus, glaucoma, gout, hyperlipidemia, hypertension, peripheral neuropathy, history of pneumonia, restless leg syndrome who was admitted from January 5 on till November 18, 2019 due to COVID-19 pneumonia.  Is brought to the emergency department due to hypoxia of 80% on room air.  The patient's O2 sat is currently in in the mid to high 90s on 4 LPM via [place].  She is unable to provide further history at this time.  ED Course: Initial vital signs temperature 98 F, pulse 97, respirations 25, blood pressure 119/95 mmHg and O2 sat 79% on room air.  Her O2 sat with 4 LPM via nasal cannula oxygen has been in the low to high 90s.  She received potassium replacement in the emergency department.    CBC showed a white count of 14.9, hemoglobin 8.0 g/dL and platelets 183.  PT was 57.2 and INR 6.5 SARS antigen was negative.  Fecal occult blood was negative.  Sodium 130, potassium 2.8, chloride 86 and CO2 32 mmol/L.BUN is 41 and creatinine 2.27 mg/dL.  Occult blood was negative.  SARS antigen was negative, but PCR was positive.  Her chest radiograph shows cardiomegaly with vascular congestion and mild diffuse interstitial prominence likely due to pulmonary edema.  There is probably small pleural effusions.  There is increased right basilar airspace.may reflect atelectasis or pneumonia.  Please see image and full radiology report for further detail.  Review of Systems: As per HPI otherwise 10 point review of systems  negative.   Past Medical History:  Diagnosis Date  . Adenomatous polyps    -resected via colonoscopy in 2011  . Anemia   . Anxiety    MR  . Atrial fibrillation ([place])   . CHF (congestive heart failure) ([place])   . Chronic kidney disease   . Degenerative joint disease    status post right total hip arthroplasty  . Dysrhythmia   . GERD (gastroesophageal reflux disease)    -Barrett's esophagus  . Glaucoma   . Glaucoma   . Gout   . Hyperlipidemia   . Hypertension   . Mental retardation   . Peripheral neuropathy 09/12/2019  . Pneumonia   . Premature ventricular contractions   . Restless leg syndrome   . Shortness of breath dyspnea   . Wears dentures   . Wears glasses     Past Surgical History:  Procedure Laterality Date  . BIOPSY  02/22/2019   Procedure: BIOPSY;  Surgeon: Rogene Houston, MD;  [facility];  Service: Endoscopy;;  Esophageal biopies  . CATARACT EXTRACTION  2006   left eye  . COLONOSCOPY W/ POLYPECTOMY  2011  . COLONOSCOPY WITH PROPOFOL N/A 02/22/2019   Procedure: COLONOSCOPY WITH PROPOFOL;  Surgeon: Rogene Houston, MD;  [facility];  Service: Endoscopy;  Laterality: N/A;  . ESOPHAGOGASTRODUODENOSCOPY N/A 01/01/2016   Procedure: ESOPHAGOGASTRODUODENOSCOPY (EGD);  Surgeon: Rogene Houston, MD;  [facility];  Service: Endoscopy;  Laterality: N/A;  200  . ESOPHAGOGASTRODUODENOSCOPY (EGD) WITH PROPOFOL N/A 02/22/2019   Procedure: ESOPHAGOGASTRODUODENOSCOPY (EGD) WITH PROPOFOL;  Surgeon: Rogene Houston, MD;  [facility];  Service: Endoscopy;  Laterality: N/A;  . EYE SURGERY    . JOINT REPLACEMENT     x2  . MULTIPLE TOOTH EXTRACTIONS    . RADIOLOGY WITH ANESTHESIA Right 11/21/2015   Procedure: MRI OF RIGHT KNEE WITHOUT CONTAST    (RADIOLOGY WITH ANESTHESIA);  Surgeon: Medication Radiologist, MD;  Location: [facility];  Service: Radiology;  Laterality: Right;  . RADIOLOGY WITH ANESTHESIA N/A 08/11/2018   Procedure: MRI of  the BRAIN WITH ANESTHESIA WITHOUT CONTRAST;  Surgeon: Radiologist, Medication, MD;  Location: [facility];  Service: Radiology;  Laterality: N/A;  . TONSILLECTOMY    . TOTAL HIP ARTHROPLASTY  2006   Right     reports that she has never smoked. She has never used smokeless tobacco. She reports that she does not drink alcohol or use drugs.  No Known Allergies  Family History  Problem Relation Age of Onset  . Hypertension Mother    Prior to Admission medications   Medication Sig Start Date End Date Taking? Authorizing Provider  acetaminophen (TYLENOL) 500 MG tablet Take 500-1,000 mg by mouth every 6 (six) hours as needed for moderate pain.     [provider]  allopurinol (ZYLOPRIM) 100 MG tablet Take 100 mg by mouth at bedtime.     [provider]  ascorbic acid (VITAMIN C) 500 MG tablet Take 1 tablet (500 mg total) by mouth daily. 11/19/19   Roxan Hockey, MD  Brinzolamide-Brimonidine (SIMBRINZA) 1-0.2 % SUSP Place 1 drop into both eyes 2 (two) times daily.     [provider]  ceFAZolin (ANCEF) IVPB Inject 2 g into the vein every 12 (twelve) hours for 19 days. Indication:  MSSA Bacteremia Last Day of Therapy:  12/06/2019 Labs - Once weekly: every Tuesday  CBC/D and BMP, Labs - Every other week:  Every Other Tuesday ESR and CRP 11/18/19 12/07/19  Roxan Hockey, MD  Cholecalciferol (VITAMIN D3) 5000 units CAPS Take 5,000 Units by mouth daily.     [provider]  ferrous gluconate (FERGON) 324 MG tablet Take 1 tablet (324 mg total) by mouth 2 (two) times daily with a meal. 11/18/19   Emokpae, Courage, MD  fluticasone (FLONASE) 50 MCG/ACT nasal spray Place 1 spray into both nostrils daily.    [provider]  gabapentin (NEURONTIN) 300 MG capsule Take 300 mg at bedtime by mouth. 08/23/17   [provider]  Ipratropium-Albuterol (COMBIVENT) 20-100 MCG/ACT AERS respimat Inhale 1 puff into the lungs every 6 (six) hours as needed for wheezing or  shortness of breath. 11/21/19   Ailene Ards, NP  latanoprost (XALATAN) 0.005 % ophthalmic solution Place 1 drop into both eyes at bedtime.      [provider]  loratadine (CLARITIN) 10 MG tablet Take 10 mg by mouth daily.     [provider]  LORazepam (ATIVAN) 0.5 MG tablet Take 1 tablet (0.5 mg total) by mouth 2 (two) times daily as needed for anxiety. 11/18/19   Roxan Hockey, MD  meclizine (ANTIVERT) 12.5 MG tablet Take 12.5 mg by mouth 2 (two) times daily as needed for dizziness.     [provider]  metoprolol (TOPROL XL) 200 MG 24 hr tablet Take 1 tablet (200 mg total) by mouth See admin instructions. Take along with Toprol-XL 37.5 mg daily for a total of 237.5 mg daily 11/18/19 11/17/20  Roxan Hockey, MD  metoprolol succinate (TOPROL-XL) 25 MG 24 hr tablet Take  1.5 tablets (37.5 mg total) by mouth See admin instructions. Take along with Toprol-XL 200 mg daily for a total of 237.5 mg daily 11/18/19   Roxan Hockey, MD  neomycin-polymyxin-hydrocortisone (CORTISPORIN) OTIC solution INSTILL 2-3 DROPS IN [facility] EAR TWICE DAILY Patient taking differently: Place 2-3 drops into both ears 2 (two) times daily as needed (for ear pain).  09/21/19   Doree Albee, MD  NP THYROID 90 MG tablet Take 1 tablet by mouth once daily Patient taking differently: Take 90 mg by mouth every morning.  09/07/19   Gosrani, Nimish C, MD  ondansetron (ZOFRAN-ODT) 4 MG disintegrating tablet TAKE 1 TABLET BY MOUTH EVERY 4 TO 6 HOURS AS NEEDED Patient taking differently: Take 4 mg by mouth every 4 (four) hours as needed for nausea or vomiting.  10/09/19   Doree Albee, MD  pantoprazole (PROTONIX) 40 MG tablet Take 1 tablet (40 mg total) by mouth 2 (two) times daily before a meal. 11/18/19 11/17/20  Emokpae, Courage, MD  potassium chloride SA (KLOR-CON) 20 MEQ tablet Take 2 tablets (40 mEq total) by mouth daily. 11/22/19   Ailene Ards, NP  saccharomyces boulardii (FLORASTOR) 250 MG capsule  Take 1 capsule (250 mg total) by mouth 2 (two) times daily. 11/18/19   Roxan Hockey, MD  torsemide (DEMADEX) 20 MG tablet Take 1 tablet (20 mg total) by mouth every Monday, Wednesday, and Friday. 11/20/19   Roxan Hockey, MD  warfarin (COUMADIN) 3 MG tablet Take 0.5 tablets (1.5 mg total) by mouth every Monday, Tuesday, Wednesday, Thursday, and Friday. Does not take on Saturday/Sunday 11/20/19   Roxan Hockey, MD  ondansetron (ZOFRAN-ODT) 4 MG disintegrating tablet Take 4 mg by mouth every 4 (four) hours as needed for nausea or vomiting.  03/17/18   [provider]    Physical Exam: Vitals:   11/22/19 2200 11/22/19 2230 11/22/19 2330 11/23/19 0056  BP: 94/69 (!) 88/61 (!) 99/54   Pulse:   (!) 110 (!) 107  Resp: 17 (!) 24 (!) 27 (!) 27  Temp:      TempSrc:      SpO2:    93%    Constitutional: Frail, elderly female, but in NAD. Eyes: PERRL, lids and conjunctivae normal ENMT: Mucous membranes are mildly dry. Posterior pharynx clear of any exudate or lesions. Neck: normal, supple, no masses, no thyromegaly Respiratory: Tachypneic in the mid to high 20s with decreased breath sounds with bilateral wheezing and rhonchi.  No accessory muscle use. Cardiovascular: Irregularly irregular rhythm at 103 bpm, no murmurs / rubs / gallops. No extremity edema. 2+ pedal pulses. No carotid bruits.  Abdomen: Non-distended.  Obese.  Bowel sounds positive.  Soft, no tenderness, no masses palpated. No hepatosplenomegaly. Musculoskeletal: no clubbing / cyanosis. Good ROM, no contractures. Normal muscle tone.  Skin: Extensive area of ecchymosis on left flank area. Neurologic: CN 2-12 grossly intact. Sensation intact, DTR normal.  Generalized weakness. Psychiatric: Alert, oriented x1, partially oriented to place.  Labs on Admission: I have personally reviewed following labs and imaging studies  CBC: Recent Labs  Lab 11/16/19 0501 11/16/19 1736 11/17/19 0256 11/18/19 1231 11/22/19 1701  WBC   --   --  17.4* 18.6* 14.9*  NEUTROABS  --   --   --   --  13.8*  HGB 11.1* 9.6* 9.0* 8.4* 8.0*  HCT 35.1* 29.5* 28.1* 26.0* 24.4*  MCV  --   --  104.5* 104.8* 102.1*  PLT  --   --  222 182 183  Basic Metabolic Panel: Recent Labs  Lab 11/16/19 0501 11/16/19 1042 11/17/19 0256 11/22/19 1701 11/23/19 0040  NA 134* 135 136 130* 129*  K 6.1* 5.5* 4.0 2.8* 3.2*  CL 91* 93* 92* 86* 85*  CO2 30 31 35* 32 32  GLUCOSE 134* 156* 98 95 90  BUN 53* 52* 42* 41* 43*  CREATININE 2.21* 2.22* 1.87* 2.27* 2.15*  CALCIUM 8.8* 8.7* 8.4* 7.8* 8.0*  MG  --   --   --   --  1.4*  PHOS  --   --  2.8  --   --    GFR: Estimated Creatinine Clearance: 23.1 mL/min (A) (by C-G formula based on SCr of 2.15 mg/dL (H)). Liver Function Tests: Recent Labs  Lab 11/17/19 0256 11/23/19 0040  AST  --  15  ALT  --  5  ALKPHOS  --  93  BILITOT  --  0.9  PROT  --  6.0*  ALBUMIN 2.5* 2.1*   No results for input(s): LIPASE, AMYLASE in the last 168 hours. No results for input(s): AMMONIA in the last 168 hours. Coagulation Profile: Recent Labs  Lab 11/16/19 0501 11/16/19 1517 11/17/19 0256 11/18/19 0524 11/22/19 1701  INR 4.5* 4.0* 3.9* 2.7* 6.5*   Cardiac Enzymes: No results for input(s): CKTOTAL, CKMB, CKMBINDEX, TROPONINI in the last 168 hours. BNP (last 3 results) No results for input(s): PROBNP in the last 8760 hours. HbA1C: No results for input(s): HGBA1C in the last 72 hours. CBG: No results for input(s): GLUCAP in the last 168 hours. Lipid Profile: Recent Labs    11/23/19 0040  TRIG 143   Thyroid Function Tests: No results for input(s): TSH, T4TOTAL, FREET4, T3FREE, THYROIDAB in the last 72 hours. Anemia Panel: No results for input(s): VITAMINB12, FOLATE, FERRITIN, TIBC, IRON, RETICCTPCT in the last 72 hours. Urine analysis:    Component Value Date/Time   COLORURINE YELLOW 11/07/2019 [place] 11/07/2019 1454   LABSPEC 1.010 11/07/2019 1454   PHURINE 5.0  11/07/2019 1454   GLUCOSEU NEGATIVE 11/07/2019 1454   HGBUR SMALL (A) 11/07/2019 1454   BILIRUBINUR NEGATIVE 11/07/2019 1454   KETONESUR NEGATIVE 11/07/2019 1454   PROTEINUR NEGATIVE 11/07/2019 1454   NITRITE NEGATIVE 11/07/2019 [place] 11/07/2019 1454    Radiological Exams on Admission: DG Chest Port 1 View  Result Date: 11/22/2019 CLINICAL DATA:  COVID, hypoxia EXAM: PORTABLE CHEST 1 VIEW COMPARISON:  11/13/2019, 11/07/2019, 03/20/2019 FINDINGS: Right upper extremity central venous catheter tip projects over the cavoatrial region. Large hiatal hernia. Suspected small pleural effusions. Increased right basilar airspace disease. Cardiomegaly with vascular congestion and mild diffuse interstitial prominence, favor pulmonary edema. No pneumothorax. IMPRESSION: 1. Cardiomegaly with vascular congestion and mild diffuse interstitial prominence, favor pulmonary edema. Probable small pleural effusions. 2. Increased right basilar airspace disease may reflect superimposed atelectasis or pneumonia 3. Large hiatal hernia Electronically Signed   By: Donavan Foil M.D.   On: 11/22/2019 18:01    11/09/2019 echo IMPRESSIONS  1. Left ventricular ejection fraction, by visual estimation, is approximately 35 to 40%. The left ventricle has moderately decreased function. There is mildly increased left ventricular wall thickness. Images are limited and not all myocardial segments  visualized.  2. The left ventricle demonstrates global hypokinesis.  3. Left ventricular diastolic parameters are indeterminate in the setting of atrial fibrillation.  4. Global right ventricle has mildly reduced systolic function.The right ventricular size is normal. no increase in right ventricular wall thickness.  5. Left  atrial size was moderately dilated.  6. Presence of pericardial fat pad.  7. The mitral valve is grossly normal. Trivial mitral valve regurgitation.  8. The tricuspid valve was grossly normal.  Tricuspid valve regurgitation is mild.  9. Tricuspid valve regurgitation is mild. 10. TR signal is inadequate for assessing pulmonary artery systolic pressure. 11. The inferior vena cava is normal in size with <50% respiratory variability, suggesting right atrial pressure of 8 mmHg. 12. No obvious valvular vegetations.  EKG: Independently reviewed.  Vent. rate 102 BPM PR interval * ms QRS duration 102 ms QT/QTc 323/421 ms P-R-T axes * 115 263 Atrial fibrillation Right axis deviation Nonspecific repol abnormality, diffuse leads Baseline wander in lead(s) V1 V2 V3 V5  Assessment/Plan Principal Problem:   Hypoxia Observation/telemetry. Likely due to bacterial pneumonia. Treated recently for COVID-19 pneumonia. Continue supplemental oxygen. Start ceftriaxone and Zithromax. Solu-Medrol 125 mg IVP x1. Check sputum Gram stain, culture and sensitivity.  Active Problems:   Hypokalemia Replacing. Follow-up potassium level as needed.    Hyponatremia Replacing. Follow-up sodium level.    Hypomagnesemia Replacing. Follow-up level as needed.    Supratherapeutic INR No signs of bleeding. Hold warfarin. Monitor PT/INR.    Unspecified glaucoma Continue Xalatan drops.    GERD (gastroesophageal reflux disease) Protonix.    Atrial fibrillation with RVR (HCC) CHA?DS?-VASc Score of at least 5. Hold warfarin. Continue metoprolol for rate control.    Macrocytic anemia Monitor H&H   DVT prophylaxis: On apixaban. Code Status: DNR. Family Communication:  Disposition Plan: Observation for hypoxia. Consults called:  Admission status: Observation/telemetry.   Reubin Milan MD [facility]  If 7PM-7AM, please contact night-coverage [email or web address]  11/23/2019, 1:18 AM   This document was prepared using Dragon voice recognition software and may contain some unintended transcription errors.

## 2019-11-23 NOTE — Progress Notes (Signed)
I reviewed this patient on rounds.  I have known her for several years as she is my primary care patient in the office.  She has been declining significantly since COVID-19 disease diagnosed just over 2 weeks ago.  Her chest x-ray may also suggest diastolic congestive heart failure so I will start her on Lasix 40 mg IV twice a day. I have had a conversation with the patient's sister and niece who were deeply involved in her care and they understand that she may decline and possibly even die during this hospitalization. Warfarin has been held and she has been given vitamin K 1 mg IV.  Continue to follow INR. She is a DNR.

## 2019-11-23 NOTE — ED Provider Notes (Signed)
[facility] EMERGENCY DEPARTMENT Provider Note   CSN: [id number] Arrival date & time: 11/22/19  1626     History Chief Complaint  Patient presents with  . Low hemoglobin    [name] is a 79 y.o. female.  Patient brought in from home supposedly for low hemoglobin.  Patient was diagnosed with a Covid 19 infection on January 5 patient reportedly had blood work done yesterday and was told her hemoglobin was low and her INR was 5.  Patient has history atrial for been is on Coumadin.  Supposed be taking it 1.5 mg Monday through Friday and not taking it on the weekends.  Patient normally on 3 L of oxygen based on her recent hospitalization.  They stated that her oxygen sats were in the 80 range on room air they put her on 4 L and her sats were 97%.  Patient has a history of developmental delay.  Past medical history also significant for atrial fib hypokalemia is all post to be on supplemental potassium and hypomagnesia.  Patient was admitted January 5 through January 16 here for COVID-19 pneumonia.  But during that hospitalization MRSA bacteremia was identified and patient is receiving Ancef through a PICC line.  Patient was discharged home on 3 L of oxygen palliative care at home.  Patient also history of congestive heart failure.  Patient awake and alert but difficult to hold a conversation with.        Past Medical History:  Diagnosis Date  . Adenomatous polyps    -resected via colonoscopy in 2011  . Anemia   . Anxiety    MR  . Atrial fibrillation ([place])   . CHF (congestive heart failure) ([place])   . Chronic kidney disease   . Degenerative joint disease    status post right total hip arthroplasty  . Dysrhythmia   . GERD (gastroesophageal reflux disease)    -Barrett's esophagus  . Glaucoma   . Glaucoma   . Gout   . Hyperlipidemia   . Hypertension   . Mental retardation   . Peripheral neuropathy 09/12/2019  . Pneumonia   . Premature ventricular contractions   . Restless leg  syndrome   . Shortness of breath dyspnea   . Wears dentures   . Wears glasses     Patient Active Problem List   Diagnosis Date Noted  . Bacteremia due to methicillin susceptible Staphylococcus aureus (MSSA) 11/09/2019  . Pneumonia due to COVID-19 virus 11/07/2019  . Peripheral neuropathy 09/12/2019  . Absolute anemia 02/14/2019  . Barrett's esophagus without dysplasia 02/14/2019  . CHF, acute on chronic ([place]) 04/11/2018  . Atrial fibrillation with RVR ([place]) 04/11/2018  . Electrolyte depletion 09/18/2017  . Elevated alkaline phosphatase level 09/18/2017  . Acute renal failure superimposed on chronic kidney disease ([place])   . Elevated LFTs   . Hypomagnesemia   . Intellectual disability   . Hypokalemia 09/11/2017  . Hyponatremia 09/11/2017  . Acute renal injury ([place]) 09/11/2017  . Dehydration 09/11/2017  . Supratherapeutic INR 09/11/2017  . Hyperbilirubinemia 09/11/2017  . Elevated troponin 09/11/2017  . Vulvovaginitis due to yeast 09/11/2017  . Syncope 12/26/2015  . Chronic anticoagulation 12/20/2012  . Chronic kidney disease, stage 3 12/20/2012  . Anemia, normocytic normochromic 12/20/2012  . History of diagnostic tests 12/20/2012  . Acute on chronic diastolic CHF (congestive heart failure) ([place]) 11/25/2011  . Atrial fibrillation ([place])   . Degenerative joint disease   . GERD (gastroesophageal reflux disease)   . Gout   .  Adenomatous polyps   . GLAUCOMA 06/25/2009    Past Surgical History:  Procedure Laterality Date  . BIOPSY  02/22/2019   Procedure: BIOPSY;  Surgeon: Rogene Houston, MD;  Location: [facility];  Service: Endoscopy;;  Esophageal biopies  . CATARACT EXTRACTION  2006   left eye  . COLONOSCOPY W/ POLYPECTOMY  2011  . COLONOSCOPY WITH PROPOFOL N/A 02/22/2019   Procedure: COLONOSCOPY WITH PROPOFOL;  Surgeon: Rogene Houston, MD;  Location: [facility];  Service: Endoscopy;  Laterality: N/A;  . ESOPHAGOGASTRODUODENOSCOPY N/A 01/01/2016   Procedure:  ESOPHAGOGASTRODUODENOSCOPY (EGD);  Surgeon: Rogene Houston, MD;  Location: [facility];  Service: Endoscopy;  Laterality: N/A;  200  . ESOPHAGOGASTRODUODENOSCOPY (EGD) WITH PROPOFOL N/A 02/22/2019   Procedure: ESOPHAGOGASTRODUODENOSCOPY (EGD) WITH PROPOFOL;  Surgeon: Rogene Houston, MD;  Location[facility];  Service: Endoscopy;  Laterality: N/A;  . EYE SURGERY    . JOINT REPLACEMENT     x2  . MULTIPLE TOOTH EXTRACTIONS    . RADIOLOGY WITH ANESTHESIA Right 11/21/2015   Procedure: MRI OF RIGHT KNEE WITHOUT CONTAST    (RADIOLOGY WITH ANESTHESIA);  Surgeon: Medication Radiologist, MD;  Location: [place];  Service: Radiology;  Laterality: Right;  . RADIOLOGY WITH ANESTHESIA N/A 08/11/2018   Procedure: MRI of the BRAIN WITH ANESTHESIA WITHOUT CONTRAST;  Surgeon: Radiologist, Medication, MD;  Location: [place];  Service: Radiology;  Laterality: N/A;  . TONSILLECTOMY    . TOTAL HIP ARTHROPLASTY  2006   Right     OB History   No obstetric history on file.     Family History  Problem Relation Age of Onset  . Hypertension Mother     Social History   Tobacco Use  . Smoking status: Never Smoker  . Smokeless tobacco: Never Used  Substance Use Topics  . Alcohol use: No  . Drug use: No    Home Medications Prior to Admission medications   Medication Sig Start Date End Date Taking? Authorizing Provider  acetaminophen (TYLENOL) 500 MG tablet Take 500-1,000 mg by mouth every 6 (six) hours as needed for moderate pain.     [provider]  allopurinol (ZYLOPRIM) 100 MG tablet Take 100 mg by mouth at bedtime.     [provider]  ascorbic acid (VITAMIN C) 500 MG tablet Take 1 tablet (500 mg total) by mouth daily. 11/19/19   Roxan Hockey, MD  Brinzolamide-Brimonidine (SIMBRINZA) 1-0.2 % SUSP Place 1 drop into both eyes 2 (two) times daily.     [provider]  ceFAZolin (ANCEF) IVPB Inject 2 g into the vein every 12 (twelve) hours for 19 days. Indication:  MSSA  Bacteremia Last Day of Therapy:  12/06/2019 Labs - Once weekly: every Tuesday  CBC/D and BMP, Labs - Every other week:  Every Other Tuesday ESR and CRP 11/18/19 12/07/19  Roxan Hockey, MD  Cholecalciferol (VITAMIN D3) 5000 units CAPS Take 5,000 Units by mouth daily.     [provider]  ferrous gluconate (FERGON) 324 MG tablet Take 1 tablet (324 mg total) by mouth 2 (two) times daily with a meal. 11/18/19   Emokpae, Courage, MD  fluticasone (FLONASE) 50 MCG/ACT nasal spray Place 1 spray into both nostrils daily.    [provider]  gabapentin (NEURONTIN) 300 MG capsule Take 300 mg at bedtime by mouth. 08/23/17   [provider]  Ipratropium-Albuterol (COMBIVENT) 20-100 MCG/ACT AERS respimat Inhale 1 puff into the lungs every 6 (six) hours as needed for  wheezing or shortness of breath. 11/21/19   Ailene Ards, NP  latanoprost (XALATAN) 0.005 % ophthalmic solution Place 1 drop into both eyes at bedtime.      [provider]  loratadine (CLARITIN) 10 MG tablet Take 10 mg by mouth daily.     [provider]  LORazepam (ATIVAN) 0.5 MG tablet Take 1 tablet (0.5 mg total) by mouth 2 (two) times daily as needed for anxiety. 11/18/19   Roxan Hockey, MD  meclizine (ANTIVERT) 12.5 MG tablet Take 12.5 mg by mouth 2 (two) times daily as needed for dizziness.     [provider]  metoprolol (TOPROL XL) 200 MG 24 hr tablet Take 1 tablet (200 mg total) by mouth See admin instructions. Take along with Toprol-XL 37.5 mg daily for a total of 237.5 mg daily 11/18/19 11/17/20  Roxan Hockey, MD  metoprolol succinate (TOPROL-XL) 25 MG 24 hr tablet Take 1.5 tablets (37.5 mg total) by mouth See admin instructions. Take along with Toprol-XL 200 mg daily for a total of 237.5 mg daily 11/18/19   Roxan Hockey, MD  neomycin-polymyxin-hydrocortisone (CORTISPORIN) OTIC solution INSTILL 2-3 DROPS IN [facility] EAR TWICE DAILY Patient taking differently: Place 2-3 drops into both  ears 2 (two) times daily as needed (for ear pain).  09/21/19   Doree Albee, MD  NP THYROID 90 MG tablet Take 1 tablet by mouth once daily Patient taking differently: Take 90 mg by mouth every morning.  09/07/19   Gosrani, Nimish C, MD  ondansetron (ZOFRAN-ODT) 4 MG disintegrating tablet TAKE 1 TABLET BY MOUTH EVERY 4 TO 6 HOURS AS NEEDED Patient taking differently: Take 4 mg by mouth every 4 (four) hours as needed for nausea or vomiting.  10/09/19   Doree Albee, MD  pantoprazole (PROTONIX) 40 MG tablet Take 1 tablet (40 mg total) by mouth 2 (two) times daily before a meal. 11/18/19 11/17/20  Emokpae, Courage, MD  potassium chloride SA (KLOR-CON) 20 MEQ tablet Take 2 tablets (40 mEq total) by mouth daily. 11/22/19   Ailene Ards, NP  saccharomyces boulardii (FLORASTOR) 250 MG capsule Take 1 capsule (250 mg total) by mouth 2 (two) times daily. 11/18/19   Roxan Hockey, MD  torsemide (DEMADEX) 20 MG tablet Take 1 tablet (20 mg total) by mouth every Monday, Wednesday, and Friday. 11/20/19   Roxan Hockey, MD  warfarin (COUMADIN) 3 MG tablet Take 0.5 tablets (1.5 mg total) by mouth every Monday, Tuesday, Wednesday, Thursday, and Friday. Does not take on Saturday/Sunday 11/20/19   Roxan Hockey, MD  ondansetron (ZOFRAN-ODT) 4 MG disintegrating tablet Take 4 mg by mouth every 4 (four) hours as needed for nausea or vomiting.  03/17/18   [provider]    Allergies    Patient has no known allergies.  Review of Systems   Review of Systems  Unable to perform ROS: Dementia    Physical Exam Updated Vital Signs BP (!) 99/54   Pulse (!) 110   Temp 98 F (36.7 C) (Oral)   Resp (!) 27   SpO2 92%   Physical Exam Vitals and nursing note reviewed.  Constitutional:      General: She is not in acute distress.    Appearance: Normal appearance. She is well-developed.  HENT:     Head: Normocephalic and atraumatic.  Eyes:     Extraocular Movements: Extraocular movements intact.      Conjunctiva/sclera: Conjunctivae normal.     Pupils: Pupils are equal, round, and reactive to light.  Cardiovascular:     Rate and Rhythm: Regular rhythm. Tachycardia present.     Heart sounds: No murmur.  Pulmonary:     Effort: Pulmonary effort is normal. No respiratory distress.     Breath sounds: Normal breath sounds.     Comments: Tachypneic Abdominal:     Palpations: Abdomen is soft.     Tenderness: There is no abdominal tenderness.  Genitourinary:    Rectum: Guaiac result negative.  Musculoskeletal:        General: Normal range of motion.     Cervical back: Neck supple.  Skin:    General: Skin is warm and dry.     Capillary Refill: Capillary refill takes less than 2 seconds.  Neurological:     General: No focal deficit present.     Mental Status: She is alert.     ED Results / Procedures / Treatments   Labs (all labs ordered are listed, but only abnormal results are displayed) Labs Reviewed  BASIC METABOLIC PANEL - Abnormal; Notable for the following components:      Result Value   Sodium 130 (*)    Potassium 2.8 (*)    Chloride 86 (*)    BUN 41 (*)    Creatinine, Ser 2.27 (*)    Calcium 7.8 (*)    GFR calc non Af Amer 20 (*)    GFR calc Af Amer 23 (*)    All other components within normal limits  PROTIME-INR - Abnormal; Notable for the following components:   Prothrombin Time 57.2 (*)    INR 6.5 (*)    All other components within normal limits  CBC WITH DIFFERENTIAL/PLATELET - Abnormal; Notable for the following components:   WBC 14.9 (*)    RBC 2.39 (*)    Hemoglobin 8.0 (*)    HCT 24.4 (*)    MCV 102.1 (*)    RDW 16.4 (*)    Neutro Abs 13.8 (*)    Lymphs Abs 0.4 (*)    Abs Immature Granulocytes 0.32 (*)    All other components within normal limits  BRAIN NATRIURETIC PEPTIDE - Abnormal; Notable for the following components:   B Natriuretic Peptide 563.0 (*)    All other components within normal limits  URINALYSIS, ROUTINE W REFLEX MICROSCOPIC    MAGNESIUM  POC OCCULT BLOOD, ED  TYPE AND SCREEN    EKG EKG Interpretation  Date/Time:  Wednesday November 22 2019 17:14:45 EST Ventricular Rate:  102 PR Interval:    QRS Duration: 102 QT Interval:  323 QTC Calculation: 421 R Axis:   115 Text Interpretation: Atrial fibrillation Right axis deviation Nonspecific repol abnormality, diffuse leads Baseline wander in lead(s) V1 V2 V3 V5 Confirmed by Fredia Sorrow [phone]) on 11/22/2019 6:38:57 PM   Radiology DG Chest Port 1 View  Result Date: 11/22/2019 CLINICAL DATA:  COVID, hypoxia EXAM: PORTABLE CHEST 1 VIEW COMPARISON:  11/13/2019, 11/07/2019, 03/20/2019 FINDINGS: Right upper extremity central venous catheter tip projects over the cavoatrial region. Large hiatal hernia. Suspected small pleural effusions. Increased right basilar airspace disease. Cardiomegaly with vascular congestion and mild diffuse interstitial prominence, favor pulmonary edema. No pneumothorax. IMPRESSION: 1. Cardiomegaly with vascular congestion and mild diffuse interstitial prominence, favor pulmonary edema. Probable small pleural effusions. 2. Increased right basilar airspace disease may reflect superimposed atelectasis or pneumonia 3. Large hiatal hernia Electronically Signed   By: Donavan Foil M.D.   On: 11/22/2019 18:01    Procedures Procedures (including critical care time)  CRITICAL CARE Performed by:  Paul Torpey Total critical care time: 30 minutes Critical care time was exclusive of separately billable procedures and treating other patients. Critical care was necessary to treat or prevent imminent or life-threatening deterioration. Critical care was time spent personally by me on the following activities: development of treatment plan with patient and/or surrogate as well as nursing, discussions with consultants, evaluation of patient's response to treatment, examination of patient, obtaining history from patient or surrogate, ordering and performing  treatments and interventions, ordering and review of laboratory studies, ordering and review of radiographic studies, pulse oximetry and re-evaluation of patient's condition.  Medications Ordered in ED Medications  sodium chloride flush (NS) 0.9 % injection 3 mL (3 mLs Intravenous Given 11/22/19 2156)  potassium chloride 10 mEq in 100 mL IVPB (0 mEq Intravenous Stopped 11/22/19 2357)  potassium chloride 10 mEq in 100 mL IVPB (0 mEq Intravenous Stopped 11/22/19 2252)    ED Course  I have reviewed the triage vital signs and the nursing notes.  Pertinent labs & imaging results that were available during my care of the patient were reviewed by me and considered in my medical decision making (see chart for details).    MDM Rules/Calculators/A&P                      Patient on supplemental oxygen with good oxygen sats.  Patient at times tachycardic blood pressures have become a little softer.  Patient with known COVID-19 infection chest x-ray raise some concerns about pulmonary edema known history of CHF.  The patient's BNP kind of baseline in the 500 range for her.  Patient is on Demadex.  Patient's INR was significantly elevated at 6.  Hemoccult was negative no active bleeding.  Discussed with hospitalist they will just hold her Coumadin.  Have ordered blood cultures as well as lactic acid.  Patient could have pulmonary embolus but renal function at baseline although not significant from baseline is prohibitive of doing CT angio of the chest.  Patient's potassium here low again at 2.8.  Patient given 2 rounds of 10 mEq of IV potassium.  Chest x-ray raise some concerns for pneumonia.  Patient with a leukocytosis.  INR was 6.5.  Hemoglobin here today was 8.0.  It was 8.4 when she was discharged.  No significant change no evidence of any blood loss.  Stool is mention was Hemoccult negative  Discussed with hospitalist they will readmit.     Final Clinical Impression(s) / ED Diagnoses Final  diagnoses:  Hypokalemia  Elevated INR  Coagulopathy ([place])    Rx / DC Orders ED Discharge Orders    None       Fredia Sorrow, MD 11/23/19 0030

## 2019-11-23 NOTE — Telephone Encounter (Signed)
SW contacted [name] (patient's sister) to schedule palliative care visit. [name] notified that patient is currently admitted to [facility]. SW to follow for dispo plan.

## 2019-11-24 ENCOUNTER — Other Ambulatory Visit: Payer: Self-pay | Admitting: *Deleted

## 2019-11-24 LAB — CBC WITH DIFFERENTIAL/PLATELET
Abs Immature Granulocytes: 0.16 10*3/uL — ABNORMAL HIGH (ref 0.00–0.07)
Basophils Absolute: 0 10*3/uL (ref 0.0–0.1)
Basophils Relative: 0 %
Eosinophils Absolute: 0 10*3/uL (ref 0.0–0.5)
Eosinophils Relative: 0 %
HCT: 26.4 % — ABNORMAL LOW (ref 36.0–46.0)
Hemoglobin: 8.3 g/dL — ABNORMAL LOW (ref 12.0–15.0)
Immature Granulocytes: 1 %
Lymphocytes Relative: 5 %
Lymphs Abs: 0.6 10*3/uL — ABNORMAL LOW (ref 0.7–4.0)
MCH: 32.8 pg (ref 26.0–34.0)
MCHC: 31.4 g/dL (ref 30.0–36.0)
MCV: 104.3 fL — ABNORMAL HIGH (ref 80.0–100.0)
Monocytes Absolute: 0.2 10*3/uL (ref 0.1–1.0)
Monocytes Relative: 1 %
Neutro Abs: 11.1 10*3/uL — ABNORMAL HIGH (ref 1.7–7.7)
Neutrophils Relative %: 93 %
Platelets: 187 10*3/uL (ref 150–400)
RBC: 2.53 MIL/uL — ABNORMAL LOW (ref 3.87–5.11)
RDW: 17.4 % — ABNORMAL HIGH (ref 11.5–15.5)
WBC: 12 10*3/uL — ABNORMAL HIGH (ref 4.0–10.5)
nRBC: 0.3 % — ABNORMAL HIGH (ref 0.0–0.2)

## 2019-11-24 LAB — PHOSPHORUS: Phosphorus: 2.9 mg/dL (ref 2.5–4.6)

## 2019-11-24 LAB — PROTIME-INR
INR: 5 (ref 0.8–1.2)
Prothrombin Time: 46.5 seconds — ABNORMAL HIGH (ref 11.4–15.2)

## 2019-11-24 LAB — COMPREHENSIVE METABOLIC PANEL
ALT: 7 U/L (ref 0–44)
AST: 13 U/L — ABNORMAL LOW (ref 15–41)
Albumin: 2.3 g/dL — ABNORMAL LOW (ref 3.5–5.0)
Alkaline Phosphatase: 106 U/L (ref 38–126)
Anion gap: 15 (ref 5–15)
BUN: 48 mg/dL — ABNORMAL HIGH (ref 8–23)
CO2: 30 mmol/L (ref 22–32)
Calcium: 8.5 mg/dL — ABNORMAL LOW (ref 8.9–10.3)
Chloride: 90 mmol/L — ABNORMAL LOW (ref 98–111)
Creatinine, Ser: 1.86 mg/dL — ABNORMAL HIGH (ref 0.44–1.00)
GFR calc Af Amer: 30 mL/min — ABNORMAL LOW (ref >60)
GFR calc non Af Amer: 26 mL/min — ABNORMAL LOW (ref >60)
Glucose, Bld: 133 mg/dL — ABNORMAL HIGH (ref 70–99)
Potassium: 4 mmol/L (ref 3.5–5.1)
Sodium: 135 mmol/L (ref 135–145)
Total Bilirubin: 0.9 mg/dL (ref 0.3–1.2)
Total Protein: 6.5 g/dL (ref 6.5–8.1)

## 2019-11-24 LAB — C-REACTIVE PROTEIN: CRP: 18.3 mg/dL — ABNORMAL HIGH (ref <1.0)

## 2019-11-24 LAB — D-DIMER, QUANTITATIVE: D-Dimer, Quant: 0.47 ug/mL-FEU (ref 0.00–0.50)

## 2019-11-24 LAB — MAGNESIUM: Magnesium: 1.7 mg/dL (ref 1.7–2.4)

## 2019-11-24 LAB — FERRITIN: Ferritin: 215 ng/mL (ref 11–307)

## 2019-11-24 MED ORDER — ADULT MULTIVITAMIN W/MINERALS CH
1.0000 | ORAL_TABLET | Freq: Every day | ORAL | Status: DC
  Administered 2019-11-24 – 2019-11-26 (×3): 1 via ORAL
  Filled 2019-11-24 (×4): qty 1

## 2019-11-24 MED ORDER — LORAZEPAM 2 MG/ML IJ SOLN
0.5000 mg | Freq: Once | INTRAMUSCULAR | Status: AC
  Administered 2019-11-24: 0.5 mg via INTRAVENOUS
  Filled 2019-11-24: qty 1

## 2019-11-24 MED ORDER — ENSURE ENLIVE PO LIQD
237.0000 mL | Freq: Two times a day (BID) | ORAL | Status: DC
  Administered 2019-11-24 – 2019-11-26 (×3): 237 mL via ORAL

## 2019-11-24 NOTE — Progress Notes (Signed)
Initial Nutrition Assessment  DOCUMENTATION CODES:   Obesity unspecified  INTERVENTION:  Ensure Enlive po BID, each supplement provides 350 kcal and 20 grams of protein  Magic cup BID with lunch and dinner meals, each supplement provides 290 kcal and 9 grams of protein  MVI with minerals daily  Recommend obtaining new wt, current wt suspected to be from last admission.   NUTRITION DIAGNOSIS:   Increased nutrient needs related to catabolic illness(hypoxia secondary to COVID-19 virus) as evidenced by estimated needs.    GOAL:   Patient will meet greater than or equal to 90% of their needs   MONITOR:   Labs, I & O's, Skin, Supplement acceptance, PO intake, Weight trends  REASON FOR ASSESSMENT:   Malnutrition Screening Tool    ASSESSMENT:  RD working remotely.  79 year old female with past medical history of adenomatous polyps, anemia, anxiety, mental retardation, chronic atrial fibrillation, chronic diastolic CHF, CKD, degenerative joint disease, Barrett's esophagus, gout, HLD, HTN, peripheral neuropathy, RLD, admitted 1/5-1/16 secondary to COVID-19 pneumonia and brought to ED due to hypoxia of 80% on room air. In ED, CXR showed cardiomegaly with vascular congestion and mild diffuse interstitial prominence likely due to pulmonary edema.  1/21 per notes, pt seen by MD outpatient for primary care, noted significant decline since COVID-19 diagnosis 2 weeks ago. Guarded prognosis discussed with patient's family. SW contacted patient's sister to schedule PC visit; currently pending at this time.   Patient on [place], no meals documented this admission for review. Suspect patient with poor po given reported significant decline secondary to COVID-19 infection. Will provide supplements as well as MVI to aid with needs.   Mild pitting BLE edema noted per RN flowsheet.  Current wt 84.8 kg (188.56 lbs) Suspect current wt from previous admission, noted 84.8 kg on 1/15. Pt likely has  experienced some weight loss s/p 1/16 hospital discharge. Recommend obtaining new weight to fully assess needs.   I/Os: -320 ml x 24 hrs UOP: 950 ml x 24 hrs  Medications reviewed and include: Vit C 500 mg daily, Zithromax 500 mg daily, Vit D3 5000 units daily, [place], Lasix IV 40 mg every 12 hrs, Protonix, Klor-con, Rocephin  Labs: CBG 94, WBC 12 (H), Hgb 8.3 (L)  NUTRITION - FOCUSED PHYSICAL EXAM: Unable to complete at this time, RD working remotely.  Diet Order:   Diet Order            Diet Heart Room service appropriate? Yes; Fluid consistency: Thin  Diet effective now              EDUCATION NEEDS:   No education needs have been identified at this time  Skin:  Skin Assessment: Reviewed RN Assessment(MASD;perineum)  Last BM:  1/22 (type 6)  Height:   Ht Readings from Last 1 Encounters:  11/23/19 5\' 4"  (1.626 m)    Weight:   Wt Readings from Last 1 Encounters:  11/23/19 84.8 kg    Ideal Body Weight:  54.5 kg  BMI:  Body mass index is 32.09 kg/m.  Estimated Nutritional Needs:   Kcal:  1900-2100  Protein:  110-120  Fluid:  >/= 1.8 L/day   Lajuan Lines, RD, LDN Clinical Nutrition [place] Telephone [phone] After Hours/Weekend Pager: [phone]

## 2019-11-24 NOTE — Progress Notes (Signed)
Notified Dr. Maurene Capes concerning critical INR of 5

## 2019-11-24 NOTE — Progress Notes (Signed)
Patient states she feel like her throat has a knot in it and burning. Patient is also very anxious. PRN ativan given PO and PRN Zofran given PO. Patient states she feels hot all over, her room is very cool and she has all the blankets off of her. Patient skin feels cool to touch. Vitals B/P116/81, pulse 99, resp 20 SP02 95 RA, temp 98.2. She also feel like her heart is beating fast. Will notify MD.

## 2019-11-24 NOTE — Progress Notes (Signed)
Patient Demographics:    [name], is a 79 y.o. female, DOB - [birth year], LJQ:[id number]  Admit date - 11/22/2019   Admitting Physician Reubin Milan, MD  Outpatient Primary MD for the patient is Doree Albee, MD  LOS - 1   Chief Complaint  Patient presents with  . Low hemoglobin        Subjective:    [name] today has no fevers, no emesis,  No chest pain,   --Continues to refuse to eat -Hypoxia and cough and shortness of breath persist  Assessment  & Plan :    Principal Problem:   Hypoxia Active Problems:   Unspecified glaucoma   GERD (gastroesophageal reflux disease)   Hypokalemia   Hyponatremia   Supratherapeutic INR   Hypomagnesemia   Atrial fibrillation with RVR (HCC)   Macrocytic anemia  Brief Summary:- 79 y.o. female with medical history significant of adenomatous polyps, anemia, anxiety, mental retardation, chronic atrial fibrillation, chronic diastolic CHF, chronic kidney disease, degenerative joint disease, Barrett's esophagus, glaucoma, gout, hyperlipidemia, hypertension, peripheral neuropathy, history of pneumonia, restless leg syndrome who was admitted from January 5 on till November 18, 2019 due to COVID-19 pneumonia and MSSA bacteremia, readmitted on 11/23/2019 due to worsening hypoxia  A/p 1)HFrEF--history of combined diastolic systolic dysfunction CHF presenting with acute on chronic HF exacerbation --recent echo with EF of 35 to 40%, No ACE inhibitors, Entresto or ARB or aldactone at this moment given chronic renal failure and fluctuation of Cr.  -IV Lasix 40 mg every 12 hours as ordered -Daily weight and fluid input and output monitoring -Toprol-XL  237.5 mg daily  2) acute on chronic hypoxic respiratory failure--- PTA patient was on 2.5 L of oxygen chronically, currently requiring up to 5 L of oxygen, suspect due to a combination of recent COVID-19 pneumonia  and HF exacerbation  3) social/ethics--- at baseline patient has cognitive deficits since childhood, patient of Dr. Anastasio Champion, she is a DNR/DNI -plan of care as already discussed with patient's sister [name] and patient's niece Ms Eric Form)--- -Patient overall prognosis is very poor especially given very poor oral intake,  palliative care input requested -Family may have to consider transition to hospice if she fails to improve   4) chronic atrial fibrillation-- CHA2DS2-VASc Score of at least 5, hold Coumadin due to supratherapeutic INR--- patient received vitamin K on 11/23/2019, no bleeding noted -continue metoprolol 237.5 mg for rate control -INR is down to 5.0 from 7.3 -INR may remain elevated due to poor oral intake  5)Hyponatremia/hypomagnesemia and hypokalemia--suspect due to dehydration and poor oral intake--- replace and recheck  6)Recent MSSA Bacteremia----continue iv Ancef through 12/06/2019  7) hypothyroidism----stable, continue Armour Thyroid 90 mg daily  8)CKD III--stable, creatinine currently 1.86 which is close to her recent baseline, renally adjust medications, avoid nephrotoxic agents / dehydration  / hypotension  9)physical deconditioning-- -Physical therapy recommended SNF during recent hospitalization -Patient and family at this time declines SNF rehab,  10) chronic anemia--- hemoglobin 8.3 which is close to recent baseline, monitor H&H closely especially given elevated INR  Disposition/Need for in-Hospital Stay- patient unable to be discharged at this time due to --- worsening hypoxia, heart failure requiring IV diuretics --patient and family continues to decline SNF rehab -  Overall prognosis is poor -  Code Status : DNR  Family Communication:     (patient is alert, awake  - Sister and niece-(Carolyn Atkins and patient's niece Ms Eric Form)-  Consults  : Palliative care consult requested again  DVT Prophylaxis  : Coumadin on hold SCDs  Lab  Results  Component Value Date   PLT 187 11/24/2019    Inpatient Medications  Scheduled Meds: . ascorbic acid  500 mg Oral Daily  . azithromycin  500 mg Oral Daily  . Chlorhexidine Gluconate Cloth  6 each Topical Daily  . cholecalciferol  5,000 Units Oral Daily  . feeding supplement (ENSURE ENLIVE)  237 mL Oral BID BM  . ferrous gluconate  324 mg Oral BID WC  . fluticasone  1 spray Each Nare Daily  . furosemide  40 mg Intravenous Q12H  . gabapentin  300 mg Oral QHS  . latanoprost  1 drop Both Eyes QHS  . loratadine  10 mg Oral Daily  . metoprolol  200 mg Oral Daily  . metoprolol succinate  37.5 mg Oral Daily  . multivitamin with minerals  1 tablet Oral Daily  . pantoprazole  40 mg Oral BID AC  . potassium chloride SA  40 mEq Oral Daily  . saccharomyces boulardii  250 mg Oral BID  . thyroid  90 mg Oral QAC breakfast  . Warfarin - Pharmacist Dosing Inpatient   Does not apply q1800   Continuous Infusions: . [START ON 11/28/2019] ceFAZolin    . cefTRIAXone (ROCEPHIN)  IV 2 g (11/24/19 0518)   PRN Meds:.acetaminophen **OR** acetaminophen, ipratropium-albuterol, LORazepam, meclizine, neomycin-polymyxin-hydrocortisone, ondansetron    Anti-infectives (From admission, onward)   Start     Dose/Rate Route Frequency Ordered Stop   11/28/19 1000  ceFAZolin (ANCEF) IVPB 2g/100 mL premix    Note to Pharmacy: Indication:  MSSA Bacteremia Last Day of Therapy:  12/06/2019 Labs - Once weekly: every Tuesday  CBC/D and BMP, Labs - Every other week:  Every Other Tuesday ESR and CRP     2 g 200 mL/hr over 30 Minutes Intravenous Every 12 hours 11/23/19 0548 12/07/19 0959   11/23/19 1000  azithromycin (ZITHROMAX) tablet 500 mg     500 mg Oral Daily 11/23/19 0528 11/28/19 0959   11/23/19 0600  cefTRIAXone (ROCEPHIN) 2 g in sodium chloride 0.9 % 100 mL IVPB     2 g 200 mL/hr over 30 Minutes Intravenous Every 24 hours 11/23/19 0528 11/28/19 0559   11/23/19 0230  ceFAZolin (ANCEF) IVPB 2g/100 mL  premix  Status:  Discontinued    Note to Pharmacy: Indication:  MSSA Bacteremia Last Day of Therapy:  12/06/2019 Labs - Once weekly: every Tuesday  CBC/D and BMP, Labs - Every other week:  Every Other Tuesday ESR and CRP     2 g 200 mL/hr over 30 Minutes Intravenous Every 12 hours 11/23/19 0226 11/23/19 0529        Objective:   Vitals:   11/24/19 0812 11/24/19 0940 11/24/19 1144 11/24/19 1422  BP:  123/75 129/86 139/88  Pulse:  90 91 (!) 105  Resp:   16 20  Temp:    98.3 F (36.8 C)  TempSrc:    Oral  SpO2: 100%  97% 94%  Weight:      Height:        Wt Readings from Last 3 Encounters:  11/23/19 84.8 kg  11/17/19 84.8 kg  08/04/19 82.1 kg     Intake/Output Summary (Last 24 hours)  at 11/24/2019 1834 Last data filed at 11/24/2019 1807 Gross per 24 hour  Intake 270 ml  Output 1800 ml  Net -1530 ml     Physical Exam  Gen:- Awake Alert, resting comfortably HEENT:- [place].AT, No sclera icterus Nose- [place] 5 L.min Neck-Supple Neck,No JVD,.  Lungs-diminished in bases, no wheezing few scattered rhonchi noted  CV- S1, S2 normal, irregular,  abd-  +ve B.Sounds, Abd Soft, No tenderness,    Extremity/Skin:-1+ pitting edema, pedal pulses present  Psych= episodes of disorientation and confusion  neuro-generalized weakness no new focal deficits, no tremors   Data Review:   Micro Results Recent Results (from the past 240 hour(s))  MRSA PCR Screening     Status: None   Collection Time: 11/16/19  3:01 PM   Specimen: Nasal Mucosa; Nasopharyngeal  Result Value Ref Range Status   MRSA by PCR NEGATIVE NEGATIVE Final    Comment:        The GeneXpert MRSA Assay (FDA approved for NASAL specimens only), is one component of a comprehensive MRSA colonization surveillance program. It is not intended to diagnose MRSA infection nor to guide or monitor treatment for MRSA infections. Performed at [facility], [address] [place], [place] [address]   Respiratory Panel by RT PCR (Flu  A&B, Covid) - Nasopharyngeal Swab     Status: Abnormal   Collection Time: 11/23/19 12:27 AM   Specimen: Nasopharyngeal Swab  Result Value Ref Range Status   SARS Coronavirus 2 by RT PCR POSITIVE (A) NEGATIVE Final    Comment: RESULT CALLED TO, READ BACK BY AND VERIFIED WITH: T HAIRSTON,RN @03231 /21/21 MKELLY (NOTE) SARS-CoV-2 target nucleic acids are DETECTED. SARS-CoV-2 RNA is generally detectable in upper respiratory specimens  during the acute phase of infection. Positive results are indicative of the presence of the identified virus, but do not rule out bacterial infection or co-infection with other pathogens not detected by the test. Clinical correlation with patient history and other diagnostic information is necessary to determine patient infection status. The expected result is Negative. Fact Sheet for Patients:  PinkCheek.be Fact Sheet for Healthcare Providers: GravelBags.it This test is not yet approved or cleared by the [place] FDA and  has been authorized for detection and/or diagnosis of SARS-CoV-2 by FDA under an Emergency Use Authorization (EUA).  This EUA will remain in effect (meaning this test can be used) for  the duration of  the COVID-19 declaration under Section 564(b)(1) of the Act, 21 U.S.C. section 360bbb-3(b)(1), unless the authorization is terminated or revoked sooner.    Influenza A by PCR NEGATIVE NEGATIVE Final   Influenza B by PCR NEGATIVE NEGATIVE Final    Comment: (NOTE) The Xpert Xpress SARS-CoV-2/FLU/RSV assay is intended as an aid in  the diagnosis of influenza from Nasopharyngeal swab specimens and  should not be used as a sole basis for treatment. Nasal washings and  aspirates are unacceptable for Xpert Xpress SARS-CoV-2/FLU/RSV  testing. Fact Sheet for Patients: PinkCheek.be Fact Sheet for Healthcare  Providers: GravelBags.it This test is not yet approved or cleared by the [place] FDA and  has been authorized for detection and/or diagnosis of SARS-CoV-2 by  FDA under an Emergency Use Authorization (EUA). This EUA will remain  in effect (meaning this test can be used) for the duration of the  Covid-19 declaration under Section 564(b)(1) of the Act, 21  U.S.C. section 360bbb-3(b)(1), unless the authorization is  terminated or revoked. Performed at [facility], [address] [place], [place] [address]  Culture, blood (Routine X 2) w Reflex to ID Panel     Status: None (Preliminary result)   Collection Time: 11/23/19 12:38 AM   Specimen: BLOOD  Result Value Ref Range Status   Specimen Description BLOOD PICC LINE  Final   Special Requests   Final    BOTTLES DRAWN AEROBIC AND ANAEROBIC Blood Culture adequate volume   Culture   Final    NO GROWTH 1 DAY Performed at [facility], [address], [place], [place] [address]    Report Status PENDING  Incomplete  Culture, blood (Routine X 2) w Reflex to ID Panel     Status: None (Preliminary result)   Collection Time: 11/23/19 12:45 AM   Specimen: BLOOD  Result Value Ref Range Status   Specimen Description BLOOD RIGHT ANTECUBITAL  Final   Special Requests   Final    BOTTLES DRAWN AEROBIC AND ANAEROBIC Blood Culture adequate volume   Culture   Final    NO GROWTH 1 DAY Performed at [facility], [address], [place], [place] [address]    Report Status PENDING  Incomplete    Radiology Reports DG Chest Port 1 View  Result Date: 11/22/2019 CLINICAL DATA:  COVID, hypoxia EXAM: PORTABLE CHEST 1 VIEW COMPARISON:  11/13/2019, 11/07/2019, 03/20/2019 FINDINGS: Right upper extremity central venous catheter tip projects over the cavoatrial region. Large hiatal hernia. Suspected small pleural effusions. Increased right basilar airspace disease. Cardiomegaly with vascular congestion and mild diffuse interstitial  prominence, favor pulmonary edema. No pneumothorax. IMPRESSION: 1. Cardiomegaly with vascular congestion and mild diffuse interstitial prominence, favor pulmonary edema. Probable small pleural effusions. 2. Increased right basilar airspace disease may reflect superimposed atelectasis or pneumonia 3. Large hiatal hernia Electronically Signed   By: Donavan Foil M.D.   On: 11/22/2019 18:01   DG CHEST PORT 1 VIEW  Result Date: 11/13/2019 CLINICAL DATA:  79 year old female with positive COVID-19 status post central line placement. EXAM: PORTABLE CHEST 1 VIEW COMPARISON:  Chest radiograph dated 11/07/2019. FINDINGS: Right-sided PICC with tip in the region of the cavoatrial junction. There is cardiomegaly with mild vascular congestion. Diffuse bilateral interstitial prominence may represent mild edema or pneumonia. Clinical correlation is recommended. A small left pleural effusion may be present. No pneumothorax. No acute osseous pathology. IMPRESSION: 1. Right-sided PICC with tip over the cavoatrial junction. 2. Cardiomegaly with vascular congestion. 3. Interstitial prominence may represent edema versus pneumonia. Clinical correlation is recommended. Electronically Signed   By: Anner Crete M.D.   On: 11/13/2019 19:52   DG Chest Port 1 View  Result Date: 11/07/2019 CLINICAL DATA:  Hypoxia.  COVID 19 positive EXAM: PORTABLE CHEST 1 VIEW COMPARISON:  03/20/2019 FINDINGS: Diffuse bilateral airspace disease most prominent in the left lung base. Probable bilateral pleural effusions. Very large hiatal hernia best seen on prior CT. Cardiac enlargement with vascular congestion. IMPRESSION: Diffuse bilateral airspace disease with small effusions. Left lower lobe consolidation. Findings most typical for heart failure with edema however given the COVID-19 status, pneumonia is quite possible. Electronically Signed   By: Franchot Gallo M.D.   On: 11/07/2019 15:18   DG ABD ACUTE 2+V W 1V CHEST  Result Date:  11/15/2019 CLINICAL DATA:  Abdomen pain EXAM: DG ABDOMEN ACUTE W/ 1V CHEST COMPARISON:  11/13/2019 chest x-ray, 11/07/2019 FINDINGS: Single view chest demonstrates cardiac enlargement. Large hiatal hernia. Aortic atherosclerosis. Patchy foci of airspace disease at the periphery of the lung bases with overall improved aeration. Supine and upright views of the abdomen demonstrate no  free air beneath the diaphragm. Gas-filled loops of borderline to mildly distended central small bowel with relative absence of distal and colon bowel gas. Postsurgical changes of both hips IMPRESSION: 1. Small amount of airspace disease at the periphery of the right base with overall improved aeration since 11/13/2019. Enlarged cardiac silhouette. Large hiatal hernia 2. Air-filled borderline to mildly distended central small bowel with relative absence of colon gas, mild or developing bowel obstruction is considered. Electronically Signed   By: Donavan Foil M.D.   On: 11/15/2019 18:56   ECHOCARDIOGRAM LIMITED  Result Date: 11/09/2019   ECHOCARDIOGRAM LIMITED REPORT   Patient Name:   BREUNA LOVEALL [facility] Date of Exam: 11/09/2019 Medical Rec #:  [id number]     Height:       68.5 in Accession #:    [id number]    Weight:       189.2 lb Date of Birth:  December 31, 1940      BSA:          2.01 m Patient Age:    72 years      BP:           174/90 mmHg Patient Gender: F             HR:           106 bpm. Exam Location:  [facility]  Procedure: Limited Echo Indications:    Bacteremia  History:        Patient has prior history of Echocardiogram examinations, most                 recent 04/12/2018. Chronic Anticoagulation, COVID 19, GERD, Acute                 Rena l Injury.  Sonographer:    Leavy Cella RDCS (AE) Referring Phys: [place]  1. Left ventricular ejection fraction, by visual estimation, is approximately 35 to 40%. The left ventricle has moderately decreased function. There is mildly increased left ventricular wall thickness.  Images are limited and not all myocardial segments visualized.  2. The left ventricle demonstrates global hypokinesis.  3. Left ventricular diastolic parameters are indeterminate in the setting of atrial fibrillation.  4. Global right ventricle has mildly reduced systolic function.The right ventricular size is normal. no increase in right ventricular wall thickness.  5. Left atrial size was moderately dilated.  6. Presence of pericardial fat pad.  7. The mitral valve is grossly normal. Trivial mitral valve regurgitation.  8. The tricuspid valve was grossly normal. Tricuspid valve regurgitation is mild.  9. Tricuspid valve regurgitation is mild. 10. TR signal is inadequate for assessing pulmonary artery systolic pressure. 11. The inferior vena cava is normal in size with <50% respiratory variability, suggesting right atrial pressure of 8 mmHg. 12. No obvious valvular vegetations. FINDINGS  Left Ventricle: Left ventricular ejection fraction, by visual estimation, is 35 to 40%. The left ventricle has moderately decreased function. The left ventricle demonstrates global hypokinesis. The left ventricular internal cavity size was the left ventricle is normal in size. There is mildly increased left ventricular wall thickness. Left ventricular diastolic parameters are indeterminate. Right Ventricle: The right ventricular size is normal. No increase in right ventricular wall thickness. Global RV systolic function is has mildly reduced systolic function. Left Atrium: Left atrial size was moderately dilated. Right Atrium: Right atrial size was normal in size. Right atrial pressure is estimated at 8 mmHg. Pericardium: There is no evidence of pericardial effusion is seen. There  is no evidence of pericardial effusion. Presence of pericardial fat pad. Mitral Valve: The mitral valve is grossly normal. There is mild calcification of the mitral valve leaflet(s). MV Area by PHT, 5.54 cm. MV PHT, 39.73 msec. Trivial mitral valve  regurgitation. Tricuspid Valve: The tricuspid valve is grossly normal. Tricuspid valve regurgitation is mild. Aortic Valve: The aortic valve is tricuspid. Aortic valve regurgitation is trivial. Mild aortic valve annular calcification. Pulmonic Valve: The pulmonic valve was not well visualized. Pulmonic valve regurgitation is not visualized by color flow Doppler. Pulmonic regurgitation is not visualized by color flow Doppler. Aorta: The aortic root is normal in size and structure. Venous: The inferior vena cava is normal in size with less than 50% respiratory variability, suggesting right atrial pressure of 8 mmHg. Shunts: No atrial level shunt detected by color flow Doppler.  LEFT VENTRICLE         Normals PLAX 2D LVIDd:         4.04 cm 3.6 cm   Diastology                 Normals LVIDs:         2.79 cm 1.7 cm   LV e' lateral:   9.57 cm/s 6.42 cm/s LV PW:         1.36 cm 1.4 cm   LV E/e' lateral: 8.7       15.4 LV IVS:        1.26 cm 1.3 cm   LV e' medial:    6.42 cm/s 6.96 cm/s LV SV:         42 ml   79 ml    LV E/e' medial:  12.9      6.96 LV SV Index:   20.62   45 ml/m2  LEFT ATRIUM           Index       RIGHT ATRIUM           Index LA diam:      3.80 cm 1.89 cm/m  RA Area:     19.50 cm LA Vol (A2C): 43.6 ml 21.73 ml/m RA Volume:   54.30 ml  27.06 ml/m LA Vol (A4C): 96.4 ml 48.04 ml/m   AORTA                 Normals Ao Root diam: 2.80 cm 31 mm MITRAL VALVE              Normals MV Area (PHT): 5.54 cm MV PHT:        39.73 msec 55 ms MV Decel Time: 137 msec   187 ms MV E velocity: 82.90 cm/s 103 cm/s MV A velocity: 36.40 cm/s 70.3 cm/s MV E/A ratio:  2.28       1.5  Rozann Lesches MD Electronically signed by Rozann Lesches MD Signature Date/Time: 11/09/2019/11:19:40 AMThe mitral valve is grossly normal.    Final    [place] EKG SITE RITE  Result Date: 11/13/2019 If Site Rite image not attached, placement could not be confirmed due to current cardiac rhythm.    CBC Recent Labs  Lab 11/18/19 1231  11/22/19 1701 11/23/19 0716 11/24/19 0638  WBC 18.6* 14.9* 16.3* 12.0*  HGB 8.4* 8.0* 8.4* 8.3*  HCT 26.0* 24.4* 26.2* 26.4*  PLT 182 183 199 187  MCV 104.8* 102.1* 102.3* 104.3*  MCH 33.9 33.5 32.8 32.8  MCHC 32.3 32.8 32.1 31.4  RDW 16.0* 16.4* 16.9* 17.4*  LYMPHSABS  --  0.4*  --  0.6*  MONOABS  --  0.3  --  0.2  EOSABS  --  0.1  --  0.0  BASOSABS  --  0.0  --  0.0    Chemistries  Recent Labs  Lab 11/22/19 1701 11/23/19 0040 11/24/19 0638  NA 130* 129* 135  K 2.8* 3.2* 4.0  CL 86* 85* 90*  CO2 32 32 30  GLUCOSE 95 90 133*  BUN 41* 43* 48*  CREATININE 2.27* 2.15* 1.86*  CALCIUM 7.8* 8.0* 8.5*  MG  --  1.4* 1.7  AST  --  15 13*  ALT  --  5 7  ALKPHOS  --  93 106  BILITOT  --  0.9 0.9   ------------------------------------------------------------------------------------------------------------------ Recent Labs    11/23/19 0040  TRIG 143    No results found for: HGBA1C ------------------------------------------------------------------------------------------------------------------ No results for input(s): TSH, T4TOTAL, T3FREE, THYROIDAB in the last 72 hours.  Invalid input(s): FREET3 ------------------------------------------------------------------------------------------------------------------ Recent Labs    11/23/19 0040 11/24/19 0638  FERRITIN 116 215    Coagulation profile Recent Labs  Lab 11/18/19 0524 11/22/19 1701 11/23/19 0712 11/24/19 0638  INR 2.7* 6.5* 7.3* 5.0*    Recent Labs    11/23/19 0040 11/24/19 0638  DDIMER 0.93* 0.47    Cardiac Enzymes No results for input(s): CKMB, TROPONINI, MYOGLOBIN in the last 168 hours.  Invalid input(s): CK ------------------------------------------------------------------------------------------------------------------    Component Value Date/Time   BNP 563.0 (H) 11/22/2019 1701   BNP 90.6 12/20/2012 1540     [name] M.D on 11/24/2019 at 6:34 PM  Go to [email or web address] - for  contact info  [facility] - Office  [phone]

## 2019-11-24 NOTE — Patient Outreach (Signed)
Red EMMI flag for general discharge received for 11/23/19 Day # 4, scheduled follow up appointment- No.  Outreach call to address and spoke with niece [name] who reports pt is admitted to hospital for low hemoglobin.  RN CM sent in basket to Karie Schwalbe to have EMMI calls stopped.  [name] [facility] [place] Coordinator [phone]

## 2019-11-24 NOTE — Progress Notes (Signed)
Attempted to feed supper and offered all items on tray as well as different snacks family had sent and declined them all.

## 2019-11-24 NOTE — Progress Notes (Signed)
CRITICAL VALUE ALERT  Critical Value: INR 7.3  Date & Time Notied: 11/24/19  Provider Notified: Silas [place]

## 2019-11-24 NOTE — Progress Notes (Signed)
ANTICOAGULATION CONSULT NOTE -  Pharmacy Consult for Coumadin Indication: atrial fibrillation  No Known Allergies  Patient Measurements: Height: 5\' 4"  (162.6 cm) Weight: 186 lb 15.2 oz (84.8 kg) IBW/kg (Calculated) : 54.7  Vital Signs: Temp: 97.8 F (36.6 C) (01/22 0616) BP: 121/76 (01/22 0616) Pulse Rate: 94 (01/22 0616)  Labs: Recent Labs    11/22/19 1701 11/22/19 1701 11/23/19 0040 11/23/19 0712 11/23/19 0716 11/24/19 0638  HGB 8.0*   < >  --   --  8.4* 8.3*  HCT 24.4*  --   --   --  26.2* 26.4*  PLT 183  --   --   --  199 187  LABPROT 57.2*  --   --  62.7*  --  46.5*  INR 6.5*  --   --  7.3*  --  5.0*  CREATININE 2.27*  --  2.15*  --   --  1.86*   < > = values in this interval not displayed.    Estimated Creatinine Clearance: 26.7 mL/min (A) (by C-G formula based on SCr of 1.86 mg/dL (H)).   Medical History: Past Medical History:  Diagnosis Date  . Adenomatous polyps    -resected via colonoscopy in 2011  . Anemia   . Anxiety    MR  . Atrial fibrillation ([place])   . CHF (congestive heart failure) ([place])   . Chronic kidney disease   . Degenerative joint disease    status post right total hip arthroplasty  . Dysrhythmia   . GERD (gastroesophageal reflux disease)    -Barrett's esophagus  . Glaucoma   . Glaucoma   . Gout   . Hyperlipidemia   . Hypertension   . Mental retardation   . Peripheral neuropathy 09/12/2019  . Pneumonia   . Premature ventricular contractions   . Restless leg syndrome   . Shortness of breath dyspnea   . Wears dentures   . Wears glasses     Medications:  See electronic med rec  Assessment: 79 y.o. F presents with decreased hemoglobin and dark stools. Pt on coumadin PTA for afib.  Home dose: 1.5mg  daily Mon-Fri then none on Sat and Sun  Vitamin K 1 mg IV given on 1/21  INR down to 5.0. Hgb 8.3 (Hgb was 8.4 on 1/16 when pt d/c). Hemoccult negative for bleeding.  Goal of Therapy:  INR 2-3 Monitor platelets by  anticoagulation protocol: Yes   Plan:  Hold warfarin x 1 dose. F/u daily INR F/u Hgb and s/s bleeding  Margot Ables, PharmD Clinical Pharmacist 11/24/2019 8:23 AM

## 2019-11-25 DIAGNOSIS — U071 COVID-19: Principal | ICD-10-CM

## 2019-11-25 DIAGNOSIS — I4891 Unspecified atrial fibrillation: Secondary | ICD-10-CM

## 2019-11-25 LAB — COMPREHENSIVE METABOLIC PANEL
ALT: 7 U/L (ref 0–44)
AST: 12 U/L — ABNORMAL LOW (ref 15–41)
Albumin: 2.4 g/dL — ABNORMAL LOW (ref 3.5–5.0)
Alkaline Phosphatase: 99 U/L (ref 38–126)
Anion gap: 13 (ref 5–15)
BUN: 53 mg/dL — ABNORMAL HIGH (ref 8–23)
CO2: 34 mmol/L — ABNORMAL HIGH (ref 22–32)
Calcium: 9 mg/dL (ref 8.9–10.3)
Chloride: 88 mmol/L — ABNORMAL LOW (ref 98–111)
Creatinine, Ser: 1.79 mg/dL — ABNORMAL HIGH (ref 0.44–1.00)
GFR calc Af Amer: 31 mL/min — ABNORMAL LOW (ref >60)
GFR calc non Af Amer: 27 mL/min — ABNORMAL LOW (ref >60)
Glucose, Bld: 114 mg/dL — ABNORMAL HIGH (ref 70–99)
Potassium: 3.9 mmol/L (ref 3.5–5.1)
Sodium: 135 mmol/L (ref 135–145)
Total Bilirubin: 0.8 mg/dL (ref 0.3–1.2)
Total Protein: 6.5 g/dL (ref 6.5–8.1)

## 2019-11-25 LAB — CBC WITH DIFFERENTIAL/PLATELET
Abs Immature Granulocytes: 0.14 10*3/uL — ABNORMAL HIGH (ref 0.00–0.07)
Basophils Absolute: 0 10*3/uL (ref 0.0–0.1)
Basophils Relative: 0 %
Eosinophils Absolute: 0 10*3/uL (ref 0.0–0.5)
Eosinophils Relative: 0 %
HCT: 28.4 % — ABNORMAL LOW (ref 36.0–46.0)
Hemoglobin: 8.8 g/dL — ABNORMAL LOW (ref 12.0–15.0)
Immature Granulocytes: 1 %
Lymphocytes Relative: 4 %
Lymphs Abs: 0.6 10*3/uL — ABNORMAL LOW (ref 0.7–4.0)
MCH: 32.8 pg (ref 26.0–34.0)
MCHC: 31 g/dL (ref 30.0–36.0)
MCV: 106 fL — ABNORMAL HIGH (ref 80.0–100.0)
Monocytes Absolute: 0.3 10*3/uL (ref 0.1–1.0)
Monocytes Relative: 2 %
Neutro Abs: 12.9 10*3/uL — ABNORMAL HIGH (ref 1.7–7.7)
Neutrophils Relative %: 93 %
Platelets: 190 10*3/uL (ref 150–400)
RBC: 2.68 MIL/uL — ABNORMAL LOW (ref 3.87–5.11)
RDW: 18 % — ABNORMAL HIGH (ref 11.5–15.5)
WBC: 13.9 10*3/uL — ABNORMAL HIGH (ref 4.0–10.5)
nRBC: 0.1 % (ref 0.0–0.2)

## 2019-11-25 LAB — PROTIME-INR
INR: 4.5 (ref 0.8–1.2)
Prothrombin Time: 43 seconds — ABNORMAL HIGH (ref 11.4–15.2)

## 2019-11-25 LAB — PHOSPHORUS: Phosphorus: 4.2 mg/dL (ref 2.5–4.6)

## 2019-11-25 LAB — MAGNESIUM: Magnesium: 1.8 mg/dL (ref 1.7–2.4)

## 2019-11-25 NOTE — Progress Notes (Signed)
Patient Demographics:    [name], is a 79 y.o. female, DOB - May 28, 1941, OEU:[id number]  Admit date - 11/22/2019   Admitting Physician Reubin Milan, MD  Outpatient Primary MD for the patient is Doree Albee, MD  LOS - 2   Chief Complaint  Patient presents with  . Low hemoglobin        Subjective:    [name] today has no fevers, no emesis,  No chest pain,   --Continues to refuse to eat -Hypoxia and cough and shortness of breath persist  Assessment  & Plan :    Principal Problem:   Hypoxia Active Problems:   Unspecified glaucoma   GERD (gastroesophageal reflux disease)   Hypokalemia   Hyponatremia   Supratherapeutic INR   Hypomagnesemia   Atrial fibrillation with RVR (HCC)   Macrocytic anemia  Brief Summary:- 79 y.o. female with medical history significant of adenomatous polyps, anemia, anxiety, mental retardation, chronic atrial fibrillation, chronic diastolic CHF, chronic kidney disease, degenerative joint disease, Barrett's esophagus, glaucoma, gout, hyperlipidemia, hypertension, peripheral neuropathy, history of pneumonia, restless leg syndrome who was admitted from January 5 on till November 18, 2019 due to COVID-19 pneumonia and MSSA bacteremia, readmitted on 11/23/2019 due to worsening hypoxia  A/p 1)HFrEF--history of combined diastolic systolic dysfunction CHF presenting with acute on chronic HF exacerbation --recent echo with EF of 35 to 40%, No ACE inhibitors, Entresto or ARB or aldactone at this moment given chronic renal failure and fluctuation of Cr.  -IV Lasix 40 mg every 12 hours as ordered -Daily weight and fluid input and output monitoring -Toprol-XL  237.5 mg daily  2) acute on chronic hypoxic respiratory failure--- PTA patient was on 2.5 L of oxygen chronically, currently requiring up to 5 L of oxygen, suspect due to a combination of recent COVID-19 pneumonia  and HF exacerbation  3) social/ethics--- at baseline patient has cognitive deficits since childhood, patient of Dr. Anastasio Champion, she is a DNR/DNI -plan of care as already discussed with patient's sister [name] and patient's niece Ms Eric Form)--- -Patient overall prognosis is very poor especially given very poor oral intake,  palliative care input requested -Family may have to consider transition to hospice if she fails to improve   4) chronic atrial fibrillation-- CHA2DS2-VASc Score of at least 5, hold Coumadin due to supratherapeutic INR--- patient received vitamin K on 11/23/2019, no bleeding noted -continue metoprolol 237.5 mg for rate control -INR is down to 5.0 from 7.3 -INR may remain elevated due to poor oral intake  5)Hyponatremia/hypomagnesemia and hypokalemia--suspect due to dehydration and poor oral intake--- replace and recheck  6)Recent MSSA Bacteremia----continue iv Ancef through 12/06/2019  7) hypothyroidism----stable, continue Armour Thyroid 90 mg daily  8)CKD III--stable, creatinine currently 1.86 which is close to her recent baseline, renally adjust medications, avoid nephrotoxic agents / dehydration  / hypotension  9)physical deconditioning-- -Physical therapy recommended SNF during recent hospitalization -Patient and family at this time declines SNF rehab,  10) chronic anemia--- hemoglobin 8.3 which is close to recent baseline, monitor H&H closely especially given elevated INR  Disposition/Need for in-Hospital Stay- patient unable to be discharged at this time due to --- worsening hypoxia, heart failure requiring IV diuretics --patient and family continues to decline SNF rehab -  Overall prognosis is poor -  Code Status : DNR  Family Communication:     (patient is alert, awake  - Sister and niece-(Carolyn Atkins and patient's niece Ms Eric Form)-  Consults  : Palliative care consult requested again  DVT Prophylaxis  : Coumadin on hold SCDs  Lab  Results  Component Value Date   PLT 190 11/25/2019    Inpatient Medications  Scheduled Meds: . ascorbic acid  500 mg Oral Daily  . Chlorhexidine Gluconate Cloth  6 each Topical Daily  . cholecalciferol  5,000 Units Oral Daily  . feeding supplement (ENSURE ENLIVE)  237 mL Oral BID BM  . ferrous gluconate  324 mg Oral BID WC  . fluticasone  1 spray Each Nare Daily  . furosemide  40 mg Intravenous Q12H  . gabapentin  300 mg Oral QHS  . latanoprost  1 drop Both Eyes QHS  . loratadine  10 mg Oral Daily  . metoprolol  200 mg Oral Daily  . metoprolol succinate  37.5 mg Oral Daily  . multivitamin with minerals  1 tablet Oral Daily  . pantoprazole  40 mg Oral BID AC  . potassium chloride SA  40 mEq Oral Daily  . saccharomyces boulardii  250 mg Oral BID  . thyroid  90 mg Oral QAC breakfast  . Warfarin - Pharmacist Dosing Inpatient   Does not apply q1800   Continuous Infusions: . [START ON 11/28/2019] ceFAZolin     PRN Meds:.acetaminophen **OR** acetaminophen, ipratropium-albuterol, LORazepam, meclizine, neomycin-polymyxin-hydrocortisone, ondansetron    Anti-infectives (From admission, onward)   Start     Dose/Rate Route Frequency Ordered Stop   11/28/19 1000  ceFAZolin (ANCEF) IVPB 2g/100 mL premix    Note to Pharmacy: Indication:  MSSA Bacteremia Last Day of Therapy:  12/06/2019 Labs - Once weekly: every Tuesday  CBC/D and BMP, Labs - Every other week:  Every Other Tuesday ESR and CRP     2 g 200 mL/hr over 30 Minutes Intravenous Every 12 hours 11/23/19 0548 12/07/19 0959   11/23/19 1000  azithromycin (ZITHROMAX) tablet 500 mg  Status:  Discontinued     500 mg Oral Daily 11/23/19 0528 11/24/19 1857   11/23/19 0600  cefTRIAXone (ROCEPHIN) 2 g in sodium chloride 0.9 % 100 mL IVPB     2 g 200 mL/hr over 30 Minutes Intravenous Every 24 hours 11/23/19 0528 11/24/19 0548   11/23/19 0230  ceFAZolin (ANCEF) IVPB 2g/100 mL premix  Status:  Discontinued    Note to Pharmacy: Indication:   MSSA Bacteremia Last Day of Therapy:  12/06/2019 Labs - Once weekly: every Tuesday  CBC/D and BMP, Labs - Every other week:  Every Other Tuesday ESR and CRP     2 g 200 mL/hr over 30 Minutes Intravenous Every 12 hours 11/23/19 0226 11/23/19 0529        Objective:   Vitals:   11/25/19 0625 11/25/19 0950 11/25/19 1436 11/25/19 1613  BP: 125/90 (!) 127/95 (!) 131/93 (!) 112/98  Pulse: (!) 101 (!) 107 (!) 104   Resp: 18 16 18    Temp: 97.8 F (36.6 C)  97.7 F (36.5 C)   TempSrc:   Oral   SpO2: 96% 97% (!) 86% 96%  Weight:      Height:        Wt Readings from Last 3 Encounters:  11/25/19 81.2 kg  11/17/19 84.8 kg  08/04/19 82.1 kg     Intake/Output Summary (Last 24 hours) at 11/25/2019 1947 Last data  filed at 11/25/2019 1744 Gross per 24 hour  Intake 590 ml  Output 500 ml  Net 90 ml     Physical Exam  General exam: Sleeping but wakes up to voice Respiratory system: Bilateral rhonchi. Respiratory effort normal. Cardiovascular system:RRR. No murmurs, rubs, gallops. Gastrointestinal system: Abdomen is nondistended, soft and nontender. No organomegaly or masses felt. Normal bowel sounds heard. Central nervous system: No focal neurological deficits. Extremities: No C/C/E, +pedal pulses Skin: No rashes, lesions or ulcers Psychiatry: Speech is difficult to comprehend    Data Review:   Micro Results Recent Results (from the past 240 hour(s))  MRSA PCR Screening     Status: None   Collection Time: 11/16/19  3:01 PM   Specimen: Nasal Mucosa; Nasopharyngeal  Result Value Ref Range Status   MRSA by PCR NEGATIVE NEGATIVE Final    Comment:        The GeneXpert MRSA Assay (FDA approved for NASAL specimens only), is one component of a comprehensive MRSA colonization surveillance program. It is not intended to diagnose MRSA infection nor to guide or monitor treatment for MRSA infections. Performed at [facility], [address] [place], [place] [address]   Respiratory  Panel by RT PCR (Flu A&B, Covid) - Nasopharyngeal Swab     Status: Abnormal   Collection Time: 11/23/19 12:27 AM   Specimen: Nasopharyngeal Swab  Result Value Ref Range Status   SARS Coronavirus 2 by RT PCR POSITIVE (A) NEGATIVE Final    Comment: RESULT CALLED TO, READ BACK BY AND VERIFIED WITH: T HAIRSTON,RN @03231 /21/21 MKELLY (NOTE) SARS-CoV-2 target nucleic acids are DETECTED. SARS-CoV-2 RNA is generally detectable in upper respiratory specimens  during the acute phase of infection. Positive results are indicative of the presence of the identified virus, but do not rule out bacterial infection or co-infection with other pathogens not detected by the test. Clinical correlation with patient history and other diagnostic information is necessary to determine patient infection status. The expected result is Negative. Fact Sheet for Patients:  PinkCheek.be Fact Sheet for Healthcare Providers: GravelBags.it This test is not yet approved or cleared by the [place] FDA and  has been authorized for detection and/or diagnosis of SARS-CoV-2 by FDA under an Emergency Use Authorization (EUA).  This EUA will remain in effect (meaning this test can be used) for  the duration of  the COVID-19 declaration under Section 564(b)(1) of the Act, 21 U.S.C. section 360bbb-3(b)(1), unless the authorization is terminated or revoked sooner.    Influenza A by PCR NEGATIVE NEGATIVE Final   Influenza B by PCR NEGATIVE NEGATIVE Final    Comment: (NOTE) The Xpert Xpress SARS-CoV-2/FLU/RSV assay is intended as an aid in  the diagnosis of influenza from Nasopharyngeal swab specimens and  should not be used as a sole basis for treatment. Nasal washings and  aspirates are unacceptable for Xpert Xpress SARS-CoV-2/FLU/RSV  testing. Fact Sheet for Patients: PinkCheek.be Fact Sheet for Healthcare  Providers: GravelBags.it This test is not yet approved or cleared by the [place] FDA and  has been authorized for detection and/or diagnosis of SARS-CoV-2 by  FDA under an Emergency Use Authorization (EUA). This EUA will remain  in effect (meaning this test can be used) for the duration of the  Covid-19 declaration under Section 564(b)(1) of the Act, 21  U.S.C. section 360bbb-3(b)(1), unless the authorization is  terminated or revoked. Performed at [facility], [address] [place],  [address]   Culture, blood (Routine X 2) w Reflex to  ID Panel     Status: None (Preliminary result)   Collection Time: 11/23/19 12:38 AM   Specimen: BLOOD  Result Value Ref Range Status   Specimen Description BLOOD PICC LINE  Final   Special Requests   Final    BOTTLES DRAWN AEROBIC AND ANAEROBIC Blood Culture adequate volume   Culture   Final    NO GROWTH 2 DAYS Performed at [facility], [address] [place], [place] [address]    Report Status PENDING  Incomplete  Culture, blood (Routine X 2) w Reflex to ID Panel     Status: None (Preliminary result)   Collection Time: 11/23/19 12:45 AM   Specimen: BLOOD  Result Value Ref Range Status   Specimen Description BLOOD RIGHT ANTECUBITAL  Final   Special Requests   Final    BOTTLES DRAWN AEROBIC AND ANAEROBIC Blood Culture adequate volume   Culture   Final    NO GROWTH 2 DAYS Performed at [facility], [address] [place], [place] [address]    Report Status PENDING  Incomplete    Radiology Reports DG Chest Port 1 View  Result Date: 11/22/2019 CLINICAL DATA:  COVID, hypoxia EXAM: PORTABLE CHEST 1 VIEW COMPARISON:  11/13/2019, 11/07/2019, 03/20/2019 FINDINGS: Right upper extremity central venous catheter tip projects over the cavoatrial region. Large hiatal hernia. Suspected small pleural effusions. Increased right basilar airspace disease. Cardiomegaly with vascular congestion and mild diffuse  interstitial prominence, favor pulmonary edema. No pneumothorax. IMPRESSION: 1. Cardiomegaly with vascular congestion and mild diffuse interstitial prominence, favor pulmonary edema. Probable small pleural effusions. 2. Increased right basilar airspace disease may reflect superimposed atelectasis or pneumonia 3. Large hiatal hernia Electronically Signed   By: Donavan Foil M.D.   On: 11/22/2019 18:01   DG CHEST PORT 1 VIEW  Result Date: 11/13/2019 CLINICAL DATA:  79 year old female with positive COVID-19 status post central line placement. EXAM: PORTABLE CHEST 1 VIEW COMPARISON:  Chest radiograph dated 11/07/2019. FINDINGS: Right-sided PICC with tip in the region of the cavoatrial junction. There is cardiomegaly with mild vascular congestion. Diffuse bilateral interstitial prominence may represent mild edema or pneumonia. Clinical correlation is recommended. A small left pleural effusion may be present. No pneumothorax. No acute osseous pathology. IMPRESSION: 1. Right-sided PICC with tip over the cavoatrial junction. 2. Cardiomegaly with vascular congestion. 3. Interstitial prominence may represent edema versus pneumonia. Clinical correlation is recommended. Electronically Signed   By: Anner Crete M.D.   On: 11/13/2019 19:52   DG Chest Port 1 View  Result Date: 11/07/2019 CLINICAL DATA:  Hypoxia.  COVID 19 positive EXAM: PORTABLE CHEST 1 VIEW COMPARISON:  03/20/2019 FINDINGS: Diffuse bilateral airspace disease most prominent in the left lung base. Probable bilateral pleural effusions. Very large hiatal hernia best seen on prior CT. Cardiac enlargement with vascular congestion. IMPRESSION: Diffuse bilateral airspace disease with small effusions. Left lower lobe consolidation. Findings most typical for heart failure with edema however given the COVID-19 status, pneumonia is quite possible. Electronically Signed   By: Franchot Gallo M.D.   On: 11/07/2019 15:18   DG ABD ACUTE 2+V W 1V CHEST  Result Date:  11/15/2019 CLINICAL DATA:  Abdomen pain EXAM: DG ABDOMEN ACUTE W/ 1V CHEST COMPARISON:  11/13/2019 chest x-ray, 11/07/2019 FINDINGS: Single view chest demonstrates cardiac enlargement. Large hiatal hernia. Aortic atherosclerosis. Patchy foci of airspace disease at the periphery of the lung bases with overall improved aeration. Supine and upright views of the abdomen demonstrate no free air beneath the diaphragm. Gas-filled loops of  borderline to mildly distended central small bowel with relative absence of distal and colon bowel gas. Postsurgical changes of both hips IMPRESSION: 1. Small amount of airspace disease at the periphery of the right base with overall improved aeration since 11/13/2019. Enlarged cardiac silhouette. Large hiatal hernia 2. Air-filled borderline to mildly distended central small bowel with relative absence of colon gas, mild or developing bowel obstruction is considered. Electronically Signed   By: Donavan Foil M.D.   On: 11/15/2019 18:56   ECHOCARDIOGRAM LIMITED  Result Date: 11/09/2019   ECHOCARDIOGRAM LIMITED REPORT   Patient Name:   TIWANDA THREATS [facility] Date of Exam: 11/09/2019 Medical Rec #:  [id number]     Height:       68.5 in Accession #:    [id number]    Weight:       189.2 lb Date of Birth:  1941-08-09      BSA:          2.01 m Patient Age:    53 years      BP:           174/90 mmHg Patient Gender: F             HR:           106 bpm. Exam Location:  [facility]  Procedure: Limited Echo Indications:    Bacteremia  History:        Patient has prior history of Echocardiogram examinations, most                 recent 04/12/2018. Chronic Anticoagulation, COVID 19, GERD, Acute                 Rena l Injury.  Sonographer:    Leavy Cella RDCS (AE) Referring Phys: [place]  1. Left ventricular ejection fraction, by visual estimation, is approximately 35 to 40%. The left ventricle has moderately decreased function. There is mildly increased left ventricular wall thickness.  Images are limited and not all myocardial segments visualized.  2. The left ventricle demonstrates global hypokinesis.  3. Left ventricular diastolic parameters are indeterminate in the setting of atrial fibrillation.  4. Global right ventricle has mildly reduced systolic function.The right ventricular size is normal. no increase in right ventricular wall thickness.  5. Left atrial size was moderately dilated.  6. Presence of pericardial fat pad.  7. The mitral valve is grossly normal. Trivial mitral valve regurgitation.  8. The tricuspid valve was grossly normal. Tricuspid valve regurgitation is mild.  9. Tricuspid valve regurgitation is mild. 10. TR signal is inadequate for assessing pulmonary artery systolic pressure. 11. The inferior vena cava is normal in size with <50% respiratory variability, suggesting right atrial pressure of 8 mmHg. 12. No obvious valvular vegetations. FINDINGS  Left Ventricle: Left ventricular ejection fraction, by visual estimation, is 35 to 40%. The left ventricle has moderately decreased function. The left ventricle demonstrates global hypokinesis. The left ventricular internal cavity size was the left ventricle is normal in size. There is mildly increased left ventricular wall thickness. Left ventricular diastolic parameters are indeterminate. Right Ventricle: The right ventricular size is normal. No increase in right ventricular wall thickness. Global RV systolic function is has mildly reduced systolic function. Left Atrium: Left atrial size was moderately dilated. Right Atrium: Right atrial size was normal in size. Right atrial pressure is estimated at 8 mmHg. Pericardium: There is no evidence of pericardial effusion is seen. There is no evidence of pericardial effusion. Presence of  pericardial fat pad. Mitral Valve: The mitral valve is grossly normal. There is mild calcification of the mitral valve leaflet(s). MV Area by PHT, 5.54 cm. MV PHT, 39.73 msec. Trivial mitral valve  regurgitation. Tricuspid Valve: The tricuspid valve is grossly normal. Tricuspid valve regurgitation is mild. Aortic Valve: The aortic valve is tricuspid. Aortic valve regurgitation is trivial. Mild aortic valve annular calcification. Pulmonic Valve: The pulmonic valve was not well visualized. Pulmonic valve regurgitation is not visualized by color flow Doppler. Pulmonic regurgitation is not visualized by color flow Doppler. Aorta: The aortic root is normal in size and structure. Venous: The inferior vena cava is normal in size with less than 50% respiratory variability, suggesting right atrial pressure of 8 mmHg. Shunts: No atrial level shunt detected by color flow Doppler.  LEFT VENTRICLE         Normals PLAX 2D LVIDd:         4.04 cm 3.6 cm   Diastology                 Normals LVIDs:         2.79 cm 1.7 cm   LV e' lateral:   9.57 cm/s 6.42 cm/s LV PW:         1.36 cm 1.4 cm   LV E/e' lateral: 8.7       15.4 LV IVS:        1.26 cm 1.3 cm   LV e' medial:    6.42 cm/s 6.96 cm/s LV SV:         42 ml   79 ml    LV E/e' medial:  12.9      6.96 LV SV Index:   20.62   45 ml/m2  LEFT ATRIUM           Index       RIGHT ATRIUM           Index LA diam:      3.80 cm 1.89 cm/m  RA Area:     19.50 cm LA Vol (A2C): 43.6 ml 21.73 ml/m RA Volume:   54.30 ml  27.06 ml/m LA Vol (A4C): 96.4 ml 48.04 ml/m   AORTA                 Normals Ao Root diam: 2.80 cm 31 mm MITRAL VALVE              Normals MV Area (PHT): 5.54 cm MV PHT:        39.73 msec 55 ms MV Decel Time: 137 msec   187 ms MV E velocity: 82.90 cm/s 103 cm/s MV A velocity: 36.40 cm/s 70.3 cm/s MV E/A ratio:  2.28       1.5  Rozann Lesches MD Electronically signed by Rozann Lesches MD Signature Date/Time: 11/09/2019/11:19:40 AMThe mitral valve is grossly normal.    Final    [place] EKG SITE RITE  Result Date: 11/13/2019 If Site Rite image not attached, placement could not be confirmed due to current cardiac rhythm.    CBC Recent Labs  Lab 11/22/19 1701  11/23/19 0716 11/24/19 0638 11/25/19 0658  WBC 14.9* 16.3* 12.0* 13.9*  HGB 8.0* 8.4* 8.3* 8.8*  HCT 24.4* 26.2* 26.4* 28.4*  PLT 183 199 187 190  MCV 102.1* 102.3* 104.3* 106.0*  MCH 33.5 32.8 32.8 32.8  MCHC 32.8 32.1 31.4 31.0  RDW 16.4* 16.9* 17.4* 18.0*  LYMPHSABS 0.4*  --  0.6* 0.6*  MONOABS 0.3  --  0.2  0.3  EOSABS 0.1  --  0.0 0.0  BASOSABS 0.0  --  0.0 0.0    Chemistries  Recent Labs  Lab 11/22/19 1701 11/23/19 0040 11/24/19 0638 11/25/19 0658  NA 130* 129* 135 135  K 2.8* 3.2* 4.0 3.9  CL 86* 85* 90* 88*  CO2 32 32 30 34*  GLUCOSE 95 90 133* 114*  BUN 41* 43* 48* 53*  CREATININE 2.27* 2.15* 1.86* 1.79*  CALCIUM 7.8* 8.0* 8.5* 9.0  MG  --  1.4* 1.7 1.8  AST  --  15 13* 12*  ALT  --  5 7 7   ALKPHOS  --  93 106 99  BILITOT  --  0.9 0.9 0.8   ------------------------------------------------------------------------------------------------------------------ Recent Labs    11/23/19 0040  TRIG 143    No results found for: HGBA1C ------------------------------------------------------------------------------------------------------------------ No results for input(s): TSH, T4TOTAL, T3FREE, THYROIDAB in the last 72 hours.  Invalid input(s): FREET3 ------------------------------------------------------------------------------------------------------------------ Recent Labs    11/23/19 0040 11/24/19 0638  FERRITIN 116 215    Coagulation profile Recent Labs  Lab 11/22/19 1701 11/23/19 0712 11/24/19 0638 11/25/19 0658  INR 6.5* 7.3* 5.0* 4.5*    Recent Labs    11/23/19 0040 11/24/19 0638  DDIMER 0.93* 0.47    Cardiac Enzymes No results for input(s): CKMB, TROPONINI, MYOGLOBIN in the last 168 hours.  Invalid input(s): CK ------------------------------------------------------------------------------------------------------------------    Component Value Date/Time   BNP 563.0 (H) 11/22/2019 1701   BNP 90.6 12/20/2012 1540     Kathie Dike M.D on 11/25/2019 at 7:47 PM  Go to [email or web address] - for contact info  [facility] - Office  [phone]

## 2019-11-25 NOTE — Progress Notes (Signed)
Critical INR 4.5 down from 5.  Contacted Dr. Roderic Palau

## 2019-11-25 NOTE — Progress Notes (Signed)
ANTICOAGULATION CONSULT NOTE -  Pharmacy Consult for Coumadin Indication: atrial fibrillation  No Known Allergies  Patient Measurements: Height: 5\' 4"  (162.6 cm) Weight: 179 lb 0.2 oz (81.2 kg) IBW/kg (Calculated) : 54.7  Vital Signs: Temp: 97.8 F (36.6 C) (01/23 0625) Temp Source: Axillary (01/22 2036) BP: 125/90 (01/23 0625) Pulse Rate: 101 (01/23 0625)  Labs: Recent Labs    11/22/19 1701 11/22/19 1701 11/23/19 0040 11/23/19 2563 11/23/19 0716 11/23/19 0716 11/24/19 0638 11/25/19 0658  HGB 8.0*   < >  --   --  8.4*   < > 8.3* 8.8*  HCT 24.4*   < >  --   --  26.2*  --  26.4* 28.4*  PLT 183   < >  --   --  199  --  187 190  LABPROT 57.2*  --   --  62.7*  --   --  46.5*  --   INR 6.5*  --   --  7.3*  --   --  5.0*  --   CREATININE 2.27*   < > 2.15*  --   --   --  1.86* 1.79*   < > = values in this interval not displayed.    Estimated Creatinine Clearance: 27.1 mL/min (A) (by C-G formula based on SCr of 1.79 mg/dL (H)).   Medical History: Past Medical History:  Diagnosis Date  . Adenomatous polyps    -resected via colonoscopy in 2011  . Anemia   . Anxiety    MR  . Atrial fibrillation ([place])   . CHF (congestive heart failure) ([place])   . Chronic kidney disease   . Degenerative joint disease    status post right total hip arthroplasty  . Dysrhythmia   . GERD (gastroesophageal reflux disease)    -Barrett's esophagus  . Glaucoma   . Glaucoma   . Gout   . Hyperlipidemia   . Hypertension   . Mental retardation   . Peripheral neuropathy 09/12/2019  . Pneumonia   . Premature ventricular contractions   . Restless leg syndrome   . Shortness of breath dyspnea   . Wears dentures   . Wears glasses     Medications:  See electronic med rec  Assessment: 79 y.o. F presents with decreased hemoglobin and dark stools. Pt on coumadin PTA for afib.  Home dose: 1.5mg  daily Mon-Fri then none on Sat and Sun  Vitamin K 1 mg IV given on 1/21  INR down to 4.5. Hgb 8.8  (Hgb was 8.4 on 1/16 when pt d/c). Hemoccult negative for bleeding.  Goal of Therapy:  INR 2-3 Monitor platelets by anticoagulation protocol: Yes   Plan:  Hold warfarin x 1 dose. F/u daily INR F/u Hgb and s/s bleeding  Thomasenia Sales, PharmD, MBA, BCGP Clinical Pharmacist  11/25/2019 8:28 AM

## 2019-11-25 NOTE — Progress Notes (Addendum)
Recheck of o2 after handwarming and was 94 on 5 liter.  Congested cough and rhonchi.  Requested water and drank small amount with pills.  Refused ensure. Now at [name] more alert and asking for something to drink but refusing ensure and food.  Drank some coke and water

## 2019-11-25 NOTE — Progress Notes (Signed)
Hands and feet mottled more today.  Was alert and refused to eat any food and only wanted water.  Asked for medicine and swallowed pills with no difficulty.  Refused Ensure even with multiple attempts.  BP  127/95. O2 97 on 5 liters

## 2019-11-26 LAB — CBC WITH DIFFERENTIAL/PLATELET
Abs Immature Granulocytes: 0.15 10*3/uL — ABNORMAL HIGH (ref 0.00–0.07)
Basophils Absolute: 0 10*3/uL (ref 0.0–0.1)
Basophils Relative: 0 %
Eosinophils Absolute: 0 10*3/uL (ref 0.0–0.5)
Eosinophils Relative: 0 %
HCT: 25.2 % — ABNORMAL LOW (ref 36.0–46.0)
Hemoglobin: 7.8 g/dL — ABNORMAL LOW (ref 12.0–15.0)
Immature Granulocytes: 1 %
Lymphocytes Relative: 6 %
Lymphs Abs: 0.6 10*3/uL — ABNORMAL LOW (ref 0.7–4.0)
MCH: 33.3 pg (ref 26.0–34.0)
MCHC: 31 g/dL (ref 30.0–36.0)
MCV: 107.7 fL — ABNORMAL HIGH (ref 80.0–100.0)
Monocytes Absolute: 0.4 10*3/uL (ref 0.1–1.0)
Monocytes Relative: 4 %
Neutro Abs: 9.7 10*3/uL — ABNORMAL HIGH (ref 1.7–7.7)
Neutrophils Relative %: 89 %
Platelets: 150 10*3/uL (ref 150–400)
RBC: 2.34 MIL/uL — ABNORMAL LOW (ref 3.87–5.11)
RDW: 18.2 % — ABNORMAL HIGH (ref 11.5–15.5)
WBC: 10.8 10*3/uL — ABNORMAL HIGH (ref 4.0–10.5)
nRBC: 1.1 % — ABNORMAL HIGH (ref 0.0–0.2)

## 2019-11-26 LAB — COMPREHENSIVE METABOLIC PANEL
ALT: 6 U/L (ref 0–44)
AST: 11 U/L — ABNORMAL LOW (ref 15–41)
Albumin: 2.3 g/dL — ABNORMAL LOW (ref 3.5–5.0)
Alkaline Phosphatase: 81 U/L (ref 38–126)
Anion gap: 9 (ref 5–15)
BUN: 64 mg/dL — ABNORMAL HIGH (ref 8–23)
CO2: 35 mmol/L — ABNORMAL HIGH (ref 22–32)
Calcium: 9 mg/dL (ref 8.9–10.3)
Chloride: 93 mmol/L — ABNORMAL LOW (ref 98–111)
Creatinine, Ser: 1.83 mg/dL — ABNORMAL HIGH (ref 0.44–1.00)
GFR calc Af Amer: 30 mL/min — ABNORMAL LOW (ref >60)
GFR calc non Af Amer: 26 mL/min — ABNORMAL LOW (ref >60)
Glucose, Bld: 110 mg/dL — ABNORMAL HIGH (ref 70–99)
Potassium: 4.7 mmol/L (ref 3.5–5.1)
Sodium: 137 mmol/L (ref 135–145)
Total Bilirubin: 0.8 mg/dL (ref 0.3–1.2)
Total Protein: 6 g/dL — ABNORMAL LOW (ref 6.5–8.1)

## 2019-11-26 LAB — MAGNESIUM: Magnesium: 1.8 mg/dL (ref 1.7–2.4)

## 2019-11-26 LAB — PHOSPHORUS: Phosphorus: 3.4 mg/dL (ref 2.5–4.6)

## 2019-11-26 LAB — PROTIME-INR
INR: 3.5 — ABNORMAL HIGH (ref 0.8–1.2)
Prothrombin Time: 34.8 seconds — ABNORMAL HIGH (ref 11.4–15.2)

## 2019-11-26 NOTE — Plan of Care (Signed)
  Problem: Education: Goal: Knowledge of General Education information will improve Description: Including pain rating scale, medication(s)/side effects and non-pharmacologic comfort measures Outcome: Not Progressing   Problem: Health Behavior/Discharge Planning: Goal: Ability to manage health-related needs will improve Outcome: Not Progressing   Problem: Clinical Measurements: Goal: Ability to maintain clinical measurements within normal limits will improve Outcome: Progressing Goal: Will remain free from infection Outcome: Progressing Goal: Diagnostic test results will improve Outcome: Progressing Goal: Respiratory complications will improve Outcome: Progressing Goal: Cardiovascular complication will be avoided Outcome: Progressing   Problem: Activity: Goal: Risk for activity intolerance will decrease Outcome: Progressing   Problem: Nutrition: Goal: Adequate nutrition will be maintained Outcome: Not Progressing   Problem: Coping: Goal: Level of anxiety will decrease Outcome: Progressing   Problem: Elimination: Goal: Will not experience complications related to bowel motility Outcome: Progressing Goal: Will not experience complications related to urinary retention Outcome: Progressing   Problem: Pain Managment: Goal: General experience of comfort will improve Outcome: Progressing   Problem: Safety: Goal: Ability to remain free from injury will improve Outcome: Progressing   Problem: Skin Integrity: Goal: Risk for impaired skin integrity will decrease Outcome: Progressing

## 2019-11-26 NOTE — Progress Notes (Signed)
ANTICOAGULATION CONSULT NOTE -  Pharmacy Consult for Coumadin Indication: atrial fibrillation  No Known Allergies  Patient Measurements: Height: 5\' 4"  (162.6 cm) Weight: 179 lb 0.2 oz (81.2 kg) IBW/kg (Calculated) : 54.7  Vital Signs: Temp: 97.8 F (36.6 C) (01/24 0525) Temp Source: Oral (01/24 0525) BP: 107/78 (01/24 0525) Pulse Rate: 104 (01/24 0525)  Labs: Recent Labs    11/24/19 0638 11/25/19 0658  HGB 8.3* 8.8*  HCT 26.4* 28.4*  PLT 187 190  LABPROT 46.5* 43.0*  INR 5.0* 4.5*  CREATININE 1.86* 1.79*    Estimated Creatinine Clearance: 27.1 mL/min (A) (by C-G formula based on SCr of 1.79 mg/dL (H)).   Medical History: Past Medical History:  Diagnosis Date  . Adenomatous polyps    -resected via colonoscopy in 2011  . Anemia   . Anxiety    MR  . Atrial fibrillation ([place])   . CHF (congestive heart failure) ([place])   . Chronic kidney disease   . Degenerative joint disease    status post right total hip arthroplasty  . Dysrhythmia   . GERD (gastroesophageal reflux disease)    -Barrett's esophagus  . Glaucoma   . Glaucoma   . Gout   . Hyperlipidemia   . Hypertension   . Mental retardation   . Peripheral neuropathy 09/12/2019  . Pneumonia   . Premature ventricular contractions   . Restless leg syndrome   . Shortness of breath dyspnea   . Wears dentures   . Wears glasses     Medications:  See electronic med rec  Assessment: 79 y.o. F presents with decreased hemoglobin and dark stools. Pt on coumadin PTA for afib.  Home dose: 1.5mg  daily Mon-Fri then none on Sat and Sun  Vitamin K 1 mg IV given on 1/21  INR down to 3.5. Hgb 8.8 (Hgb was 8.4 on 1/16 when pt d/c). Hemoccult negative for bleeding.  Goal of Therapy:  INR 2-3 Monitor platelets by anticoagulation protocol: Yes   Plan:  Hold warfarin x 1 dose. F/u daily INR F/u Hgb and s/s bleeding  Thomasenia Sales, PharmD, MBA, BCGP Clinical Pharmacist  11/26/2019 8:35 AM

## 2019-11-26 NOTE — Progress Notes (Signed)
Patient Demographics:    [name], is a 79 y.o. female, DOB - 09/05/1941, YTK:[id number]  Admit date - 11/22/2019   Admitting Physician Reubin Milan, MD  Outpatient Primary MD for the patient is Doree Albee, MD  LOS - 3   Chief Complaint  Patient presents with  . Low hemoglobin        Subjective:    Drenda Richins oral intake remains poor.  She refuses any p.o. intake during my visit.  She does not have any significant complaints.  Still requiring increased supplemental oxygen.  Assessment  & Plan :    Principal Problem:   Hypoxia Active Problems:   Unspecified glaucoma   GERD (gastroesophageal reflux disease)   Hypokalemia   Hyponatremia   Supratherapeutic INR   Hypomagnesemia   Atrial fibrillation with RVR (HCC)   Macrocytic anemia  Brief Summary:- 79 y.o. female with medical history significant of adenomatous polyps, anemia, anxiety, mental retardation, chronic atrial fibrillation, chronic diastolic CHF, chronic kidney disease, degenerative joint disease, Barrett's esophagus, glaucoma, gout, hyperlipidemia, hypertension, peripheral neuropathy, history of pneumonia, restless leg syndrome who was admitted from January 5 on till November 18, 2019 due to COVID-19 pneumonia and MSSA bacteremia, readmitted on 11/23/2019 due to worsening hypoxia  A/p 1)HFrEF--history of combined diastolic systolic dysfunction CHF presenting with acute on chronic HF exacerbation --recent echo with EF of 35 to 40%, No ACE inhibitors, Entresto or ARB or aldactone at this moment given chronic renal failure and fluctuation of Cr.  -IV Lasix 40 mg every 12 hours as ordered -Daily weight and fluid input and output monitoring -Toprol-XL  237.5 mg daily  2) acute on chronic hypoxic respiratory failure--- PTA patient was on 2.5 L of oxygen chronically, currently requiring up to 4 L of oxygen, suspect due to a  combination of recent COVID-19 pneumonia and HF exacerbation  3) social/ethics--- at baseline patient has cognitive deficits since childhood, patient of Dr. Anastasio Champion, she is a DNR/DNI -plan of care as already discussed with patient's sister [name] and patient's niece Ms Eric Form)--- -Patient overall prognosis is very poor especially given very poor oral intake,  palliative care input requested -Family may have to consider transition to hospice if she fails to improve   4) chronic atrial fibrillation-- CHA2DS2-VASc Score of at least 5, hold Coumadin due to supratherapeutic INR--- patient received vitamin K on 11/23/2019, no bleeding noted -continue metoprolol 237.5 mg for rate control -INR is down to 3.5 from 7.3 -INR may remain elevated due to poor oral intake  5)Hyponatremia/hypomagnesemia and hypokalemia--suspect due to dehydration and poor oral intake--- replace and recheck  6)Recent MSSA Bacteremia----continue iv Ancef through 12/06/2019  7) hypothyroidism----stable, continue Armour Thyroid 90 mg daily  8)CKD III--stable, creatinine currently 1.83 which is close to her recent baseline, renally adjust medications, avoid nephrotoxic agents / dehydration  / hypotension  9)physical deconditioning-- -Physical therapy recommended SNF during recent hospitalization -Patient and family at this time declines SNF rehab,  10) chronic anemia--- hemoglobin 8.3 which is close to recent baseline, monitor H&H closely especially given elevated INR  Disposition/Need for in-Hospital Stay- patient unable to be discharged at this time due to --- worsening hypoxia, heart failure requiring IV diuretics --patient and family continues to decline  SNF rehab -Overall prognosis is poor -  Code Status : DNR  Family Communication:     (patient is alert, awake  - Sister and niece-(Carolyn Atkins and patient's niece Ms Eric Form)-  Consults  : Palliative care consult requested again  DVT  Prophylaxis  : Coumadin on hold SCDs  Lab Results  Component Value Date   PLT 150 11/26/2019    Inpatient Medications  Scheduled Meds: . ascorbic acid  500 mg Oral Daily  . Chlorhexidine Gluconate Cloth  6 each Topical Daily  . cholecalciferol  5,000 Units Oral Daily  . feeding supplement (ENSURE ENLIVE)  237 mL Oral BID BM  . ferrous gluconate  324 mg Oral BID WC  . fluticasone  1 spray Each Nare Daily  . furosemide  40 mg Intravenous Q12H  . gabapentin  300 mg Oral QHS  . latanoprost  1 drop Both Eyes QHS  . loratadine  10 mg Oral Daily  . metoprolol  200 mg Oral Daily  . metoprolol succinate  37.5 mg Oral Daily  . multivitamin with minerals  1 tablet Oral Daily  . pantoprazole  40 mg Oral BID AC  . potassium chloride SA  40 mEq Oral Daily  . saccharomyces boulardii  250 mg Oral BID  . thyroid  90 mg Oral QAC breakfast  . Warfarin - Pharmacist Dosing Inpatient   Does not apply q1800   Continuous Infusions: . [START ON 11/28/2019] ceFAZolin     PRN Meds:.acetaminophen **OR** acetaminophen, ipratropium-albuterol, LORazepam, meclizine, neomycin-polymyxin-hydrocortisone, ondansetron    Anti-infectives (From admission, onward)   Start     Dose/Rate Route Frequency Ordered Stop   11/28/19 1000  ceFAZolin (ANCEF) IVPB 2g/100 mL premix    Note to Pharmacy: Indication:  MSSA Bacteremia Last Day of Therapy:  12/06/2019 Labs - Once weekly: every Tuesday  CBC/D and BMP, Labs - Every other week:  Every Other Tuesday ESR and CRP     2 g 200 mL/hr over 30 Minutes Intravenous Every 12 hours 11/23/19 0548 12/07/19 0959   11/23/19 1000  azithromycin (ZITHROMAX) tablet 500 mg  Status:  Discontinued     500 mg Oral Daily 11/23/19 0528 11/24/19 1857   11/23/19 0600  cefTRIAXone (ROCEPHIN) 2 g in sodium chloride 0.9 % 100 mL IVPB     2 g 200 mL/hr over 30 Minutes Intravenous Every 24 hours 11/23/19 0528 11/24/19 0548   11/23/19 0230  ceFAZolin (ANCEF) IVPB 2g/100 mL premix  Status:   Discontinued    Note to Pharmacy: Indication:  MSSA Bacteremia Last Day of Therapy:  12/06/2019 Labs - Once weekly: every Tuesday  CBC/D and BMP, Labs - Every other week:  Every Other Tuesday ESR and CRP     2 g 200 mL/hr over 30 Minutes Intravenous Every 12 hours 11/23/19 0226 11/23/19 0529        Objective:   Vitals:   11/25/19 1959 11/25/19 2209 11/26/19 0525 11/26/19 1938  BP:  125/77 107/78   Pulse:  66 (!) 104   Resp:   20   Temp:  97.7 F (36.5 C) 97.8 F (36.6 C)   TempSrc:  Oral Oral   SpO2: 96% 93% 96% 96%  Weight:      Height:        Wt Readings from Last 3 Encounters:  11/25/19 81.2 kg  11/17/19 84.8 kg  08/04/19 82.1 kg     Intake/Output Summary (Last 24 hours) at 11/26/2019 2035 Last data filed at 11/26/2019 1900  Gross per 24 hour  Intake 340 ml  Output 700 ml  Net -360 ml     Physical Exam  General exam: Wakes up to voice, no distress Respiratory system: Coarse breath sounds at bases. Respiratory effort normal. Cardiovascular system:RRR. No murmurs, rubs, gallops. Gastrointestinal system: Abdomen is nondistended, soft and nontender. No organomegaly or masses felt. Normal bowel sounds heard. Central nervous system:  No focal neurological deficits. Extremities: No C/C/E, +pedal pulses Skin: No rashes, lesions or ulcers Psychiatry: Calm, speech is difficult to comprehend     Data Review:   Micro Results Recent Results (from the past 240 hour(s))  Respiratory Panel by RT PCR (Flu A&B, Covid) - Nasopharyngeal Swab     Status: Abnormal   Collection Time: 11/23/19 12:27 AM   Specimen: Nasopharyngeal Swab  Result Value Ref Range Status   SARS Coronavirus 2 by RT PCR POSITIVE (A) NEGATIVE Final    Comment: RESULT CALLED TO, READ BACK BY AND VERIFIED WITH: T HAIRSTON,RN _0 /21/21 MKELLY (NOTE) SARS-CoV-2 target nucleic acids are DETECTED. SARS-CoV-2 RNA is generally detectable in upper respiratory specimens  during the acute phase of infection.  Positive results are indicative of the presence of the identified virus, but do not rule out bacterial infection or co-infection with other pathogens not detected by the test. Clinical correlation with patient history and other diagnostic information is necessary to determine patient infection status. The expected result is Negative. Fact Sheet for Patients:  PinkCheek.be Fact Sheet for Healthcare Providers: GravelBags.it This test is not yet approved or cleared by the [place] FDA and  has been authorized for detection and/or diagnosis of SARS-CoV-2 by FDA under an Emergency Use Authorization (EUA).  This EUA will remain in effect (meaning this test can be used) for  the duration of  the COVID-19 declaration under Section 564(b)(1) of the Act, 21 U.S.C. section 360bbb-3(b)(1), unless the authorization is terminated or revoked sooner.    Influenza A by PCR NEGATIVE NEGATIVE Final   Influenza B by PCR NEGATIVE NEGATIVE Final    Comment: (NOTE) The Xpert Xpress SARS-CoV-2/FLU/RSV assay is intended as an aid in  the diagnosis of influenza from Nasopharyngeal swab specimens and  should not be used as a sole basis for treatment. Nasal washings and  aspirates are unacceptable for Xpert Xpress SARS-CoV-2/FLU/RSV  testing. Fact Sheet for Patients: PinkCheek.be Fact Sheet for Healthcare Providers: GravelBags.it This test is not yet approved or cleared by the [place] FDA and  has been authorized for detection and/or diagnosis of SARS-CoV-2 by  FDA under an Emergency Use Authorization (EUA). This EUA will remain  in effect (meaning this test can be used) for the duration of the  Covid-19 declaration under Section 564(b)(1) of the Act, 21  U.S.C. section 360bbb-3(b)(1), unless the authorization is  terminated or revoked. Performed at [facility], [address],  [place], [place] [address]   Culture, blood (Routine X 2) w Reflex to ID Panel     Status: None (Preliminary result)   Collection Time: 11/23/19 12:38 AM   Specimen: BLOOD  Result Value Ref Range Status   Specimen Description BLOOD PICC LINE  Final   Special Requests   Final    BOTTLES DRAWN AEROBIC AND ANAEROBIC Blood Culture adequate volume   Culture   Final    NO GROWTH 3 DAYS Performed at [facility], [address] [place], [place] [address]    Report Status PENDING  Incomplete  Culture, blood (Routine X 2) w Reflex to  ID Panel     Status: None (Preliminary result)   Collection Time: 11/23/19 12:45 AM   Specimen: BLOOD  Result Value Ref Range Status   Specimen Description BLOOD RIGHT ANTECUBITAL  Final   Special Requests   Final    BOTTLES DRAWN AEROBIC AND ANAEROBIC Blood Culture adequate volume   Culture   Final    NO GROWTH 3 DAYS Performed at [facility], [address] [place], [place] [address]    Report Status PENDING  Incomplete    Radiology Reports DG Chest Port 1 View  Result Date: 11/22/2019 CLINICAL DATA:  COVID, hypoxia EXAM: PORTABLE CHEST 1 VIEW COMPARISON:  11/13/2019, 11/07/2019, 03/20/2019 FINDINGS: Right upper extremity central venous catheter tip projects over the cavoatrial region. Large hiatal hernia. Suspected small pleural effusions. Increased right basilar airspace disease. Cardiomegaly with vascular congestion and mild diffuse interstitial prominence, favor pulmonary edema. No pneumothorax. IMPRESSION: 1. Cardiomegaly with vascular congestion and mild diffuse interstitial prominence, favor pulmonary edema. Probable small pleural effusions. 2. Increased right basilar airspace disease may reflect superimposed atelectasis or pneumonia 3. Large hiatal hernia Electronically Signed   By: Donavan Foil M.D.   On: 11/22/2019 18:01   DG CHEST PORT 1 VIEW  Result Date: 11/13/2019 CLINICAL DATA:  79 year old female with positive COVID-19 status post central line  placement. EXAM: PORTABLE CHEST 1 VIEW COMPARISON:  Chest radiograph dated 11/07/2019. FINDINGS: Right-sided PICC with tip in the region of the cavoatrial junction. There is cardiomegaly with mild vascular congestion. Diffuse bilateral interstitial prominence may represent mild edema or pneumonia. Clinical correlation is recommended. A small left pleural effusion may be present. No pneumothorax. No acute osseous pathology. IMPRESSION: 1. Right-sided PICC with tip over the cavoatrial junction. 2. Cardiomegaly with vascular congestion. 3. Interstitial prominence may represent edema versus pneumonia. Clinical correlation is recommended. Electronically Signed   By: Anner Crete M.D.   On: 11/13/2019 19:52   DG Chest Port 1 View  Result Date: 11/07/2019 CLINICAL DATA:  Hypoxia.  COVID 19 positive EXAM: PORTABLE CHEST 1 VIEW COMPARISON:  03/20/2019 FINDINGS: Diffuse bilateral airspace disease most prominent in the left lung base. Probable bilateral pleural effusions. Very large hiatal hernia best seen on prior CT. Cardiac enlargement with vascular congestion. IMPRESSION: Diffuse bilateral airspace disease with small effusions. Left lower lobe consolidation. Findings most typical for heart failure with edema however given the COVID-19 status, pneumonia is quite possible. Electronically Signed   By: Franchot Gallo M.D.   On: 11/07/2019 15:18   DG ABD ACUTE 2+V W 1V CHEST  Result Date: 11/15/2019 CLINICAL DATA:  Abdomen pain EXAM: DG ABDOMEN ACUTE W/ 1V CHEST COMPARISON:  11/13/2019 chest x-ray, 11/07/2019 FINDINGS: Single view chest demonstrates cardiac enlargement. Large hiatal hernia. Aortic atherosclerosis. Patchy foci of airspace disease at the periphery of the lung bases with overall improved aeration. Supine and upright views of the abdomen demonstrate no free air beneath the diaphragm. Gas-filled loops of borderline to mildly distended central small bowel with relative absence of distal and colon bowel  gas. Postsurgical changes of both hips IMPRESSION: 1. Small amount of airspace disease at the periphery of the right base with overall improved aeration since 11/13/2019. Enlarged cardiac silhouette. Large hiatal hernia 2. Air-filled borderline to mildly distended central small bowel with relative absence of colon gas, mild or developing bowel obstruction is considered. Electronically Signed   By: Donavan Foil M.D.   On: 11/15/2019 18:56   ECHOCARDIOGRAM LIMITED  Result Date: 11/09/2019   ECHOCARDIOGRAM LIMITED REPORT  Patient Name:   IDOLINA MANTELL [facility] Date of Exam: 11/09/2019 Medical Rec #:  [id number]     Height:       68.5 in Accession #:    [id number]    Weight:       189.2 lb Date of Birth:  November 18, 1940      BSA:          2.01 m Patient Age:    62 years      BP:           174/90 mmHg Patient Gender: F             HR:           106 bpm. Exam Location:  [facility]  Procedure: Limited Echo Indications:    Bacteremia  History:        Patient has prior history of Echocardiogram examinations, most                 recent 04/12/2018. Chronic Anticoagulation, COVID 19, GERD, Acute                 Rena l Injury.  Sonographer:    Leavy Cella RDCS (AE) Referring Phys: [place]  1. Left ventricular ejection fraction, by visual estimation, is approximately 35 to 40%. The left ventricle has moderately decreased function. There is mildly increased left ventricular wall thickness. Images are limited and not all myocardial segments visualized.  2. The left ventricle demonstrates global hypokinesis.  3. Left ventricular diastolic parameters are indeterminate in the setting of atrial fibrillation.  4. Global right ventricle has mildly reduced systolic function.The right ventricular size is normal. no increase in right ventricular wall thickness.  5. Left atrial size was moderately dilated.  6. Presence of pericardial fat pad.  7. The mitral valve is grossly normal. Trivial mitral valve regurgitation.  8. The  tricuspid valve was grossly normal. Tricuspid valve regurgitation is mild.  9. Tricuspid valve regurgitation is mild. 10. TR signal is inadequate for assessing pulmonary artery systolic pressure. 11. The inferior vena cava is normal in size with <50% respiratory variability, suggesting right atrial pressure of 8 mmHg. 12. No obvious valvular vegetations. FINDINGS  Left Ventricle: Left ventricular ejection fraction, by visual estimation, is 35 to 40%. The left ventricle has moderately decreased function. The left ventricle demonstrates global hypokinesis. The left ventricular internal cavity size was the left ventricle is normal in size. There is mildly increased left ventricular wall thickness. Left ventricular diastolic parameters are indeterminate. Right Ventricle: The right ventricular size is normal. No increase in right ventricular wall thickness. Global RV systolic function is has mildly reduced systolic function. Left Atrium: Left atrial size was moderately dilated. Right Atrium: Right atrial size was normal in size. Right atrial pressure is estimated at 8 mmHg. Pericardium: There is no evidence of pericardial effusion is seen. There is no evidence of pericardial effusion. Presence of pericardial fat pad. Mitral Valve: The mitral valve is grossly normal. There is mild calcification of the mitral valve leaflet(s). MV Area by PHT, 5.54 cm. MV PHT, 39.73 msec. Trivial mitral valve regurgitation. Tricuspid Valve: The tricuspid valve is grossly normal. Tricuspid valve regurgitation is mild. Aortic Valve: The aortic valve is tricuspid. Aortic valve regurgitation is trivial. Mild aortic valve annular calcification. Pulmonic Valve: The pulmonic valve was not well visualized. Pulmonic valve regurgitation is not visualized by color flow Doppler. Pulmonic regurgitation is not visualized by color flow Doppler. Aorta: The aortic root is normal  in size and structure. Venous: The inferior vena cava is normal in size with  less than 50% respiratory variability, suggesting right atrial pressure of 8 mmHg. Shunts: No atrial level shunt detected by color flow Doppler.  LEFT VENTRICLE         Normals PLAX 2D LVIDd:         4.04 cm 3.6 cm   Diastology                 Normals LVIDs:         2.79 cm 1.7 cm   LV e' lateral:   9.57 cm/s 6.42 cm/s LV PW:         1.36 cm 1.4 cm   LV E/e' lateral: 8.7       15.4 LV IVS:        1.26 cm 1.3 cm   LV e' medial:    6.42 cm/s 6.96 cm/s LV SV:         42 ml   79 ml    LV E/e' medial:  12.9      6.96 LV SV Index:   20.62   45 ml/m2  LEFT ATRIUM           Index       RIGHT ATRIUM           Index LA diam:      3.80 cm 1.89 cm/m  RA Area:     19.50 cm LA Vol (A2C): 43.6 ml 21.73 ml/m RA Volume:   54.30 ml  27.06 ml/m LA Vol (A4C): 96.4 ml 48.04 ml/m   AORTA                 Normals Ao Root diam: 2.80 cm 31 mm MITRAL VALVE              Normals MV Area (PHT): 5.54 cm MV PHT:        39.73 msec 55 ms MV Decel Time: 137 msec   187 ms MV E velocity: 82.90 cm/s 103 cm/s MV A velocity: 36.40 cm/s 70.3 cm/s MV E/A ratio:  2.28       1.5  Rozann Lesches MD Electronically signed by Rozann Lesches MD Signature Date/Time: 11/09/2019/11:19:40 AMThe mitral valve is grossly normal.    Final    [place] EKG SITE RITE  Result Date: 11/13/2019 If Site Rite image not attached, placement could not be confirmed due to current cardiac rhythm.    CBC Recent Labs  Lab 11/22/19 1701 11/23/19 0716 11/24/19 0638 11/25/19 0658 11/26/19 0836  WBC 14.9* 16.3* 12.0* 13.9* 10.8*  HGB 8.0* 8.4* 8.3* 8.8* 7.8*  HCT 24.4* 26.2* 26.4* 28.4* 25.2*  PLT 183 199 187 190 150  MCV 102.1* 102.3* 104.3* 106.0* 107.7*  MCH 33.5 32.8 32.8 32.8 33.3  MCHC 32.8 32.1 31.4 31.0 31.0  RDW 16.4* 16.9* 17.4* 18.0* 18.2*  LYMPHSABS 0.4*  --  0.6* 0.6* 0.6*  MONOABS 0.3  --  0.2 0.3 0.4  EOSABS 0.1  --  0.0 0.0 0.0  BASOSABS 0.0  --  0.0 0.0 0.0    Chemistries  Recent Labs  Lab 11/22/19 1701 11/23/19 0040 11/24/19 0638  11/25/19 0658 11/26/19 0836  NA 130* 129* 135 135 137  K 2.8* 3.2* 4.0 3.9 4.7  CL 86* 85* 90* 88* 93*  CO2 32 32 30 34* 35*  GLUCOSE 95 90 133* 114* 110*  BUN 41* 43* 48* 53* 64*  CREATININE 2.27* 2.15* 1.86* 1.79*  1.83*  CALCIUM 7.8* 8.0* 8.5* 9.0 9.0  MG  --  1.4* 1.7 1.8 1.8  AST  --  15 13* 12* 11*  ALT  --  _0 ALKPHOS  --  93 106 99 81  BILITOT  --  0.9 0.9 0.8 0.8   ------------------------------------------------------------------------------------------------------------------ No results for input(s): CHOL, HDL, LDLCALC, TRIG, CHOLHDL, LDLDIRECT in the last 72 hours.  No results found for: HGBA1C ------------------------------------------------------------------------------------------------------------------ No results for input(s): TSH, T4TOTAL, T3FREE, THYROIDAB in the last 72 hours.  Invalid input(s): FREET3 ------------------------------------------------------------------------------------------------------------------ Recent Labs    11/24/19 0638  FERRITIN 215    Coagulation profile Recent Labs  Lab 11/22/19 1701 11/23/19 0712 11/24/19 0638 11/25/19 0658 11/26/19 0836  INR 6.5* 7.3* 5.0* 4.5* 3.5*    Recent Labs    11/24/19 3200  DDIMER 0.47    Cardiac Enzymes No results for input(s): CKMB, TROPONINI, MYOGLOBIN in the last 168 hours.  Invalid input(s): CK ------------------------------------------------------------------------------------------------------------------    Component Value Date/Time   BNP 563.0 (H) 11/22/2019 1701   BNP 90.6 12/20/2012 1540     Kathie Dike M.D on 11/26/2019 at 8:35 PM  Go to [email or web address] - for contact info  [facility] - Office  [phone]

## 2019-11-27 ENCOUNTER — Encounter (HOSPITAL_COMMUNITY): Payer: Self-pay | Admitting: Internal Medicine

## 2019-11-27 DIAGNOSIS — K219 Gastro-esophageal reflux disease without esophagitis: Secondary | ICD-10-CM

## 2019-11-27 DIAGNOSIS — U071 COVID-19: Secondary | ICD-10-CM | POA: Diagnosis present

## 2019-11-27 DIAGNOSIS — F79 Unspecified intellectual disabilities: Secondary | ICD-10-CM

## 2019-11-27 DIAGNOSIS — I482 Chronic atrial fibrillation, unspecified: Secondary | ICD-10-CM

## 2019-11-27 DIAGNOSIS — E559 Vitamin D deficiency, unspecified: Secondary | ICD-10-CM

## 2019-11-27 DIAGNOSIS — Z7189 Other specified counseling: Secondary | ICD-10-CM

## 2019-11-27 DIAGNOSIS — Z515 Encounter for palliative care: Secondary | ICD-10-CM

## 2019-11-27 DIAGNOSIS — E785 Hyperlipidemia, unspecified: Secondary | ICD-10-CM

## 2019-11-27 DIAGNOSIS — I5030 Unspecified diastolic (congestive) heart failure: Secondary | ICD-10-CM

## 2019-11-27 DIAGNOSIS — D631 Anemia in chronic kidney disease: Secondary | ICD-10-CM

## 2019-11-27 DIAGNOSIS — Z9981 Dependence on supplemental oxygen: Secondary | ICD-10-CM

## 2019-11-27 DIAGNOSIS — I130 Hypertensive heart and chronic kidney disease with heart failure and stage 1 through stage 4 chronic kidney disease, or unspecified chronic kidney disease: Secondary | ICD-10-CM

## 2019-11-27 DIAGNOSIS — N183 Chronic kidney disease, stage 3 unspecified: Secondary | ICD-10-CM

## 2019-11-27 LAB — CBC WITH DIFFERENTIAL/PLATELET
Abs Immature Granulocytes: 0.19 10*3/uL — ABNORMAL HIGH (ref 0.00–0.07)
Basophils Absolute: 0 10*3/uL (ref 0.0–0.1)
Basophils Relative: 0 %
Eosinophils Absolute: 0 10*3/uL (ref 0.0–0.5)
Eosinophils Relative: 0 %
HCT: 25.4 % — ABNORMAL LOW (ref 36.0–46.0)
Hemoglobin: 7.8 g/dL — ABNORMAL LOW (ref 12.0–15.0)
Immature Granulocytes: 2 %
Lymphocytes Relative: 5 %
Lymphs Abs: 0.5 10*3/uL — ABNORMAL LOW (ref 0.7–4.0)
MCH: 33.3 pg (ref 26.0–34.0)
MCHC: 30.7 g/dL (ref 30.0–36.0)
MCV: 108.5 fL — ABNORMAL HIGH (ref 80.0–100.0)
Monocytes Absolute: 0.3 10*3/uL (ref 0.1–1.0)
Monocytes Relative: 3 %
Neutro Abs: 9.3 10*3/uL — ABNORMAL HIGH (ref 1.7–7.7)
Neutrophils Relative %: 90 %
Platelets: 129 10*3/uL — ABNORMAL LOW (ref 150–400)
RBC: 2.34 MIL/uL — ABNORMAL LOW (ref 3.87–5.11)
RDW: 18 % — ABNORMAL HIGH (ref 11.5–15.5)
WBC: 10.4 10*3/uL (ref 4.0–10.5)
nRBC: 1.3 % — ABNORMAL HIGH (ref 0.0–0.2)

## 2019-11-27 LAB — COMPREHENSIVE METABOLIC PANEL
ALT: 6 U/L (ref 0–44)
AST: 11 U/L — ABNORMAL LOW (ref 15–41)
Albumin: 2.3 g/dL — ABNORMAL LOW (ref 3.5–5.0)
Alkaline Phosphatase: 73 U/L (ref 38–126)
Anion gap: 12 (ref 5–15)
BUN: 64 mg/dL — ABNORMAL HIGH (ref 8–23)
CO2: 33 mmol/L — ABNORMAL HIGH (ref 22–32)
Calcium: 9.2 mg/dL (ref 8.9–10.3)
Chloride: 94 mmol/L — ABNORMAL LOW (ref 98–111)
Creatinine, Ser: 1.85 mg/dL — ABNORMAL HIGH (ref 0.44–1.00)
GFR calc Af Amer: 30 mL/min — ABNORMAL LOW (ref >60)
GFR calc non Af Amer: 26 mL/min — ABNORMAL LOW (ref >60)
Glucose, Bld: 111 mg/dL — ABNORMAL HIGH (ref 70–99)
Potassium: 5 mmol/L (ref 3.5–5.1)
Sodium: 139 mmol/L (ref 135–145)
Total Bilirubin: 1 mg/dL (ref 0.3–1.2)
Total Protein: 5.8 g/dL — ABNORMAL LOW (ref 6.5–8.1)

## 2019-11-27 LAB — MAGNESIUM: Magnesium: 1.8 mg/dL (ref 1.7–2.4)

## 2019-11-27 LAB — PROTIME-INR
INR: 3 — ABNORMAL HIGH (ref 0.8–1.2)
Prothrombin Time: 31.3 seconds — ABNORMAL HIGH (ref 11.4–15.2)

## 2019-11-27 LAB — PHOSPHORUS: Phosphorus: 3.4 mg/dL (ref 2.5–4.6)

## 2019-11-27 MED ORDER — CEFAZOLIN IV (FOR PTA / DISCHARGE USE ONLY): 2.0000 g | Freq: Two times a day (BID) | INTRAVENOUS | 0 refills | Status: AC

## 2019-11-27 MED ORDER — CEFAZOLIN SODIUM-DEXTROSE 2-4 GM/100ML-% IV SOLN
2.0000 g | Freq: Two times a day (BID) | INTRAVENOUS | Status: DC
  Administered 2019-11-27: 2 g via INTRAVENOUS
  Filled 2019-11-27: qty 100

## 2019-11-27 MED ORDER — THYROID 30 MG PO TABS
90.0000 mg | ORAL_TABLET | Freq: Every day | ORAL | Status: DC
  Filled 2019-11-27 (×2): qty 3

## 2019-11-27 MED ORDER — WARFARIN SODIUM 3 MG PO TABS: 1.5000 mg | ORAL_TABLET | ORAL | 0 refills | Status: DC

## 2019-11-27 MED ORDER — POTASSIUM CHLORIDE CRYS ER 20 MEQ PO TBCR: 20.0000 meq | EXTENDED_RELEASE_TABLET | ORAL | 3 refills | Status: DC

## 2019-11-27 MED ORDER — ACETAMINOPHEN 325 MG PO TABS: 650.0000 mg | ORAL_TABLET | Freq: Four times a day (QID) | ORAL | 1 refills | Status: AC | PRN

## 2019-11-27 NOTE — Plan of Care (Signed)
  Problem: Acute Rehab PT Goals(only PT should resolve) Goal: Pt Will Go Supine/Side To Sit Outcome: Progressing Flowsheets (Taken 11/27/2019 1227) Pt will go Supine/Side to Sit: with supervision Goal: Patient Will Perform Sitting Balance Outcome: Progressing Flowsheets (Taken 11/27/2019 1227) Patient will perform sitting balance:  with minimal assist  with moderate assist Goal: Patient Will Transfer Sit To/From Stand Outcome: Progressing Flowsheets (Taken 11/27/2019 1227) Patient will transfer sit to/from stand:  with minimal assist  with moderate assist Goal: Pt Will Ambulate Outcome: Progressing Flowsheets (Taken 11/27/2019 1227) Pt will Ambulate:  with rolling walker  with minimal assist  with moderate assist  15 feet   12:27 PM, 11/27/19 Lonell Grandchild, MPT Physical Therapist with [facility] 336 [phone] office [phone] mobile phone

## 2019-11-27 NOTE — Progress Notes (Signed)
PHARMACY CONSULT NOTE FOR:  OUTPATIENT  PARENTERAL ANTIBIOTIC THERAPY (OPAT)  Indication: MSSA Bacteremia Regimen: Cefazolin 2gm IV q12h End date: 12/06/2019  IV antibiotic discharge orders are pended. To discharging provider:  please sign these orders via discharge navigator,  Select New Orders & click on the button choice - Manage This Unsigned Work.     Thank you for allowing pharmacy to be a part of this patient's care.  Isac Sarna L 11/27/2019, 1:22 PM

## 2019-11-27 NOTE — Discharge Instructions (Signed)
1) Home health RN to infuse iV antibiotics/Ancef/cefazolin 2 g every 12 hours through PICC line Indication:  MSSA Bacteremia Last Day of Therapy:  12/06/2019 Labs - Once weekly: every Friday  CBC/D and BMP, Labs - Every other week:  Every Other Friday ESR and CRP  -Please verify with physician if okay to pull out pick  line at the end of therapy on 12/06/2019  2) metoprolol has been changed to Toprol-XL 200 mg daily to be taken along with Toprol-XL 37.5 mg daily for a total of 237.5 mg daily  3)Avoid ibuprofen/Advil/Aleve/Motrin/Goody Powders/Naproxen/BC powders/Meloxicam/Diclofenac/Indomethacin and other Nonsteroidal anti-inflammatory medications as these will make you more likely to bleed and can cause stomach ulcers, can also cause Kidney problems.   4) please take Protonix  daily as prescribed   5) Home health/outpt physical therapy as advised  6) Home Health  Oxygen via [place] at 3L/min   7)Outpatient Palliative care advised  8)Your Coumadin/Warfarin dose has been adjusted--- Take 1.5 mg on Monday Tuesday and Thursday and Friday only , do not take any Coumadin on  Wednesday  Saturday or Sunday -Check PT/INR every Monday and Fridays

## 2019-11-27 NOTE — Progress Notes (Signed)
Phone call in from Niece, [name]. Update given. [name] states hat there is no legal guardian that the patient makes her own decisions.Family including niece and sister along with patient discuss care options but it is the patients ultimate decision.  Notified MD and Palliative.  Elodia Florence RN  11/27/2019 @1225 

## 2019-11-27 NOTE — TOC Transition Note (Signed)
Transition of Care [facility]) - CM/SW Discharge Note   Patient Details  Name: [name] MRN: [id number] Date of Birth: 08-29-1941  Transition of Care [facility]) CM/SW Contact:  Shade Flood, LCSW Phone Number: 11/27/2019, 1:38 PM   Clinical Narrative:     Pt stable for dc per MD. Patient and family decline SNF rehab. They plan to continue to care for pt at home. [facility] has been following. Informed Linda at [place] of pt's dc. EMS transport form printed to the floor. Pt has Home O2 in place already. Outpatient Palliative also following at home. There are no other TOC needs identified for dc.   Expected Discharge Plan: [place] Barriers to Discharge: Barriers Resolved   Patient Goals and CMS Choice        Expected Discharge Plan and Services Expected Discharge Plan: [place] In-house Referral: Clinical Social Work   Post Acute Care Choice: Resumption of Svcs/PTA Provider Living arrangements for the past 2 months: Single Family Home Expected Discharge Date: 11/27/19                                    Prior Living Arrangements/Services Living arrangements for the past 2 months: Single Family Home Lives with:: Relatives   Do you feel safe going back to the place where you live?: Yes      Need for Family Participation in Patient Care: Yes (Comment) Care giver support system in place?: Yes (comment) Current home services: Home RN Criminal Activity/Legal Involvement Pertinent to Current Situation/Hospitalization: No - Comment as needed  Activities of Daily Living Home Assistive Devices/Equipment: Bedside commode/3-in-1 ADL Screening (condition at time of admission) Patient's cognitive ability adequate to safely complete daily activities?: No Is the patient deaf or have difficulty hearing?: Yes Does the patient have difficulty seeing, even when wearing glasses/contacts?: Yes Does the patient have difficulty concentrating,  remembering, or making decisions?: Yes Patient able to express need for assistance with ADLs?: Yes Does the patient have difficulty dressing or bathing?: Yes Independently performs ADLs?: No Communication: Independent Dressing (OT): Dependent Is this a change from baseline?: Pre-admission baseline Grooming: Dependent Is this a change from baseline?: Pre-admission baseline Feeding: Needs assistance Is this a change from baseline?: Pre-admission baseline Bathing: Dependent Is this a change from baseline?: Pre-admission baseline Toileting: Dependent Is this a change from baseline?: Pre-admission baseline In/Out Bed: Dependent Is this a change from baseline?: Pre-admission baseline Walks in Home: Dependent Is this a change from baseline?: Pre-admission baseline Does the patient have difficulty walking or climbing stairs?: Yes Weakness of Legs: Both Weakness of Arms/Hands: Both  Permission Sought/Granted                  Emotional Assessment       Orientation: : Oriented to Self, Oriented to Place Alcohol / Substance Use: Not Applicable Psych Involvement: No (comment)  Admission diagnosis:  Hypokalemia [E87.6] Coagulopathy ([place]) [D68.9] Elevated INR [R79.1] Hypoxia [R09.02] Anemia, unspecified type [D64.9] COVID-19 virus infection [U07.1] Patient Active Problem List   Diagnosis Date Noted  . COVID-19 virus infection   . Goals of care, counseling/discussion   . Palliative care by specialist   . Hypoxia 11/23/2019  . Macrocytic anemia 11/23/2019  . Bacteremia due to methicillin susceptible Staphylococcus aureus (MSSA) 11/09/2019  . Pneumonia due to COVID-19 virus 11/07/2019  . Peripheral neuropathy 09/12/2019  . Absolute anemia 02/14/2019  .  Barrett's esophagus without dysplasia 02/14/2019  . CHF, acute on chronic ([place]) 04/11/2018  . Atrial fibrillation with RVR ([place]) 04/11/2018  . Electrolyte depletion 09/18/2017  . Elevated alkaline phosphatase level 09/18/2017   . Acute renal failure superimposed on chronic kidney disease ([place])   . Elevated LFTs   . Hypomagnesemia   . Intellectual disability   . Hypokalemia 09/11/2017  . Hyponatremia 09/11/2017  . Acute renal injury ([place]) 09/11/2017  . Dehydration 09/11/2017  . Supratherapeutic INR 09/11/2017  . Hyperbilirubinemia 09/11/2017  . Elevated troponin 09/11/2017  . Vulvovaginitis due to yeast 09/11/2017  . Syncope 12/26/2015  . Chronic anticoagulation 12/20/2012  . Chronic kidney disease, stage 3 12/20/2012  . Anemia, normocytic normochromic 12/20/2012  . History of diagnostic tests 12/20/2012  . Acute on chronic diastolic CHF (congestive heart failure) ([place]) 11/25/2011  . Atrial fibrillation ([place])   . Degenerative joint disease   . GERD (gastroesophageal reflux disease)   . Gout   . Adenomatous polyps   . Unspecified glaucoma 06/25/2009   PCP:  Doree Albee, MD Pharmacy:   [place], [place] [address] [place] [address] Phone: [phone] Fax: [phone]  [place], [place] [address] [place] [address] Phone: [phone] Fax: [place] [address], [place] - [place] [address] [place] [address] [place] [address] Phone: [phone] Fax: [phone]     Social Determinants of Health (SDOH) Interventions    Readmission Risk Interventions No flowsheet data found.  Final next level of care: [place] Barriers to Discharge: Barriers Resolved   Patient Goals and CMS Choice        Discharge Placement                       Discharge Plan and Services In-house Referral: Clinical Social Work   Post Acute Care Choice: Resumption of Svcs/PTA Provider                               Social Determinants of Health (SDOH) Interventions     Readmission Risk Interventions No flowsheet data found.

## 2019-11-27 NOTE — Progress Notes (Signed)
ANTICOAGULATION CONSULT NOTE -  Pharmacy Consult for Coumadin Indication: atrial fibrillation  No Known Allergies  Patient Measurements: Height: 5\' 4"  (162.6 cm) Weight: 177 lb 11.1 oz (80.6 kg) IBW/kg (Calculated) : 54.7  Vital Signs: Temp: 97.4 F (36.3 C) (01/25 0512) Temp Source: Oral (01/25 0512) BP: 128/74 (01/25 0512) Pulse Rate: 100 (01/25 0512)  Labs: Recent Labs    11/25/19 0658 11/25/19 0658 11/26/19 0836 11/27/19 0513  HGB 8.8*   < > 7.8* 7.8*  HCT 28.4*  --  25.2* 25.4*  PLT 190  --  150 129*  LABPROT 43.0*  --  34.8* 31.3*  INR 4.5*  --  3.5* 3.0*  CREATININE 1.79*  --  1.83* 1.85*   < > = values in this interval not displayed.    Estimated Creatinine Clearance: 26.2 mL/min (A) (by C-G formula based on SCr of 1.85 mg/dL (H)).   Medical History: Past Medical History:  Diagnosis Date  . Adenomatous polyps    -resected via colonoscopy in 2011  . Anemia   . Anxiety    MR  . Atrial fibrillation ([place])   . CHF (congestive heart failure) ([place])   . Chronic kidney disease   . Degenerative joint disease    status post right total hip arthroplasty  . Dysrhythmia   . GERD (gastroesophageal reflux disease)    -Barrett's esophagus  . Glaucoma   . Glaucoma   . Gout   . Hyperlipidemia   . Hypertension   . Mental retardation   . Peripheral neuropathy 09/12/2019  . Pneumonia   . Premature ventricular contractions   . Restless leg syndrome   . Shortness of breath dyspnea   . Wears dentures   . Wears glasses     Medications:  See electronic med rec  Assessment: 79 y.o. F presents with decreased hemoglobin and dark stools. Pt on coumadin PTA for afib.  Home dose: 1.5mg  daily Mon-Fri then none on Sat and Sun  Vitamin K 1 mg IV given on 1/21  INR down to 3.5>> 3.0  ; since still on upper end of the therapeutic range, will hold 1 more day.  Hemoccult negative for bleeding.  Goal of Therapy:  INR 2-3 Monitor platelets by anticoagulation protocol:  Yes   Plan:  Hold warfarin today F/u daily INR F/u Hgb and s/s bleeding  Isac Sarna, BS [place], BCPS Clinical Pharmacist Pager [phone] 11/27/2019 8:52 AM

## 2019-11-27 NOTE — Discharge Summary (Signed)
[name], is a 79 y.o. female  DOB Oct 29, 1941  MRN [id number].  Admission date:  11/22/2019  Admitting Physician  Reubin Milan, MD  Discharge Date:  11/27/2019   Primary MD  Doree Albee, MD  Recommendations for primary care physician for things to follow:   1) Home health RN to infuse iV antibiotics/Ancef/cefazolin 2 g every 12 hours through PICC line Indication:  MSSA Bacteremia Last Day of Therapy:  12/06/2019 Labs - Once weekly: every Friday  CBC/D and BMP, Labs - Every other week:  Every Other Friday ESR and CRP  -Please verify with physician if okay to pull out pick  line at the end of therapy on 12/06/2019  2) metoprolol has been changed to Toprol-XL 200 mg daily to be taken along with Toprol-XL 37.5 mg daily for a total of 237.5 mg daily  3)Avoid ibuprofen/Advil/Aleve/Motrin/Goody Powders/Naproxen/BC powders/Meloxicam/Diclofenac/Indomethacin and other Nonsteroidal anti-inflammatory medications as these will make you more likely to bleed and can cause stomach ulcers, can also cause Kidney problems.   4) please take Protonix  daily as prescribed   5) Home health/outpt physical therapy as advised  6) Home Health  Oxygen via [place] at 3L/min   7)Outpatient Palliative care advised  8)Your Coumadin/Warfarin dose has been adjusted--- Take 1.5 mg on Monday Tuesday and Thursday and Friday only , do not take any Coumadin on  Wednesday  Saturday or Sunday -Check PT/INR every Monday and Friday  Admission Diagnosis  Hypokalemia [E87.6] Coagulopathy (HCC) [D68.9] Elevated INR [R79.1] Hypoxia [R09.02] Anemia, unspecified type [D64.9] COVID-19 virus infection [U07.1]  Discharge Diagnosis  Hypokalemia [E87.6] Coagulopathy ([place]) [D68.9] Elevated INR [R79.1] Hypoxia [R09.02] Anemia, unspecified type [D64.9] COVID-19 virus infection [U07.1]    Principal Problem:   Hypoxia Active  Problems:   COVID-19 virus infection   Unspecified glaucoma   GERD (gastroesophageal reflux disease)   Hypokalemia   Hyponatremia   Supratherapeutic INR   Hypomagnesemia   Atrial fibrillation with RVR (HCC)   Macrocytic anemia   Goals of care, counseling/discussion   Palliative care by specialist      Past Medical History:  Diagnosis Date  . Adenomatous polyps    -resected via colonoscopy in 2011  . Anemia   . Anxiety    MR  . Atrial fibrillation ([place])   . CHF (congestive heart failure) ([place])   . Chronic kidney disease   . Degenerative joint disease    status post right total hip arthroplasty  . Dysrhythmia   . GERD (gastroesophageal reflux disease)    -Barrett's esophagus  . Glaucoma   . Glaucoma   . Gout   . Hyperlipidemia   . Hypertension   . Mental retardation   . Peripheral neuropathy 09/12/2019  . Pneumonia   . Premature ventricular contractions   . Restless leg syndrome   . Shortness of breath dyspnea   . Wears dentures   . Wears glasses     Past Surgical History:  Procedure Laterality Date  . BIOPSY  02/22/2019  Procedure: BIOPSY;  Surgeon: Rogene Houston, MD;  [facility];  Service: Endoscopy;;  Esophageal biopies  . CATARACT EXTRACTION  2006   left eye  . COLONOSCOPY W/ POLYPECTOMY  2011  . COLONOSCOPY WITH PROPOFOL N/A 02/22/2019   Procedure: COLONOSCOPY WITH PROPOFOL;  Surgeon: Rogene Houston, MD;  [facility];  Service: Endoscopy;  Laterality: N/A;  . ESOPHAGOGASTRODUODENOSCOPY N/A 01/01/2016   Procedure: ESOPHAGOGASTRODUODENOSCOPY (EGD);  Surgeon: Rogene Houston, MD;  [facility];  Service: Endoscopy;  Laterality: N/A;  200  . ESOPHAGOGASTRODUODENOSCOPY (EGD) WITH PROPOFOL N/A 02/22/2019   Procedure: ESOPHAGOGASTRODUODENOSCOPY (EGD) WITH PROPOFOL;  Surgeon: Rogene Houston, MD;  [facility];  Service: Endoscopy;  Laterality: N/A;  . EYE SURGERY    . JOINT REPLACEMENT     x2  . MULTIPLE TOOTH  EXTRACTIONS    . RADIOLOGY WITH ANESTHESIA Right 11/21/2015   Procedure: MRI OF RIGHT KNEE WITHOUT CONTAST    (RADIOLOGY WITH ANESTHESIA);  Surgeon: Medication Radiologist, MD;  Location: [facility];  Service: Radiology;  Laterality: Right;  . RADIOLOGY WITH ANESTHESIA N/A 08/11/2018   Procedure: MRI of the BRAIN WITH ANESTHESIA WITHOUT CONTRAST;  Surgeon: Radiologist, Medication, MD;  Location: [place];  Service: Radiology;  Laterality: N/A;  . TONSILLECTOMY    . TOTAL HIP ARTHROPLASTY  2006   Right       HPI  from the history and physical done on the day of admission:    HPI: [name] is a 79 y.o. female with medical history significant of adenomatous polyps, anemia, anxiety, mental retardation, chronic atrial fibrillation, chronic diastolic CHF, chronic kidney disease, degenerative joint disease, Barrett's esophagus, glaucoma, gout, hyperlipidemia, hypertension, peripheral neuropathy, history of pneumonia, restless leg syndrome who was admitted from January 5 on till November 18, 2019 due to COVID-19 pneumonia.  Is brought to the emergency department due to hypoxia of 80% on room air.  The patient's O2 sat is currently in in the mid to high 90s on 4 LPM via [place].  She is unable to provide further history at this time.  ED Course: Initial vital signs temperature 98 F, pulse 97, respirations 25, blood pressure 119/95 mmHg and O2 sat 79% on room air.  Her O2 sat with 4 LPM via nasal cannula oxygen has been in the low to high 90s.  She received potassium replacement in the emergency department.    CBC showed a white count of 14.9, hemoglobin 8.0 g/dL and platelets 183.  PT was 57.2 and INR 6.5 SARS antigen was negative.  Fecal occult blood was negative.  Sodium 130, potassium 2.8, chloride 86 and CO2 32 mmol/L.BUN is 41 and creatinine 2.27 mg/dL.  Occult blood was negative.  SARS antigen was negative, but PCR was positive.  Her chest radiograph shows cardiomegaly with vascular congestion and mild  diffuse interstitial prominence likely due to pulmonary edema.  There is probably small pleural effusions.  There is increased right basilar airspace.may reflect atelectasis or pneumonia.  Please see image and full radiology report for further detail.   Hospital Course:   Brief Summary:- 79 y.o.femalewith medical history significant ofadenomatous polyps, anemia, anxiety, mental retardation, chronic atrial fibrillation, chronic diastolic CHF, chronic kidney disease, degenerative joint disease, Barrett's esophagus, glaucoma, gout, hyperlipidemia, hypertension, peripheral neuropathy, history of pneumonia, restless leg syndrome who was admitted from January 5 on till November 18, 2019 due to COVID-19 pneumonia and MSSA bacteremia, readmitted on 11/23/2019 due to worsening hypoxia --Please see  discharge instructions for details  A/p 1)HFrEF--history of combined diastolic systolic dysfunction CHF presenting with acute on chronic HF exacerbation --recent echo with EF of 35 to 40%, No ACE inhibitors, Entresto or ARB or aldactone at this moment given chronic renal failure and fluctuation of Cr.  Treated with Lasix , overall much improved -Toprol-XL  237.5 mg daily  2) acute on chronic hypoxic respiratory failure--- PTA patient was on 2.5 L of oxygen chronically, hypoxia initially worsened suspect due to a combination of recent COVID-19 pneumonia and HF exacerbation -Overall much improved  3) social/ethics--- at baseline patient has cognitive deficits since childhood, patient of Dr. Anastasio Champion, she is a DNR/DNI -plan of care as already discussed with patient's sister [name] and patient's niece Ms Eric Form)--- -Patient overall prognosis is very poor especially given very poor oral intake, palliative care input requested -Family may have to consider transition to hospice if she fails to improve  4) chronic atrial fibrillation-- CHA2DS2-VASc Scoreof at least 5, hold Coumadin due to  supratherapeutic INR--- patient received vitamin K on 11/23/2019, no bleeding noted -continue metoprolol 237.5 mg for rate control -INR is down to 3.5 from 7.3 -INR may remain elevated due to poor oral intake -Coumadin/Warfarin dose has been adjusted--- Take 1.5 mg on Monday Tuesday and Thursday and Friday only , do not take any Coumadin on  Wednesday  Saturday or Sunday -Check PT/INR every Monday and Friday -Please see discharge instructions for details  5)Hyponatremia/hypomagnesemia and hypokalemia--suspect due to dehydration and poor oral intake--- replace and recheck  6)Recent MSSA Bacteremia----continue iv Ancef through 12/06/2019 -Home health RN to infuse iV antibiotics/Ancef/cefazolin 2 g every 12 hours through PICC line Indication:  MSSA Bacteremia Last Day of Therapy:  12/06/2019 Labs - Once weekly: every Friday  CBC/D and BMP, Labs - Every other week:  Every Other Friday ESR and CRP --Please see discharge instructions for details  7) hypothyroidism----stable, continue [place] Thyroid 90 mg daily  8)CKD III--stable, creatinine currently 1.85 which is close to her recent baseline, renally adjust medications, avoid nephrotoxic agents / dehydration  / hypotension  9)physical deconditioning-- -Physical therapy recommended SNF during recent hospitalization -Patient and family at this time declines SNF rehab,  10) chronic anemia--- hemoglobin 7.8 which is close to recent baseline,   Disposition---- --patient and family continues to decline SNF rehab, discharge home with home health services -Overall prognosis is poor -  Code Status : DNR  Family Communication:     (patient is alert, awake  - Sister and niece-(Carolyn Atkins and patient's niece Ms Eric Form)-  Consults  : Palliative care consult    Discharge Condition: Overall prognosis remains poor  Follow UP----Please see discharge instructions for details   Diet and Activity recommendation:  As  advised  Discharge Instructions    Discharge Instructions    Amb Referral to Palliative Care   Complete by: As directed    Goals of care---   Call MD for:  difficulty breathing, headache or visual disturbances   Complete by: As directed    Call MD for:  persistant dizziness or light-headedness   Complete by: As directed    Call MD for:  persistant nausea and vomiting   Complete by: As directed    Call MD for:  severe uncontrolled pain   Complete by: As directed    Call MD for:  temperature >100.4   Complete by: As directed    Diet - low sodium heart healthy   Complete by: As directed  Discharge instructions   Complete by: As directed    1) Home health RN to infuse iV antibiotics/Ancef/cefazolin 2 g every 12 hours through PICC line Indication:  MSSA Bacteremia Last Day of Therapy:  12/06/2019 Labs - Once weekly: every Friday  CBC/D and BMP, Labs - Every other week:  Every Other Friday ESR and CRP  -Please verify with physician if okay to pull out pick  line at the end of therapy on 12/06/2019  2) metoprolol has been changed to Toprol-XL 200 mg daily to be taken along with Toprol-XL 37.5 mg daily for a total of 237.5 mg daily  3)Avoid ibuprofen/Advil/Aleve/Motrin/Goody Powders/Naproxen/BC powders/Meloxicam/Diclofenac/Indomethacin and other Nonsteroidal anti-inflammatory medications as these will make you more likely to bleed and can cause stomach ulcers, can also cause Kidney problems.   4) please take Protonix  daily as prescribed   5) Home health/outpt physical therapy as advised  6) Home Health  Oxygen via [place] at 3L/min   7)Outpatient Palliative care advised  8)Your Coumadin/Warfarin dose has been adjusted--- Take 1.5 mg on Monday Tuesday and Thursday and Friday only , do not take any Coumadin on  Wednesday  Saturday or Sunday -Check PT/INR every Monday and Friday   Home infusion instructions   Complete by: As directed    Instructions: Flushing of vascular access  device: 0.9% NaCl pre/post medication administration and prn patency; Heparin 100 u/ml, 22m for implanted ports and Heparin 10u/ml, 549mfor all other central venous catheters.   Increase activity slowly   Complete by: As directed        Discharge Medications     Allergies as of 11/27/2019   No Known Allergies     Medication List    TAKE these medications   acetaminophen 325 MG tablet Commonly known as: TYLENOL Take 2 tablets (650 mg total) by mouth every 6 (six) hours as needed for mild pain (or Fever >/= 101). What changed:   medication strength  how much to take  reasons to take this   allopurinol 100 MG tablet Commonly known as: ZYLOPRIM Take 100 mg by mouth at bedtime.   ascorbic acid 500 MG tablet Commonly known as: VITAMIN C Take 1 tablet (500 mg total) by mouth daily.   ceFAZolin  IVPB Commonly known as: ANCEF Inject 2 g into the vein every 12 (twelve) hours for 9 days. Indication:  MSSA Bacteremia Last Day of Therapy:  12/06/2019 Labs - Once weekly:  CBC/D and BMP, Labs - Every other week:  ESR and CRP What changed: additional instructions   ferrous gluconate 324 MG tablet Commonly known as: FERGON Take 1 tablet (324 mg total) by mouth 2 (two) times daily with a meal.   fluticasone 50 MCG/ACT nasal spray Commonly known as: FLONASE Place 1 spray into both nostrils daily.   gabapentin 300 MG capsule Commonly known as: NEURONTIN Take 300 mg at bedtime by mouth.   Ipratropium-Albuterol 20-100 MCG/ACT Aers respimat Commonly known as: COMBIVENT Inhale 1 puff into the lungs every 6 (six) hours as needed for wheezing or shortness of breath.   loratadine 10 MG tablet Commonly known as: CLARITIN Take 10 mg by mouth daily.   LORazepam 0.5 MG tablet Commonly known as: ATIVAN Take 1 tablet (0.5 mg total) by mouth 2 (two) times daily as needed for anxiety.   meclizine 12.5 MG tablet Commonly known as: ANTIVERT Take 12.5 mg by mouth 2 (two) times daily as  needed for dizziness.   metoprolol succinate 25 MG 24 hr  tablet Commonly known as: TOPROL-XL Take 1.5 tablets (37.5 mg total) by mouth See admin instructions. Take along with Toprol-XL 200 mg daily for a total of 237.5 mg daily   metoprolol 200 MG 24 hr tablet Commonly known as: Toprol XL Take 1 tablet (200 mg total) by mouth See admin instructions. Take along with Toprol-XL 37.5 mg daily for a total of 237.5 mg daily   neomycin-polymyxin-hydrocortisone OTIC solution Commonly known as: CORTISPORIN INSTILL 2-3 DROPS IN [facility] EAR TWICE DAILY What changed: See the new instructions.   NP Thyroid 90 MG tablet Generic drug: thyroid Take 1 tablet by mouth once daily What changed:   how much to take  when to take this   ondansetron 4 MG disintegrating tablet Commonly known as: ZOFRAN-ODT TAKE 1 TABLET BY MOUTH EVERY 4 TO 6 HOURS AS NEEDED What changed: See the new instructions.   pantoprazole 40 MG tablet Commonly known as: Protonix Take 1 tablet (40 mg total) by mouth 2 (two) times daily before a meal.   potassium chloride SA 20 MEQ tablet Commonly known as: KLOR-CON Take 1 tablet (20 mEq total) by mouth every Tuesday, Thursday, Saturday, and Sunday. Start taking on: November 28, 2019 What changed:   how much to take  when to take this   saccharomyces boulardii 250 MG capsule Commonly known as: FLORASTOR Take 1 capsule (250 mg total) by mouth 2 (two) times daily.   Simbrinza 1-0.2 % Susp Generic drug: Brinzolamide-Brimonidine Place 1 drop into both eyes 2 (two) times daily.   torsemide 20 MG tablet Commonly known as: DEMADEX Take 1 tablet (20 mg total) by mouth every Monday, Wednesday, and Friday.   Vitamin D3 125 MCG (5000 UT) Caps Take 5,000 Units by mouth daily.   warfarin 3 MG tablet Commonly known as: COUMADIN Take 0.5 tablets (1.5 mg total) by mouth See admin instructions. Take 1.5 mg on Monday Tuesday and Thursday and Friday only , do not take any Coumadin on   Wednesday  Saturday or Sunday What changed:   when to take this  additional instructions   Xalatan 0.005 % ophthalmic solution Generic drug: latanoprost Place 1 drop into both eyes at bedtime.            Home Infusion Instuctions  (From admission, onward)         Start     Ordered   11/27/19 0000  Home infusion instructions    Question:  Instructions  Answer:  Flushing of vascular access device: 0.9% NaCl pre/post medication administration and prn patency; Heparin 100 u/ml, 59m for implanted ports and Heparin 10u/ml, 535mfor all other central venous catheters.   11/27/19 1356         Major procedures and Radiology Reports - PLEASE review detailed and final reports for all details, in brief -    DG Chest Port 1 View  Result Date: 11/22/2019 CLINICAL DATA:  COVID, hypoxia EXAM: PORTABLE CHEST 1 VIEW COMPARISON:  11/13/2019, 11/07/2019, 03/20/2019 FINDINGS: Right upper extremity central venous catheter tip projects over the cavoatrial region. Large hiatal hernia. Suspected small pleural effusions. Increased right basilar airspace disease. Cardiomegaly with vascular congestion and mild diffuse interstitial prominence, favor pulmonary edema. No pneumothorax. IMPRESSION: 1. Cardiomegaly with vascular congestion and mild diffuse interstitial prominence, favor pulmonary edema. Probable small pleural effusions. 2. Increased right basilar airspace disease may reflect superimposed atelectasis or pneumonia 3. Large hiatal hernia Electronically Signed   By: KiDonavan Foil.D.   On: 11/22/2019 18:01  DG CHEST PORT 1 VIEW  Result Date: 11/13/2019 CLINICAL DATA:  79 year old female with positive COVID-19 status post central line placement. EXAM: PORTABLE CHEST 1 VIEW COMPARISON:  Chest radiograph dated 11/07/2019. FINDINGS: Right-sided PICC with tip in the region of the cavoatrial junction. There is cardiomegaly with mild vascular congestion. Diffuse bilateral interstitial prominence may  represent mild edema or pneumonia. Clinical correlation is recommended. A small left pleural effusion may be present. No pneumothorax. No acute osseous pathology. IMPRESSION: 1. Right-sided PICC with tip over the cavoatrial junction. 2. Cardiomegaly with vascular congestion. 3. Interstitial prominence may represent edema versus pneumonia. Clinical correlation is recommended. Electronically Signed   By: Anner Crete M.D.   On: 11/13/2019 19:52   DG Chest Port 1 View  Result Date: 11/07/2019 CLINICAL DATA:  Hypoxia.  COVID 19 positive EXAM: PORTABLE CHEST 1 VIEW COMPARISON:  03/20/2019 FINDINGS: Diffuse bilateral airspace disease most prominent in the left lung base. Probable bilateral pleural effusions. Very large hiatal hernia best seen on prior CT. Cardiac enlargement with vascular congestion. IMPRESSION: Diffuse bilateral airspace disease with small effusions. Left lower lobe consolidation. Findings most typical for heart failure with edema however given the COVID-19 status, pneumonia is quite possible. Electronically Signed   By: Franchot Gallo M.D.   On: 11/07/2019 15:18   DG ABD ACUTE 2+V W 1V CHEST  Result Date: 11/15/2019 CLINICAL DATA:  Abdomen pain EXAM: DG ABDOMEN ACUTE W/ 1V CHEST COMPARISON:  11/13/2019 chest x-ray, 11/07/2019 FINDINGS: Single view chest demonstrates cardiac enlargement. Large hiatal hernia. Aortic atherosclerosis. Patchy foci of airspace disease at the periphery of the lung bases with overall improved aeration. Supine and upright views of the abdomen demonstrate no free air beneath the diaphragm. Gas-filled loops of borderline to mildly distended central small bowel with relative absence of distal and colon bowel gas. Postsurgical changes of both hips IMPRESSION: 1. Small amount of airspace disease at the periphery of the right base with overall improved aeration since 11/13/2019. Enlarged cardiac silhouette. Large hiatal hernia 2. Air-filled borderline to mildly distended  central small bowel with relative absence of colon gas, mild or developing bowel obstruction is considered. Electronically Signed   By: Donavan Foil M.D.   On: 11/15/2019 18:56   ECHOCARDIOGRAM LIMITED  Result Date: 11/09/2019   ECHOCARDIOGRAM LIMITED REPORT   Patient Name:   [name] [facility] Date of Exam: 11/09/2019 Medical Rec #:  [id number]     Height:       68.5 in Accession #:    [id number]    Weight:       189.2 lb Date of Birth:  1941/06/06      BSA:          2.01 m Patient Age:    6 years      BP:           174/90 mmHg Patient Gender: F             HR:           106 bpm. Exam Location:  [facility]  Procedure: Limited Echo Indications:    Bacteremia  History:        Patient has prior history of Echocardiogram examinations, most                 recent 04/12/2018. Chronic Anticoagulation, COVID 19, GERD, Acute                 Rena l Injury.  Sonographer:    Leavy Cella  RDCS (AE) Referring Phys: [place]  1. Left ventricular ejection fraction, by visual estimation, is approximately 35 to 40%. The left ventricle has moderately decreased function. There is mildly increased left ventricular wall thickness. Images are limited and not all myocardial segments visualized.  2. The left ventricle demonstrates global hypokinesis.  3. Left ventricular diastolic parameters are indeterminate in the setting of atrial fibrillation.  4. Global right ventricle has mildly reduced systolic function.The right ventricular size is normal. no increase in right ventricular wall thickness.  5. Left atrial size was moderately dilated.  6. Presence of pericardial fat pad.  7. The mitral valve is grossly normal. Trivial mitral valve regurgitation.  8. The tricuspid valve was grossly normal. Tricuspid valve regurgitation is mild.  9. Tricuspid valve regurgitation is mild. 10. TR signal is inadequate for assessing pulmonary artery systolic pressure. 11. The inferior vena cava is normal in size with <50% respiratory  variability, suggesting right atrial pressure of 8 mmHg. 12. No obvious valvular vegetations. FINDINGS  Left Ventricle: Left ventricular ejection fraction, by visual estimation, is 35 to 40%. The left ventricle has moderately decreased function. The left ventricle demonstrates global hypokinesis. The left ventricular internal cavity size was the left ventricle is normal in size. There is mildly increased left ventricular wall thickness. Left ventricular diastolic parameters are indeterminate. Right Ventricle: The right ventricular size is normal. No increase in right ventricular wall thickness. Global RV systolic function is has mildly reduced systolic function. Left Atrium: Left atrial size was moderately dilated. Right Atrium: Right atrial size was normal in size. Right atrial pressure is estimated at 8 mmHg. Pericardium: There is no evidence of pericardial effusion is seen. There is no evidence of pericardial effusion. Presence of pericardial fat pad. Mitral Valve: The mitral valve is grossly normal. There is mild calcification of the mitral valve leaflet(s). MV Area by PHT, 5.54 cm. MV PHT, 39.73 msec. Trivial mitral valve regurgitation. Tricuspid Valve: The tricuspid valve is grossly normal. Tricuspid valve regurgitation is mild. Aortic Valve: The aortic valve is tricuspid. Aortic valve regurgitation is trivial. Mild aortic valve annular calcification. Pulmonic Valve: The pulmonic valve was not well visualized. Pulmonic valve regurgitation is not visualized by color flow Doppler. Pulmonic regurgitation is not visualized by color flow Doppler. Aorta: The aortic root is normal in size and structure. Venous: The inferior vena cava is normal in size with less than 50% respiratory variability, suggesting right atrial pressure of 8 mmHg. Shunts: No atrial level shunt detected by color flow Doppler.  LEFT VENTRICLE         Normals PLAX 2D LVIDd:         4.04 cm 3.6 cm   Diastology                 Normals LVIDs:          2.79 cm 1.7 cm   LV e' lateral:   9.57 cm/s 6.42 cm/s LV PW:         1.36 cm 1.4 cm   LV E/e' lateral: 8.7       15.4 LV IVS:        1.26 cm 1.3 cm   LV e' medial:    6.42 cm/s 6.96 cm/s LV SV:         42 ml   79 ml    LV E/e' medial:  12.9      6.96 LV SV Index:   20.62   45 ml/m2  LEFT ATRIUM  Index       RIGHT ATRIUM           Index LA diam:      3.80 cm 1.89 cm/m  RA Area:     19.50 cm LA Vol (A2C): 43.6 ml 21.73 ml/m RA Volume:   54.30 ml  27.06 ml/m LA Vol (A4C): 96.4 ml 48.04 ml/m   AORTA                 Normals Ao Root diam: 2.80 cm 31 mm MITRAL VALVE              Normals MV Area (PHT): 5.54 cm MV PHT:        39.73 msec 55 ms MV Decel Time: 137 msec   187 ms MV E velocity: 82.90 cm/s 103 cm/s MV A velocity: 36.40 cm/s 70.3 cm/s MV E/A ratio:  2.28       1.5  Rozann Lesches MD Electronically signed by Rozann Lesches MD Signature Date/Time: 11/09/2019/11:19:40 AMThe mitral valve is grossly normal.    Final    [place] EKG SITE RITE  Result Date: 11/13/2019 If Site Rite image not attached, placement could not be confirmed due to current cardiac rhythm.  Micro Results    Recent Results (from the past 240 hour(s))  Respiratory Panel by RT PCR (Flu A&B, Covid) - Nasopharyngeal Swab     Status: Abnormal   Collection Time: 11/23/19 12:27 AM   Specimen: Nasopharyngeal Swab  Result Value Ref Range Status   SARS Coronavirus 2 by RT PCR POSITIVE (A) NEGATIVE Final    Comment: RESULT CALLED TO, READ BACK BY AND VERIFIED WITH: T HAIRSTON,RN @03231 /21/21 MKELLY (NOTE) SARS-CoV-2 target nucleic acids are DETECTED. SARS-CoV-2 RNA is generally detectable in upper respiratory specimens  during the acute phase of infection. Positive results are indicative of the presence of the identified virus, but do not rule out bacterial infection or co-infection with other pathogens not detected by the test. Clinical correlation with patient history and other diagnostic information is necessary to  determine patient infection status. The expected result is Negative. Fact Sheet for Patients:  PinkCheek.be Fact Sheet for Healthcare Providers: GravelBags.it This test is not yet approved or cleared by the [place] FDA and  has been authorized for detection and/or diagnosis of SARS-CoV-2 by FDA under an Emergency Use Authorization (EUA).  This EUA will remain in effect (meaning this test can be used) for  the duration of  the COVID-19 declaration under Section 564(b)(1) of the Act, 21 U.S.C. section 360bbb-3(b)(1), unless the authorization is terminated or revoked sooner.    Influenza A by PCR NEGATIVE NEGATIVE Final   Influenza B by PCR NEGATIVE NEGATIVE Final    Comment: (NOTE) The Xpert Xpress SARS-CoV-2/FLU/RSV assay is intended as an aid in  the diagnosis of influenza from Nasopharyngeal swab specimens and  should not be used as a sole basis for treatment. Nasal washings and  aspirates are unacceptable for Xpert Xpress SARS-CoV-2/FLU/RSV  testing. Fact Sheet for Patients: PinkCheek.be Fact Sheet for Healthcare Providers: GravelBags.it This test is not yet approved or cleared by the [place] FDA and  has been authorized for detection and/or diagnosis of SARS-CoV-2 by  FDA under an Emergency Use Authorization (EUA). This EUA will remain  in effect (meaning this test can be used) for the duration of the  Covid-19 declaration under Section 564(b)(1) of the Act, 21  U.S.C. section 360bbb-3(b)(1), unless the authorization is  terminated or revoked. Performed at [facility], [address] [place], [place] [address]   Culture, blood (Routine X 2) w Reflex to ID Panel     Status: None (Preliminary result)   Collection Time: 11/23/19 12:38 AM   Specimen: BLOOD  Result Value Ref Range Status   Specimen Description BLOOD PICC LINE  Final   Special Requests    Final    BOTTLES DRAWN AEROBIC AND ANAEROBIC Blood Culture adequate volume   Culture   Final    NO GROWTH 4 DAYS Performed at [facility], [address], [place], [place] [address]    Report Status PENDING  Incomplete  Culture, blood (Routine X 2) w Reflex to ID Panel     Status: None (Preliminary result)   Collection Time: 11/23/19 12:45 AM   Specimen: BLOOD  Result Value Ref Range Status   Specimen Description BLOOD RIGHT ANTECUBITAL  Final   Special Requests   Final    BOTTLES DRAWN AEROBIC AND ANAEROBIC Blood Culture adequate volume   Culture   Final    NO GROWTH 4 DAYS Performed at [facility], [address] [place], [place] [address]    Report Status PENDING  Incomplete       Today   Subjective    [name] today has no new concerns - -Please see discharge instructions for details        No fever  Or chills  No Nausea, Vomiting or Diarrhea  Patient has been seen and examined prior to discharge   Objective   Blood pressure 120/60, pulse 72, temperature (!) 97.4 F (36.3 C), temperature source Oral, resp. rate 20, height 5' 4"  (1.626 m), weight 80.6 kg, SpO2 96 %.   Intake/Output Summary (Last 24 hours) at 11/27/2019 1359 Last data filed at 11/27/2019 0900 Gross per 24 hour  Intake 270 ml  Output 800 ml  Net -530 ml    Exam Gen:- Awake Alert, no acute distress  HEENT:- [place].AT, No sclera icterus Nose-  3L/min Neck-Supple Neck,No JVD,.  Lungs-improved air movement, no wheezing CV- S1, S2 normal, regular Abd-  +ve B.Sounds, Abd Soft, No tenderness,    Extremity/Skin:- No  edema,   good pulses Psych--baseline cognitive deficits, cooperative Neuro-generalized weakness no new focal deficits, no tremors    Data Review   CBC w Diff:  Lab Results  Component Value Date   WBC 10.4 11/27/2019   HGB 7.8 (L) 11/27/2019   HCT 25.4 (L) 11/27/2019   PLT 129 (L) 11/27/2019   LYMPHOPCT 5 11/27/2019   MONOPCT 3 11/27/2019   EOSPCT 0 11/27/2019   BASOPCT 0  11/27/2019    CMP:  Lab Results  Component Value Date   NA 139 11/27/2019   K 5.0 11/27/2019   CL 94 (L) 11/27/2019   CO2 33 (H) 11/27/2019   BUN 64 (H) 11/27/2019   CREATININE 1.85 (H) 11/27/2019   CREATININE 1.44 (H) 07/06/2019   PROT 5.8 (L) 11/27/2019   ALBUMIN 2.3 (L) 11/27/2019   BILITOT 1.0 11/27/2019   ALKPHOS 73 11/27/2019   AST 11 (L) 11/27/2019   ALT 6 11/27/2019  .   Total Discharge time is about 33 minutes  Roxan Hockey M.D on 11/27/2019 at 1:59 PM  Go to [email or web address] -  for contact info  [facility] - Office  [phone]

## 2019-11-27 NOTE — Consult Note (Signed)
Consultation Note Date: 11/27/2019   Patient Name: [name]  DOB: [birth year]  MRN: [id number]  Age / Sex: 79 y.o., female  PCP: Doree Albee, MD Referring Physician: Roxan Hockey, MD  Reason for Consultation: Establishing goals of care and Psychosocial/spiritual support  HPI/Patient Profile: 79 y.o. female  with past medical history of adenomatous polyps, anemia, anxiety, MR, chronic a fib, chronic dCHF, CKD, DJD, Barrett's esophagus, glaucoma, gout, HTN/HLD, peripheral neuropathy, history of pneumonia, restless leg syndrome  admitted on 11/22/2019 with heart failure, acute on chronic hypoxic respiratory failure, recent bacteremia continue IV antibiotics through February 3.   Clinical Assessment and Goals of Care: Conference with attending and bedside nursing staff related to patient condition, needs.  Call to PCP for conference with staff related to family/home situation, needs.   I have reviewed medical records including EPIC notes, labs and imaging, received report from attending and bedside nursing staff, spoke on the phone with sister, [name], to discuss diagnosis prognosis, [place], EOL wishes, disposition and options.  I introduced Palliative Medicine as specialized medical care for people living with serious illness. It focuses on providing relief from the symptoms and stress of a serious illness.  Ms. Boehm is active with outpatient palliative services, they have been attempting to reach family for start of visit.  We discussed a brief life review of the patient.  [name] shares that she moved in a 2 bedroom trailer behind her home where her mother and Genesi lived until their mother died in 45.  [name] shares that Caleigh still lives in this trailer behind her home, it takes her a 2-minute walk to reach [place].  Niece, Eric Form is present during the day, Sister [name] stays overnight.    Tammra and Carolyn's brother who sometimes helped with care died in 2023-11-21.  [name] states that she has extended family members who help as needed.    I encouraged [name] that they have taken good care of her, as she has lived to age 14.  [name] shares that family was told that she wouldn't live to be a teenager.   As far as functional and nutritional status, [name] shares that Kye is a "picky eater".  She shares that she will give her half of an Ensure stating that it is "medicine for her stomach".  She is incontinent x2 at baseline, but can help participate in ADLs.  Chrys Racer tells me that she has returned to work part-time, trying to build her stamina, but tells me that family is ready to have [place] home, they feel they can care for her.  We discussed her current illness and what it means in the larger context of her on-going co-morbidities.  Natural disease trajectory and expectations at EOL were discussed.  I encourage family to think about hospice care sooner rather than later.  We talked about bacteremia.  Chrys Racer states that [name] is able to provide IV antibiotic through PICC line.  I share my concern that bacteremia can sometimes seed in hidden places and recur.  The difference between aggressive medical intervention and comfort care was considered in light of the patient's goals of care.  We talked about hospice sooner rather than later.  Johniece is active with [facility] services.  Questions and concerns were addressed.  The family was encouraged to call with questions or concerns.   PLEASE review dc meds with [name].   Return home with outpatient PT, services already in place.   HCPOA   NEXT OF KIN -  Per sister, [name],  Ms. Mccutcheon makes her own healthcare decisions with help and input from family.      SUMMARY OF RECOMMENDATIONS   Continue to treat the treatable but no extraordinary measures Outpatient palliative to follow At this point continue to  [facility] as needed  Code Status/Advance Care Planning:  DNR  Symptom Management:   Per hospitalist, no additional needs at this time.    Palliative Prophylaxis:   no special needs at this time.   Additional Recommendations (Limitations, Scope, Preferences):  treat the treatable, no CPR or intubation. Out patient palliative.   Psycho-social/Spiritual:   Desire for further Chaplaincy support:no  Additional Recommendations: Caregiving  Support/Resources and Education on Hospice  Prognosis:   Unable to determine, based on outcomes.  6 months or less would not be surprising based on chronic illness burden.   Discharge Planning: Home with outpatient palliative to follow, at home rehab      Primary Diagnoses: Present on Admission: . Hypoxia . Supratherapeutic INR . Unspecified glaucoma . Hypomagnesemia . Hyponatremia . Hypokalemia . GERD (gastroesophageal reflux disease) . Atrial fibrillation with RVR ([place]) . Macrocytic anemia   I have reviewed the medical record, interviewed the patient and family, and examined the patient. The following aspects are pertinent.  Past Medical History:  Diagnosis Date  . Adenomatous polyps    -resected via colonoscopy in 2011  . Anemia   . Anxiety    MR  . Atrial fibrillation ([place])   . CHF (congestive heart failure) ([place])   . Chronic kidney disease   . Degenerative joint disease    status post right total hip arthroplasty  . Dysrhythmia   . GERD (gastroesophageal reflux disease)    -Barrett's esophagus  . Glaucoma   . Glaucoma   . Gout   . Hyperlipidemia   . Hypertension   . Mental retardation   . Peripheral neuropathy 09/12/2019  . Pneumonia   . Premature ventricular contractions   . Restless leg syndrome   . Shortness of breath dyspnea   . Wears dentures   . Wears glasses    Social History   Socioeconomic History  . Marital status: Single    Spouse name: Not on file  . Number of children: Not on file  .  Years of education: Not on file  . Highest education level: Not on file  Occupational History  . Occupation: disabled  Tobacco Use  . Smoking status: Never Smoker  . Smokeless tobacco: Never Used  Substance and Sexual Activity  . Alcohol use: No  . Drug use: No  . Sexual activity: Not Currently  Other Topics Concern  . Not on file  Social History Narrative   Single,lives by herself.A family member (niece) now with her 8am-2pm,other family members rest of day.   Social Determinants of Health   Financial Resource Strain:   . Difficulty of Paying Living Expenses: Not on file  Food Insecurity:   . Worried About Charity fundraiser in the Last Year:  Not on file  . Ran Out of Food in the Last Year: Not on file  Transportation Needs:   . Lack of Transportation (Medical): Not on file  . Lack of Transportation (Non-Medical): Not on file  Physical Activity:   . Days of Exercise per Week: Not on file  . Minutes of Exercise per Session: Not on file  Stress:   . Feeling of Stress : Not on file  Social Connections:   . Frequency of Communication with Friends and Family: Not on file  . Frequency of Social Gatherings with Friends and Family: Not on file  . Attends Religious Services: Not on file  . Active Member of Clubs or Organizations: Not on file  . Attends Archivist Meetings: Not on file  . Marital Status: Not on file   Family History  Problem Relation Age of Onset  . Hypertension Mother    Scheduled Meds: . ascorbic acid  500 mg Oral Daily  . Chlorhexidine Gluconate Cloth  6 each Topical Daily  . cholecalciferol  5,000 Units Oral Daily  . feeding supplement (ENSURE ENLIVE)  237 mL Oral BID BM  . ferrous gluconate  324 mg Oral BID WC  . fluticasone  1 spray Each Nare Daily  . furosemide  40 mg Intravenous Q12H  . gabapentin  300 mg Oral QHS  . latanoprost  1 drop Both Eyes QHS  . loratadine  10 mg Oral Daily  . metoprolol  200 mg Oral Daily  . metoprolol  succinate  37.5 mg Oral Daily  . multivitamin with minerals  1 tablet Oral Daily  . pantoprazole  40 mg Oral BID AC  . potassium chloride SA  40 mEq Oral Daily  . saccharomyces boulardii  250 mg Oral BID  . [START ON 11/28/2019] thyroid  90 mg Oral QAC breakfast  . Warfarin - Pharmacist Dosing Inpatient   Does not apply q1800   Continuous Infusions: . ceFAZolin 2 g (11/27/19 1122)   PRN Meds:.acetaminophen **OR** acetaminophen, ipratropium-albuterol, LORazepam, meclizine, neomycin-polymyxin-hydrocortisone, ondansetron Medications Prior to Admission:  Prior to Admission medications   Medication Sig Start Date End Date Taking? Authorizing Provider  acetaminophen (TYLENOL) 500 MG tablet Take 500-1,000 mg by mouth every 6 (six) hours as needed for moderate pain.    Yes [provider]  allopurinol (ZYLOPRIM) 100 MG tablet Take 100 mg by mouth at bedtime.    Yes [provider]  ascorbic acid (VITAMIN C) 500 MG tablet Take 1 tablet (500 mg total) by mouth daily. 11/19/19  Yes Emokpae, Courage, MD  Brinzolamide-Brimonidine (SIMBRINZA) 1-0.2 % SUSP Place 1 drop into both eyes 2 (two) times daily.    Yes [provider]  ceFAZolin (ANCEF) IVPB Inject 2 g into the vein every 12 (twelve) hours for 19 days. Indication:  MSSA Bacteremia Last Day of Therapy:  12/06/2019 Labs - Once weekly: every Tuesday  CBC/D and BMP, Labs - Every other week:  Every Other Tuesday ESR and CRP 11/18/19 12/07/19 Yes Emokpae, Courage, MD  Cholecalciferol (VITAMIN D3) 5000 units CAPS Take 5,000 Units by mouth daily.    Yes [provider]  ferrous gluconate (FERGON) 324 MG tablet Take 1 tablet (324 mg total) by mouth 2 (two) times daily with a meal. 11/18/19  Yes Emokpae, Courage, MD  fluticasone (FLONASE) 50 MCG/ACT nasal spray Place 1 spray into both nostrils daily.   Yes [provider]  gabapentin (NEURONTIN) 300 MG capsule Take 300 mg at bedtime by mouth. 08/23/17  Yes [provider]  Ipratropium-Albuterol (COMBIVENT) 20-100 MCG/ACT AERS respimat Inhale 1 puff into the lungs every 6 (six) hours as needed for wheezing or shortness of breath. 11/21/19  Yes Ailene Ards, NP  latanoprost (XALATAN) 0.005 % ophthalmic solution Place 1 drop into both eyes at bedtime.     Yes [provider]  loratadine (CLARITIN) 10 MG tablet Take 10 mg by mouth daily.    Yes [provider]  LORazepam (ATIVAN) 0.5 MG tablet Take 1 tablet (0.5 mg total) by mouth 2 (two) times daily as needed for anxiety. 11/18/19  Yes Roxan Hockey, MD  meclizine (ANTIVERT) 12.5 MG tablet Take 12.5 mg by mouth 2 (two) times daily as needed for dizziness.    Yes [provider]  metoprolol (TOPROL XL) 200 MG 24 hr tablet Take 1 tablet (200 mg total) by mouth See admin instructions. Take along with Toprol-XL 37.5 mg daily for a total of 237.5 mg daily 11/18/19 11/17/20 Yes Emokpae, Courage, MD  metoprolol succinate (TOPROL-XL) 25 MG 24 hr tablet Take 1.5 tablets (37.5 mg total) by mouth See admin instructions. Take along with Toprol-XL 200 mg daily for a total of 237.5 mg daily 11/18/19  Yes Emokpae, Courage, MD  neomycin-polymyxin-hydrocortisone (CORTISPORIN) OTIC solution INSTILL 2-3 DROPS IN [facility] EAR TWICE DAILY Patient taking differently: Place 2-3 drops into both ears 2 (two) times daily as needed (for ear pain).  09/21/19  Yes Doree Albee, MD  NP THYROID 90 MG tablet Take 1 tablet by mouth once daily Patient taking differently: Take 90 mg by mouth every morning.  09/07/19  Yes Gosrani, Nimish C, MD  ondansetron (ZOFRAN-ODT) 4 MG disintegrating tablet TAKE 1 TABLET BY MOUTH EVERY 4 TO 6 HOURS AS NEEDED Patient taking differently: Take 4 mg by mouth every 4 (four) hours as needed for nausea or vomiting.  10/09/19  Yes Gosrani, Nimish C, MD  pantoprazole (PROTONIX) 40 MG tablet Take 1 tablet (40 mg total) by mouth 2 (two) times daily before a meal. 11/18/19 11/17/20 Yes Emokpae,  Courage, MD  potassium chloride SA (KLOR-CON) 20 MEQ tablet Take 2 tablets (40 mEq total) by mouth daily. 11/22/19  Yes Ailene Ards, NP  saccharomyces boulardii (FLORASTOR) 250 MG capsule Take 1 capsule (250 mg total) by mouth 2 (two) times daily. 11/18/19  Yes Roxan Hockey, MD  torsemide (DEMADEX) 20 MG tablet Take 1 tablet (20 mg total) by mouth every Monday, Wednesday, and Friday. 11/20/19  Yes Roxan Hockey, MD  warfarin (COUMADIN) 3 MG tablet Take 0.5 tablets (1.5 mg total) by mouth every Monday, Tuesday, Wednesday, Thursday, and Friday. Does not take on Saturday/Sunday 11/20/19  Yes Emokpae, Courage, MD  ondansetron (ZOFRAN-ODT) 4 MG disintegrating tablet Take 4 mg by mouth every 4 (four) hours as needed for nausea or vomiting.  03/17/18   [provider]   No Known Allergies Review of Systems  Unable to perform ROS: Other    Physical Exam Vitals and nursing note reviewed.     Vital Signs: BP 120/60   Pulse 72   Temp (!) 97.4 F (36.3 C) (Oral)   Resp 20   Ht 5' 4"  (1.626 m)   Wt 80.6 kg   SpO2 96%   BMI 30.50 kg/m  Pain Scale: 0-10   Pain Score: 0-No pain   SpO2: SpO2: 96 % O2 Device:SpO2: 96 % O2 Flow Rate: .O2 Flow Rate (L/min): 4 L/min  IO: Intake/output summary:   Intake/Output Summary (Last 24  hours) at 11/27/2019 1249 Last data filed at 11/27/2019 0900 Gross per 24 hour  Intake 270 ml  Output 800 ml  Net -530 ml    LBM: Last BM Date: 11/26/19 Baseline Weight: Weight: 84.8 kg Most recent weight: Weight: 80.6 kg     Palliative Assessment/Data:   Flowsheet Rows     Most Recent Value  Intake Tab  Referral Department  Hospitalist  Unit at Time of Referral  Cardiac/Telemetry Unit  Palliative Care Primary Diagnosis  Pulmonary  Date Notified  11/24/19  Palliative Care Type  New Palliative care  Reason for referral  Clarify Goals of Care  Date of Admission  11/22/19  Date first seen by Palliative Care  11/27/19  # of days Palliative  referral response time  3 Day(s)  # of days IP prior to Palliative referral  2  Clinical Assessment  Palliative Performance Scale Score  40%  Pain Max last 24 hours  Not able to report  Pain Min Last 24 hours  Not able to report  Dyspnea Max Last 24 Hours  Not able to report  Dyspnea Min Last 24 hours  Not able to report  Psychosocial & Spiritual Assessment  Palliative Care Outcomes      Time In: 0910 Time Out: 1020 Time Total: 70 minutes  Greater than 50%  of this time was spent counseling and coordinating care related to the above assessment and plan.  Signed by: Drue Novel, NP   Please contact Palliative Medicine Team phone at [phone] for questions and concerns.  For individual provider: See Shea Evans

## 2019-11-27 NOTE — Evaluation (Addendum)
Physical Therapy Evaluation Patient Details Name: [name] MRN: [id number] DOB: 1941/06/03 Today's Date: 11/27/2019   History of Present Illness  [name] is a 79 y.o. female with medical history significant of adenomatous polyps, anemia, anxiety, mental retardation, chronic atrial fibrillation, chronic diastolic CHF, chronic kidney disease, degenerative joint disease, Barrett's esophagus, glaucoma, gout, hyperlipidemia, hypertension, peripheral neuropathy, history of pneumonia, restless leg syndrome who was admitted from January 5 on till November 18, 2019 due to COVID-19 pneumonia.  Is brought to the emergency department due to hypoxia of 80% on room air.  The patient's O2 sat is currently in in the mid to high 90s on 4 LPM via [place].  She is unable to provide further history at this time.    Clinical Impression  Patient demonstrates slow labored movement for sitting up at bedside, frequently leaning forward while seated due to fatigue, unable to transfer using RW due to weakness, required Max assist stand pivot to transfer to chair.  Patient tolerated sitting up in chair after therapy - RN aware.  Patient will benefit from continued physical therapy in hospital and recommended venue below to increase strength, balance, endurance for safe ADLs and gait.    Follow Up Recommendations SNF;Supervision for mobility/OOB;Supervision/Assistance - 24 hour    Equipment Recommendations  None recommended by PT    Recommendations for Other Services       Precautions / Restrictions Precautions Precautions: Fall Restrictions Weight Bearing Restrictions: No      Mobility  Bed Mobility Overal bed mobility: Needs Assistance Bed Mobility: Supine to Sit     Supine to sit: Min assist     General bed mobility comments: increased time, labored movement  Transfers Overall transfer level: Needs assistance Equipment used: 1 person hand held assist;Rolling walker (2 wheeled) Transfers: Sit  to/from [facility] Sit to Stand: Max assist;Mod assist Stand pivot transfers: Mod assist;Max assist       General transfer comment: unable to use RW for sit to stands due to weakness/poor carryover  Ambulation/Gait Ambulation/Gait assistance: Max assist Gait Distance (Feet): 2 Feet Assistive device: 1 person hand held assist Gait Pattern/deviations: Decreased step length - right;Decreased step length - left;Decreased stride length Gait velocity: decreased   General Gait Details: limited to 2-3 slow unsteady side steps with hand held assist  Stairs            Wheelchair Mobility    Modified Rankin (Stroke Patients Only)       Balance Overall balance assessment: Needs assistance Sitting-balance support: Feet supported;No upper extremity supported Sitting balance-Leahy Scale: Fair Sitting balance - Comments: seated at EOB   Standing balance support: During functional activity;No upper extremity supported Standing balance-Leahy Scale: Poor Standing balance comment: hand held assist                             Pertinent Vitals/Pain Pain Assessment: No/denies pain    Home Living Family/patient expects to be discharged to:: Private residence Living Arrangements: Other (Comment);Alone Available Help at Discharge: Family;Personal care attendant;Available 24 hours/day Type of Home: Mobile home Home Access: Ramped entrance     Home Layout: One level Home Equipment: [place] - 2 wheels;Bedside commode;Wheelchair - manual;Shower seat;Hospital bed      Prior Function Level of Independence: Needs assistance   Gait / Transfers Assistance Needed: household ambulator with RW  ADL's / Homemaking Assistance Needed: assisted by family        Hand  Dominance   Dominant Hand: Right    Extremity/Trunk Assessment   Upper Extremity Assessment Upper Extremity Assessment: Generalized weakness    Lower Extremity Assessment Lower Extremity  Assessment: Generalized weakness    Cervical / Trunk Assessment Cervical / Trunk Assessment: Kyphotic  Communication   Communication: No difficulties  Cognition   Behavior During Therapy: WFL for tasks assessed/performed;Anxious Overall Cognitive Status: History of cognitive impairments - at baseline                                        General Comments      Exercises     Assessment/Plan    PT Assessment Patient needs continued PT services  PT Problem List Decreased strength;Decreased activity tolerance;Decreased balance;Decreased mobility       PT Treatment Interventions Gait training;Functional mobility training;Therapeutic activities;Therapeutic exercise;Patient/family education    PT Goals (Current goals can be found in the Care Plan section)  Acute Rehab PT Goals Patient Stated Goal: return home with family to assist PT Goal Formulation: With patient Time For Goal Achievement: 12/11/19 Potential to Achieve Goals: Good    Frequency Min 3X/week   Barriers to discharge        Co-evaluation               AM-PAC PT "6 Clicks" Mobility  Outcome Measure Help needed turning from your back to your side while in a flat bed without using bedrails?: A Little Help needed moving from lying on your back to sitting on the side of a flat bed without using bedrails?: A Little Help needed moving to and from a bed to a chair (including a wheelchair)?: A Lot Help needed standing up from a chair using your arms (e.g., wheelchair or bedside chair)?: A Lot Help needed to walk in hospital room?: A Lot Help needed climbing 3-5 steps with a railing? : Total 6 Click Score: 13    End of Session Equipment Utilized During Treatment: Oxygen Activity Tolerance: Patient tolerated treatment well;Patient limited by fatigue Patient left: in chair;with call bell/phone within reach;with chair alarm set Nurse Communication: Mobility status PT Visit Diagnosis:  Unsteadiness on feet (R26.81);Other abnormalities of gait and mobility (R26.89);Muscle weakness (generalized) (M62.81)    Time: 1132-1203 PT Time Calculation (min) (ACUTE ONLY): 31 min   Charges:   PT Evaluation $PT Eval Moderate Complexity: 1 Mod PT Treatments $Therapeutic Activity: 23-37 mins        12:30 PM, 11/27/19 Lonell Grandchild, MPT Physical Therapist with [facility] 336 [phone] office [phone] mobile phone

## 2019-11-28 ENCOUNTER — Telehealth (INDEPENDENT_AMBULATORY_CARE_PROVIDER_SITE_OTHER): Payer: Self-pay

## 2019-11-28 ENCOUNTER — Other Ambulatory Visit: Payer: Self-pay

## 2019-11-28 ENCOUNTER — Other Ambulatory Visit: Payer: Medicare Other

## 2019-11-28 ENCOUNTER — Telehealth: Payer: Self-pay

## 2019-11-28 DIAGNOSIS — M1990 Unspecified osteoarthritis, unspecified site: Secondary | ICD-10-CM

## 2019-11-28 DIAGNOSIS — A4101 Sepsis due to Methicillin susceptible Staphylococcus aureus: Secondary | ICD-10-CM | POA: Diagnosis not present

## 2019-11-28 DIAGNOSIS — Z515 Encounter for palliative care: Secondary | ICD-10-CM

## 2019-11-28 DIAGNOSIS — U071 COVID-19: Secondary | ICD-10-CM | POA: Diagnosis not present

## 2019-11-28 DIAGNOSIS — I5032 Chronic diastolic (congestive) heart failure: Secondary | ICD-10-CM | POA: Diagnosis not present

## 2019-11-28 DIAGNOSIS — Z452 Encounter for adjustment and management of vascular access device: Secondary | ICD-10-CM | POA: Diagnosis not present

## 2019-11-28 DIAGNOSIS — G629 Polyneuropathy, unspecified: Secondary | ICD-10-CM

## 2019-11-28 DIAGNOSIS — J1282 Pneumonia due to coronavirus disease 2019: Secondary | ICD-10-CM | POA: Diagnosis not present

## 2019-11-28 DIAGNOSIS — N183 Chronic kidney disease, stage 3 unspecified: Secondary | ICD-10-CM | POA: Diagnosis not present

## 2019-11-28 DIAGNOSIS — L89612 Pressure ulcer of right heel, stage 2: Secondary | ICD-10-CM | POA: Diagnosis not present

## 2019-11-28 DIAGNOSIS — I2510 Atherosclerotic heart disease of native coronary artery without angina pectoris: Secondary | ICD-10-CM | POA: Diagnosis not present

## 2019-11-28 DIAGNOSIS — M109 Gout, unspecified: Secondary | ICD-10-CM

## 2019-11-28 DIAGNOSIS — R6520 Severe sepsis without septic shock: Secondary | ICD-10-CM | POA: Diagnosis not present

## 2019-11-28 DIAGNOSIS — I5021 Acute systolic (congestive) heart failure: Secondary | ICD-10-CM | POA: Diagnosis not present

## 2019-11-28 DIAGNOSIS — J9611 Chronic respiratory failure with hypoxia: Secondary | ICD-10-CM

## 2019-11-28 DIAGNOSIS — D631 Anemia in chronic kidney disease: Secondary | ICD-10-CM | POA: Diagnosis not present

## 2019-11-28 DIAGNOSIS — A4189 Other specified sepsis: Secondary | ICD-10-CM | POA: Diagnosis not present

## 2019-11-28 DIAGNOSIS — I4819 Other persistent atrial fibrillation: Secondary | ICD-10-CM

## 2019-11-28 LAB — CULTURE, BLOOD (ROUTINE X 2)
Culture: NO GROWTH
Culture: NO GROWTH
Special Requests: ADEQUATE
Special Requests: ADEQUATE

## 2019-11-28 NOTE — Progress Notes (Signed)
COMMUNITY PALLIATIVE CARE SW NOTE  PATIENT NAME: [name] DOB: 1941/07/27 MRN: [id number]  PRIMARY CARE PROVIDER: Doree Albee, MD  RESPONSIBLE PARTY:  Acct ID - Guarantor Home Phone Work Phone Relationship Acct Type  [id number] Silvestre [phone]  Self P/F     [address], [place], [place] [address]     PLAN OF CARE and INTERVENTIONS:             1. GOALS OF CARE/ ADVANCE CARE PLANNING:  Patient is a DNR. Patient has MOST form completed. Goal is to keep patient at home.  2. SOCIAL/EMOTIONAL/SPIRITUAL ASSESSMENT/ INTERVENTIONS:  SW and RN completed TELEHEALTH visit with patient and [name] (patient's niece). [name] provided brief history. Patient was hospitalized this past week for hypokalemia. Patient was discharged home yesterday and is refusing food. [name] said patient will not eat and is only taking in sips. [name] was able to get patient to drink half of an Ensure this morning. Patient is bedbound. Patient had a "rattle" this morning per [name] and her oxygen is 89% on 3L of O2. Patient's sister, [name] and [name] provide all of patient care. Both agreeable to hospice referral. SW provided education on hospice and palliative services, discussed goals and care plan, used active listening and provided emotional support. [name] appreciative of phone call and support.  3. PATIENT/CAREGIVER EDUCATION/ COPING:  Patient is alert, will respond to questions. Patient has very supportive family. [name] was tearful processing their loss of another relative and the decline of patient.  4. PERSONAL EMERGENCY PLAN:  Family will call 9-1-1 for emergencies. 5. COMMUNITY RESOURCES COORDINATION/ HEALTH CARE NAVIGATION:  Family manages care. Patient is currently receiving CAP services, [name] is patient's assigned CNA. 6. FINANCIAL/LEGAL CONCERNS/INTERVENTIONS:  Patient has fixed income and is receiving Medicaid.      SOCIAL HX:  Social History   Tobacco Use  . Smoking status: Never  Smoker  . Smokeless tobacco: Never Used  Substance Use Topics  . Alcohol use: No    CODE STATUS:   Code Status: Prior (DNR) ADVANCED DIRECTIVES: N MOST FORM COMPLETE:  Yes. HOSPICE EDUCATION PROVIDED: Yes. Family agreeable to hospice referral, RN contacted medical director and PCP.  PPS: Patient is dependent of ADLs.  I spent 30 minutes with patient/family, from 12:45-1:15p providing education, support and consultation.   Laura Lombard, LCSW

## 2019-11-28 NOTE — Telephone Encounter (Signed)
Order to call [facility] hospice services ASAP. Dr Anastasio Champion talk with on the phone with Nurse Sarah to see what the next set is for care since D/c home.

## 2019-11-28 NOTE — Telephone Encounter (Addendum)
Telephone call to patient to schedule palliative care visit with patient. Patient/family in agreement with telephonic visit on 11-28-19 at 12:45PM.

## 2019-11-28 NOTE — Progress Notes (Signed)
PATIENT NAME: [name] DOB: 08/22/1941 MRN: [id number]  PRIMARY CARE PROVIDER: Doree Albee, MD  RESPONSIBLE PARTY:  Acct ID - Guarantor Home Phone Work Phone Relationship Acct Type  [id number] Silvestre [phone]  Self P/F     [address], [place], [place] [address]    PLAN OF CARE and INTERVENTIONS:               1.  GOALS OF CARE/ ADVANCE CARE PLANNING:  Family wants comfort care for patient and does not want patient to return to the hospital.               2.  PATIENT/CAREGIVER EDUCATION:  Education on s/s of disease progression and end of life care.               3.  DISEASE STATUS:SW and RN completed telephonic telehealth visit with patients niece [name]. [name] called her Mom (patients sister) and palliative care services explained. Patient is declining and family in agreement with hospice evaluation. Patient has been hospitalized twice in January. Patient has CHF, A-fib, history of pneumonia history of CoVid 19, Chronic kidney disease stage 3 and other comorbidities. Patient has PICC line in right arm and is receiving Ancef bid until February 3rd. [name] reports she was able to get patient to drink 1/2 Ensure and water into patient today. Patient is refusing food.  Patient is alert and will attempt to answer questions when spoken to per [facility]. [name] reports patient is on coumadin and has a large bruise from her armpit down to her hip. [name] feels patient was left lying on her side in hospital and caused bruising. Patient has not been weighed for a while.  Patient cannot stand.  [name] reports patient has been declining steadily for 4 months.  [name] reports patient was rattling this morning upon waking however rattling is better at the present time.  Patient receiving O2 via nasal cannula at 3 LPM.  Patients O2 sats currently 89% per [name]. [name] contacted patients sister [name] and all in agreement with hospice evaluation. Nurse called doctor Anastasio Champion and requested hospice  order for hospice eval and to determine if MD would be patient primary care provider. Nurse also contacted Dr. Clifton James and Dr. Clifton James in agreement with hospice evaluation. Home health nurse Judson Roch called and reported she spoke with Dr Anastasio Champion in agreement with hospice referral and will sign orders for patient.  Dr Clifton James informed MD in agreement with hospice evaluation.  [name] informed to call palliative care with questions or concerns.     HISTORY OF PRESENT ILLNESS:    CODE STATUS: DNR  ADVANCED DIRECTIVES: Y MOST FORM: No PPS: 30%   PHYSICAL EXAM:   VITALS: Today's Vitals   11/28/19 1702  BP: 112/84  Pulse: (!) 102  Resp: 18  SpO2: (!) 89%  PainSc: 0-No pain    LUNGS: [name] reports patient is having rattling. CARDIAC: Cor Tachy  EXTREMITIES:  SKIN:Denise reports bruising on patient.    NEURO: positive for gait problems, memory problems and weakness       Nilda Simmer, RN

## 2019-11-28 NOTE — Care Management Important Message (Signed)
Important Message  Patient Details  Name: [name] MRN: [id number] Date of Birth: 19-Apr-1941   Medicare Important Message Given:  Yes(given by Bonney Roussel due to precautions)     Tommy Medal 11/28/2019, 11:34 AM

## 2019-11-28 NOTE — Telephone Encounter (Signed)
As discussed with the patient's sister, we will not send the patient back to the hospital and she will be kept comfortable at home.  [facility] will be contacted for palliative medicine.

## 2019-11-28 NOTE — Telephone Encounter (Signed)
Vergie LVM that she needs a referral to hospice for Pt, and will you be the PCP under Hospice Care

## 2019-11-29 DIAGNOSIS — J302 Other seasonal allergic rhinitis: Secondary | ICD-10-CM | POA: Diagnosis not present

## 2019-11-29 DIAGNOSIS — Z8659 Personal history of other mental and behavioral disorders: Secondary | ICD-10-CM | POA: Diagnosis not present

## 2019-11-29 DIAGNOSIS — G629 Polyneuropathy, unspecified: Secondary | ICD-10-CM | POA: Diagnosis not present

## 2019-11-29 DIAGNOSIS — N189 Chronic kidney disease, unspecified: Secondary | ICD-10-CM | POA: Diagnosis not present

## 2019-11-29 DIAGNOSIS — D631 Anemia in chronic kidney disease: Secondary | ICD-10-CM | POA: Diagnosis not present

## 2019-11-29 DIAGNOSIS — F419 Anxiety disorder, unspecified: Secondary | ICD-10-CM | POA: Diagnosis not present

## 2019-11-29 DIAGNOSIS — I130 Hypertensive heart and chronic kidney disease with heart failure and stage 1 through stage 4 chronic kidney disease, or unspecified chronic kidney disease: Secondary | ICD-10-CM | POA: Diagnosis not present

## 2019-11-29 DIAGNOSIS — E46 Unspecified protein-calorie malnutrition: Secondary | ICD-10-CM | POA: Diagnosis not present

## 2019-11-29 DIAGNOSIS — R634 Abnormal weight loss: Secondary | ICD-10-CM | POA: Diagnosis not present

## 2019-11-29 DIAGNOSIS — H409 Unspecified glaucoma: Secondary | ICD-10-CM | POA: Diagnosis not present

## 2019-11-29 DIAGNOSIS — Z9981 Dependence on supplemental oxygen: Secondary | ICD-10-CM | POA: Diagnosis not present

## 2019-11-29 DIAGNOSIS — I509 Heart failure, unspecified: Secondary | ICD-10-CM | POA: Diagnosis not present

## 2019-11-29 DIAGNOSIS — Z7901 Long term (current) use of anticoagulants: Secondary | ICD-10-CM | POA: Diagnosis not present

## 2019-11-29 DIAGNOSIS — M109 Gout, unspecified: Secondary | ICD-10-CM | POA: Diagnosis not present

## 2019-11-29 DIAGNOSIS — G2581 Restless legs syndrome: Secondary | ICD-10-CM | POA: Diagnosis not present

## 2019-11-29 DIAGNOSIS — K2270 Barrett's esophagus without dysplasia: Secondary | ICD-10-CM | POA: Diagnosis not present

## 2019-11-29 DIAGNOSIS — J9611 Chronic respiratory failure with hypoxia: Secondary | ICD-10-CM | POA: Diagnosis not present

## 2019-11-29 DIAGNOSIS — R42 Dizziness and giddiness: Secondary | ICD-10-CM | POA: Diagnosis not present

## 2019-11-29 DIAGNOSIS — Z8616 Personal history of COVID-19: Secondary | ICD-10-CM | POA: Diagnosis not present

## 2019-11-29 DIAGNOSIS — E785 Hyperlipidemia, unspecified: Secondary | ICD-10-CM | POA: Diagnosis not present

## 2019-11-29 DIAGNOSIS — Z8619 Personal history of other infectious and parasitic diseases: Secondary | ICD-10-CM | POA: Diagnosis not present

## 2019-11-29 DIAGNOSIS — I4891 Unspecified atrial fibrillation: Secondary | ICD-10-CM | POA: Diagnosis not present

## 2019-11-30 DIAGNOSIS — I509 Heart failure, unspecified: Secondary | ICD-10-CM | POA: Diagnosis not present

## 2019-11-30 DIAGNOSIS — J9611 Chronic respiratory failure with hypoxia: Secondary | ICD-10-CM | POA: Diagnosis not present

## 2019-11-30 DIAGNOSIS — F419 Anxiety disorder, unspecified: Secondary | ICD-10-CM | POA: Diagnosis not present

## 2019-11-30 DIAGNOSIS — I130 Hypertensive heart and chronic kidney disease with heart failure and stage 1 through stage 4 chronic kidney disease, or unspecified chronic kidney disease: Secondary | ICD-10-CM | POA: Diagnosis not present

## 2019-11-30 DIAGNOSIS — N189 Chronic kidney disease, unspecified: Secondary | ICD-10-CM | POA: Diagnosis not present

## 2019-11-30 DIAGNOSIS — I4891 Unspecified atrial fibrillation: Secondary | ICD-10-CM | POA: Diagnosis not present

## 2019-12-01 DIAGNOSIS — I509 Heart failure, unspecified: Secondary | ICD-10-CM | POA: Diagnosis not present

## 2019-12-01 DIAGNOSIS — J9611 Chronic respiratory failure with hypoxia: Secondary | ICD-10-CM | POA: Diagnosis not present

## 2019-12-01 DIAGNOSIS — F419 Anxiety disorder, unspecified: Secondary | ICD-10-CM | POA: Diagnosis not present

## 2019-12-01 DIAGNOSIS — N189 Chronic kidney disease, unspecified: Secondary | ICD-10-CM | POA: Diagnosis not present

## 2019-12-01 DIAGNOSIS — I130 Hypertensive heart and chronic kidney disease with heart failure and stage 1 through stage 4 chronic kidney disease, or unspecified chronic kidney disease: Secondary | ICD-10-CM | POA: Diagnosis not present

## 2019-12-01 DIAGNOSIS — I4891 Unspecified atrial fibrillation: Secondary | ICD-10-CM | POA: Diagnosis not present

## 2019-12-01 LAB — POCT INR: INR: 5.4 — AB (ref 2.0–3.0)

## 2019-12-04 ENCOUNTER — Telehealth (INDEPENDENT_AMBULATORY_CARE_PROVIDER_SITE_OTHER): Payer: Self-pay | Admitting: Nurse Practitioner

## 2019-12-04 ENCOUNTER — Other Ambulatory Visit (INDEPENDENT_AMBULATORY_CARE_PROVIDER_SITE_OTHER): Payer: Self-pay | Admitting: Nurse Practitioner

## 2019-12-04 DIAGNOSIS — J302 Other seasonal allergic rhinitis: Secondary | ICD-10-CM | POA: Diagnosis not present

## 2019-12-04 DIAGNOSIS — J9611 Chronic respiratory failure with hypoxia: Secondary | ICD-10-CM | POA: Diagnosis not present

## 2019-12-04 DIAGNOSIS — F419 Anxiety disorder, unspecified: Secondary | ICD-10-CM | POA: Diagnosis not present

## 2019-12-04 DIAGNOSIS — Z8619 Personal history of other infectious and parasitic diseases: Secondary | ICD-10-CM | POA: Diagnosis not present

## 2019-12-04 DIAGNOSIS — E785 Hyperlipidemia, unspecified: Secondary | ICD-10-CM | POA: Diagnosis not present

## 2019-12-04 DIAGNOSIS — I509 Heart failure, unspecified: Secondary | ICD-10-CM | POA: Diagnosis not present

## 2019-12-04 DIAGNOSIS — I4891 Unspecified atrial fibrillation: Secondary | ICD-10-CM | POA: Diagnosis not present

## 2019-12-04 DIAGNOSIS — E46 Unspecified protein-calorie malnutrition: Secondary | ICD-10-CM | POA: Diagnosis not present

## 2019-12-04 DIAGNOSIS — I130 Hypertensive heart and chronic kidney disease with heart failure and stage 1 through stage 4 chronic kidney disease, or unspecified chronic kidney disease: Secondary | ICD-10-CM | POA: Diagnosis not present

## 2019-12-04 DIAGNOSIS — G629 Polyneuropathy, unspecified: Secondary | ICD-10-CM | POA: Diagnosis not present

## 2019-12-04 DIAGNOSIS — H409 Unspecified glaucoma: Secondary | ICD-10-CM | POA: Diagnosis not present

## 2019-12-04 DIAGNOSIS — Z9981 Dependence on supplemental oxygen: Secondary | ICD-10-CM | POA: Diagnosis not present

## 2019-12-04 DIAGNOSIS — Z8616 Personal history of COVID-19: Secondary | ICD-10-CM | POA: Diagnosis not present

## 2019-12-04 DIAGNOSIS — Z7901 Long term (current) use of anticoagulants: Secondary | ICD-10-CM | POA: Diagnosis not present

## 2019-12-04 DIAGNOSIS — G2581 Restless legs syndrome: Secondary | ICD-10-CM | POA: Diagnosis not present

## 2019-12-04 DIAGNOSIS — K2270 Barrett's esophagus without dysplasia: Secondary | ICD-10-CM | POA: Diagnosis not present

## 2019-12-04 DIAGNOSIS — M109 Gout, unspecified: Secondary | ICD-10-CM | POA: Diagnosis not present

## 2019-12-04 DIAGNOSIS — N189 Chronic kidney disease, unspecified: Secondary | ICD-10-CM | POA: Diagnosis not present

## 2019-12-04 DIAGNOSIS — Z8659 Personal history of other mental and behavioral disorders: Secondary | ICD-10-CM | POA: Diagnosis not present

## 2019-12-04 DIAGNOSIS — R634 Abnormal weight loss: Secondary | ICD-10-CM | POA: Diagnosis not present

## 2019-12-04 DIAGNOSIS — R42 Dizziness and giddiness: Secondary | ICD-10-CM | POA: Diagnosis not present

## 2019-12-04 DIAGNOSIS — D631 Anemia in chronic kidney disease: Secondary | ICD-10-CM | POA: Diagnosis not present

## 2019-12-04 NOTE — Telephone Encounter (Signed)
Okay, sounds good

## 2019-12-04 NOTE — Telephone Encounter (Signed)
This patient's caregiver checks the patient's INR on Friday (12/01/2019).  Our office is closed on Fridays.  So no one was able to address the patient's INR.  It was 5.4 which is out of goal.  The caregiver did hold the patient's Coumadin on Saturday and Sunday.  The patient is taking 1.5 mg daily.  I was able to call the caregiver [name]) and told her to check the patient's INR again tomorrow.  Patient was recently referred to hospice.  Dr. Anastasio Champion, are we going to continue monitoring the patient's INR, or does the patient now see a physician from hospice who should be taking over this monitoring?  We will follow up with caregiver tomorrow once I receive next INR result.

## 2019-12-04 NOTE — Telephone Encounter (Signed)
I did call this patient's caregiver [name]) as well as her healthcare power of attorney [name]) to discuss the situation.  The patient's healthcare power of attorney would like to discuss whether or not she wants to stop the patient's Coumadin with her family members prior to making this decision.  For now she would like the patient to continue on Coumadin until this decision has been made.  I did let [name] know, and [name] will check the patient's INR tomorrow as we discussed earlier, and we will continue patient on Coumadin until the healthcare power of attorney has had the opportunity to discuss stopping this medication with her other family members.

## 2019-12-04 NOTE — Telephone Encounter (Signed)
Good morning! I think in view of the fact that the patient is now with hospice/palliative medicine, and she is on anticoagulation because of atrial fibrillation to prevent stroke, I think it is not fruitful to continue with anticoagulation. I would recommend that we discontinue Coumadin altogether and make sure she is on aspirin as long as she can tolerate it.  This will not provide as good prevention for stroke but will provide some. In the final analysis, her prognosis is poor with aftereffects of COVID-19 and I do not want her to get blood work done for INR on a weekly basis anymore.  Please communicate this with [name]. Hope this makes sense.

## 2019-12-05 ENCOUNTER — Encounter: Payer: Self-pay | Admitting: Internal Medicine

## 2019-12-05 ENCOUNTER — Other Ambulatory Visit (INDEPENDENT_AMBULATORY_CARE_PROVIDER_SITE_OTHER): Payer: Self-pay | Admitting: Nurse Practitioner

## 2019-12-05 ENCOUNTER — Telehealth (INDEPENDENT_AMBULATORY_CARE_PROVIDER_SITE_OTHER): Payer: Self-pay

## 2019-12-05 DIAGNOSIS — I509 Heart failure, unspecified: Secondary | ICD-10-CM | POA: Diagnosis not present

## 2019-12-05 DIAGNOSIS — F419 Anxiety disorder, unspecified: Secondary | ICD-10-CM | POA: Diagnosis not present

## 2019-12-05 DIAGNOSIS — K1379 Other lesions of oral mucosa: Secondary | ICD-10-CM

## 2019-12-05 DIAGNOSIS — N189 Chronic kidney disease, unspecified: Secondary | ICD-10-CM | POA: Diagnosis not present

## 2019-12-05 DIAGNOSIS — I130 Hypertensive heart and chronic kidney disease with heart failure and stage 1 through stage 4 chronic kidney disease, or unspecified chronic kidney disease: Secondary | ICD-10-CM | POA: Diagnosis not present

## 2019-12-05 DIAGNOSIS — J9611 Chronic respiratory failure with hypoxia: Secondary | ICD-10-CM | POA: Diagnosis not present

## 2019-12-05 DIAGNOSIS — I4891 Unspecified atrial fibrillation: Secondary | ICD-10-CM | POA: Diagnosis not present

## 2019-12-05 LAB — PROTIME-INR: INR: 5.7 — AB (ref 0.9–1.1)

## 2019-12-05 MED ORDER — MAGIC MOUTHWASH W/LIDOCAINE: 5.0000 mL | Freq: Three times a day (TID) | ORAL | 2 refills | Status: DC | PRN

## 2019-12-05 NOTE — Telephone Encounter (Signed)
Verbal Order: To d/c PICC IV line. Pt was receving antibotics. Wanted to take  outtoday. Pt got last dose today.

## 2019-12-05 NOTE — Telephone Encounter (Signed)
Yes,okay to DC [employer]

## 2019-12-05 NOTE — Progress Notes (Signed)
Patient's INR has resulted and it is 5.7.  This is above goal.  I have called the patient's caregiver [name]) and told her to hold the patient's warfarin today and tomorrow.  Patient is currently taking 1.5 mg 5 days a week for a total weekly dose of 7.5 mg.  Patient's caregiver will recheck patient's INR on Thursday.  We will need to consider reducing weekly dose by approximately 20%.  Will make determination when patient's INR result is called in on Thursday.  In addition patient's caregiver tells me that the patient has been having mouth sores and pain.  She is wondering if there is something she can give her.  I will prescribe Magic mouthwash for patient to swish and spit 3 times a day as needed.

## 2019-12-06 DIAGNOSIS — I4891 Unspecified atrial fibrillation: Secondary | ICD-10-CM | POA: Diagnosis not present

## 2019-12-06 DIAGNOSIS — N189 Chronic kidney disease, unspecified: Secondary | ICD-10-CM | POA: Diagnosis not present

## 2019-12-06 DIAGNOSIS — I130 Hypertensive heart and chronic kidney disease with heart failure and stage 1 through stage 4 chronic kidney disease, or unspecified chronic kidney disease: Secondary | ICD-10-CM | POA: Diagnosis not present

## 2019-12-06 DIAGNOSIS — I509 Heart failure, unspecified: Secondary | ICD-10-CM | POA: Diagnosis not present

## 2019-12-06 DIAGNOSIS — F419 Anxiety disorder, unspecified: Secondary | ICD-10-CM | POA: Diagnosis not present

## 2019-12-06 DIAGNOSIS — J9611 Chronic respiratory failure with hypoxia: Secondary | ICD-10-CM | POA: Diagnosis not present

## 2019-12-06 NOTE — Telephone Encounter (Signed)
Pt  verbal order back to [facility] care hospice nurse called back to D/C PICC line.ph:[phone].

## 2019-12-07 ENCOUNTER — Telehealth (INDEPENDENT_AMBULATORY_CARE_PROVIDER_SITE_OTHER): Payer: Self-pay | Admitting: Internal Medicine

## 2019-12-07 DIAGNOSIS — N189 Chronic kidney disease, unspecified: Secondary | ICD-10-CM | POA: Diagnosis not present

## 2019-12-07 DIAGNOSIS — F419 Anxiety disorder, unspecified: Secondary | ICD-10-CM | POA: Diagnosis not present

## 2019-12-07 DIAGNOSIS — I4891 Unspecified atrial fibrillation: Secondary | ICD-10-CM | POA: Diagnosis not present

## 2019-12-07 DIAGNOSIS — I509 Heart failure, unspecified: Secondary | ICD-10-CM | POA: Diagnosis not present

## 2019-12-07 DIAGNOSIS — J9611 Chronic respiratory failure with hypoxia: Secondary | ICD-10-CM | POA: Diagnosis not present

## 2019-12-07 DIAGNOSIS — I130 Hypertensive heart and chronic kidney disease with heart failure and stage 1 through stage 4 chronic kidney disease, or unspecified chronic kidney disease: Secondary | ICD-10-CM | POA: Diagnosis not present

## 2019-12-07 NOTE — Telephone Encounter (Signed)
I did call patient's caregiver [name]) back regarding patient's INR.  It continues to rise.  I have told the caregiver to continue holding the patient's Coumadin.  I recommended that she hold it for the weekend and then recheck the INR again on Monday and call us with this result.  Caregiver denies any signs of bleeding such as blood in stool, bleeding from gums, or new bruises.  I did tell the caregiver that if the patient exhibits any signs of bleeding that she needs to be evaluated emergently in the hospital and probably given vitamin K.  The caregiver tells me she understands.

## 2019-12-12 DIAGNOSIS — I130 Hypertensive heart and chronic kidney disease with heart failure and stage 1 through stage 4 chronic kidney disease, or unspecified chronic kidney disease: Secondary | ICD-10-CM | POA: Diagnosis not present

## 2019-12-12 DIAGNOSIS — J9611 Chronic respiratory failure with hypoxia: Secondary | ICD-10-CM | POA: Diagnosis not present

## 2019-12-12 DIAGNOSIS — N189 Chronic kidney disease, unspecified: Secondary | ICD-10-CM | POA: Diagnosis not present

## 2019-12-12 DIAGNOSIS — F419 Anxiety disorder, unspecified: Secondary | ICD-10-CM | POA: Diagnosis not present

## 2019-12-12 DIAGNOSIS — I509 Heart failure, unspecified: Secondary | ICD-10-CM | POA: Diagnosis not present

## 2019-12-12 DIAGNOSIS — I4891 Unspecified atrial fibrillation: Secondary | ICD-10-CM | POA: Diagnosis not present

## 2019-12-13 ENCOUNTER — Encounter: Payer: Self-pay | Admitting: Internal Medicine

## 2019-12-13 DIAGNOSIS — Z7901 Long term (current) use of anticoagulants: Secondary | ICD-10-CM | POA: Diagnosis not present

## 2019-12-13 DIAGNOSIS — F419 Anxiety disorder, unspecified: Secondary | ICD-10-CM | POA: Diagnosis not present

## 2019-12-13 DIAGNOSIS — J9611 Chronic respiratory failure with hypoxia: Secondary | ICD-10-CM | POA: Diagnosis not present

## 2019-12-13 DIAGNOSIS — I130 Hypertensive heart and chronic kidney disease with heart failure and stage 1 through stage 4 chronic kidney disease, or unspecified chronic kidney disease: Secondary | ICD-10-CM | POA: Diagnosis not present

## 2019-12-13 DIAGNOSIS — I4891 Unspecified atrial fibrillation: Secondary | ICD-10-CM | POA: Diagnosis not present

## 2019-12-13 DIAGNOSIS — N189 Chronic kidney disease, unspecified: Secondary | ICD-10-CM | POA: Diagnosis not present

## 2019-12-13 DIAGNOSIS — I509 Heart failure, unspecified: Secondary | ICD-10-CM | POA: Diagnosis not present

## 2019-12-13 LAB — POCT INR: INR: 1.4 — AB (ref 2.0–3.0)

## 2019-12-14 DIAGNOSIS — I509 Heart failure, unspecified: Secondary | ICD-10-CM | POA: Diagnosis not present

## 2019-12-14 DIAGNOSIS — N189 Chronic kidney disease, unspecified: Secondary | ICD-10-CM | POA: Diagnosis not present

## 2019-12-14 DIAGNOSIS — I4891 Unspecified atrial fibrillation: Secondary | ICD-10-CM | POA: Diagnosis not present

## 2019-12-14 DIAGNOSIS — F419 Anxiety disorder, unspecified: Secondary | ICD-10-CM | POA: Diagnosis not present

## 2019-12-14 DIAGNOSIS — J9611 Chronic respiratory failure with hypoxia: Secondary | ICD-10-CM | POA: Diagnosis not present

## 2019-12-14 DIAGNOSIS — I130 Hypertensive heart and chronic kidney disease with heart failure and stage 1 through stage 4 chronic kidney disease, or unspecified chronic kidney disease: Secondary | ICD-10-CM | POA: Diagnosis not present

## 2019-12-19 ENCOUNTER — Encounter: Payer: Self-pay | Admitting: Internal Medicine

## 2019-12-19 ENCOUNTER — Telehealth (INDEPENDENT_AMBULATORY_CARE_PROVIDER_SITE_OTHER): Payer: Self-pay | Admitting: Nurse Practitioner

## 2019-12-19 DIAGNOSIS — I509 Heart failure, unspecified: Secondary | ICD-10-CM | POA: Diagnosis not present

## 2019-12-19 DIAGNOSIS — I4891 Unspecified atrial fibrillation: Secondary | ICD-10-CM | POA: Diagnosis not present

## 2019-12-19 DIAGNOSIS — N189 Chronic kidney disease, unspecified: Secondary | ICD-10-CM | POA: Diagnosis not present

## 2019-12-19 DIAGNOSIS — F419 Anxiety disorder, unspecified: Secondary | ICD-10-CM | POA: Diagnosis not present

## 2019-12-19 DIAGNOSIS — J9611 Chronic respiratory failure with hypoxia: Secondary | ICD-10-CM | POA: Diagnosis not present

## 2019-12-19 DIAGNOSIS — I130 Hypertensive heart and chronic kidney disease with heart failure and stage 1 through stage 4 chronic kidney disease, or unspecified chronic kidney disease: Secondary | ICD-10-CM | POA: Diagnosis not present

## 2019-12-19 LAB — POCT INR: INR: 1.4 — AB (ref 2.0–3.0)

## 2019-12-19 MED ORDER — WARFARIN SODIUM 3 MG PO TABS: 1.5000 mg | ORAL_TABLET | ORAL | 0 refills | Status: DC

## 2019-12-19 NOTE — Telephone Encounter (Signed)
Patient's INR result has come through today it was 1.4.  I called this patient's caregiver [name]), and she tells me that she just restarted the patient back on her Coumadin this past Friday, but held the Coumadin over the weekend.  So the patient has gone a total of 4-1/2 mg since Friday.  [name] is going to keep her on her skin gradual dose which is 1.5 mg by mouth 5 days a week.  She will recheck the patient's INR in 2 days, and I will review it at that time.

## 2019-12-20 DIAGNOSIS — N189 Chronic kidney disease, unspecified: Secondary | ICD-10-CM | POA: Diagnosis not present

## 2019-12-20 DIAGNOSIS — I130 Hypertensive heart and chronic kidney disease with heart failure and stage 1 through stage 4 chronic kidney disease, or unspecified chronic kidney disease: Secondary | ICD-10-CM | POA: Diagnosis not present

## 2019-12-20 DIAGNOSIS — I4891 Unspecified atrial fibrillation: Secondary | ICD-10-CM | POA: Diagnosis not present

## 2019-12-20 DIAGNOSIS — J9611 Chronic respiratory failure with hypoxia: Secondary | ICD-10-CM | POA: Diagnosis not present

## 2019-12-20 DIAGNOSIS — I509 Heart failure, unspecified: Secondary | ICD-10-CM | POA: Diagnosis not present

## 2019-12-20 DIAGNOSIS — F419 Anxiety disorder, unspecified: Secondary | ICD-10-CM | POA: Diagnosis not present

## 2019-12-21 ENCOUNTER — Telehealth (INDEPENDENT_AMBULATORY_CARE_PROVIDER_SITE_OTHER): Payer: Self-pay | Admitting: Nurse Practitioner

## 2019-12-21 ENCOUNTER — Encounter: Payer: Self-pay | Admitting: Internal Medicine

## 2019-12-21 LAB — POCT INR: INR: 1.5 — AB (ref 2.0–3.0)

## 2019-12-21 NOTE — Telephone Encounter (Signed)
I called this patient's caregiver, [name], to check to see what the patient's INR was today.  She tells me it was 1.5.  That is below the patient's goal of 2.0-3.0.  I told [name] to make sure the patient gets her dose today and tomorrow.  Because the patient usually holds her Coumadin on Saturday and Sunday, I told [name] that the patient may hold the Coumadin on Sunday but that she should check the INR again on Monday and that I would check in with them on Monday to see what the INR is at that time.  She tells me that she understands.

## 2019-12-25 ENCOUNTER — Encounter: Payer: Self-pay | Admitting: Internal Medicine

## 2019-12-25 ENCOUNTER — Telehealth (INDEPENDENT_AMBULATORY_CARE_PROVIDER_SITE_OTHER): Payer: Self-pay

## 2019-12-25 LAB — POCT INR: INR: 2.3 (ref 2.0–3.0)

## 2019-12-25 NOTE — Telephone Encounter (Signed)
Continue with same dose of Coumadin,thanks. 

## 2019-12-25 NOTE — Telephone Encounter (Signed)
INR results from 12/21/2019- 1.5 /  12/25/19-2.3  Please advise of any changes

## 2019-12-26 DIAGNOSIS — J9611 Chronic respiratory failure with hypoxia: Secondary | ICD-10-CM | POA: Diagnosis not present

## 2019-12-26 DIAGNOSIS — F419 Anxiety disorder, unspecified: Secondary | ICD-10-CM | POA: Diagnosis not present

## 2019-12-26 DIAGNOSIS — I509 Heart failure, unspecified: Secondary | ICD-10-CM | POA: Diagnosis not present

## 2019-12-26 DIAGNOSIS — I130 Hypertensive heart and chronic kidney disease with heart failure and stage 1 through stage 4 chronic kidney disease, or unspecified chronic kidney disease: Secondary | ICD-10-CM | POA: Diagnosis not present

## 2019-12-26 DIAGNOSIS — I4891 Unspecified atrial fibrillation: Secondary | ICD-10-CM | POA: Diagnosis not present

## 2019-12-26 DIAGNOSIS — N189 Chronic kidney disease, unspecified: Secondary | ICD-10-CM | POA: Diagnosis not present

## 2019-12-26 NOTE — Telephone Encounter (Signed)
Pt caregiver, [name] was Given the instructions on coumadin.

## 2019-12-27 DIAGNOSIS — N189 Chronic kidney disease, unspecified: Secondary | ICD-10-CM | POA: Diagnosis not present

## 2019-12-27 DIAGNOSIS — F419 Anxiety disorder, unspecified: Secondary | ICD-10-CM | POA: Diagnosis not present

## 2019-12-27 DIAGNOSIS — I4891 Unspecified atrial fibrillation: Secondary | ICD-10-CM | POA: Diagnosis not present

## 2019-12-27 DIAGNOSIS — I509 Heart failure, unspecified: Secondary | ICD-10-CM | POA: Diagnosis not present

## 2019-12-27 DIAGNOSIS — J9611 Chronic respiratory failure with hypoxia: Secondary | ICD-10-CM | POA: Diagnosis not present

## 2019-12-27 DIAGNOSIS — I130 Hypertensive heart and chronic kidney disease with heart failure and stage 1 through stage 4 chronic kidney disease, or unspecified chronic kidney disease: Secondary | ICD-10-CM | POA: Diagnosis not present

## 2019-12-28 DIAGNOSIS — N189 Chronic kidney disease, unspecified: Secondary | ICD-10-CM | POA: Diagnosis not present

## 2019-12-28 DIAGNOSIS — I4891 Unspecified atrial fibrillation: Secondary | ICD-10-CM | POA: Diagnosis not present

## 2019-12-28 DIAGNOSIS — F419 Anxiety disorder, unspecified: Secondary | ICD-10-CM | POA: Diagnosis not present

## 2019-12-28 DIAGNOSIS — J9611 Chronic respiratory failure with hypoxia: Secondary | ICD-10-CM | POA: Diagnosis not present

## 2019-12-28 DIAGNOSIS — I130 Hypertensive heart and chronic kidney disease with heart failure and stage 1 through stage 4 chronic kidney disease, or unspecified chronic kidney disease: Secondary | ICD-10-CM | POA: Diagnosis not present

## 2019-12-28 DIAGNOSIS — I509 Heart failure, unspecified: Secondary | ICD-10-CM | POA: Diagnosis not present

## 2020-01-01 ENCOUNTER — Encounter: Payer: Self-pay | Admitting: Internal Medicine

## 2020-01-01 ENCOUNTER — Telehealth (INDEPENDENT_AMBULATORY_CARE_PROVIDER_SITE_OTHER): Payer: Self-pay | Admitting: Nurse Practitioner

## 2020-01-01 DIAGNOSIS — E785 Hyperlipidemia, unspecified: Secondary | ICD-10-CM | POA: Diagnosis not present

## 2020-01-01 DIAGNOSIS — Z8616 Personal history of COVID-19: Secondary | ICD-10-CM | POA: Diagnosis not present

## 2020-01-01 DIAGNOSIS — G2581 Restless legs syndrome: Secondary | ICD-10-CM | POA: Diagnosis not present

## 2020-01-01 DIAGNOSIS — D631 Anemia in chronic kidney disease: Secondary | ICD-10-CM | POA: Diagnosis not present

## 2020-01-01 DIAGNOSIS — J302 Other seasonal allergic rhinitis: Secondary | ICD-10-CM | POA: Diagnosis not present

## 2020-01-01 DIAGNOSIS — K2270 Barrett's esophagus without dysplasia: Secondary | ICD-10-CM | POA: Diagnosis not present

## 2020-01-01 DIAGNOSIS — H409 Unspecified glaucoma: Secondary | ICD-10-CM | POA: Diagnosis not present

## 2020-01-01 DIAGNOSIS — M109 Gout, unspecified: Secondary | ICD-10-CM | POA: Diagnosis not present

## 2020-01-01 DIAGNOSIS — I509 Heart failure, unspecified: Secondary | ICD-10-CM | POA: Diagnosis not present

## 2020-01-01 DIAGNOSIS — N189 Chronic kidney disease, unspecified: Secondary | ICD-10-CM | POA: Diagnosis not present

## 2020-01-01 DIAGNOSIS — I130 Hypertensive heart and chronic kidney disease with heart failure and stage 1 through stage 4 chronic kidney disease, or unspecified chronic kidney disease: Secondary | ICD-10-CM | POA: Diagnosis not present

## 2020-01-01 DIAGNOSIS — J9611 Chronic respiratory failure with hypoxia: Secondary | ICD-10-CM | POA: Diagnosis not present

## 2020-01-01 DIAGNOSIS — Z7901 Long term (current) use of anticoagulants: Secondary | ICD-10-CM | POA: Diagnosis not present

## 2020-01-01 DIAGNOSIS — I4891 Unspecified atrial fibrillation: Secondary | ICD-10-CM | POA: Diagnosis not present

## 2020-01-01 DIAGNOSIS — E46 Unspecified protein-calorie malnutrition: Secondary | ICD-10-CM | POA: Diagnosis not present

## 2020-01-01 DIAGNOSIS — Z8659 Personal history of other mental and behavioral disorders: Secondary | ICD-10-CM | POA: Diagnosis not present

## 2020-01-01 DIAGNOSIS — G629 Polyneuropathy, unspecified: Secondary | ICD-10-CM | POA: Diagnosis not present

## 2020-01-01 DIAGNOSIS — R634 Abnormal weight loss: Secondary | ICD-10-CM | POA: Diagnosis not present

## 2020-01-01 DIAGNOSIS — F419 Anxiety disorder, unspecified: Secondary | ICD-10-CM | POA: Diagnosis not present

## 2020-01-01 DIAGNOSIS — Z9981 Dependence on supplemental oxygen: Secondary | ICD-10-CM | POA: Diagnosis not present

## 2020-01-01 DIAGNOSIS — Z8619 Personal history of other infectious and parasitic diseases: Secondary | ICD-10-CM | POA: Diagnosis not present

## 2020-01-01 DIAGNOSIS — R42 Dizziness and giddiness: Secondary | ICD-10-CM | POA: Diagnosis not present

## 2020-01-01 LAB — POCT INR: INR: 2 (ref 2.0–3.0)

## 2020-01-01 NOTE — Telephone Encounter (Signed)
Patient's INR result has come through this morning via fax.  It is 2.0, which is at goal. [name], please call this patient's caregiver and let them know that the patient should continue on her same dose of Coumadin.  Thank you.

## 2020-01-01 NOTE — Telephone Encounter (Signed)
Called caregiver/niece [name]. She will take to send on thursday. Pt is not hardly eating and drinking.

## 2020-01-01 NOTE — Telephone Encounter (Signed)
Ok, do they feel she needs to be evaluated? I believe she is with hospice primarily now, is this correct?

## 2020-01-02 DIAGNOSIS — F419 Anxiety disorder, unspecified: Secondary | ICD-10-CM | POA: Diagnosis not present

## 2020-01-02 DIAGNOSIS — I509 Heart failure, unspecified: Secondary | ICD-10-CM | POA: Diagnosis not present

## 2020-01-02 DIAGNOSIS — N189 Chronic kidney disease, unspecified: Secondary | ICD-10-CM | POA: Diagnosis not present

## 2020-01-02 DIAGNOSIS — J9611 Chronic respiratory failure with hypoxia: Secondary | ICD-10-CM | POA: Diagnosis not present

## 2020-01-02 DIAGNOSIS — I130 Hypertensive heart and chronic kidney disease with heart failure and stage 1 through stage 4 chronic kidney disease, or unspecified chronic kidney disease: Secondary | ICD-10-CM | POA: Diagnosis not present

## 2020-01-02 DIAGNOSIS — I4891 Unspecified atrial fibrillation: Secondary | ICD-10-CM | POA: Diagnosis not present

## 2020-01-03 DIAGNOSIS — F419 Anxiety disorder, unspecified: Secondary | ICD-10-CM | POA: Diagnosis not present

## 2020-01-03 DIAGNOSIS — I4891 Unspecified atrial fibrillation: Secondary | ICD-10-CM | POA: Diagnosis not present

## 2020-01-03 DIAGNOSIS — I130 Hypertensive heart and chronic kidney disease with heart failure and stage 1 through stage 4 chronic kidney disease, or unspecified chronic kidney disease: Secondary | ICD-10-CM | POA: Diagnosis not present

## 2020-01-03 DIAGNOSIS — J9611 Chronic respiratory failure with hypoxia: Secondary | ICD-10-CM | POA: Diagnosis not present

## 2020-01-03 DIAGNOSIS — N189 Chronic kidney disease, unspecified: Secondary | ICD-10-CM | POA: Diagnosis not present

## 2020-01-03 DIAGNOSIS — I509 Heart failure, unspecified: Secondary | ICD-10-CM | POA: Diagnosis not present

## 2020-01-04 DIAGNOSIS — G629 Polyneuropathy, unspecified: Secondary | ICD-10-CM | POA: Diagnosis not present

## 2020-01-04 DIAGNOSIS — N189 Chronic kidney disease, unspecified: Secondary | ICD-10-CM | POA: Diagnosis not present

## 2020-01-04 DIAGNOSIS — E785 Hyperlipidemia, unspecified: Secondary | ICD-10-CM | POA: Diagnosis not present

## 2020-01-04 DIAGNOSIS — Z8616 Personal history of COVID-19: Secondary | ICD-10-CM

## 2020-01-04 DIAGNOSIS — E46 Unspecified protein-calorie malnutrition: Secondary | ICD-10-CM | POA: Diagnosis not present

## 2020-01-04 DIAGNOSIS — F419 Anxiety disorder, unspecified: Secondary | ICD-10-CM | POA: Diagnosis not present

## 2020-01-04 DIAGNOSIS — R42 Dizziness and giddiness: Secondary | ICD-10-CM | POA: Diagnosis not present

## 2020-01-04 DIAGNOSIS — I4891 Unspecified atrial fibrillation: Secondary | ICD-10-CM | POA: Diagnosis not present

## 2020-01-04 DIAGNOSIS — Z9981 Dependence on supplemental oxygen: Secondary | ICD-10-CM

## 2020-01-04 DIAGNOSIS — I130 Hypertensive heart and chronic kidney disease with heart failure and stage 1 through stage 4 chronic kidney disease, or unspecified chronic kidney disease: Secondary | ICD-10-CM | POA: Diagnosis not present

## 2020-01-04 DIAGNOSIS — D631 Anemia in chronic kidney disease: Secondary | ICD-10-CM | POA: Diagnosis not present

## 2020-01-04 DIAGNOSIS — I509 Heart failure, unspecified: Secondary | ICD-10-CM | POA: Diagnosis not present

## 2020-01-04 DIAGNOSIS — R634 Abnormal weight loss: Secondary | ICD-10-CM

## 2020-01-04 DIAGNOSIS — K2270 Barrett's esophagus without dysplasia: Secondary | ICD-10-CM | POA: Diagnosis not present

## 2020-01-04 DIAGNOSIS — J9611 Chronic respiratory failure with hypoxia: Secondary | ICD-10-CM | POA: Diagnosis not present

## 2020-01-09 DIAGNOSIS — F419 Anxiety disorder, unspecified: Secondary | ICD-10-CM | POA: Diagnosis not present

## 2020-01-09 DIAGNOSIS — I509 Heart failure, unspecified: Secondary | ICD-10-CM | POA: Diagnosis not present

## 2020-01-09 DIAGNOSIS — I4891 Unspecified atrial fibrillation: Secondary | ICD-10-CM | POA: Diagnosis not present

## 2020-01-09 DIAGNOSIS — I130 Hypertensive heart and chronic kidney disease with heart failure and stage 1 through stage 4 chronic kidney disease, or unspecified chronic kidney disease: Secondary | ICD-10-CM | POA: Diagnosis not present

## 2020-01-09 DIAGNOSIS — J9611 Chronic respiratory failure with hypoxia: Secondary | ICD-10-CM | POA: Diagnosis not present

## 2020-01-09 DIAGNOSIS — N189 Chronic kidney disease, unspecified: Secondary | ICD-10-CM | POA: Diagnosis not present

## 2020-01-10 DIAGNOSIS — N189 Chronic kidney disease, unspecified: Secondary | ICD-10-CM | POA: Diagnosis not present

## 2020-01-10 DIAGNOSIS — Z7901 Long term (current) use of anticoagulants: Secondary | ICD-10-CM | POA: Diagnosis not present

## 2020-01-10 DIAGNOSIS — F419 Anxiety disorder, unspecified: Secondary | ICD-10-CM | POA: Diagnosis not present

## 2020-01-10 DIAGNOSIS — I4891 Unspecified atrial fibrillation: Secondary | ICD-10-CM | POA: Diagnosis not present

## 2020-01-10 DIAGNOSIS — I509 Heart failure, unspecified: Secondary | ICD-10-CM | POA: Diagnosis not present

## 2020-01-10 DIAGNOSIS — J9611 Chronic respiratory failure with hypoxia: Secondary | ICD-10-CM | POA: Diagnosis not present

## 2020-01-10 DIAGNOSIS — I130 Hypertensive heart and chronic kidney disease with heart failure and stage 1 through stage 4 chronic kidney disease, or unspecified chronic kidney disease: Secondary | ICD-10-CM | POA: Diagnosis not present

## 2020-01-10 LAB — PROTIME-INR: INR: 1.8 — AB (ref 0.9–1.1)

## 2020-01-11 DIAGNOSIS — I509 Heart failure, unspecified: Secondary | ICD-10-CM | POA: Diagnosis not present

## 2020-01-11 DIAGNOSIS — I130 Hypertensive heart and chronic kidney disease with heart failure and stage 1 through stage 4 chronic kidney disease, or unspecified chronic kidney disease: Secondary | ICD-10-CM | POA: Diagnosis not present

## 2020-01-11 DIAGNOSIS — I4891 Unspecified atrial fibrillation: Secondary | ICD-10-CM | POA: Diagnosis not present

## 2020-01-11 DIAGNOSIS — F419 Anxiety disorder, unspecified: Secondary | ICD-10-CM | POA: Diagnosis not present

## 2020-01-11 DIAGNOSIS — N189 Chronic kidney disease, unspecified: Secondary | ICD-10-CM | POA: Diagnosis not present

## 2020-01-11 DIAGNOSIS — J9611 Chronic respiratory failure with hypoxia: Secondary | ICD-10-CM | POA: Diagnosis not present

## 2020-01-16 DIAGNOSIS — J9611 Chronic respiratory failure with hypoxia: Secondary | ICD-10-CM | POA: Diagnosis not present

## 2020-01-16 DIAGNOSIS — I509 Heart failure, unspecified: Secondary | ICD-10-CM | POA: Diagnosis not present

## 2020-01-16 DIAGNOSIS — F419 Anxiety disorder, unspecified: Secondary | ICD-10-CM | POA: Diagnosis not present

## 2020-01-16 DIAGNOSIS — N189 Chronic kidney disease, unspecified: Secondary | ICD-10-CM | POA: Diagnosis not present

## 2020-01-16 DIAGNOSIS — I130 Hypertensive heart and chronic kidney disease with heart failure and stage 1 through stage 4 chronic kidney disease, or unspecified chronic kidney disease: Secondary | ICD-10-CM | POA: Diagnosis not present

## 2020-01-16 DIAGNOSIS — I4891 Unspecified atrial fibrillation: Secondary | ICD-10-CM | POA: Diagnosis not present

## 2020-01-17 ENCOUNTER — Telehealth (INDEPENDENT_AMBULATORY_CARE_PROVIDER_SITE_OTHER): Payer: Self-pay | Admitting: Nurse Practitioner

## 2020-01-17 DIAGNOSIS — N189 Chronic kidney disease, unspecified: Secondary | ICD-10-CM | POA: Diagnosis not present

## 2020-01-17 DIAGNOSIS — I509 Heart failure, unspecified: Secondary | ICD-10-CM | POA: Diagnosis not present

## 2020-01-17 DIAGNOSIS — J9611 Chronic respiratory failure with hypoxia: Secondary | ICD-10-CM | POA: Diagnosis not present

## 2020-01-17 DIAGNOSIS — I130 Hypertensive heart and chronic kidney disease with heart failure and stage 1 through stage 4 chronic kidney disease, or unspecified chronic kidney disease: Secondary | ICD-10-CM | POA: Diagnosis not present

## 2020-01-17 DIAGNOSIS — Z7901 Long term (current) use of anticoagulants: Secondary | ICD-10-CM

## 2020-01-17 DIAGNOSIS — F419 Anxiety disorder, unspecified: Secondary | ICD-10-CM | POA: Diagnosis not present

## 2020-01-17 DIAGNOSIS — I4891 Unspecified atrial fibrillation: Secondary | ICD-10-CM | POA: Diagnosis not present

## 2020-01-17 LAB — PROTIME-INR: INR: 2.5 — AB (ref 0.9–1.1)

## 2020-01-17 NOTE — Telephone Encounter (Signed)
Patients daughter is aware of results and recommendation

## 2020-01-17 NOTE — Telephone Encounter (Signed)
01/17/20: This patient's INR is 2.5 on 01/17/20.They are at goal. Please call them and let them know to continue current dose of coumadin. Remind them to recheck INR in 1 week. Thank you.

## 2020-01-18 DIAGNOSIS — I509 Heart failure, unspecified: Secondary | ICD-10-CM | POA: Diagnosis not present

## 2020-01-18 DIAGNOSIS — N189 Chronic kidney disease, unspecified: Secondary | ICD-10-CM | POA: Diagnosis not present

## 2020-01-18 DIAGNOSIS — F419 Anxiety disorder, unspecified: Secondary | ICD-10-CM | POA: Diagnosis not present

## 2020-01-18 DIAGNOSIS — J9611 Chronic respiratory failure with hypoxia: Secondary | ICD-10-CM | POA: Diagnosis not present

## 2020-01-18 DIAGNOSIS — I130 Hypertensive heart and chronic kidney disease with heart failure and stage 1 through stage 4 chronic kidney disease, or unspecified chronic kidney disease: Secondary | ICD-10-CM | POA: Diagnosis not present

## 2020-01-18 DIAGNOSIS — I4891 Unspecified atrial fibrillation: Secondary | ICD-10-CM | POA: Diagnosis not present

## 2020-01-23 DIAGNOSIS — F419 Anxiety disorder, unspecified: Secondary | ICD-10-CM | POA: Diagnosis not present

## 2020-01-23 DIAGNOSIS — I509 Heart failure, unspecified: Secondary | ICD-10-CM | POA: Diagnosis not present

## 2020-01-23 DIAGNOSIS — J9611 Chronic respiratory failure with hypoxia: Secondary | ICD-10-CM | POA: Diagnosis not present

## 2020-01-23 DIAGNOSIS — N189 Chronic kidney disease, unspecified: Secondary | ICD-10-CM | POA: Diagnosis not present

## 2020-01-23 DIAGNOSIS — I4891 Unspecified atrial fibrillation: Secondary | ICD-10-CM | POA: Diagnosis not present

## 2020-01-23 DIAGNOSIS — I130 Hypertensive heart and chronic kidney disease with heart failure and stage 1 through stage 4 chronic kidney disease, or unspecified chronic kidney disease: Secondary | ICD-10-CM | POA: Diagnosis not present

## 2020-01-24 DIAGNOSIS — F419 Anxiety disorder, unspecified: Secondary | ICD-10-CM | POA: Diagnosis not present

## 2020-01-24 DIAGNOSIS — I4891 Unspecified atrial fibrillation: Secondary | ICD-10-CM | POA: Diagnosis not present

## 2020-01-24 DIAGNOSIS — J9611 Chronic respiratory failure with hypoxia: Secondary | ICD-10-CM | POA: Diagnosis not present

## 2020-01-24 DIAGNOSIS — I130 Hypertensive heart and chronic kidney disease with heart failure and stage 1 through stage 4 chronic kidney disease, or unspecified chronic kidney disease: Secondary | ICD-10-CM | POA: Diagnosis not present

## 2020-01-24 DIAGNOSIS — I509 Heart failure, unspecified: Secondary | ICD-10-CM | POA: Diagnosis not present

## 2020-01-24 DIAGNOSIS — N189 Chronic kidney disease, unspecified: Secondary | ICD-10-CM | POA: Diagnosis not present

## 2020-01-24 LAB — PROTIME-INR: INR: 1.7 — AB (ref 0.9–1.1)

## 2020-01-25 DIAGNOSIS — F419 Anxiety disorder, unspecified: Secondary | ICD-10-CM | POA: Diagnosis not present

## 2020-01-25 DIAGNOSIS — N189 Chronic kidney disease, unspecified: Secondary | ICD-10-CM | POA: Diagnosis not present

## 2020-01-25 DIAGNOSIS — J9611 Chronic respiratory failure with hypoxia: Secondary | ICD-10-CM | POA: Diagnosis not present

## 2020-01-25 DIAGNOSIS — I4891 Unspecified atrial fibrillation: Secondary | ICD-10-CM | POA: Diagnosis not present

## 2020-01-25 DIAGNOSIS — I130 Hypertensive heart and chronic kidney disease with heart failure and stage 1 through stage 4 chronic kidney disease, or unspecified chronic kidney disease: Secondary | ICD-10-CM | POA: Diagnosis not present

## 2020-01-25 DIAGNOSIS — I509 Heart failure, unspecified: Secondary | ICD-10-CM | POA: Diagnosis not present

## 2020-01-30 DIAGNOSIS — I509 Heart failure, unspecified: Secondary | ICD-10-CM | POA: Diagnosis not present

## 2020-01-30 DIAGNOSIS — I4891 Unspecified atrial fibrillation: Secondary | ICD-10-CM | POA: Diagnosis not present

## 2020-01-30 DIAGNOSIS — F419 Anxiety disorder, unspecified: Secondary | ICD-10-CM | POA: Diagnosis not present

## 2020-01-30 DIAGNOSIS — J9611 Chronic respiratory failure with hypoxia: Secondary | ICD-10-CM | POA: Diagnosis not present

## 2020-01-30 DIAGNOSIS — N189 Chronic kidney disease, unspecified: Secondary | ICD-10-CM | POA: Diagnosis not present

## 2020-01-30 DIAGNOSIS — I130 Hypertensive heart and chronic kidney disease with heart failure and stage 1 through stage 4 chronic kidney disease, or unspecified chronic kidney disease: Secondary | ICD-10-CM | POA: Diagnosis not present

## 2020-01-31 DIAGNOSIS — I509 Heart failure, unspecified: Secondary | ICD-10-CM | POA: Diagnosis not present

## 2020-01-31 DIAGNOSIS — I130 Hypertensive heart and chronic kidney disease with heart failure and stage 1 through stage 4 chronic kidney disease, or unspecified chronic kidney disease: Secondary | ICD-10-CM | POA: Diagnosis not present

## 2020-01-31 DIAGNOSIS — I4891 Unspecified atrial fibrillation: Secondary | ICD-10-CM | POA: Diagnosis not present

## 2020-01-31 DIAGNOSIS — F419 Anxiety disorder, unspecified: Secondary | ICD-10-CM | POA: Diagnosis not present

## 2020-01-31 DIAGNOSIS — N189 Chronic kidney disease, unspecified: Secondary | ICD-10-CM | POA: Diagnosis not present

## 2020-01-31 DIAGNOSIS — J9611 Chronic respiratory failure with hypoxia: Secondary | ICD-10-CM | POA: Diagnosis not present

## 2020-02-01 ENCOUNTER — Telehealth (INDEPENDENT_AMBULATORY_CARE_PROVIDER_SITE_OTHER): Payer: Self-pay

## 2020-02-01 ENCOUNTER — Ambulatory Visit (INDEPENDENT_AMBULATORY_CARE_PROVIDER_SITE_OTHER): Payer: Self-pay | Admitting: Nurse Practitioner

## 2020-02-01 LAB — POCT INR: INR: 2.6 (ref 2–3)

## 2020-02-01 NOTE — Telephone Encounter (Signed)
Rubys INR is 2.6 on 02/01/2020

## 2020-02-01 NOTE — Telephone Encounter (Signed)
Continue with the same dose of Coumadin/warfarin.

## 2020-02-01 NOTE — Telephone Encounter (Signed)
Daughter is aware of results and recommendation

## 2020-02-08 ENCOUNTER — Telehealth (INDEPENDENT_AMBULATORY_CARE_PROVIDER_SITE_OTHER): Payer: Self-pay | Admitting: Nurse Practitioner

## 2020-02-08 DIAGNOSIS — Z7901 Long term (current) use of anticoagulants: Secondary | ICD-10-CM | POA: Diagnosis not present

## 2020-02-08 LAB — POCT INR: INR: 2.4 (ref 2–3)

## 2020-02-08 NOTE — Telephone Encounter (Signed)
Okay we can try again next week if they do not call us back.

## 2020-02-08 NOTE — Telephone Encounter (Signed)
I tried to reach patient by home phone as well as Dennises phone and cant reach anyone

## 2020-02-08 NOTE — Telephone Encounter (Signed)
Rubys INR today is 2.4

## 2020-02-08 NOTE — Telephone Encounter (Signed)
Serena Colonel, Will you call this patient (her caregiver) and ask if her INR was checked today? I think last time it was checked was last week so it should be due today. If so, ask her what the level was. If it is between 2.0 and 3.0 please let them know that no change to medication regimen recommended. If INR is outside of that range, please let them know we will call them back with further instructions. Thank you.

## 2020-02-15 ENCOUNTER — Telehealth (INDEPENDENT_AMBULATORY_CARE_PROVIDER_SITE_OTHER): Payer: Self-pay | Admitting: Nurse Practitioner

## 2020-02-15 NOTE — Telephone Encounter (Signed)
I have not seen this patient's INR come through via fax this week, will you call this patient's caregiver and ask what her INR level was?

## 2020-02-15 NOTE — Telephone Encounter (Signed)
I ended up calling this patient's caregiver and she tells me that they have not been able to check her INR this week. She will plan on checking it in Monday.

## 2020-02-16 ENCOUNTER — Other Ambulatory Visit (INDEPENDENT_AMBULATORY_CARE_PROVIDER_SITE_OTHER): Payer: Self-pay | Admitting: Internal Medicine

## 2020-02-19 DIAGNOSIS — Z7901 Long term (current) use of anticoagulants: Secondary | ICD-10-CM | POA: Diagnosis not present

## 2020-02-19 LAB — POCT INR: INR: 6.5 — AB (ref 2–3)

## 2020-02-19 NOTE — Telephone Encounter (Signed)
INR was checked it was 6.5 and she is aware to hold coumadin until Friday per Dr. Darnell Level

## 2020-02-20 ENCOUNTER — Telehealth (INDEPENDENT_AMBULATORY_CARE_PROVIDER_SITE_OTHER): Payer: Self-pay

## 2020-02-20 NOTE — Telephone Encounter (Signed)
[name] from hospice will discuss with the family to just stop warafrin and 81mg  Aspirin

## 2020-02-20 NOTE — Telephone Encounter (Signed)
Hospice needs to discuss this with the family.  We have already had a discussion regarding stopping warfarin completely.  If the family agreed to stop warfarin, I do not think she should go on to aspirin either.  However, if the family wants something, aspirin 81 mg daily would be appropriate.

## 2020-02-20 NOTE — Telephone Encounter (Signed)
Otila Kluver from Hospice is calling asking if they can switch [name] from [place] to 81 mg of Aspirin so they dont have to keep sticking her, please advise?

## 2020-02-22 ENCOUNTER — Other Ambulatory Visit (INDEPENDENT_AMBULATORY_CARE_PROVIDER_SITE_OTHER): Payer: Self-pay | Admitting: Internal Medicine

## 2020-02-22 MED ORDER — ESOMEPRAZOLE MAGNESIUM 40 MG PO CPDR: 40.0000 mg | DELAYED_RELEASE_CAPSULE | Freq: Two times a day (BID) | ORAL | 3 refills | Status: AC

## 2020-02-29 ENCOUNTER — Encounter (INDEPENDENT_AMBULATORY_CARE_PROVIDER_SITE_OTHER): Payer: Self-pay | Admitting: Nurse Practitioner

## 2020-02-29 ENCOUNTER — Telehealth (INDEPENDENT_AMBULATORY_CARE_PROVIDER_SITE_OTHER): Payer: Self-pay | Admitting: Nurse Practitioner

## 2020-02-29 NOTE — Telephone Encounter (Signed)
Per caregiver, warfarin has been discontinued.

## 2020-02-29 NOTE — Telephone Encounter (Signed)
This encounter was created in error - please disregard.

## 2020-02-29 NOTE — Telephone Encounter (Signed)
Will you call this patient's caregiver and ask what her INR was today? They may have discontinued warfarin as the patient is now hospice, this is fine but we need to know whether or not she is still taking her warfarin and if she is we need to know what her INR is now. Thank you.

## 2020-03-11 ENCOUNTER — Telehealth (INDEPENDENT_AMBULATORY_CARE_PROVIDER_SITE_OTHER): Payer: Self-pay

## 2020-03-11 NOTE — Telephone Encounter (Signed)
Please call [name] about removing the warfin from the house

## 2020-03-11 NOTE — Telephone Encounter (Signed)
Yes, I totally agree with stopping the warfarin now.

## 2020-03-11 NOTE — Telephone Encounter (Signed)
[name] would like to Stop warfarin? Pt is on Hospice.

## 2020-03-19 ENCOUNTER — Telehealth: Payer: Self-pay | Admitting: Cardiovascular Disease

## 2020-03-19 NOTE — Telephone Encounter (Signed)
°  Patient Consent for Virtual Visit         [name] has provided verbal consent on 03/19/2020 for a virtual visit (video or telephone).   CONSENT FOR VIRTUAL VISIT FOR:  [name]  By participating in this virtual visit I agree to the following:  I hereby voluntarily request, consent and authorize [facility] and its employed or contracted physicians, physician assistants, nurse practitioners or other licensed health care professionals (the Practitioner), to provide me with telemedicine health care services (the Services") as deemed necessary by the treating Practitioner. I acknowledge and consent to receive the Services by the Practitioner via telemedicine. I understand that the telemedicine visit will involve communicating with the Practitioner through live audiovisual communication technology and the disclosure of certain medical information by electronic transmission. I acknowledge that I have been given the opportunity to request an in-person assessment or other available alternative prior to the telemedicine visit and am voluntarily participating in the telemedicine visit.  I understand that I have the right to withhold or withdraw my consent to the use of telemedicine in the course of my care at any time, without affecting my right to future care or treatment, and that the Practitioner or I may terminate the telemedicine visit at any time. I understand that I have the right to inspect all information obtained and/or recorded in the course of the telemedicine visit and may receive copies of available information for a reasonable fee.  I understand that some of the potential risks of receiving the Services via telemedicine include:   Delay or interruption in medical evaluation due to technological equipment failure or disruption;  Information transmitted may not be sufficient (e.g. poor resolution of images) to allow for appropriate medical decision making by the Practitioner;  and/or   In rare instances, security protocols could fail, causing a breach of personal health information.  Furthermore, I acknowledge that it is my responsibility to provide information about my medical history, conditions and care that is complete and accurate to the best of my ability. I acknowledge that Practitioner's advice, recommendations, and/or decision may be based on factors not within their control, such as incomplete or inaccurate data provided by me or distortions of diagnostic images or specimens that may result from electronic transmissions. I understand that the practice of medicine is not an exact science and that Practitioner makes no warranties or guarantees regarding treatment outcomes. I acknowledge that a copy of this consent can be made available to me via my patient portal ([facility]), or I can request a printed copy by calling the office of [facility].    I understand that my insurance will be billed for this visit.   I have read or had this consent read to me.  I understand the contents of this consent, which adequately explains the benefits and risks of the Services being provided via telemedicine.   I have been provided ample opportunity to ask questions regarding this consent and the Services and have had my questions answered to my satisfaction.  I give my informed consent for the services to be provided through the use of telemedicine in my medical care

## 2020-03-20 ENCOUNTER — Encounter: Payer: Self-pay | Admitting: Cardiovascular Disease

## 2020-03-20 ENCOUNTER — Telehealth (INDEPENDENT_AMBULATORY_CARE_PROVIDER_SITE_OTHER): Admitting: Cardiovascular Disease

## 2020-03-20 VITALS — BP 112/67 | HR 76 | Ht 68.0 in

## 2020-03-20 DIAGNOSIS — N183 Chronic kidney disease, stage 3 unspecified: Secondary | ICD-10-CM

## 2020-03-20 DIAGNOSIS — Z515 Encounter for palliative care: Secondary | ICD-10-CM

## 2020-03-20 DIAGNOSIS — I5042 Chronic combined systolic (congestive) and diastolic (congestive) heart failure: Secondary | ICD-10-CM | POA: Diagnosis not present

## 2020-03-20 DIAGNOSIS — I25118 Atherosclerotic heart disease of native coronary artery with other forms of angina pectoris: Secondary | ICD-10-CM

## 2020-03-20 DIAGNOSIS — I4811 Longstanding persistent atrial fibrillation: Secondary | ICD-10-CM | POA: Diagnosis not present

## 2020-03-20 NOTE — Patient Instructions (Signed)
Medication Instructions:  Your physician recommends that you continue on your current medications as directed. Please refer to the Current Medication list given to you today.  *If you need a refill on your cardiac medications before your next appointment, please call your pharmacy*   Lab Work: None today If you have labs (blood work) drawn today and your tests are completely normal, you will receive your results only by: . MyChart Message (if you have MyChart) OR . A paper copy in the mail If you have any lab test that is abnormal or we need to change your treatment, we will call you to review the results.   Testing/Procedures: None today   Follow-Up: At [facility], you and your health needs are our priority.  As part of our continuing mission to provide you with exceptional heart care, we have created designated Provider Care Teams.  These Care Teams include your primary Cardiologist (physician) and Advanced Practice Providers (APPs -  Physician Assistants and Nurse Practitioners) who all work together to provide you with the care you need, when you need it.  We recommend signing up for the patient portal called "MyChart".  Sign up information is provided on this After Visit Summary.  MyChart is used to connect with patients for Virtual Visits (Telemedicine).  Patients are able to view lab/test results, encounter notes, upcoming appointments, etc.  Non-urgent messages can be sent to your provider as well.   To learn more about what you can do with MyChart, go to [email or web address].    Your next appointment:   3 month(s)  The format for your next appointment:   Virtual Visit   Provider:   Suresh Koneswaran, MD   Other Instructions None       Thank you for choosing [facility] !         

## 2020-03-20 NOTE — Progress Notes (Signed)
Virtual Visit via Telephone Note   This visit type was conducted due to national recommendations for restrictions regarding the COVID-19 Pandemic (e.g. social distancing) in an effort to limit this patient's exposure and mitigate transmission in our community.  Due to her co-morbid illnesses, this patient is at least at moderate risk for complications without adequate follow up.  This format is felt to be most appropriate for this patient at this time.  The patient did not have access to video technology/had technical difficulties with video requiring transitioning to audio format only (telephone).  All issues noted in this document were discussed and addressed.  No physical exam could be performed with this format.  Please refer to the patient's chart for her  consent to telehealth for [facility].   The patient was identified using 2 identifiers.  Date:  03/20/2020   ID:  Rennis Petty, DOB [id number], MRN [id number]  Patient Location: Home Provider Location: Office  PCP:  Doree Albee, MD  Cardiologist:  Kate Sable, MD  Electrophysiologist:  None   Evaluation Performed:  Follow-Up Visit  Chief Complaint: Arrhythmia, CHF  History of Present Illness:    [name] is a 79 y.o. female with a history of chronic diastolic heart failure, atrial flutter and fibrillation, and hypertension.  She was hospitalized in June 2021 with COVID-19 pneumonia and had acute systolic heart failure with a new cardiomyopathy, LVEF 35 to 40%.  She had some issues with rapid atrial fibrillation and metoprolol was titrated to 200 mg daily.  She had some acute kidney injury and digoxin was avoided and Aldactone was held.  I spoke with her niece, [name]. Her mother (Drema's sister), [name], is also my patient. She has an appt with me tomorrow.  We discussed the patient's and her family's struggles.  The patient is now under hospice care at home. She has lost a lot of weight and is  unable to stand. She eats once a day. She sleeps a lot.  Her PCP stopped warfarin.  [name] told me she herself was hospitalized for chest pain at [facility] and underwent an echocardiogram and stress test and was told that her symptoms were likely due to the stress of all that was going on with her aunt.   Past Medical History:  Diagnosis Date   Adenomatous polyps    -resected via colonoscopy in 2011   Anemia    Anxiety    MR   Atrial fibrillation (HCC)    CHF (congestive heart failure) (HCC)    Chronic kidney disease    Degenerative joint disease    status post right total hip arthroplasty   Dysrhythmia    GERD (gastroesophageal reflux disease)    -Barrett's esophagus   Glaucoma    Glaucoma    Gout    Hyperlipidemia    Hypertension    Mental retardation    Peripheral neuropathy 09/12/2019   Pneumonia    Premature ventricular contractions    Restless leg syndrome    Shortness of breath dyspnea    Wears dentures    Wears glasses    Past Surgical History:  Procedure Laterality Date   BIOPSY  02/22/2019   Procedure: BIOPSY;  Surgeon: Rogene Houston, MD;  [facility];  Service: Endoscopy;;  Esophageal biopies   CATARACT EXTRACTION  2006   left eye   COLONOSCOPY W/ POLYPECTOMY  2011   COLONOSCOPY WITH PROPOFOL N/A 02/22/2019   Procedure: COLONOSCOPY WITH PROPOFOL;  Surgeon: Rogene Houston, MD;  Location[facility];  Service: Endoscopy;  Laterality: N/A;   ESOPHAGOGASTRODUODENOSCOPY N/A 01/01/2016   Procedure: ESOPHAGOGASTRODUODENOSCOPY (EGD);  Surgeon: Rogene Houston, MD;  [facility];  Service: Endoscopy;  Laterality: N/A;  200   ESOPHAGOGASTRODUODENOSCOPY (EGD) WITH PROPOFOL N/A 02/22/2019   Procedure: ESOPHAGOGASTRODUODENOSCOPY (EGD) WITH PROPOFOL;  Surgeon: Rogene Houston, MD;  Location[facility];  Service: Endoscopy;  Laterality: N/A;   EYE SURGERY     JOINT REPLACEMENT     x2   MULTIPLE TOOTH  EXTRACTIONS     RADIOLOGY WITH ANESTHESIA Right 11/21/2015   Procedure: MRI OF RIGHT KNEE WITHOUT CONTAST    (RADIOLOGY WITH ANESTHESIA);  Surgeon: Medication Radiologist, MD;  Location: [facility];  Service: Radiology;  Laterality: Right;   RADIOLOGY WITH ANESTHESIA N/A 08/11/2018   Procedure: MRI of the BRAIN WITH ANESTHESIA WITHOUT CONTRAST;  Surgeon: Radiologist, Medication, MD;  Location: [place];  Service: Radiology;  Laterality: N/A;   TONSILLECTOMY     TOTAL HIP ARTHROPLASTY  2006   Right     Current Meds  Medication Sig   acetaminophen (TYLENOL) 325 MG tablet Take 2 tablets (650 mg total) by mouth every 6 (six) hours as needed for mild pain (or Fever >/= 101).   allopurinol (ZYLOPRIM) 100 MG tablet Take 100 mg by mouth at bedtime.    aspirin EC 81 MG tablet Take 81 mg by mouth daily.   Brinzolamide-Brimonidine (SIMBRINZA) 1-0.2 % SUSP Place 1 drop into both eyes 2 (two) times daily.    Cholecalciferol (VITAMIN D3) 5000 units CAPS Take 5,000 Units by mouth daily.    esomeprazole (NEXIUM) 40 MG capsule Take 1 capsule (40 mg total) by mouth 2 (two) times daily before a meal.   ferrous gluconate (FERGON) 324 MG tablet Take 1 tablet (324 mg total) by mouth 2 (two) times daily with a meal.   fluticasone (FLONASE) 50 MCG/ACT nasal spray Place 1 spray into both nostrils daily.   gabapentin (NEURONTIN) 300 MG capsule Take 300 mg at bedtime by mouth.   Ipratropium-Albuterol (COMBIVENT) 20-100 MCG/ACT AERS respimat Inhale 1 puff into the lungs every 6 (six) hours as needed for wheezing or shortness of breath.   Lactobacillus (ACIDOPHILUS) CAPS capsule TAKE TWO TABLETS BY MOUTH TWICE DAILY CHECK OTC SHELF   latanoprost (XALATAN) 0.005 % ophthalmic solution Place 1 drop into both eyes at bedtime.     loratadine (CLARITIN) 10 MG tablet Take 10 mg by mouth daily.    LORazepam (ATIVAN) 0.5 MG tablet Take 1 tablet (0.5 mg total) by mouth 2 (two) times daily as needed for anxiety.    meclizine (ANTIVERT) 12.5 MG tablet Take 12.5 mg by mouth 2 (two) times daily as needed for dizziness.    metoprolol (TOPROL XL) 200 MG 24 hr tablet Take 1 tablet (200 mg total) by mouth See admin instructions. Take along with Toprol-XL 37.5 mg daily for a total of 237.5 mg daily   metoprolol succinate (TOPROL-XL) 25 MG 24 hr tablet Take 1.5 tablets (37.5 mg total) by mouth See admin instructions. Take along with Toprol-XL 200 mg daily for a total of 237.5 mg daily   Morphine Sulfate (MORPHINE CONCENTRATE) 10 mg / 0.5 ml concentrated solution SMARTSIG:0.25 Milliliter(s) By Mouth Every Hour PRN   neomycin-polymyxin-hydrocortisone (CORTISPORIN) OTIC solution INSTILL 2-3 DROPS IN EACH EAR TWICE DAILY (Patient taking differently: Place 2-3 drops into both ears 2 (two) times daily as needed (for ear pain). )  ondansetron (ZOFRAN-ODT) 4 MG disintegrating tablet TAKE 1 TABLET BY MOUTH EVERY 4 TO 6 HOURS AS NEEDED (Patient taking differently: Take 4 mg by mouth every 4 (four) hours as needed for nausea or vomiting. )   saccharomyces boulardii (FLORASTOR) 250 MG capsule Take 1 capsule (250 mg total) by mouth 2 (two) times daily.   torsemide (DEMADEX) 20 MG tablet Take 1 tablet (20 mg total) by mouth every Monday, Wednesday, and Friday. (Patient taking differently: Take 20 mg by mouth as directed. Takes as needed per daughter)   [DISCONTINUED] ascorbic acid (VITAMIN C) 500 MG tablet Take 1 tablet (500 mg total) by mouth daily.   [DISCONTINUED] magic mouthwash w/lidocaine SOLN Take 5 mLs by mouth 3 (three) times daily as needed for mouth pain.   [DISCONTINUED] NP THYROID 90 MG tablet Take 1 tablet by mouth once daily (Patient taking differently: Take 90 mg by mouth every morning. )     Allergies:   Patient has no known allergies.   Social History   Tobacco Use   Smoking status: Never Smoker   Smokeless tobacco: Never Used  Substance Use Topics   Alcohol use: No   Drug use: No     Family  Hx: The patient's family history includes Hypertension in her mother.  ROS:   Please see the history of present illness.     All other systems reviewed and are negative.   Prior CV studies:   The following studies were reviewed today:  Echocardiogram 11/09/2019:  1. Left ventricular ejection fraction, by visual estimation, is  approximately 35 to 40%. The left ventricle has moderately decreased  function. There is mildly increased left ventricular wall thickness.  Images are limited and not all myocardial segments  visualized.  2. The left ventricle demonstrates global hypokinesis.  3. Left ventricular diastolic parameters are indeterminate in the setting  of atrial fibrillation.  4. Global right ventricle has mildly reduced systolic function.The right  ventricular size is normal. no increase in right ventricular wall  thickness.  5. Left atrial size was moderately dilated.  6. Presence of pericardial fat pad.  7. The mitral valve is grossly normal. Trivial mitral valve  regurgitation.  8. The tricuspid valve was grossly normal. Tricuspid valve regurgitation  is mild.  9. Tricuspid valve regurgitation is mild.  10. TR signal is inadequate for assessing pulmonary artery systolic  pressure.  11. The inferior vena cava is normal in size with <50% respiratory  variability, suggesting right atrial pressure of 8 mmHg.  12. No obvious valvular vegetations.     She underwent a low risk nuclear stress test on 08/19/2018, EF 51%. There appeared to be a small distal anteroseptal myocardial infarction with no evidence of ischemia.    Labs/Other Tests and Data Reviewed:    EKG:  No ECG reviewed.  Recent Labs: 11/07/2019: TSH 0.016 11/22/2019: B Natriuretic Peptide 563.0 11/27/2019: ALT 6; BUN 64; Creatinine, Ser 1.85; Hemoglobin 7.8; Magnesium 1.8; Platelets 129; Potassium 5.0; Sodium 139   Recent Lipid Panel Lab Results  Component Value Date/Time   CHOL 132 07/27/2009  12:00 AM   TRIG 143 11/23/2019 12:40 AM   HDL 43 07/27/2009 12:00 AM   LDLCALC 75 07/27/2009 12:00 AM    Wt Readings from Last 3 Encounters:  11/27/19 177 lb 11.1 oz (80.6 kg)  11/17/19 186 lb 15.2 oz (84.8 kg)  08/04/19 181 lb (82.1 kg)     Objective:    Vital Signs:  BP 112/67  Pulse 76    Ht 5\' 8"  (1.727 m)    BMI 27.02 kg/m    VITAL SIGNS:  reviewed  ASSESSMENT & PLAN:    1. Chronic combined heart failure:Most recent echocardiogram reviewed above with LVEF 35-40%. Currently on Toprol-XL 237.5 mg daily.  She is on torsemide and takes it as needed. She is under hospice care.  2.Longstanding persistent atrial fibrillation/flutter:Symptomatically stable on Toprol-XL 237.5 mg daily.  No longer on warfarin and is now on aspirin. Her PCP stopped warfarin.  3. Chronic kidney disease stage HXT:AVWPV function reviewed above. Monitored by PCP.  4. Coronary artery disease:Nuclear stress test reviewed above with evidence of small prior MI. Currently on Toprol-XL and aspirin.    COVID-19 Education: The signs and symptoms of COVID-19 were discussed with the patient and how to seek care for testing (follow up with PCP or arrange E-visit).  The importance of social distancing was discussed today.  Time:   Today, I have spent 20 minutes with the patient with telehealth technology discussing the above problems.     Medication Adjustments/Labs and Tests Ordered: Current medicines are reviewed at length with the patient today.  Concerns regarding medicines are outlined above.   Tests Ordered: No orders of the defined types were placed in this encounter.   Medication Changes: No orders of the defined types were placed in this encounter.   Follow Up:  Virtual Visit 3 months   Signed, Kate Sable, MD  03/20/2020 9:31 AM    [place]

## 2020-04-02 ENCOUNTER — Telehealth: Payer: Self-pay | Admitting: Cardiovascular Disease

## 2020-04-02 NOTE — Telephone Encounter (Signed)
Pt's family called and wanted Dr. Bronson Ing to know she passed away yesterday

## 2020-04-02 NOTE — Telephone Encounter (Signed)
Dr.Koneswaran out of office, will forward to him

## 2020-04-02 DEATH — deceased

## 2020-04-25 NOTE — Telephone Encounter (Signed)
I spoke with sister and relayed message,she was thankful

## 2020-04-25 NOTE — Telephone Encounter (Signed)
Please convey my condolences to them. I am so very sorry. It was such a pleasure to participate in her care.

## 2020-07-03 ENCOUNTER — Telehealth: Payer: Medicare Other | Admitting: Cardiovascular Disease

## 2021-04-21 IMAGING — DX DG ABDOMEN ACUTE W/ 1V CHEST
4 series · 4 of 4 positions shown · non-contrast
Comparison: 11/13/2019 chest x-ray, 11/07/2019

CLINICAL DATA: Abdomen pain

EXAM:
DG ABDOMEN ACUTE W/ 1V CHEST

[chest ap]
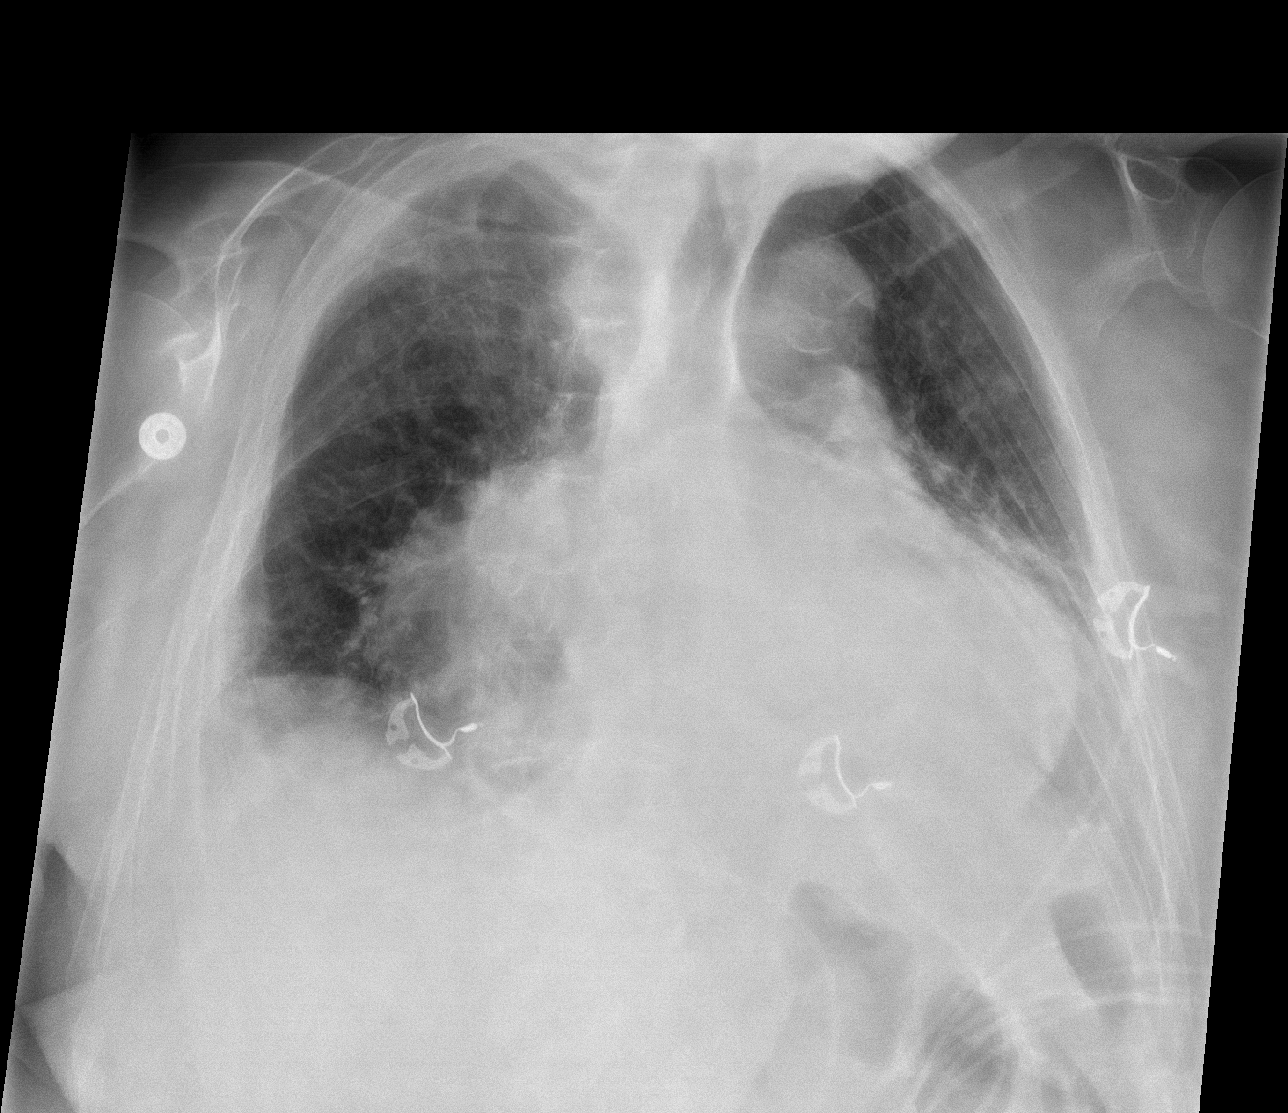

[abdomen erect]
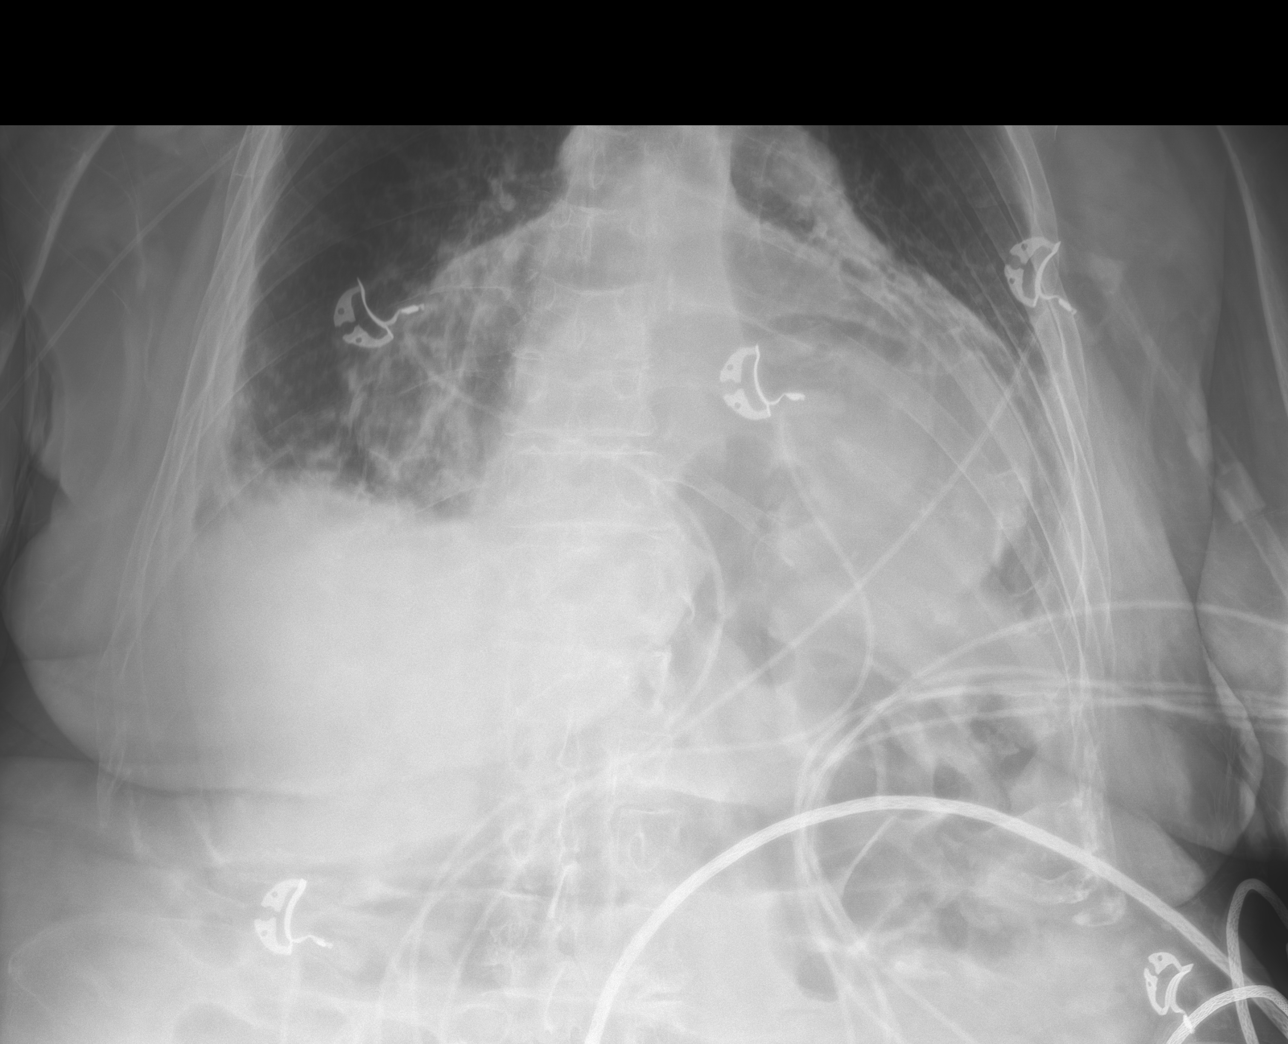

[abdomen supine grid (1 of 2)]
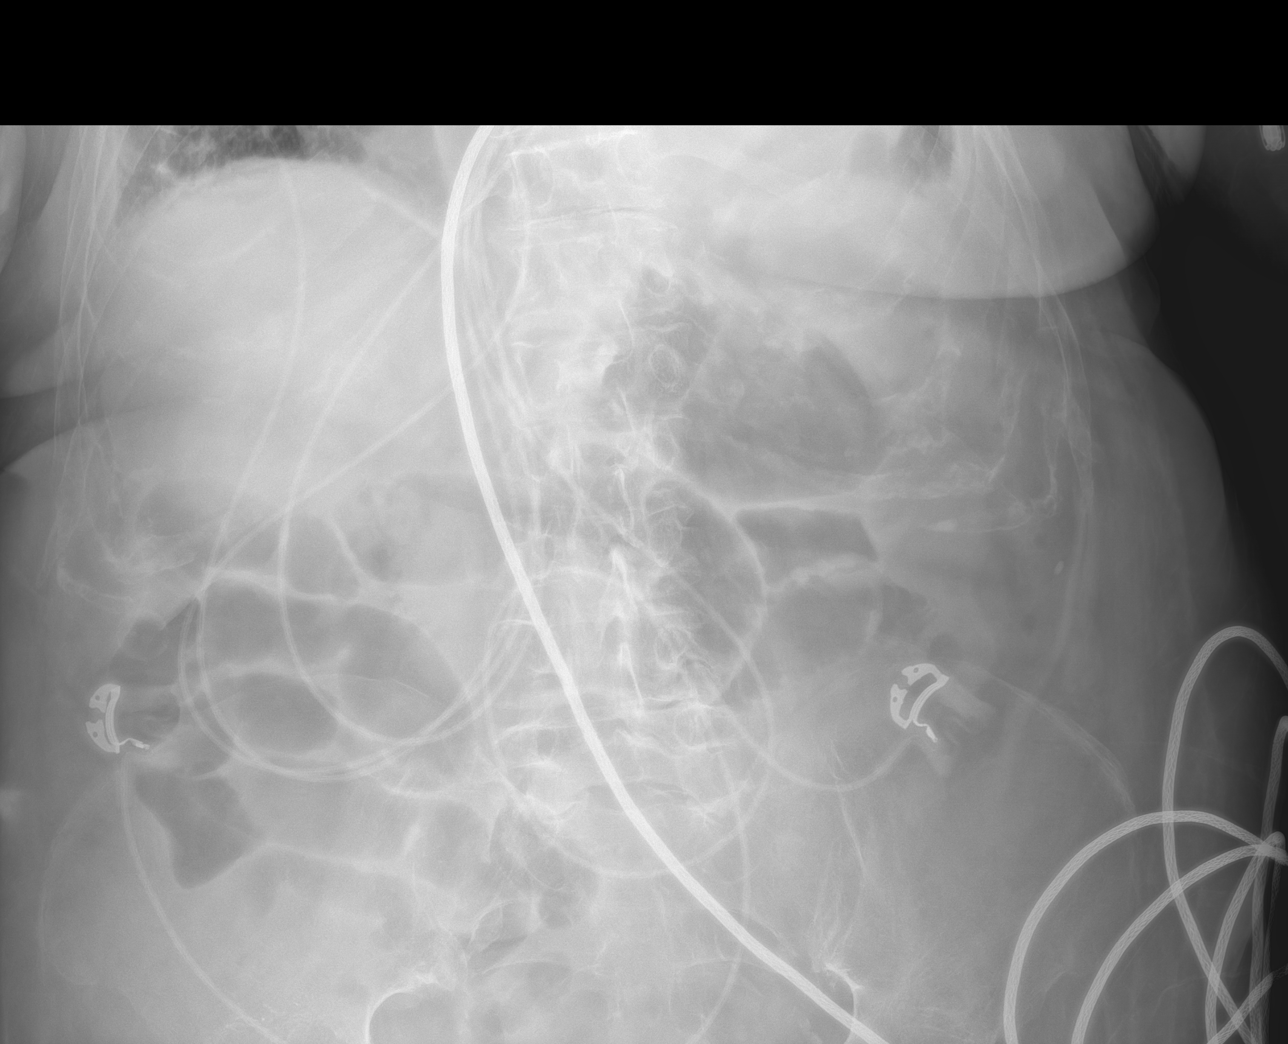

[abdomen supine grid (2 of 2)]
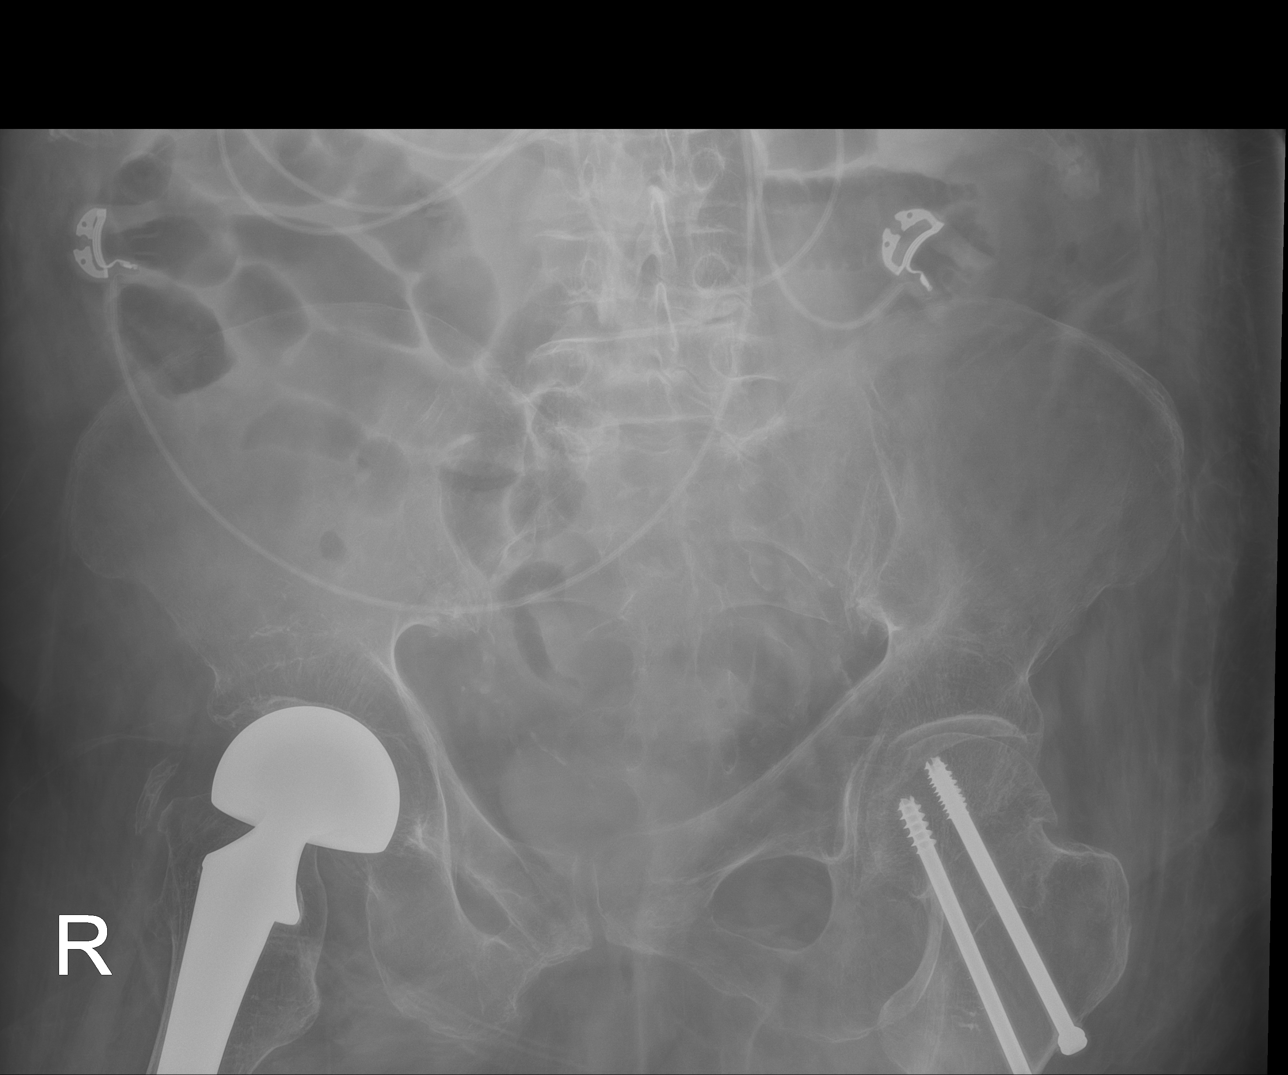

[4 of 4 positions shown; findings below may reference images not displayed]

FINDINGS: Single view chest demonstrates cardiac enlargement. Large hiatal
hernia. Aortic atherosclerosis. Patchy foci of airspace disease at
the periphery of the lung bases with overall improved aeration.

Supine and upright views of the abdomen demonstrate no free air
beneath the diaphragm. Gas-filled loops of borderline to mildly
distended central small bowel with relative absence of distal and
colon bowel gas. Postsurgical changes of both hips
IMPRESSION: 1. Small amount of airspace disease at the periphery of the right
base with overall improved aeration since 11/13/2019. Enlarged
cardiac silhouette. Large hiatal hernia
2. Air-filled borderline to mildly distended central small bowel
with relative absence of colon gas, mild or developing bowel
obstruction is considered.
